# Patient Record
Sex: Male | Born: 1950
Health system: Southern US, Community
[De-identification: ages and names within clinical notes are randomized; demographics above are authoritative.]

## PROBLEM LIST (undated history)

## (undated) DIAGNOSIS — H9190 Unspecified hearing loss, unspecified ear: Secondary | ICD-10-CM

## (undated) DIAGNOSIS — I8390 Asymptomatic varicose veins of unspecified lower extremity: Secondary | ICD-10-CM

## (undated) DIAGNOSIS — Z9289 Personal history of other medical treatment: Secondary | ICD-10-CM

## (undated) DIAGNOSIS — I10 Essential (primary) hypertension: Secondary | ICD-10-CM

## (undated) DIAGNOSIS — M25519 Pain in unspecified shoulder: Secondary | ICD-10-CM

## (undated) DIAGNOSIS — J189 Pneumonia, unspecified organism: Secondary | ICD-10-CM

## (undated) DIAGNOSIS — E785 Hyperlipidemia, unspecified: Secondary | ICD-10-CM

## (undated) DIAGNOSIS — D179 Benign lipomatous neoplasm, unspecified: Secondary | ICD-10-CM

## (undated) DIAGNOSIS — G8929 Other chronic pain: Secondary | ICD-10-CM

## (undated) DIAGNOSIS — D333 Benign neoplasm of cranial nerves: Secondary | ICD-10-CM

## (undated) HISTORY — DX: Asymptomatic varicose veins of unspecified lower extremity: I83.90

## (undated) HISTORY — DX: Other chronic pain: G89.29

## (undated) HISTORY — DX: Benign lipomatous neoplasm, unspecified: D17.9

## (undated) HISTORY — DX: Pain in unspecified shoulder: M25.519

## (undated) HISTORY — PX: COLONOSCOPY: SHX174

## (undated) HISTORY — PX: WISDOM TOOTH EXTRACTION: SHX21

## (undated) HISTORY — PX: APPENDECTOMY: SHX54

## (undated) HISTORY — DX: Unspecified hearing loss, unspecified ear: H91.90

## (undated) HISTORY — DX: Personal history of other medical treatment: Z92.89

## (undated) HISTORY — DX: Essential (primary) hypertension: I10

---

## 1999-04-01 ENCOUNTER — Encounter: Payer: Self-pay | Admitting: Orthopedic Surgery

## 1999-04-01 ENCOUNTER — Ambulatory Visit (HOSPITAL_COMMUNITY): Admission: RE | Admit: 1999-04-01 | Discharge: 1999-04-01 | Payer: Self-pay | Admitting: Orthopedic Surgery

## 1999-08-17 ENCOUNTER — Emergency Department (HOSPITAL_COMMUNITY): Admission: EM | Admit: 1999-08-17 | Discharge: 1999-08-17 | Payer: Self-pay | Admitting: *Deleted

## 1999-10-21 ENCOUNTER — Emergency Department (HOSPITAL_COMMUNITY): Admission: EM | Admit: 1999-10-21 | Discharge: 1999-10-21 | Payer: Self-pay | Admitting: Emergency Medicine

## 2000-03-15 HISTORY — PX: LUMBAR DISC SURGERY: SHX700

## 2000-10-14 ENCOUNTER — Encounter: Admission: RE | Admit: 2000-10-14 | Discharge: 2000-10-14 | Payer: Self-pay | Admitting: Endocrinology

## 2000-10-14 ENCOUNTER — Encounter: Payer: Self-pay | Admitting: Endocrinology

## 2005-11-01 ENCOUNTER — Ambulatory Visit (HOSPITAL_COMMUNITY): Admission: RE | Admit: 2005-11-01 | Discharge: 2005-11-01 | Payer: Self-pay | Admitting: *Deleted

## 2010-07-31 ENCOUNTER — Inpatient Hospital Stay (INDEPENDENT_AMBULATORY_CARE_PROVIDER_SITE_OTHER)
Admission: RE | Admit: 2010-07-31 | Discharge: 2010-07-31 | Disposition: A | Discharge status: Home / Self Care | Payer: Self-pay | Source: Ambulatory Visit | Attending: Family Medicine | Admitting: Family Medicine

## 2010-07-31 DIAGNOSIS — B356 Tinea cruris: Secondary | ICD-10-CM

## 2010-07-31 DIAGNOSIS — M779 Enthesopathy, unspecified: Secondary | ICD-10-CM

## 2011-04-14 ENCOUNTER — Ambulatory Visit (INDEPENDENT_AMBULATORY_CARE_PROVIDER_SITE_OTHER): Payer: Self-pay | Admitting: Medical

## 2011-04-14 ENCOUNTER — Encounter: Payer: Self-pay | Admitting: Medical

## 2011-04-14 VITALS — BP 110/70 | HR 62 | Temp 98.2°F | Resp 16 | Ht 68.0 in | Wt 198.0 lb

## 2011-04-14 DIAGNOSIS — M79609 Pain in unspecified limb: Secondary | ICD-10-CM

## 2011-04-14 DIAGNOSIS — M79672 Pain in left foot: Secondary | ICD-10-CM | POA: Insufficient documentation

## 2011-04-14 MED ORDER — NAPROXEN SODIUM ER 750 MG PO TB24: 1.0000 | ORAL_TABLET | Freq: Every day | ORAL | Status: DC

## 2011-04-14 NOTE — Progress Notes (Signed)
Subjective:   HPI  Logan Phillips is a 61 y.o. male who presents as a new patient today.  Was seeing [facility] prior but it was too far to keep driving there.  He reports pain of left foot for 3-4 days.  Pain around the bottom of his feet, worse with putting pressure on this or walking.  Pain at night too.  He says it even hurts for sheet to touch the foot.  Denies injury or trauma.  He works in Environmental manager.   His friends who have gout thought this may be gout.  Friend gave him some Hydrocodone to use which helped.  Using Aleve OTC.  Of note, he says he was drinking 8-9 beers nightly for the past 8+months, but after his recent physical 12/12 with elevated liver tests, he cut back ETOH intake to 3-4 beers per day.  Denies prior similar symptoms.  No other aggravating or relieving factors.    No other c/o.  The following portions of the patient's history were reviewed and updated as appropriate: allergies, current medications, past family history, past medical history, past social history, past surgical history and problem list.  Past Medical History  Diagnosis Date  . Hypertension     No Known Allergies  No current outpatient prescriptions on file prior to visit.    Review of Systems Constitutional: denies fever, chills, sweats, unexpected weight change, fatigue Cardiology: denies chest pain, palpitations, edema Respiratory: denies cough, shortness of breath, wheezing Gastroenterology: denies abdominal pain, nausea, vomiting, diarrhea, constipation Musculoskeletal: denies myalgias, joint swelling, back pain Urology: denies dysuria, difficulty urinating, hematuria, urinary frequency, urgency Neurology: no headache, weakness, tingling, numbness      Objective:   Physical Exam  General appearance: alert, no distress, WD/WN Heart: RRR, normal S1, S2, no murmurs Lungs: CTA bilaterally, no wheezes, rhonchi, or rales MSK: left great toe MTP with generalized tenderness, tender  along 1st left metatarsal, but no obvious swelling, no erythema, no bony changes, otherwise foot nontender, rest of LE exam unremarkable Pulses: 2+ symmetric, upper and lower extremities, normal cap refill   Assessment and Plan :     Encounter Diagnosis  Name Primary?  Marland Kitchen Foot pain, left Yes   We discussed his symptoms and exam findings.  He may have gout or pseudogout, but exam not completely specific and he wasn't as tender as would be expected.   Other etiologies could include arthritis, plantar fascitis, etc.  We will treat empirically for gout currently.  Gave samples of Naprelan 875 mg daily for 3 days.  He will call back Friday if not improving.  Consider Indocin, prednisone or other NSAID at that time.  Advised he cut down even more on ETOH use, rest, can use ice if desired.  He signed records release so we can get records from prior [facility].  If he continues to get similar flare ups, we will consider additional workup for gout including xray.

## 2011-04-14 NOTE — Patient Instructions (Signed)
Gout Gout is an inflammatory condition (arthritis) caused by a buildup of uric acid crystals in the joints. Uric acid is a chemical that is normally present in the blood. Under some circumstances, uric acid can form into crystals in your joints. This causes joint redness, soreness, and swelling (inflammation). Repeat attacks are common. Over time, uric acid crystals can form into masses (tophi) near a joint, causing disfigurement. Gout is treatable and often preventable. CAUSES  The disease begins with elevated levels of uric acid in the blood. Uric acid is produced by your body when it breaks down a naturally found substance called purines. This also happens when you eat certain foods such as meats and fish. Causes of an elevated uric acid level include:  Being passed down from parent to child (heredity).   Diseases that cause increased uric acid production (obesity, psoriasis, some cancers).   Excessive alcohol use.   Diet, especially diets rich in meat and seafood.   Medicines, including certain cancer-fighting drugs (chemotherapy), diuretics, and aspirin.   Chronic kidney disease. The kidneys are no longer able to remove uric acid well.   Problems with metabolism.  Conditions strongly associated with gout include:  Obesity.   High blood pressure.   High cholesterol.   Diabetes.  Not everyone with elevated uric acid levels gets gout. It is not understood why some people get gout and others do not. Surgery, joint injury, and eating too much of certain foods are some of the factors that can lead to gout. SYMPTOMS   An attack of gout comes on quickly. It causes intense pain with redness, swelling, and warmth in a joint.   Fever can occur.   Often, only one joint is involved. Certain joints are more commonly involved:   Base of the big toe.   Knee.   Ankle.   Wrist.   Finger.  Without treatment, an attack usually goes away in a few days to weeks. Between attacks, you  usually will not have symptoms, which is different from many other forms of arthritis. DIAGNOSIS  Your caregiver will suspect gout based on your symptoms and exam. Removal of fluid from the joint (arthrocentesis) is done to check for uric acid crystals. Your caregiver will give you a medicine that numbs the area (local anesthetic) and use a needle to remove joint fluid for exam. Gout is confirmed when uric acid crystals are seen in joint fluid, using a special microscope. Sometimes, blood, urine, and X-ray tests are also used. TREATMENT  There are 2 phases to gout treatment: treating the sudden onset (acute) attack and preventing attacks (prophylaxis). Treatment of an Acute Attack  Medicines are used. These include anti-inflammatory medicines or steroid medicines.   An injection of steroid medicine into the affected joint is sometimes necessary.   The painful joint is rested. Movement can worsen the arthritis.   You may use warm or cold treatments on painful joints, depending which works best for you.   Discuss the use of coffee, vitamin C, or cherries with your caregiver. These may be helpful treatment options.  Treatment to Prevent Attacks After the acute attack subsides, your caregiver may advise prophylactic medicine. These medicines either help your kidneys eliminate uric acid from your body or decrease your uric acid production. You may need to stay on these medicines for a very long time. The early phase of treatment with prophylactic medicine can be associated with an increase in acute gout attacks. For this reason, during the first few months   of treatment, your caregiver may also advise you to take medicines usually used for acute gout treatment. Be sure you understand your caregiver's directions. You should also discuss dietary treatment with your caregiver. Certain foods such as meats and fish can increase uric acid levels. Other foods such as dairy can decrease levels. Your caregiver  can give you a list of foods to avoid. HOME CARE INSTRUCTIONS   Do not take aspirin to relieve pain. This raises uric acid levels.   Only take over-the-counter or prescription medicines for pain, discomfort, or fever as directed by your caregiver.   Rest the joint as much as possible. When in bed, keep sheets and blankets off painful areas.   Keep the affected joint raised (elevated).   Use crutches if the painful joint is in your leg.   Drink enough water and fluids to keep your urine clear or pale yellow. This helps your body get rid of uric acid. Do not drink alcoholic beverages. They slow the passage of uric acid.   Follow your caregiver's dietary instructions. Pay careful attention to the amount of protein you eat. Your daily diet should emphasize fruits, vegetables, whole grains, and fat-free or low-fat milk products.   Maintain a healthy body weight.  SEEK MEDICAL CARE IF:   You have an oral temperature above 102 F (38.9 C).   You develop diarrhea, vomiting, or any side effects from medicines.   You do not feel better in 24 hours, or you are getting worse.  SEEK IMMEDIATE MEDICAL CARE IF:   Your joint becomes suddenly more tender and you have:   Chills.   An oral temperature above 102 F (38.9 C), not controlled by medicine.  MAKE SURE YOU:   Understand these instructions.   Will watch your condition.   Will get help right away if you are not doing well or get worse.  Document Released: 02/27/2000 Document Revised: 11/11/2010 Document Reviewed: 06/09/2009 [employer] Patient Information 2012 [employer]. 

## 2011-04-27 ENCOUNTER — Telehealth: Payer: Self-pay | Admitting: Medical

## 2011-04-27 NOTE — Telephone Encounter (Signed)
Needs refill on toprol xl 50 mg and lisinopril 20 mg

## 2011-04-28 ENCOUNTER — Other Ambulatory Visit: Payer: Self-pay | Admitting: Medical

## 2011-04-28 MED ORDER — LISINOPRIL 20 MG PO TABS: 20.0000 mg | ORAL_TABLET | Freq: Every day | ORAL | Status: DC

## 2011-04-28 MED ORDER — METOPROLOL SUCCINATE ER 50 MG PO TB24: 50.0000 mg | ORAL_TABLET | Freq: Every day | ORAL | Status: DC

## 2011-04-28 NOTE — Telephone Encounter (Signed)
i sent toprol, but from his first visit, i don't see where he was on lisinopril.  Pls clarify?

## 2011-04-28 NOTE — Telephone Encounter (Signed)
PATIENT STATES THAT HE DOES TAKE LISNOPRIL 20 MG. HE HAS TAKEN IT FOR YEARS. I PUT HIS CHART IN YOUR OFFICE. CLS

## 2011-05-19 ENCOUNTER — Emergency Department (HOSPITAL_COMMUNITY)
Admission: EM | Admit: 2011-05-19 | Discharge: 2011-05-19 | Disposition: A | Discharge status: Home / Self Care | Payer: Self-pay | Attending: Emergency Medicine | Admitting: Emergency Medicine

## 2011-05-19 ENCOUNTER — Encounter (HOSPITAL_COMMUNITY): Payer: Self-pay | Admitting: Emergency Medicine

## 2011-05-19 ENCOUNTER — Emergency Department (HOSPITAL_COMMUNITY): Payer: Self-pay

## 2011-05-19 DIAGNOSIS — M79609 Pain in unspecified limb: Secondary | ICD-10-CM | POA: Insufficient documentation

## 2011-05-19 DIAGNOSIS — R209 Unspecified disturbances of skin sensation: Secondary | ICD-10-CM | POA: Insufficient documentation

## 2011-05-19 DIAGNOSIS — M542 Cervicalgia: Secondary | ICD-10-CM | POA: Insufficient documentation

## 2011-05-19 DIAGNOSIS — I10 Essential (primary) hypertension: Secondary | ICD-10-CM | POA: Insufficient documentation

## 2011-05-19 DIAGNOSIS — Z79899 Other long term (current) drug therapy: Secondary | ICD-10-CM | POA: Insufficient documentation

## 2011-05-19 DIAGNOSIS — IMO0002 Reserved for concepts with insufficient information to code with codable children: Secondary | ICD-10-CM | POA: Insufficient documentation

## 2011-05-19 MED ORDER — HYDROCODONE-ACETAMINOPHEN 5-500 MG PO TABS: 1.0000 | ORAL_TABLET | Freq: Four times a day (QID) | ORAL | Status: AC | PRN

## 2011-05-19 MED ORDER — IBUPROFEN 600 MG PO TABS: 600.0000 mg | ORAL_TABLET | Freq: Four times a day (QID) | ORAL | Status: DC | PRN

## 2011-05-19 MED ORDER — TRAMADOL HCL 50 MG PO TABS: 50.0000 mg | ORAL_TABLET | Freq: Four times a day (QID) | ORAL | Status: AC | PRN

## 2011-05-19 MED ORDER — PREDNISONE 20 MG PO TABS: ORAL_TABLET | ORAL | Status: AC

## 2011-05-19 NOTE — ED Notes (Signed)
Patient transported to MRI 

## 2011-05-19 NOTE — Discharge Instructions (Signed)
Avoid heavy lifting and/or other activities that exacerbate your symptoms. Take prednisone as prescribed.   You may take ultram as need for pain - no driving when taking.  Follow up with your doctor/specialist in coming week - discuss mri results with them (MRI results below). Also follow up with primary care doctor in 1 week for recheck of blood pressure as it is high today.   Return to ER if worse, arm numbness/weakness, intractable pain, other concern.       MR Cervical Spine Wo Contrast (Final result)   Result time:05/19/11 1003    Final result by Rad Results In Interface (05/19/11 10:03:37)    Narrative:   *RADIOLOGY REPORT*  Clinical Data: 61 year old male with neck pain, right arm numbness and tingling.  MRI CERVICAL SPINE WITHOUT CONTRAST  Technique: Multiplanar and multiecho pulse sequences of the cervical spine, to include the craniocervical junction and cervicothoracic junction, were obtained according to standard protocol without intravenous contrast.  Comparison: None.  Findings: Cervicomedullary junction is within normal limits. Straightening of cervical lordosis. No marrow edema or evidence of acute osseous abnormality. Visualized paraspinal soft tissues are within normal limits. Spinal cord signal is within normal limits at all visualized levels.  C2-C3: Negative.  C3-C4: Mild facet and uncovertebral hypertrophy. Minimal disc bulge. No spinal stenosis. Mild bilateral C4 foraminal stenosis.  C4-C5: Mild facet hypertrophy greater on the left. Mild circumferential disc osteophyte complex. No significant spinal stenosis. Moderate left and minimal to mild right C5 foraminal stenosis.  C5-C6: Right eccentric circumferential disc osteophyte complex. Mild facet and moderate to severe right uncovertebral hypertrophy. No significant spinal stenosis. Severe right and mild left C6 foraminal stenosis.  C6-C7: Right eccentric circumferential disc osteophyte  complex. Suggestion of superimposed more high signal disc material (series 5 image 17) extending toward the right C7 neural foramen. Facet and uncovertebral hypertrophy. Ligament flavum hypertrophy. Borderline spinal stenosis. Moderate left and severe right C7 foraminal stenosis.  C7-T1: Moderate facet hypertrophy. Negative disc. No significant stenosis.  T1-T2: Moderate facet hypertrophy. Mild bilateral T1 foraminal stenosis.  IMPRESSION: Cervical degenerative changes are largely age congruent. Chronic C5-C6 and C6-C7 disc osteophyte complex with only borderline spinal stenosis, but there is severe multifactorial right C6 and C7 foraminal stenosis. At C6-C7 there is suggestion of a more acute right foraminal disc protrusion (series 5 image 17).  Original Report Authenticated By: Harley Hallmark, M.D.        Degenerative Disc Disease Degenerative disc disease is a condition caused by the changes that occur in the cushions of the backbone (spinal discs) as you grow older. Spinal discs are soft and compressible discs located between the bones of the spine (vertebrae). They act like shock absorbers. Degenerative disc disease can affect the wholespine. However, the neck and lower back are most commonly affected. Many changes can occur in the spinal discs with aging, such as:  The spinal discs may dry and shrink.   Small tears may occur in the tough, outer covering of the disc (annulus).   The disc space may become smaller due to loss of water.   Abnormal growths in the bone (spurs) may occur. This can put pressure on the nerve roots exiting the spinal canal, causing pain.   The spinal canal may become narrowed.  CAUSES  Degenerative disc disease is a condition caused by the changes that occur in the spinal discs with aging. The exact cause is not known, but there is a genetic basis for many patients. Degenerative changes can  occur due to loss of fluid in the disc. This makes the  disc thinner and reduces the space between the backbones. Small cracks can develop in the outer layer of the disc. This can lead to the breakdown of the disc. You are more likely to get degenerative disc disease if you are overweight. Smoking cigarettes and doing heavy work such as weightlifting can also increase your risk of this condition. Degenerative changes can start after a sudden injury. Growth of bone spurs can compress the nerve roots and cause pain.  SYMPTOMS  The symptoms vary from person to person. Some people may have no pain, while others have severe pain. The pain may be so severe that it can limit your activities. The location of the pain depends on the part of your backbone that is affected. You will have neck or arm pain if a disc in the neck area is affected. You will have pain in your back, buttocks, or legs if a disc in the lower back is affected. The pain becomes worse while bending, reaching up, or with twisting movements. The pain may start gradually and then get worse as time passes. It may also start after a major or minor injury. You may feel numbness or tingling in the arms or legs.  DIAGNOSIS  Your caregiver will ask you about your symptoms and about activities or habits that may cause the pain. He or she may also ask about any injuries, diseases, ortreatments you have had earlier. Your caregiver will examine you to check for the range of movement that is possible in the affected area, to check for strength in your extremities, and to check for sensation in the areas of the arms and legs supplied by different nerve roots. An X-ray of the spine may be taken. Your caregiver may suggest other imaging tests, such as a computerized magnetic scan (MRI), if needed.  TREATMENT  Treatment includes rest, modifying your activities, and applying ice and heat. Your caregiver may prescribe medicines to reduce your pain and may ask you to do some exercises to strengthen your back. In some cases,  you may need surgery. You and your caregiver will decide on the treatment that is best for you. HOME CARE INSTRUCTIONS   Follow proper lifting and walking techniques as advised by your caregiver.   Maintain good posture.   Exercise regularly as advised.   Perform relaxation exercises.   Change your sitting, standing, and sleeping habits as advised. Change positions frequently.   Lose weight as advised.   Stop smoking if you smoke.   Wear supportive footwear.  SEEK MEDICAL CARE IF:  The pain does not go away within 1 to 4 weeks. SEEK IMMEDIATE MEDICAL CARE IF:   The pain is severe.   You notice weakness in your arms, hands, or legs.   You begin to lose control of your bladder or bowel.  MAKE SURE YOU:   Understand these instructions.   Will watch your condition.   Will get help right away if you are not doing well or get worse.  Document Released: 12/27/2006 Document Revised: 02/18/2011 Document Reviewed: 12/27/2006 [facility] Patient Information 2012 [place], [place].

## 2011-05-19 NOTE — ED Provider Notes (Addendum)
History     CSN: [id number]  Arrival date & time 05/19/11  0830   First MD Initiated Contact with Patient 05/19/11 [phone]      Chief Complaint  Patient presents with  . Arm Pain    (Consider location/radiation/quality/duration/timing/severity/associated sxs/prior treatment) Patient is a 61 y.o. male presenting with arm pain. The history is provided by the patient.  Arm Pain Pertinent negatives include no chest pain and no shortness of breath.  pt c/o pain right trapezius area, radiating to right shoulder and arm in course of past several months. Says had previously. Was told ddd, nerve impingement. Has been to [facility], and was told he needed an MRI. Pt denies any acute or abrupt worsening of pain today or in past few days. No injury. Occasionally tingling sensation to arm, none currently. No loss of sensation or weakness. States excessive use of arm/shoulder makes pain worse, including bowling. States in past w physical therapy, injections symptoms resolved, but now more persistent. No fever/chills.   Past Medical History  Diagnosis Date  . Hypertension     Past Surgical History  Procedure Date  . Appendectomy   . Wisdom tooth extraction   . Colonoscopy     age 35, repeat 10 years    No family history on file.  History  Substance Use Topics  . Smoking status: Never Smoker   . Smokeless tobacco: Not on file  . Alcohol Use: 12.6 oz/week    21 Cans of beer per week      Review of Systems  Constitutional: Negative for fever.  Respiratory: Negative for shortness of breath.   Cardiovascular: Negative for chest pain.  Musculoskeletal: Negative for joint swelling.  Skin: Negative for rash.  Neurological: Negative for weakness and numbness.    Allergies  Review of patient's allergies indicates no known allergies.  Home Medications   Current Outpatient Rx  Name Route Sig Dispense Refill  . LISINOPRIL 20 MG PO TABS Oral Take 1 tablet (20 mg total) by mouth daily.  30 tablet 2  . METOPROLOL SUCCINATE ER 50 MG PO TB24 Oral Take 1 tablet (50 mg total) by mouth daily. Take with or immediately following a meal. 30 tablet 2  . NAPROXEN SODIUM ER 750 MG PO TB24 Oral Take 1 tablet (750 mg total) by mouth daily. 3 each 0    BP 166/95  Pulse 72  Temp(Src) 97.9 F (36.6 C) (Oral)  Resp 18  Ht 5\' 7"  (1.702 m)  Wt 190 lb (86.183 kg)  BMI 29.76 kg/m2  SpO2 100%  Physical Exam  Nursing note and vitals reviewed. Constitutional: He is oriented to person, place, and time. He appears well-developed and well-nourished. No distress.  HENT:  Head: Atraumatic.  Eyes: Pupils are equal, round, and reactive to light.  Neck: Normal range of motion. Neck supple. No tracheal deviation present.  Cardiovascular: Normal rate.   Pulmonary/Chest: Effort normal. No accessory muscle usage. No respiratory distress.  Musculoskeletal: Normal range of motion. He exhibits no edema and no tenderness.       Full rom at right shoulder and elbow without pain. No swelling. Radial pulse 2+. C/t spine non tender, aligned, no step off.   Neurological: He is alert and oriented to person, place, and time.       Motor intact RUE.   Skin: Skin is warm and dry.  Psychiatric: He has a normal mood and affect.    ED Course  Procedures (including critical care time)   No  results found for this or any previous visit. Mr Cervical Spine Wo Contrast  05/19/2011  *RADIOLOGY REPORT*  Clinical Data: 61 year old male with neck pain, right arm numbness and tingling.  MRI CERVICAL SPINE WITHOUT CONTRAST  Technique:  Multiplanar and multiecho pulse sequences of the cervical spine, to include the craniocervical junction and cervicothoracic junction, were obtained according to standard protocol without intravenous contrast.  Comparison: None.  Findings: Cervicomedullary junction is within normal limits. Straightening of cervical lordosis. No marrow edema or evidence of acute osseous abnormality.  Visualized  paraspinal soft tissues are within normal limits.  Spinal cord signal is within normal limits at all visualized levels.  C2-C3:  Negative.  C3-C4:  Mild facet and uncovertebral hypertrophy.  Minimal disc bulge.  No spinal stenosis.  Mild bilateral C4 foraminal stenosis.  C4-C5:  Mild facet hypertrophy greater on the left.  Mild circumferential disc osteophyte complex.  No significant spinal stenosis.  Moderate left and minimal to mild right C5 foraminal stenosis.  C5-C6:  Right eccentric circumferential disc osteophyte complex. Mild facet and moderate to severe right uncovertebral hypertrophy. No significant spinal stenosis.  Severe right and mild left C6 foraminal stenosis.  C6-C7:  Right eccentric circumferential disc osteophyte complex. Suggestion of superimposed more high signal disc material (series 5 image 17) extending toward the right C7 neural foramen.  Facet and uncovertebral hypertrophy.  Ligament flavum hypertrophy. Borderline spinal stenosis.  Moderate left and severe right C7 foraminal stenosis.  C7-T1:  Moderate facet hypertrophy.  Negative disc.  No significant stenosis.  T1-T2:  Moderate facet hypertrophy.  Mild bilateral T1 foraminal stenosis.  IMPRESSION: Cervical degenerative changes are largely age congruent.  Chronic C5-C6 and C6-C7 disc osteophyte complex with only borderline spinal stenosis, but there is severe multifactorial right C6 and C7 foraminal stenosis. At C6-C7 there is suggestion of a more acute right foraminal disc protrusion (series 5 image 17).  Original Report Authenticated By: Harley Hallmark, M.D.     MDM  Pt requests mri.    Discussed tx options w pt, incl pred/steroid taper, discussed mri results.  Pt now states does want to try prednisone, denies prior allergy to same. Will give pred taper and vicodin.  Will given pain rx and discussed need to follow up with his specialist re mri results.      Suzi Roots, MD 05/19/11 1008  Suzi Roots, MD 05/19/11  1019

## 2011-05-19 NOTE — ED Notes (Signed)
Pt returned from MRI °

## 2011-05-19 NOTE — ED Notes (Addendum)
Pt states he has a pinched nerve and is having pain in his right hand.  Pt states he has been seen by his PCP for same and was told he needed an MRI and epidural. Pt states he can't afford to have them through is PCP so he came here to have them done.

## 2012-03-17 ENCOUNTER — Other Ambulatory Visit: Payer: Self-pay | Admitting: Medical

## 2012-03-17 MED ORDER — METOPROLOL TARTRATE 50 MG PO TABS: 50.0000 mg | ORAL_TABLET | Freq: Once | ORAL | Status: DC

## 2012-03-17 MED ORDER — LISINOPRIL 20 MG PO TABS: 20.0000 mg | ORAL_TABLET | Freq: Every day | ORAL | Status: DC

## 2012-03-17 NOTE — Telephone Encounter (Signed)
I sent the patients medication refills to the pharmacy. CLS

## 2012-03-20 ENCOUNTER — Telehealth: Payer: Self-pay | Admitting: Internal Medicine

## 2012-03-20 MED ORDER — METOPROLOL TARTRATE 50 MG PO TABS: 50.0000 mg | ORAL_TABLET | Freq: Once | ORAL | Status: DC

## 2012-03-20 NOTE — Telephone Encounter (Signed)
Pt called and [facility] did not get metoprolol 50mg  so i resent [facility] in to [facility] [address] while pt was on the phone.

## 2012-03-21 ENCOUNTER — Telehealth: Payer: Self-pay | Admitting: Internal Medicine

## 2012-03-21 ENCOUNTER — Telehealth: Payer: Self-pay | Admitting: Medical

## 2012-03-21 MED ORDER — LISINOPRIL 20 MG PO TABS: 20.0000 mg | ORAL_TABLET | Freq: Every day | ORAL | Status: DC

## 2012-03-21 NOTE — Telephone Encounter (Signed)
Patient called re metoprolol, stating that [facility] [address] got Rx but no directions. I called [facility] [phone] to give them directions but pharmacist needs to verify of metoprolol XL or plain. It is her understanding that patient wants to switch to plain due to cost but it is not clear in chart to me, so can someone please call [facility] [address] and clarify this and note in chart. Patient states he has had to make several trips to pharmacy already

## 2012-03-21 NOTE — Telephone Encounter (Signed)
Patient is aware and he has an appointment scheduled for 04/10/12. CLS

## 2012-03-21 NOTE — Telephone Encounter (Signed)
Refill request for lisinopril 20mg  sent to [facility] [address]

## 2012-03-21 NOTE — Telephone Encounter (Signed)
Prior notes in chart suggest the metoprolol XL once daily, so you can call out 30 days but that is all.  Looking back, we haven't seen him in a year, and it wasn't for BP.   He needs an OV.  i'm not sure why the lisinopril was sent out without an OV reminder.

## 2012-03-23 ENCOUNTER — Encounter: Payer: Self-pay | Admitting: Internal Medicine

## 2012-04-10 ENCOUNTER — Encounter: Payer: Self-pay | Admitting: Medical

## 2012-04-11 ENCOUNTER — Encounter: Payer: Self-pay | Admitting: Medical

## 2012-04-11 ENCOUNTER — Ambulatory Visit (INDEPENDENT_AMBULATORY_CARE_PROVIDER_SITE_OTHER): Payer: Self-pay | Admitting: Medical

## 2012-04-11 VITALS — BP 118/82 | HR 68 | Temp 98.3°F | Resp 16 | Wt 172.0 lb

## 2012-04-11 DIAGNOSIS — Z125 Encounter for screening for malignant neoplasm of prostate: Secondary | ICD-10-CM

## 2012-04-11 DIAGNOSIS — I10 Essential (primary) hypertension: Secondary | ICD-10-CM

## 2012-04-11 DIAGNOSIS — H9319 Tinnitus, unspecified ear: Secondary | ICD-10-CM

## 2012-04-11 DIAGNOSIS — H9313 Tinnitus, bilateral: Secondary | ICD-10-CM

## 2012-04-11 DIAGNOSIS — F1010 Alcohol abuse, uncomplicated: Secondary | ICD-10-CM

## 2012-04-11 DIAGNOSIS — H9190 Unspecified hearing loss, unspecified ear: Secondary | ICD-10-CM

## 2012-04-11 LAB — CBC WITH DIFFERENTIAL/PLATELET
Basophils Relative: 0 % (ref 0–1)
Eosinophils Relative: 5 % (ref 0–5)
HCT: 46.5 % (ref 39.0–52.0)
Hemoglobin: 16.5 g/dL (ref 13.0–17.0)
MCH: 32.5 pg (ref 26.0–34.0)
MCHC: 35.5 g/dL (ref 30.0–36.0)
MCV: 91.5 fL (ref 78.0–100.0)
Monocytes Absolute: 0.7 10*3/uL (ref 0.1–1.0)
Monocytes Relative: 8 % (ref 3–12)
Neutro Abs: 5.8 10*3/uL (ref 1.7–7.7)
RDW: 13.5 % (ref 11.5–15.5)

## 2012-04-11 MED ORDER — HYDROCHLOROTHIAZIDE 25 MG PO TABS: 25.0000 mg | ORAL_TABLET | Freq: Every day | ORAL | Status: DC

## 2012-04-11 MED ORDER — LISINOPRIL 20 MG PO TABS: 20.0000 mg | ORAL_TABLET | Freq: Every day | ORAL | Status: DC

## 2012-04-11 NOTE — Patient Instructions (Signed)
Continue Lisinopril 20 mg daily.   Cut your Metoprolol 50mg  in half, and just take 1/2 tablet daily or 25mg  daily  I am going to have you start a fluid pill, Hydrochlorothiazide 25mg  daily for both inner ear issues and blood pressure.   Lets recheck in 1 months to see if this is helping the ear issues as well as recheck on the blood pressure.   We are checking labs today.  Consider significantly cutting back on your alcohol consumption.

## 2012-04-11 NOTE — Progress Notes (Signed)
Subjective: Logan Phillips is a 62yo white male with a history of hypertension here for med check.  He is compliant with medication.  He does not check his BPs.  He is a nonsmoker, no hx/o high cholesterol.  He exercises dairy, works in Therapist, music care.  Diet is healthy.   Eats chicken, protein shakes, vegetables, avoids fast foods, takes multivitamin.   He reports few months of intermittent ear pressure, R>L, but bilat.  Cleans ears daily with qtips, but no liquids placed in ear.  He also notes some runny nose.  Denies fever, chills, changes in vision.  He does report ringing in ears "forever".  Hearing is intermittently muffled.  Denies no cough, no sinus pressure, no sore throat, no post nasal drip.  Doesn't feel sick.  No dizziness.  Denies numbness, tingling.  He denies hx/o recurrent ear or sinus infection.   He has issues with sleep.  Gets to sleep ok, but wakes up every 3-4 hours.  Has had this pattern for a few years.  Has to urinate 1-2 x nightly.    Past Medical History  Diagnosis Date  . Hypertension   . DTD (diastrophic dysplasia)   . Varicose vein    Review of Systems as in subjective  Objective: Filed Vitals:   04/11/12 0933  BP: 118/82  Pulse: 68  Temp: 98.3 F (36.8 C)  Resp: 16    General appearance: alert, no distress, WD/WN, lean white male HEENT: normocephalic, sclerae anicteric, TMs pearly, nares patent, no discharge or erythema, pharynx normal Oral cavity: MMM, no lesions Neck: supple, no lymphadenopathy, no thyromegaly, no masses Heart: RRR, normal S1, S2, no murmurs Lungs: CTA bilaterally, no wheezes, rhonchi, or rales Abdomen: +bs, soft, non tender, non distended, no masses, no hepatomegaly, no splenomegaly Pulses: 2+ symmetric, upper and lower extremities, normal cap refill Dre: anus normal, prostate WNL, no nodules, occult negative stool  Assessment: Encounter Diagnoses  Name Primary?  . Essential hypertension, benign Yes  . Tinnitus of both ears   .  Disturbed hearing   . Excessive drinking alcohol   . Screening PSA (prostate specific antigen)     Plan: HTN - c/t same medication.  Labs today, fasting.  Will cut down on his Lopressor temporarily, c/t Lisinopril, add HCTZ for both HTN and possible meniere.  F/u 13mo.  Tinnitus, disturbed hearing - possible meniere.  Begin HCTZ as trial.  He had recent hearing exam with work, has +hearing loss, also works with loud equipment, advised hearing protection.   excessive ETOH intake.  Discussed his alcohol use, health risks, advised he cut down on his alcohol intake.  Consider AA.  PSA - screening.  Follow-up pending labs

## 2012-04-12 LAB — LIPID PANEL
Cholesterol: 186 mg/dL (ref 0–200)
LDL Cholesterol: 100 mg/dL — ABNORMAL HIGH (ref 0–99)
VLDL: 20 mg/dL (ref 0–40)

## 2012-04-12 LAB — COMPREHENSIVE METABOLIC PANEL
Albumin: 5.1 g/dL (ref 3.5–5.2)
BUN: 20 mg/dL (ref 6–23)
CO2: 31 mEq/L (ref 19–32)
Glucose, Bld: 109 mg/dL — ABNORMAL HIGH (ref 70–99)
Sodium: 136 mEq/L (ref 135–145)
Total Bilirubin: 1.3 mg/dL — ABNORMAL HIGH (ref 0.3–1.2)
Total Protein: 7.6 g/dL (ref 6.0–8.3)

## 2012-04-12 LAB — FREE THYROXINE INDEX: Free Thyroxine Index: 2.1 (ref 1.0–3.9)

## 2012-04-12 LAB — TSH: TSH: 0.691 u[IU]/mL (ref 0.350–4.500)

## 2012-04-12 LAB — PSA: PSA: 1.91 ng/mL (ref <=4.00)

## 2012-04-12 LAB — T3 UPTAKE: T3 Uptake: 28.9 % (ref 22.5–37.0)

## 2012-10-30 ENCOUNTER — Other Ambulatory Visit: Payer: Self-pay | Admitting: Medical

## 2012-11-30 ENCOUNTER — Telehealth: Payer: Self-pay | Admitting: Medical

## 2012-11-30 ENCOUNTER — Other Ambulatory Visit: Payer: Self-pay | Admitting: Medical

## 2012-11-30 MED ORDER — METOPROLOL TARTRATE 50 MG PO TABS: ORAL_TABLET | ORAL | Status: DC

## 2012-11-30 NOTE — Telephone Encounter (Signed)
Refill request for lopressor 50mg  #30 to [facility] on [address]

## 2012-11-30 NOTE — Telephone Encounter (Signed)
SENT MED IN 

## 2013-01-02 ENCOUNTER — Telehealth: Payer: Self-pay | Admitting: Internal Medicine

## 2013-01-02 ENCOUNTER — Other Ambulatory Visit: Payer: Self-pay | Admitting: Family Medicine

## 2013-01-02 ENCOUNTER — Other Ambulatory Visit: Payer: Self-pay | Admitting: Medical

## 2013-01-02 MED ORDER — LISINOPRIL 20 MG PO TABS: 20.0000 mg | ORAL_TABLET | Freq: Every day | ORAL | Status: DC

## 2013-01-02 NOTE — Telephone Encounter (Signed)
Refill request for lisinopril 20mg  to [facility]

## 2013-01-02 NOTE — Telephone Encounter (Signed)
Rx refill was sent to his pharmacy. CLS 

## 2013-02-04 ENCOUNTER — Other Ambulatory Visit: Payer: Self-pay | Admitting: Medical

## 2013-03-21 ENCOUNTER — Encounter: Payer: Self-pay | Admitting: Medical

## 2013-03-26 ENCOUNTER — Ambulatory Visit (INDEPENDENT_AMBULATORY_CARE_PROVIDER_SITE_OTHER): Payer: Self-pay | Admitting: Family Medicine

## 2013-03-26 ENCOUNTER — Encounter: Payer: Self-pay | Admitting: Family Medicine

## 2013-03-26 VITALS — BP 116/74 | HR 68 | Temp 98.1°F | Ht 68.0 in | Wt 174.0 lb

## 2013-03-26 DIAGNOSIS — J069 Acute upper respiratory infection, unspecified: Secondary | ICD-10-CM

## 2013-03-26 NOTE — Patient Instructions (Signed)
Drink plenty of fluids. Rest. If your cough should return, use Mucinex DM or Robitussin. Use decongestants cautiously, monitoring blood pressure (only if needed for sinus pain). Sinuses rinses can also help with sinus pain. Nasal saline will help keep the nose moist and prevent bleeds.  Call later this week (or early next week) if recurrent fevers, discolored mucus, or worsening symptoms for antibiotics to be called to your pharmacy.   Return if you develop shortness of breath, chest pain, or other new symptoms or other concerns.

## 2013-03-26 NOTE — Progress Notes (Signed)
Chief Complaint  Patient presents with  . Cough    since this past Thursday, two other roomates had same cold. Has had chills, fever and body aches.  States he does not have insurance, and really doesn't think he has the flu so I have not tested him as of yet.    4 days ago he started with head congestion, cough, sneezing, runny nose.  Ribs and back hurt from coughing so much.  Head was so congested it hurt to open his eyes.  He started feeling better 2-3 days later (coughing and head congestion had improved).  Yesterday he started with fever to 100.0 and sore throat. Last night he had some sweats.  His sore throat is very mild today, and no fever yet today.  He is feeling very fatigued, no energy to go to the gym like he normally does.  Cough is much better.  He used Nyquil at night to sleep, and a decongestant during the day, with some improvement.  He hasn't needed any OTC medications in the last couple of days.  He had some aspirin earlier today.  Temp was 98.7 before taking aspirin today.  Today nasal mucus is clear.  Roommates have been sick--one with similar symptoms (although no fever), and the other with vomiting, and other friends with similar illness.  Past Medical History  Diagnosis Date  . Hypertension   . DTD (diastrophic dysplasia)   . Varicose vein    Past Surgical History  Procedure Laterality Date  . Appendectomy    . Wisdom tooth extraction    . Colonoscopy      age 77, repeat 31 years   History   Social History  . Marital Status: Divorced    Spouse Name: N/A    Number of Children: N/A  . Years of Education: N/A   Occupational History  . Not on file.   Social History Main Topics  . Smoking status: Never Smoker   . Smokeless tobacco: Never Used  . Alcohol Use: 12.6 oz/week    21 Cans of beer per week     Comment: none since 03/15/13  . Drug Use: No  . Sexual Activity: Not on file   Other Topics Concern  . Not on file   Social History Narrative   Works  in [employer]   Current outpatient prescriptions:fish oil-omega-3 fatty acids 1000 MG capsule, Take 1 g by mouth daily., Disp: , Rfl: ;  hydrochlorothiazide (HYDRODIURIL) 25 MG tablet, Take 1 tablet (25 mg total) by mouth daily., Disp: 30 tablet, Rfl: 5;  lisinopril (PRINIVIL,ZESTRIL) 20 MG tablet, Take 1 tablet (20 mg total) by mouth daily., Disp: 30 tablet, Rfl: 5;  metoprolol (LOPRESSOR) 50 MG tablet, TAKE ONE TABLET BY MOUTH ONCE DAILY, Disp: 30 tablet, Rfl: 4 Multiple Vitamin (MULITIVITAMIN WITH MINERALS) TABS, Take 1 tablet by mouth daily., Disp: , Rfl:   No Known Allergies  ROS: see HPI.  Denies dizziness, chest pain, palpitations, nausea, vomiting, diarrhea, bleeding, bruising, skin rash, or other concerns.  No shortness of breath.  PHYSICAL EXAM: BP 116/74  Pulse 68  Temp(Src) 98.1 F (36.7 C) (Oral)  Ht 5\' 8"  (1.727 m)  Wt 174 lb (78.926 kg)  BMI 26.46 kg/m2 Well appearing male in no distress.  No coughing, sniffling or sneezing HEENT:  PERRL, EOMI, conjunctiva clear.  TM's and EAC's normal.  Nasal mucosa mildly edematous, recent bleeding on right. No purulence.  Sinuses nontender.  OP clear, mild erythema posteriorly Neck: no lymphadenopathy or  mass Heart: regular rate and rhythm without murmur Lungs: clear bilaterally with good air movement Skin: no rashes  ASSESSMENT/PLAN:  Acute upper respiratory infections of unspecified site  URI--supportive measures. Call later this week (or early next week) if recurrent fevers, discolored mucus, worsening symptoms for antibiotics.   Return if shortness of breath, new symptoms or other concerns.

## 2013-03-30 ENCOUNTER — Telehealth: Payer: Self-pay | Admitting: Internal Medicine

## 2013-03-30 MED ORDER — AMOXICILLIN 875 MG PO TABS: 875.0000 mg | ORAL_TABLET | Freq: Two times a day (BID) | ORAL | Status: DC

## 2013-03-30 MED ORDER — AZITHROMYCIN 500 MG PO TABS: 500.0000 mg | ORAL_TABLET | Freq: Every day | ORAL | Status: DC

## 2013-03-30 NOTE — Telephone Encounter (Signed)
Pt called stating that he is still not feeling well. Still has a sore throat, coughing and not a lot of energy. Please call in something to [facility] [address]

## 2013-03-30 NOTE — Telephone Encounter (Signed)
Let him know that I called an antibiotic in 

## 2013-03-30 NOTE — Telephone Encounter (Signed)
done

## 2013-03-30 NOTE — Telephone Encounter (Signed)
LMTCB

## 2013-03-30 NOTE — Telephone Encounter (Signed)
Azithromycin called in for continued difficulty with symptoms.

## 2013-03-30 NOTE — Addendum Note (Signed)
Addended by: Denita Lung on: 03/30/2013 04:39 PM   Modules accepted: Orders

## 2013-04-23 ENCOUNTER — Encounter: Payer: Self-pay | Admitting: Medical

## 2013-06-11 ENCOUNTER — Encounter: Payer: Self-pay | Admitting: Medical

## 2013-07-03 ENCOUNTER — Telehealth: Payer: Self-pay | Admitting: Medical

## 2013-07-03 ENCOUNTER — Ambulatory Visit (INDEPENDENT_AMBULATORY_CARE_PROVIDER_SITE_OTHER): Payer: BC Managed Care – PPO | Admitting: Medical

## 2013-07-03 ENCOUNTER — Encounter: Payer: Self-pay | Admitting: Medical

## 2013-07-03 VITALS — BP 130/90 | HR 58 | Temp 97.9°F | Resp 14 | Ht 67.0 in | Wt 181.0 lb

## 2013-07-03 DIAGNOSIS — R9431 Abnormal electrocardiogram [ECG] [EKG]: Secondary | ICD-10-CM

## 2013-07-03 DIAGNOSIS — I10 Essential (primary) hypertension: Secondary | ICD-10-CM

## 2013-07-03 DIAGNOSIS — I8390 Asymptomatic varicose veins of unspecified lower extremity: Secondary | ICD-10-CM

## 2013-07-03 DIAGNOSIS — K029 Dental caries, unspecified: Secondary | ICD-10-CM

## 2013-07-03 DIAGNOSIS — Z0000 Encounter for general adult medical examination without abnormal findings: Secondary | ICD-10-CM

## 2013-07-03 DIAGNOSIS — M25569 Pain in unspecified knee: Secondary | ICD-10-CM

## 2013-07-03 DIAGNOSIS — R7301 Impaired fasting glucose: Secondary | ICD-10-CM

## 2013-07-03 DIAGNOSIS — F1010 Alcohol abuse, uncomplicated: Secondary | ICD-10-CM

## 2013-07-03 DIAGNOSIS — M25561 Pain in right knee: Secondary | ICD-10-CM

## 2013-07-03 DIAGNOSIS — L989 Disorder of the skin and subcutaneous tissue, unspecified: Secondary | ICD-10-CM

## 2013-07-03 LAB — COMPREHENSIVE METABOLIC PANEL
ALT: 28 U/L (ref 0–53)
AST: 30 U/L (ref 0–37)
Albumin: 4.6 g/dL (ref 3.5–5.2)
Alkaline Phosphatase: 57 U/L (ref 39–117)
BILIRUBIN TOTAL: 0.9 mg/dL (ref 0.2–1.2)
BUN: 16 mg/dL (ref 6–23)
CALCIUM: 9.6 mg/dL (ref 8.4–10.5)
CHLORIDE: 102 meq/L (ref 96–112)
CO2: 28 meq/L (ref 19–32)
CREATININE: 0.98 mg/dL (ref 0.50–1.35)
GLUCOSE: 98 mg/dL (ref 70–99)
Potassium: 4.5 mEq/L (ref 3.5–5.3)
Sodium: 139 mEq/L (ref 135–145)
Total Protein: 7.2 g/dL (ref 6.0–8.3)

## 2013-07-03 LAB — POCT URINALYSIS DIPSTICK
Bilirubin, UA: NEGATIVE
Glucose, UA: NEGATIVE
KETONES UA: NEGATIVE
LEUKOCYTES UA: NEGATIVE
Nitrite, UA: NEGATIVE
PH UA: 5
PROTEIN UA: NEGATIVE
RBC UA: NEGATIVE
Urobilinogen, UA: NEGATIVE

## 2013-07-03 LAB — CBC
HEMATOCRIT: 43.2 % (ref 39.0–52.0)
Hemoglobin: 15.4 g/dL (ref 13.0–17.0)
MCH: 31.6 pg (ref 26.0–34.0)
MCHC: 35.6 g/dL (ref 30.0–36.0)
MCV: 88.5 fL (ref 78.0–100.0)
Platelets: 157 10*3/uL (ref 150–400)
RBC: 4.88 MIL/uL (ref 4.22–5.81)
RDW: 14.2 % (ref 11.5–15.5)
WBC: 5.9 10*3/uL (ref 4.0–10.5)

## 2013-07-03 MED ORDER — TRAMADOL HCL 50 MG PO TABS: 50.0000 mg | ORAL_TABLET | Freq: Three times a day (TID) | ORAL | Status: DC | PRN

## 2013-07-03 MED ORDER — LISINOPRIL 20 MG PO TABS: 20.0000 mg | ORAL_TABLET | Freq: Every day | ORAL | Status: DC

## 2013-07-03 MED ORDER — METOPROLOL SUCCINATE ER 50 MG PO TB24: 50.0000 mg | ORAL_TABLET | Freq: Every day | ORAL | Status: DC

## 2013-07-03 NOTE — Telephone Encounter (Signed)
I did receive his prior cardiac evaluation.  He had a catheterization in 1995, and although the coronary arteries were clear he did have an enlarged heart.  Since the most recent echocardiogram I have on file was over 15 years ago, I do recommend an updated echocardiogram given his EKG findings and age.  If agreeable please set up echo

## 2013-07-03 NOTE — Patient Instructions (Addendum)
  Thank you for giving me the opportunity to serve you today.    Your diagnosis today includes: Encounter Diagnoses  Name Primary?  . Routine general medical examination at a health care facility Yes  . Essential hypertension, benign   . Excessive drinking alcohol   . Dental caries   . Impaired fasting blood sugar   . Varicose vein of leg   . Skin lesion of back      Specific recommendations today include:  I recommend you see an eye doctor yearly for screening for glaucoma and general eye care  I recommend you see dentist soon. You do have some areas of decay.  Plan to see dermatology for skin lesion of back  Plan to see the vein clinic for left leg varicose pains  We will call with lab results  Continue your current blood pressure medications, Metoprolol 50 mg once daily, lisinopril 20 once daily  I recommend you take a baby aspirin 81 mg daily at bedtime for heart health  I recommend you drink no more than 2 alcohol drinks daily, and don't drink every day.  I recommend the shingles vaccine.  Check your insurance coverage for this.  You'll be due for repeat colonoscopy next year, 2016  Use sun block daily  Return pending labs.

## 2013-07-03 NOTE — Progress Notes (Signed)
Subjective:   HPI  Logan Phillips is a 63 y.o. male who presents for a complete physical.   Preventative care: Last ophthalmology visit:N/A Last dental visit:N/A Last colonoscopy:2006 Last prostate exam:?  Last EKG:YES Last labs:2014  Prior vaccinations: TD or Tdap:PATIENT STATES THAT IT IS UP TO DATE. Influenza:N/A Pneumococcal:N/A Shingles/Zostavax:N/A Other:  N/A Health care power of attorney:N/A Living will:N/A  Concerns: Knees give him problems every now and then.   Is sore in the mornings when he gets out of the bed, takes and hour to get going.   Uses hydrocodone periodically that he gets from friends.  If his knee gets worse he plans to go see his friend Dr. Percell Miller  Varicose veins - wants to have these fixed now that he has insurance.   HTN - compliant with metoprolol 50 mg once daily and Lisinopril 20mg  daily.   Not taking HCTZ.  Doesn't add salt to food. Exercising.   Eats healthy, but drinks "excessive alcohol."  Reviewed their medical, surgical, family, social, medication, and allergy history and updated chart as appropriate.  Past Medical History  Diagnosis Date  . Hypertension   . Varicose vein     Past Surgical History  Procedure Laterality Date  . Appendectomy    . Wisdom tooth extraction    . Colonoscopy      age 8, repeat 10 years  . Lumbar disc surgery  2002    Dr. Rita Ohara    History   Social History  . Marital Status: Divorced    Spouse Name: N/A    Number of Children: N/A  . Years of Education: N/A   Occupational History  . Not on file.   Social History Main Topics  . Smoking status: Never Smoker   . Smokeless tobacco: Never Used  . Alcohol Use: 30.0 oz/week    50 Cans of beer per week  . Drug Use: No  . Sexual Activity: Not on file   Other Topics Concern  . Not on file   Social History Narrative   Works in [employer], walking, golf, [employer], single, 3 children, 3 girls    Family History  Problem Relation Age of Onset   . Hypertension Mother   . Dementia Father   . Parkinsonism Father   . Diabetes Father     early stages  . Cancer Maternal Grandmother     breast  . Heart disease Neg Hx   . Stroke Neg Hx     Current outpatient prescriptions:fish oil-omega-3 fatty acids 1000 MG capsule, Take 1 g by mouth daily., Disp: , Rfl: ;  lisinopril (PRINIVIL,ZESTRIL) 20 MG tablet, Take 1 tablet (20 mg total) by mouth daily., Disp: 30 tablet, Rfl: 5;  metoprolol (LOPRESSOR) 50 MG tablet, TAKE ONE TABLET BY MOUTH ONCE DAILY, Disp: 30 tablet, Rfl: 4;  Multiple Vitamin (MULITIVITAMIN WITH MINERALS) TABS, Take 1 tablet by mouth daily., Disp: , Rfl:  hydrochlorothiazide (HYDRODIURIL) 25 MG tablet, Take 1 tablet (25 mg total) by mouth daily., Disp: 30 tablet, Rfl: 5  No Known Allergies     Review of Systems Constitutional: -fever, -chills, -sweats, -unexpected weight change, -decreased appetite, -fatigue Allergy: -sneezing, -itching, -congestion Dermatology: -changing moles, --rash, -lumps ENT: -runny nose, -ear pain, -sore throat, -hoarseness, -sinus pain, -teeth pain, - ringing in ears, -hearing loss, -nosebleeds Cardiology: -chest pain, -palpitations, -swelling, -difficulty breathing when lying flat, -waking up short of breath Respiratory: -cough, -shortness of breath, -difficulty breathing with exercise or exertion, -wheezing, -coughing up blood Gastroenterology: -  abdominal pain, -nausea, -vomiting, -diarrhea, -constipation, -blood in stool, -changes in bowel movement, -difficulty swallowing or eating Hematology: -bleeding, -bruising  Musculoskeletal: -joint aches, -muscle aches, -joint swelling, -back pain, -neck pain, -cramping, -changes in gait Ophthalmology: denies vision changes, eye redness, itching, discharge Urology: -burning with urination, -difficulty urinating, -blood in urine, -urinary frequency, -urgency, -incontinence Neurology: -headache, -weakness, -tingling, -numbness, -memory loss, -falls,  -dizziness Psychology: -depressed mood, -agitation, -sleep problems     Objective:   Physical Exam  BP 130/90  Pulse 58  Temp(Src) 97.9 F (36.6 C) (Oral)  Resp 14  Ht 5\' 7"  (1.702 m)  Wt 181 lb (82.101 kg)  BMI 28.34 kg/m2  General appearance: alert, no distress, WD/WN, white male Skin: Scattered macules of torso, right upper back with 3 distinct round and slightly raised lesions, somewhat stuck on appearance, right back lesion with irregular brown border HEENT: normocephalic, conjunctiva/corneas normal, sclerae anicteric, PERRLA, EOMi, nares patent, no discharge or erythema, pharynx normal Oral cavity: MMM, tongue normal, teeth - bilateral upper molars with obvious decay, moderate plaque in general Neck: supple, no lymphadenopathy, no thyromegaly, no masses, normal ROM, no bruits Chest: non tender, normal shape and expansion Heart: RRR, normal S1, S2, no murmurs Lungs: CTA bilaterally, no wheezes, rhonchi, or rales Abdomen: +bs, soft, right lower quadrant surgical scar, non tender, non distended, no masses, no hepatomegaly, no splenomegaly, no bruits Back: non tender, normal ROM, no scoliosis Musculoskeletal: upper extremities non tender, no obvious deformity, normal ROM throughout, lower extremities non tender, no obvious deformity, normal ROM throughout Extremities: Left leg with severe large tortuous varicosities, otherwise no edema, no cyanosis, no clubbing Pulses: 2+ symmetric, upper and lower extremities, normal cap refill Neurological: alert, oriented x 3, CN2-12 intact, strength normal upper extremities and lower extremities, sensation normal throughout, DTRs 2+ throughout, no cerebellar signs, gait normal Psychiatric: normal affect, behavior normal, pleasant  GU: normal male external genitalia, circumcised, nontender, no masses, no hernia, no lymphadenopathy Rectal: Anus normal tone, prostate within normal limits, occult negative stool   Adult ECG Report  Indication:  HTN  Rate: 52 bpm  Rhythm: sinus bradycardia  QRS Axis: 51 degrees  PR Interval: 136 ms  QRS Duration: 34ms  QTc: 465ms  Conduction Disturbances: marked T wave depressions lateral precordial leads, ST abnormality   Other Abnormalities: none  Patient's cardiac risk factors are: advanced age (older than 33 for men, 34 for women), hypertension and male gender.  EKG comparison: none  Narrative Interpretation: abnormal EKG, marked T wave inversions, sinus bradycardia      Assessment and Plan :    Encounter Diagnoses  Name Primary?  . Routine general medical examination at a health care facility Yes  . Essential hypertension, benign   . Excessive drinking alcohol   . Dental caries   . Impaired fasting blood sugar   . Varicose vein of leg   . Skin lesion of back   . Nonspecific abnormal electrocardiogram (ECG) (EKG)   . Knee pain, right       Physical exam - discussed healthy lifestyle, diet, exercise, preventative care, vaccinations, and addressed their concerns.  Advised he cut down significantly on alcohol use. His knee was nontender today, but where he describes the pain is the medial collateral ligament.  With as he not use other folks hydrocodone, then use Ultram as needed, over-the-counter analgesics as needed.  If pain worsens may need recheck or go back to Dr. Percell Miller  His EKG today was abnormal, but he reports prior abnormal  EKG, stress test and catheterization possibly 10-15 years ago given abnormal EKG at that time although he was asymptomatic. He says that everything checked out fine.  We will try and obtain a copy of the prior evaluation. He is currently physically active and asymptomatic.  Specific recommendations today include:  I recommend you see an eye doctor yearly for screening for glaucoma and general eye care  I recommend you see dentist soon. You do have some areas of decay.  Plan to see dermatology for skin lesion of back  Plan to see the vein clinic for  left leg varicose pains  We will call with lab results  Continue your current blood pressure medications, Metoprolol 50 mg once daily, lisinopril 20 once daily  I recommend you take a baby aspirin 81 mg daily at bedtime for heart health  I recommend you get the shingles vaccine, check your insurance coverage for co-pay amount.  We can do the vaccine here  You'll be due for repeat colonoscopy next year, 2016  Use sun block daily  Follow-up pending labs.

## 2013-07-03 NOTE — Telephone Encounter (Signed)
If he wants, refer to dermatology and vein specialist

## 2013-07-04 ENCOUNTER — Encounter: Payer: Self-pay | Admitting: Medical

## 2013-07-04 LAB — HEMOGLOBIN A1C
Hgb A1c MFr Bld: 4.7 % (ref <5.7)
MEAN PLASMA GLUCOSE: 88 mg/dL (ref <117)

## 2013-07-04 NOTE — Telephone Encounter (Signed)
Patient states that he will gt back to [place] on the Echocardiogram. CLS

## 2013-07-04 NOTE — Telephone Encounter (Signed)
LMOM TO CB. CLS 

## 2013-07-04 NOTE — Telephone Encounter (Signed)
Who do you recommend for a Vein Specialists?  [facility]

## 2013-07-04 NOTE — Telephone Encounter (Signed)
[facility] clinic in [place]

## 2013-07-04 NOTE — Telephone Encounter (Signed)
I left a message on his voicemail about the vein clinic. CLS

## 2013-07-15 ENCOUNTER — Other Ambulatory Visit: Payer: Self-pay | Admitting: Medical

## 2013-07-23 ENCOUNTER — Encounter: Payer: Self-pay | Admitting: Family Medicine

## 2013-07-23 ENCOUNTER — Ambulatory Visit (INDEPENDENT_AMBULATORY_CARE_PROVIDER_SITE_OTHER): Payer: BC Managed Care – PPO | Admitting: Family Medicine

## 2013-07-23 VITALS — BP 142/90 | HR 80 | Wt 183.0 lb

## 2013-07-23 DIAGNOSIS — M79609 Pain in unspecified limb: Secondary | ICD-10-CM

## 2013-07-23 DIAGNOSIS — M79672 Pain in left foot: Secondary | ICD-10-CM

## 2013-07-23 DIAGNOSIS — I8390 Asymptomatic varicose veins of unspecified lower extremity: Secondary | ICD-10-CM

## 2013-07-23 DIAGNOSIS — M25561 Pain in right knee: Secondary | ICD-10-CM

## 2013-07-23 DIAGNOSIS — M25569 Pain in unspecified knee: Secondary | ICD-10-CM

## 2013-07-23 NOTE — Progress Notes (Signed)
   Subjective:    Patient ID: [name], male    DOB: [birth year], 63 y.o.   MRN: [id number]  HPI Last Wednesday he woke up with left foot pain. He worked a whole day not have much trouble however the next morning he woke up with pain and swelling and was worse. He did use Aleve 2 pills 3 times a day with minimal relief. Today he feels medial right knee pain. He has a history of difficulty with this in the past. No popping, locking, grinding or effusion. He also has a very large bare custody on his left leg he states it is not giving him any difficulty.  Review of Systems     Objective:   Physical Exam Right knee exam shows no effusion. Slight discomfort just proximal to the medial joint line. McMurray's testing causes slight discomfort. Negative anterior drawer. Left foot exam shows no swelling, redness or tenderness. Joints showed no effusion and move normally.       Assessment & Plan:  Left foot pain  Right knee pain  Varicose vein of leg  recommend supportive care for the foot and knee pain with the use of Aleve 2 tablets 2 or 3 times per day. Did mention that he can't put a vein clinic if he would like to potentially get rid of the varicose vein.

## 2013-07-23 NOTE — Patient Instructions (Signed)
Stick with 2 Aleve 3 times a day for the knee and foot pain

## 2013-09-05 ENCOUNTER — Ambulatory Visit (INDEPENDENT_AMBULATORY_CARE_PROVIDER_SITE_OTHER): Payer: BC Managed Care – PPO | Admitting: Medical

## 2013-09-05 ENCOUNTER — Other Ambulatory Visit: Payer: Self-pay | Admitting: Family Medicine

## 2013-09-05 ENCOUNTER — Encounter: Payer: Self-pay | Admitting: Medical

## 2013-09-05 ENCOUNTER — Telehealth: Payer: Self-pay | Admitting: Medical

## 2013-09-05 VITALS — BP 130/80 | HR 62 | Temp 97.6°F | Resp 14 | Wt 185.0 lb

## 2013-09-05 DIAGNOSIS — I10 Essential (primary) hypertension: Secondary | ICD-10-CM

## 2013-09-05 DIAGNOSIS — I8390 Asymptomatic varicose veins of unspecified lower extremity: Secondary | ICD-10-CM

## 2013-09-05 DIAGNOSIS — G4700 Insomnia, unspecified: Secondary | ICD-10-CM

## 2013-09-05 DIAGNOSIS — R9431 Abnormal electrocardiogram [ECG] [EKG]: Secondary | ICD-10-CM

## 2013-09-05 MED ORDER — METOPROLOL SUCCINATE ER 50 MG PO TB24: 50.0000 mg | ORAL_TABLET | Freq: Every day | ORAL | Status: DC

## 2013-09-05 NOTE — Progress Notes (Signed)
    Subjective: Patient is a 63 y.o. male who is presenting for 3 week history of some intermittent cramping in his left leg as well as concerns that he has varicose veins. Patient reports having worsening swelling of what appears to be his veins in his lower left leg for more than 10 years. He has also had intermittent cramping, worse at night and occasionally during the day, ankle swelling. He recently saw a surgeon for consult and had an ultrasound done on his left leg which showed venous insufficiency.  Surgeon at vein clinic fitted him for compression hose which he is wearing.  Doing thigh high.  The surgeon advised the patient to see his PCP for medical management. Patient has never smoked, drinks a 24 pack of beer throughout the week. 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 Denies cardiac conditions but has hypertension, admits compliance with his medications. Denies history of dvt. Denies family history of varicose veins.   He wants to change back to ER Metoprolol, felt like this worked better.     Wants Ambien to help to sleep.  Has trouble awakening after 3-4 hours of being sleep.  The patient denies any other questions or concerns.  ROS as in subjective.   Objective:  BP 130/80  Pulse 62  Temp(Src) 97.6 F (36.4 C) (Oral)  Resp 14  Wt 185 lb (83.915 kg)  General appearance: alert, no distress, WD/WN Oral cavity: MMM, no lesions Neck: supple, no lymphadenopathy, no thyromegaly, no masses Heart: RRR, normal S1, S2, no murmurs Lungs: CTA bilaterally, no wheezes, rhonchi, or rales Abdomen: +bs, soft, non tender, non distended, no masses, no hepatomegaly, no splenomegaly Pulses: 2+ symmetric, upper and lower extremities, normal cap refill Ext: significant LE varicosities,much worse L>R, nontender legs and calve, normal ROM   Assessment:  Encounter Diagnoses  Name Primary?  . Essential hypertension, benign Yes  .  Varicose veins   . Insomnia   . Nonspecific abnormal electrocardiogram (ECG) (EKG)      Plan: HTN - c/t Lisinopril 20mg  daily, change to Toprol XL 50mg  daily  Varicose veins - c/t compression hose fitted by vein clinic, aspirin 81mg  QHS, can use tonic water or OTC Mg/K supplement daily for cramps, f/u with vein clinic as planned in September for vein surgery  Insomnia - discussed sleep hygiene, cut back on alcohol use.  Advised against Ambien.   EKG abnormal - reiterated the recommendation for repeat Echo since last echo >10 years ago, and he has been a somewhat heavy alcohol user and has HTN on medication.  Patient was seen in conjunction with PA student Jaynee Eagles, and I have also evaluated and examined patient, agree with student's notes, student supervised by me.

## 2013-09-05 NOTE — Telephone Encounter (Signed)
pls set up 2D echo for HTN, hx/o abnormal EKG.  Verify his copay for him please.  Can set this up for 70mo out.  No urgency.

## 2013-09-05 NOTE — Telephone Encounter (Signed)
I place the orders in [facility] will call the patient with his appointment. CLS

## 2013-09-10 ENCOUNTER — Telehealth (HOSPITAL_COMMUNITY): Payer: Self-pay | Admitting: *Deleted

## 2013-09-17 ENCOUNTER — Telehealth (HOSPITAL_COMMUNITY): Payer: Self-pay | Admitting: *Deleted

## 2013-09-24 ENCOUNTER — Telehealth (HOSPITAL_COMMUNITY): Payer: Self-pay | Admitting: *Deleted

## 2013-10-01 ENCOUNTER — Encounter: Payer: Self-pay | Admitting: Medical

## 2014-07-08 ENCOUNTER — Telehealth: Payer: Self-pay | Admitting: Medical

## 2014-07-08 ENCOUNTER — Ambulatory Visit (INDEPENDENT_AMBULATORY_CARE_PROVIDER_SITE_OTHER): Payer: 59 | Admitting: Medical

## 2014-07-08 ENCOUNTER — Encounter: Payer: Self-pay | Admitting: Medical

## 2014-07-08 VITALS — BP 138/98 | HR 60 | Temp 98.6°F | Resp 15 | Ht 67.0 in | Wt 182.0 lb

## 2014-07-08 DIAGNOSIS — Z7189 Other specified counseling: Secondary | ICD-10-CM

## 2014-07-08 DIAGNOSIS — I10 Essential (primary) hypertension: Secondary | ICD-10-CM | POA: Diagnosis not present

## 2014-07-08 DIAGNOSIS — F1010 Alcohol abuse, uncomplicated: Secondary | ICD-10-CM

## 2014-07-08 DIAGNOSIS — L989 Disorder of the skin and subcutaneous tissue, unspecified: Secondary | ICD-10-CM | POA: Diagnosis not present

## 2014-07-08 DIAGNOSIS — I8392 Asymptomatic varicose veins of left lower extremity: Secondary | ICD-10-CM | POA: Diagnosis not present

## 2014-07-08 DIAGNOSIS — Z0000 Encounter for general adult medical examination without abnormal findings: Secondary | ICD-10-CM

## 2014-07-08 DIAGNOSIS — Z23 Encounter for immunization: Secondary | ICD-10-CM

## 2014-07-08 DIAGNOSIS — R9431 Abnormal electrocardiogram [ECG] [EKG]: Secondary | ICD-10-CM | POA: Diagnosis not present

## 2014-07-08 DIAGNOSIS — Z1211 Encounter for screening for malignant neoplasm of colon: Secondary | ICD-10-CM

## 2014-07-08 DIAGNOSIS — Z7185 Encounter for immunization safety counseling: Secondary | ICD-10-CM

## 2014-07-08 LAB — BASIC METABOLIC PANEL
BUN: 15 mg/dL (ref 6–23)
CO2: 27 meq/L (ref 19–32)
Calcium: 9.6 mg/dL (ref 8.4–10.5)
Chloride: 101 mEq/L (ref 96–112)
Creat: 0.97 mg/dL (ref 0.50–1.35)
GLUCOSE: 94 mg/dL (ref 70–99)
Potassium: 4.5 mEq/L (ref 3.5–5.3)
Sodium: 138 mEq/L (ref 135–145)

## 2014-07-08 LAB — LIPID PANEL
Cholesterol: 178 mg/dL (ref 0–200)
HDL: 59 mg/dL (ref >=40)
LDL Cholesterol: 93 mg/dL (ref 0–99)
Total CHOL/HDL Ratio: 3 Ratio
Triglycerides: 130 mg/dL (ref <150)
VLDL: 26 mg/dL (ref 0–40)

## 2014-07-08 LAB — CBC
HCT: 43.7 % (ref 39.0–52.0)
HEMOGLOBIN: 15.2 g/dL (ref 13.0–17.0)
MCH: 31.5 pg (ref 26.0–34.0)
MCHC: 34.8 g/dL (ref 30.0–36.0)
MCV: 90.5 fL (ref 78.0–100.0)
MPV: 10.4 fL (ref 8.6–12.4)
PLATELETS: 177 10*3/uL (ref 150–400)
RBC: 4.83 MIL/uL (ref 4.22–5.81)
RDW: 13.4 % (ref 11.5–15.5)
WBC: 6.9 10*3/uL (ref 4.0–10.5)

## 2014-07-08 LAB — HEPATIC FUNCTION PANEL
ALBUMIN: 4.7 g/dL (ref 3.5–5.2)
ALT: 30 U/L (ref 0–53)
AST: 29 U/L (ref 0–37)
Alkaline Phosphatase: 53 U/L (ref 39–117)
BILIRUBIN INDIRECT: 0.6 mg/dL (ref 0.2–1.2)
Bilirubin, Direct: 0.2 mg/dL (ref 0.0–0.3)
Total Bilirubin: 0.8 mg/dL (ref 0.2–1.2)
Total Protein: 7.5 g/dL (ref 6.0–8.3)

## 2014-07-08 MED ORDER — LISINOPRIL 20 MG PO TABS: 20.0000 mg | ORAL_TABLET | Freq: Every day | ORAL | Status: DC

## 2014-07-08 MED ORDER — METOPROLOL SUCCINATE ER 100 MG PO TB24: 100.0000 mg | ORAL_TABLET | Freq: Every day | ORAL | Status: DC

## 2014-07-08 NOTE — Progress Notes (Signed)
Subjective:   HPI  Logan Phillips is a 64 y.o. male who presents for a complete physical.   Preventative care: Last ophthalmology visit:n/a Last dental visit: n/a Last colonoscopy:not yet Last prostate exam:  Last EKG:12/10/2008 Last labs:06/2013  Prior vaccinations: TD or Tdap:  Not up to date Influenza:declined Pneumococcal:n/a Shingles/Zostavax:n/a   Concerns: HTN - not checking BPs, compliant with medication  Exercises 48miles daily at least walking, owns landscaping business  Has some skin lesions he wants referral for  Still drinking 30+ beers weekly plus liquor shots  No significant other at this time.  Declines STD screen.   Saw vein clinic last year but declined procedures since he was busy.  Reviewed their medical, surgical, family, social, medication, and allergy history and updated chart as appropriate.    Review of Systems Constitutional: -fever, -chills, -sweats, -unexpected weight change, -decreased appetite, -fatigue Allergy: -sneezing, -itching, -congestion Dermatology: -changing moles, --rash, -lumps ENT: -runny nose, -ear pain, -sore throat, -hoarseness, -sinus pain, -teeth pain, +ringing in ears, -hearing loss, -nosebleeds Cardiology: -chest pain, -palpitations, -swelling, -difficulty breathing when lying flat, -waking up short of breath Respiratory: -cough, -shortness of breath, -difficulty breathing with exercise or exertion, -wheezing, -coughing up blood Gastroenterology: -abdominal pain, -nausea, -vomiting, -diarrhea, -constipation, -blood in stool, -changes in bowel movement, -difficulty swallowing or eating Hematology: -bleeding, -bruising  Musculoskeletal: -joint aches, -muscle aches, -joint swelling, -back pain, +neck pain, +cramping, -changes in gait Ophthalmology: denies vision changes, eye redness, itching, discharge Urology: -burning with urination, -difficulty urinating, -blood in urine, -urinary frequency, -urgency,  -incontinence Neurology: -headache, -weakness, +tingling, -numbness, -memory loss, -falls, -dizziness Psychology: -depressed mood, -agitation, -sleep problems     Objective:   Physical Exam  BP 138/98 mmHg  Pulse 60  Temp(Src) 98.6 F (37 C) (Oral)  Resp 15  Ht 5\' 7"  (1.702 m)  Wt 182 lb (82.555 kg)  BMI 28.50 kg/m2  General appearance: alert, no distress, WD/WN, white male Skin: Scattered macules of torso, right upper back with 3 distinct round and slightly raised lesions, somewhat stuck on appearance, right back lesion with irregular brown border HEENT: normocephalic, conjunctiva/corneas normal, sclerae anicteric, PERRLA, EOMi, nares patent, no discharge or erythema, pharynx normal Oral cavity: MMM, tongue normal, teeth - bilateral upper molars with obvious decay, moderate plaque in general Neck: supple, no lymphadenopathy, no thyromegaly, no masses, normal ROM, no bruits Chest: non tender, normal shape and expansion Heart: RRR, normal S1, S2, no murmurs Lungs: CTA bilaterally, no wheezes, rhonchi, or rales Abdomen: +bs, soft, right lower quadrant surgical scar, non tender, non distended, no masses, no hepatomegaly, no splenomegaly, no bruits Back: non tender, normal ROM, no scoliosis Musculoskeletal: upper extremities non tender, no obvious deformity, normal ROM throughout, lower extremities non tender, no obvious deformity, normal ROM throughout Extremities: Left leg with severe large tortuous varicosities, otherwise no edema, no cyanosis, no clubbing Pulses: 2+ symmetric, upper and lower extremities, normal cap refill Neurological: alert, oriented x 3, CN2-12 intact, strength normal upper extremities and lower extremities, sensation normal throughout, DTRs 2+ throughout, no cerebellar signs, gait normal Psychiatric: normal affect, behavior normal, pleasant  GU: normal male external genitalia, circumcised, nontender, no masses, no hernia, no lymphadenopathy Rectal: Anus normal  tone, prostate within normal limits, occult negative stool  Assessment and Plan :      Encounter Diagnoses  Name Primary?  . Encounter for health maintenance examination in adult Yes  . Need for Tdap vaccination   . Essential hypertension   . Abnormal EKG   .  Vaccine counseling   . Varicose veins of left lower extremity   . Excessive drinking alcohol   . Special screening for malignant neoplasms, colon   . Changing skin lesion    Physical exam - discussed healthy lifestyle, diet, exercise, preventative care, vaccinations, and addressed their concerns.   See your eye doctor yearly for routine vision care. See your dentist yearly for routine dental care including hygiene visits twice yearly.  Vaccines: Advised yearly flu shot Counseled on the Tdap (tetanus, diptheria, and acellular pertussis) vaccine.  Vaccine information sheet given. Tdap vaccine given after consent obtained. Advised shingles vaccine.  He will check insurance coverage.  Other issues -   Referral to dermatology for skin surveillance  Advised cardiology referral.  He declines and will call back about this.  Again reiterated his excessive alcohol use, abnormal EKG, and recommendations of updated cardiac evaluation  Varicose veins - he had consult and plans to revisit procedure for leg leg in winter 2016  He is due back for colonoscopy .   He will call back about referral for this  Strongly advised cutting signifnciatly back on alcohol cessation   Hypertension - increase to Toprol XL 100mg  daily, c/t Lisinopril 20mg  daily  Advised he get his hearing aids ASAP  Follow-up pending labs

## 2014-07-08 NOTE — Patient Instructions (Addendum)
Thank you for giving me the opportunity to serve you today.    Your diagnosis today includes: Encounter Diagnoses  Name Primary?  . Encounter for health maintenance examination in adult Yes  . Need for Tdap vaccination   . Essential hypertension   . Abnormal EKG   . Vaccine counseling   . Varicose veins of left lower extremity   . Excessive drinking alcohol   . Special screening for malignant neoplasms, colon   . Changing skin lesion      Specific recommendations today include: See your eye doctor yearly for routine vision care. See your dentist yearly for routine dental care including hygiene visits twice yearly. You are due for repeat colonoscopy this year.  Please let me know when ready for referral We will refer to dermatology Let me know ASAP about setting up either cardiology consult or echocardiogram Plan to see the vein specialist in the winter when your work load is lighter Check insurance coverage for the shingles vaccine. We updated your tetanus, diptheria and pertussis vaccine today.  This is good for 10 years Get a yearly flu vaccine.   Return pending labs, referrals.   I have included other useful information below for your review.   How Much is Too Much Alcohol? Drinking too much alcohol can cause injury, accidents, and health problems. These types of problems can include:   Car crashes.  Falls.  Family fighting (domestic violence).  Drowning.  Fights.  Injuries.  Burns.  Damage to certain organs.  Having a baby with birth defects. ONE DRINK CAN BE TOO MUCH WHEN YOU ARE:  Working.  Pregnant or breastfeeding.  Taking medicines. Ask your doctor.  Driving or planning to drive. WHAT IS A STANDARD DRINK?   1 regular beer (12 ounces or 360 milliliters).  1 glass of wine (5 ounces or 150 milliliters).  1 shot of liquor (1.5 ounces or 45 milliliters). BLOOD ALCOHOL LEVELS   .00 A person is sober.  Marland Kitchen03 A person has no trouble keeping  balance, talking, or seeing right, but a "buzz" may be felt.  Marland Kitchen05 A person feels "buzzed" and relaxed.  Marland Kitchen08 or .10  A person is drunk. He or she has trouble talking, seeing right, and keeping his or her balance.  .15 A person loses body control and may pass out (blackout).  .20 A person has trouble walking (staggering) and throws up (vomits).  .30 A person will pass out (unconscious).  .40+ A person will be in a coma. Death is possible. If you or someone you know has a drinking problem, get help from a doctor.  Document Released: 12/26/2008 Document Revised: 05/24/2011 Document Reviewed: 12/26/2008 [facility] Patient Information 2015 [place], [place]. This information is not intended to replace advice given to you by your health care provider. Make sure you discuss any questions you have with your health care provider.   Alcohol Use Disorder Alcohol use disorder is a mental disorder. It is not a one-time incident of heavy drinking. Alcohol use disorder is the excessive and uncontrollable use of alcohol over time that leads to problems with functioning in one or more areas of daily living. People with this disorder risk harming themselves and others when they drink to excess. Alcohol use disorder also can cause other mental disorders, such as mood and anxiety disorders, and serious physical problems. People with alcohol use disorder often misuse other drugs.  Alcohol use disorder is common and widespread. Some people with this disorder drink alcohol to cope  with or escape from negative life events. Others drink to relieve chronic pain or symptoms of mental illness. People with a family history of alcohol use disorder are at higher risk of losing control and using alcohol to excess.  SYMPTOMS  Signs and symptoms of alcohol use disorder may include the following:   Consumption ofalcohol inlarger amounts or over a longer period of time than intended.  Multiple unsuccessful attempts to cutdown  or control alcohol use.   A great deal of time spent obtaining alcohol, using alcohol, or recovering from the effects of alcohol (hangover).  A strong desire or urge to use alcohol (cravings).   Continued use of alcohol despite problems at work, school, or home because of alcohol use.   Continued use of alcohol despite problems in relationships because of alcohol use.  Continued use of alcohol in situations when it is physically hazardous, such as driving a car.  Continued use of alcohol despite awareness of a physical or psychological problem that is likely related to alcohol use. Physical problems related to alcohol use can involve the brain, heart, liver, stomach, and intestines. Psychological problems related to alcohol use include intoxication, depression, anxiety, psychosis, delirium, and dementia.   The need for increased amounts of alcohol to achieve the same desired effect, or a decreased effect from the consumption of the same amount of alcohol (tolerance).  Withdrawal symptoms upon reducing or stopping alcohol use, or alcohol use to reduce or avoid withdrawal symptoms. Withdrawal symptoms include:  Racing heart.  Hand tremor.  Difficulty sleeping.  Nausea.  Vomiting.  Hallucinations.  Restlessness.  Seizures. DIAGNOSIS Alcohol use disorder is diagnosed through an assessment by your health care provider. Your health care provider may start by asking three or four questions to screen for excessive or problematic alcohol use. To confirm a diagnosis of alcohol use disorder, at least two symptoms must be present within a 6-month period. The severity of alcohol use disorder depends on the number of symptoms:  Mild--two or three.  Moderate--four or five.  Severe--six or more. Your health care provider may perform a physical exam or use results from lab tests to see if you have physical problems resulting from alcohol use. Your health care provider may refer you to a  mental health professional for evaluation. TREATMENT  Some people with alcohol use disorder are able to reduce their alcohol use to low-risk levels. Some people with alcohol use disorder need to quit drinking alcohol. When necessary, mental health professionals with specialized training in substance use treatment can help. Your health care provider can help you decide how severe your alcohol use disorder is and what type of treatment you need. The following forms of treatment are available:   Detoxification. Detoxification involves the use of prescription medicines to prevent alcohol withdrawal symptoms in the first week after quitting. This is important for people with a history of symptoms of withdrawal and for heavy drinkers who are likely to have withdrawal symptoms. Alcohol withdrawal can be dangerous and, in severe cases, cause death. Detoxification is usually provided in a hospital or in-patient substance use treatment facility.  Counseling or talk therapy. Talk therapy is provided by substance use treatment counselors. It addresses the reasons people use alcohol and ways to keep them from drinking again. The goals of talk therapy are to help people with alcohol use disorder find healthy activities and ways to cope with life stress, to identify and avoid triggers for alcohol use, and to handle cravings, which can cause  relapse.  Medicines.Different medicines can help treat alcohol use disorder through the following actions:  Decrease alcohol cravings.  Decrease the positive reward response felt from alcohol use.  Produce an uncomfortable physical reaction when alcohol is used (aversion therapy).  Support groups. Support groups are run by people who have quit drinking. They provide emotional support, advice, and guidance. These forms of treatment are often combined. Some people with alcohol use disorder benefit from intensive combination treatment provided by specialized substance use  treatment centers. Both inpatient and outpatient treatment programs are available. Document Released: 04/08/2004 Document Revised: 07/16/2013 Document Reviewed: 06/08/2012 [facility] Patient Information 2015 [place], [place]. This information is not intended to replace advice given to you by your health care provider. Make sure you discuss any questions you have with your health care provider.

## 2014-07-08 NOTE — Telephone Encounter (Signed)
Refer to Dr. Allyson Sabal for skin concerns, surveillance  Refer to Dr. Wynonia Lawman for baseline cardiac update.   He had stress test years ago, but has abnormal EKG.

## 2014-07-09 LAB — PSA: PSA: 1.66 ng/mL (ref <=4.00)

## 2014-07-10 NOTE — Telephone Encounter (Signed)
Logan Phillips, if you have time can you please do these 2 referrals.

## 2014-07-11 NOTE — Telephone Encounter (Signed)
Referrals made to Dr Lupton(Jan) 07/31/14 @ 9:50 be there 9:40, Dr Wynonia Lawman 07/23/14 @3 :00 bring meds. LM on VM for patient to call back to confirm.

## 2014-07-12 NOTE — Telephone Encounter (Signed)
Patient is aware of his appointments and all the appointment details

## 2014-07-23 ENCOUNTER — Encounter: Payer: Self-pay | Admitting: Cardiology

## 2014-07-23 DIAGNOSIS — I8392 Asymptomatic varicose veins of left lower extremity: Secondary | ICD-10-CM | POA: Insufficient documentation

## 2014-07-23 DIAGNOSIS — R9431 Abnormal electrocardiogram [ECG] [EKG]: Secondary | ICD-10-CM | POA: Insufficient documentation

## 2014-07-23 NOTE — Progress Notes (Signed)
Patient ID: [name], male   DOB: 10/09/1950, 64 y.o.   MRN: [id number]  Logan Phillips, Logan Phillips    Date of visit:  07/23/2014 DOB:  18-Aug-1950    Age:  64 yrs. Medical record number:  [id number]     Account number:  [id number] Primary Care Provider: Robyne Phillips ____________________________ CURRENT DIAGNOSES  1. Essential hypertension  2. Chronic Venous Insufficiency  3. Abnormal electrocardiogram [ECG]  4. Overweight ____________________________ ALLERGIES  No Known Allergies ____________________________ MEDICATIONS  1. lisinopril 20 mg tablet, 1 p.o. daily  2. metoprolol succinate ER 100 mg tablet,extended release 24 hr, 1 p.o. daily  3. creatinine (bulk) 100 % powder, Take as directed  4. Fish Oil 500 mg capsule, 1 p.o. daily  5. Chewable Multi Vitamin tablet  6. aspirin 81 mg tablet,delayed release, 1 p.o. daily  7. Glucosamine 500 mg tablet, 1 p.o. daily ____________________________ CHIEF COMPLAINTS  Followup of Abnormal electrocardiogram [ECG] ____________________________ HISTORY OF PRESENT ILLNESS This 64 year old male is seen at the request of Logan Phillips for evaluation of an abnormal EKG. The patient runs a lawn care business and currently at lift weights in the morning and exercises regularly without any cardiac symptoms. He then does a lawn care business and is quite physically active. He was seen recently for examination was noted to have too much alcohol use review knowledge is and also has a significantly abnormal baseline EKG with inferolateral T-wave inversions. As stated above he is asymptomatic and has no chest pain suggestive of angina. He denies PND, orthopnea or edema. He does have a prior history of hypertension and in 1995 was evaluated for a similar appearing EKG that is present in the apical medical record. At that time he eventually had a cardiac catheterization that showed normal coronary arteries. He had stress testing as well as echocardiogram at that  time. ____________________________ PAST HISTORY  Past Medical Illnesses:  hypertension, varicose veins, lumbar disc disease;  Cardiovascular Illnesses:  no previous history of cardiac disease;  Surgical Procedures:  appendectomy, lumbar laminectomy;  NYHA Classification:  I;  Cardiology Procedures-Invasive:  cardiac cath (left) 1995;  Cardiology Procedures-Noninvasive:  echocardiogram 2001;  Cardiac Cath Results:  normal coronary arteries;  LVEF not documented,   ____________________________ CARDIO-PULMONARY TEST DATES EKG Date:  07/23/2014;   Cardiac Cath Date:  08/05/1993;   ____________________________ FAMILY HISTORY Logan Phillips -- Logan Phillips dead, Dementia/Alzheimers, Parkinsonism Logan Phillips -- Logan Phillips alive and well Logan Phillips -- Logan Phillips alive and well ____________________________ SOCIAL HISTORY Alcohol Use:  significant beer consumption weekly and 6 pack a night and tequila shot;  Diet:  regular diet;  Lifestyle:  divorced and 3 daughters;  Exercise:  exercises daily;  Occupation:  Manufacturing systems engineer;  Residence:  lives with roommate;   ____________________________ REVIEW OF SYSTEMS General:  denies recent weight change, fatique or change in exercise tolerance.  Integumentary:no rashes or new skin lesions. Eyes: denies diplopia, history of glaucoma or visual problems. Ears, Nose, Throat, Mouth:  denies any hearing loss, epistaxis, hoarseness or difficulty speaking. Respiratory: denies dyspnea, cough, wheezing or hemoptysis. Cardiovascular:  please review HPI Abdominal: denies dyspepsia, GI bleeding, constipation, or diarrhea Genitourinary-Male: nocturia  Musculoskeletal:  generalized arthritis Neurological:  denies headaches, stroke, or TIA Psychiatric:  denies depression or anxiety Hematological/Immunologic:  denies any food allergies, bleeding disorders. ____________________________ PHYSICAL EXAMINATION VITAL SIGNS  Blood Pressure:  102/68 Sitting, Right arm, regular cuff  , 110/68 Standing, Right arm and regular  cuff   Pulse:  84/min. Weight:  181.00 lbs. Height:  67"BMI: 28  Constitutional:  pleasant white male in no acute distress Skin:  warm and dry to touch, no apparent skin lesions, or masses noted. Head:  normocephalic, normal hair pattern, no masses or tenderness Eyes:  EOMS Intact, PERRLA, C and S clear, Funduscopic exam not done. ENT:  ears, nose and throat reveal no gross abnormalities.  Dentition good. Neck:  supple, without massess. No JVD, thyromegaly or carotid bruits. Carotid upstroke normal. Chest:  normal symmetry, clear to auscultation. Cardiac:  regular rhythm, normal S1 and S2, No S3 or S4, no murmurs, gallops or rubs detected. Abdomen:  abdomen soft,non-tender, no masses, no hepatospenomegaly, or aneurysm noted Peripheral Pulses:  the femoral,dorsalis pedis, and posterior tibial pulses are full and equal bilaterally with no bruits auscultated. Extremities & Back:  no deformities, clubbing, cyanosis, erythema or edema observed. Normal muscle strength and tone. Marked venous varicosities involving the left leg. Neurological:  no gross motor or sensory deficits noted, affect appropriate, oriented x3. ____________________________ MOST RECENT LIPID PANEL 07/08/14  CHOL TOTL 178 mg/dl, LDL 93 NM, HDL 59 mg/dl and TRIGLYCER 130 mg/dl ____________________________ IMPRESSIONS/PLAN  1. Abnormal EKG going back over 20 years it may represent some form of hypertrophy or a normal variant 2. Hypertension currently controlled 3. Excess alcohol abuse 4. Venous varicose disease  Recommendations:  He is physically active and has no cardiovascular symptoms. EKG changes are similar to those of 20 years ago. I would recommend he have an echocardiogram to evaluate wall thickness and look for hypertrophy. I don't think he needs stress testing at the present time.  EKG shows marked inferolateral and anterolateral T-wave abnormalities and voltage for LVH. ____________________________ TODAYS  ORDERS  1. 2D, color flow, doppler: First Available  2. 12 Lead EKG: Today                       ____________________________ Cardiology Physician:  Logan Hough MD [facility]

## 2014-10-18 ENCOUNTER — Other Ambulatory Visit: Payer: Self-pay | Admitting: Medical

## 2014-10-21 ENCOUNTER — Telehealth: Payer: Self-pay | Admitting: Medical

## 2014-10-21 ENCOUNTER — Other Ambulatory Visit: Payer: Self-pay | Admitting: Medical

## 2014-10-21 MED ORDER — METOPROLOL TARTRATE 100 MG PO TABS: 100.0000 mg | ORAL_TABLET | Freq: Two times a day (BID) | ORAL | Status: DC

## 2014-10-21 NOTE — Telephone Encounter (Signed)
Pt advised regular release metoprolol sent

## 2014-10-21 NOTE — Telephone Encounter (Signed)
Rcvd note from pharmacy stating that pt has been on Metoprolol ER 100mg  before and pt says he no longer has insurance & would like to get Metoprolol ER since it is cheaper

## 2014-11-27 ENCOUNTER — Ambulatory Visit (INDEPENDENT_AMBULATORY_CARE_PROVIDER_SITE_OTHER): Payer: 59 | Admitting: Family Medicine

## 2014-11-27 ENCOUNTER — Encounter: Payer: Self-pay | Admitting: Family Medicine

## 2014-11-27 VITALS — BP 142/98 | HR 60 | Wt 184.2 lb

## 2014-11-27 DIAGNOSIS — M25532 Pain in left wrist: Secondary | ICD-10-CM

## 2014-11-27 LAB — CBC WITH DIFFERENTIAL/PLATELET
BASOS ABS: 0 10*3/uL (ref 0.0–0.1)
Basophils Relative: 0 % (ref 0–1)
Eosinophils Absolute: 0.2 10*3/uL (ref 0.0–0.7)
Eosinophils Relative: 3 % (ref 0–5)
HCT: 42.5 % (ref 39.0–52.0)
Hemoglobin: 15.3 g/dL (ref 13.0–17.0)
LYMPHS PCT: 18 % (ref 12–46)
Lymphs Abs: 1.4 10*3/uL (ref 0.7–4.0)
MCH: 33.3 pg (ref 26.0–34.0)
MCHC: 36 g/dL (ref 30.0–36.0)
MCV: 92.4 fL (ref 78.0–100.0)
MPV: 10.5 fL (ref 8.6–12.4)
Monocytes Absolute: 0.6 10*3/uL (ref 0.1–1.0)
Monocytes Relative: 7 % (ref 3–12)
NEUTROS ABS: 5.8 10*3/uL (ref 1.7–7.7)
Neutrophils Relative %: 72 % (ref 43–77)
Platelets: 164 10*3/uL (ref 150–400)
RBC: 4.6 MIL/uL (ref 4.22–5.81)
RDW: 13.7 % (ref 11.5–15.5)
WBC: 8 10*3/uL (ref 4.0–10.5)

## 2014-11-27 LAB — URIC ACID: URIC ACID, SERUM: 7.3 mg/dL (ref 4.0–7.8)

## 2014-11-27 MED ORDER — HYDROCODONE-ACETAMINOPHEN 10-325 MG PO TABS: 1.0000 | ORAL_TABLET | Freq: Three times a day (TID) | ORAL | Status: DC | PRN

## 2014-11-27 NOTE — Progress Notes (Signed)
   Subjective:    Patient ID: Logan Phillips, male    DOB: [birth year], 64 y.o.   MRN: [id number]  HPI He is here for a 3 day history of a red, hot, swollen wrist. Denies injury. Reports playing golf and doing his usual exercise and weight lifting the day prior to his wrist becoming inflamed. He reports now he is unable to perform his usual activities and cannot grip things due to pain and swelling. Denies pain in any other joint. Denies history of gout. He has tried 2 Aleve twice daily and ice with some relief. Denies fever, chills, nausea, vomiting, diarrhea, or recent illness.  His blood pressure is elevated today and he states it is because he is in great deal of pain. He states he is self-pay and would like to keep the visit to a minimum if possible. He is requesting stronger pain medication.   Review of Systems Pertinent positives and negatives in the history of present illness.    Objective:   Physical Exam  Constitutional: He is oriented to person, place, and time. He appears well-developed and well-nourished. No distress.  Eyes: Conjunctivae and EOM are normal. Right eye exhibits no discharge. Left eye exhibits no discharge.  Neck: Normal range of motion.  Musculoskeletal:       Left wrist: He exhibits decreased range of motion and swelling. He exhibits no tenderness.       Arms: Neurological: He is alert and oriented to person, place, and time.  Skin: Skin is warm and dry. No rash noted.          Assessment & Plan:  Wrist pain, acute, left - Plan: CBC with Differential, Sedimentation rate, Uric acid   Discussed that this could certainly be gout or an infected joint. Labs will help [place] decide this. Recommended an x-ray of his wrist today however he is self-pay and refused this. Recommend he keep taking anti-inflammatory and icing for pain control. Also prescribed stronger pain medication and will further treat based on lab results.

## 2014-11-28 ENCOUNTER — Telehealth: Payer: Self-pay | Admitting: Medical

## 2014-11-28 ENCOUNTER — Other Ambulatory Visit: Payer: Self-pay | Admitting: Medical

## 2014-11-28 LAB — SEDIMENTATION RATE: Sed Rate: 4 mm/hr (ref 0–20)

## 2014-11-28 MED ORDER — METHYLPREDNISOLONE 4 MG PO TABS: ORAL_TABLET | ORAL | Status: DC

## 2014-11-28 NOTE — Telephone Encounter (Signed)
Please call and advise that his symptoms likely represent gout flare.   STOP aleve, c/t the pain pill Vickie wrote, and have him begin Medrol steroid pack .  Have him recheck with ME in 7-10 days.

## 2014-11-28 NOTE — Telephone Encounter (Signed)
Patient notified. He will call back in 7-10 days or sooner if he doesn't improve.

## 2014-12-02 ENCOUNTER — Telehealth: Payer: Self-pay | Admitting: Medical

## 2014-12-02 ENCOUNTER — Other Ambulatory Visit: Payer: Self-pay | Admitting: Medical

## 2014-12-02 ENCOUNTER — Ambulatory Visit: Payer: Self-pay | Admitting: Medical

## 2014-12-02 MED ORDER — NAPROXEN 375 MG PO TABS: 375.0000 mg | ORAL_TABLET | Freq: Two times a day (BID) | ORAL | Status: DC

## 2014-12-02 MED ORDER — COLCHICINE 0.6 MG PO TABS: 0.6000 mg | ORAL_TABLET | Freq: Two times a day (BID) | ORAL | Status: DC

## 2014-12-02 NOTE — Telephone Encounter (Signed)
Pt called back and cancelled his appt for this afternoon. He states that he doesn't have gas and all the gas stations near him are out. He is requesting again that something be called in for him. Please call pt.

## 2014-12-02 NOTE — Telephone Encounter (Signed)
2 medications sent, Colchicine and Naprosyn, make him a f/u appt

## 2014-12-02 NOTE — Telephone Encounter (Signed)
Get him in for recheck, appt, possible labs to check uric acid, re-examine.

## 2014-12-02 NOTE — Telephone Encounter (Signed)
Pt is coming in this afternoon

## 2014-12-02 NOTE — Telephone Encounter (Signed)
Pt called and wanted to see if he could get something else for his wrist, said the swelling has went down but had not helped the gout,still very painful. pt uses [facility] on [address]. Pt number is [phone]

## 2014-12-03 ENCOUNTER — Ambulatory Visit (INDEPENDENT_AMBULATORY_CARE_PROVIDER_SITE_OTHER): Payer: Self-pay | Admitting: Medical

## 2014-12-03 ENCOUNTER — Encounter: Payer: Self-pay | Admitting: Medical

## 2014-12-03 VITALS — BP 130/82 | HR 64 | Temp 98.0°F | Resp 18 | Ht 68.0 in | Wt 180.0 lb

## 2014-12-03 DIAGNOSIS — M25432 Effusion, left wrist: Secondary | ICD-10-CM

## 2014-12-03 DIAGNOSIS — M2550 Pain in unspecified joint: Secondary | ICD-10-CM

## 2014-12-03 DIAGNOSIS — M25532 Pain in left wrist: Secondary | ICD-10-CM

## 2014-12-03 MED ORDER — HYDROCODONE-ACETAMINOPHEN 10-325 MG PO TABS: 1.0000 | ORAL_TABLET | Freq: Three times a day (TID) | ORAL | Status: DC | PRN

## 2014-12-03 NOTE — Progress Notes (Signed)
   Subjective:    Patient ID: Logan Phillips, male    DOB: May 12, 1950, 64 y.o.   MRN: [id number]  Here for recheck on left wrist pain and swelling.  Was seen here 11/27/14 for same by [facility] NP here.   Given hydrocodone that day, called back a few days latera at which time I called out Medrol Dosepak.  Overall improving but still has pain and decreased ROM, worse pain at night trying to sleep.   swelling and redness has resolved.    He originally came in for 3 day hx/o red, hot, swollen wrist. Denies injury.  He had been playing golf and doing his usual work and exercise leading up to this, but recalls today that he had a big plate of shrimp the day of the flare up.Denies fever, chills, nausea, vomiting, diarrhea, or recent illness. No prior similar.  No other aggravating or relieving factors. No other complaint.  Review of Systems Pertinent positives and negatives in the history of present illness.    Objective:   Physical Exam  Constitutional: He appears well-developed and well-nourished. No distress.  Musculoskeletal:       Left wrist: He exhibits decreased range of motion and pain with ROM.  Mild tenderness over wrist in general.  otherwise rest of bilat UE exam unremarkable      Arms: Neurological: He is alert and oriented to person, place, and time.   Arms normal strength and sensation Pulses normal, cap refill normal No ext edema Skin: Skin is warm and dry. No rash noted.         Assessment & Plan:  Left wrist pain  Arthralgia  Wrist swelling, left   Discussed that this could certainly be gout, but can't rule out OA or tenosynovitis.    Reviewed the labds from last visit.    Refilled short term Hydrocodone.   Begin naprosyn and colchicine that was called out yesterday although he apparently didn't get a call back form [place].   Advised night time carpal tunnel splint, relative rest, can use ice.   Discussed difference between OA, gout, tenosynovitis and diet recommendations in the  event of gout.   If not much improved in 1 week, then call back.

## 2014-12-03 NOTE — Telephone Encounter (Signed)
Pt states Colchicine was $200 & he can't afford.  The pharmacy recommended Indocin 50mg  which is much cheaper, can you switch pt to that?  To [employer]

## 2014-12-03 NOTE — Telephone Encounter (Signed)
I had sent Naprosyn already.  Was naprosyn ok price?  (instead of Indocin?).     We can hold off on Colchicine at this time given the cost

## 2014-12-04 ENCOUNTER — Ambulatory Visit: Payer: Self-pay | Admitting: Medical

## 2014-12-05 MED ORDER — INDOMETHACIN 50 MG PO CAPS: ORAL_CAPSULE | ORAL | Status: DC

## 2014-12-05 NOTE — Telephone Encounter (Signed)
In that case, I'll send Indocin, but stop Naprosyn.

## 2014-12-05 NOTE — Telephone Encounter (Signed)
Pt informed & I called in Indocin and cancelled Naprosyn & Colchicine per Audelia Acton

## 2014-12-05 NOTE — Telephone Encounter (Signed)
Pt called and was upset that he had not heard anything about if Audelia Acton would call in Indocin. Advised pt that [facility] sent in Naprosyn on 9/19 instead and wanted to know if this med was ok price wise. Pt said he was advise of Naprosyn being ready for pick up several days ago so he has that med and it is not working. He has used that med in the past as well. Pt would still like to try Indocin 50mg 

## 2014-12-09 ENCOUNTER — Telehealth: Payer: Self-pay | Admitting: Medical

## 2014-12-09 DIAGNOSIS — M25532 Pain in left wrist: Secondary | ICD-10-CM

## 2014-12-09 NOTE — Telephone Encounter (Signed)
Pt states Indocin didn't help, it made him sick so he stopped taking it,  Using splint and it's not really helping much either.  Wants to know if you would call in an antibiotic in case there is infection in it?  He doesn't know what else to do.  The steroid dose pack did help with the swelling but not the pain. What do you recommend?  Please call

## 2014-12-09 NOTE — Telephone Encounter (Signed)
Pt informed and verbalized understanding

## 2014-12-09 NOTE — Telephone Encounter (Signed)
Tell him that I put an order in for an x-ray. We will call him with the results and our recommendations at that point

## 2014-12-09 NOTE — Telephone Encounter (Signed)
LEFT MESSAGE FOR PT TO CALL BACK.

## 2015-08-22 ENCOUNTER — Other Ambulatory Visit: Payer: Self-pay | Admitting: Medical

## 2015-09-09 ENCOUNTER — Ambulatory Visit (INDEPENDENT_AMBULATORY_CARE_PROVIDER_SITE_OTHER): Payer: Self-pay | Admitting: Medical

## 2015-09-09 ENCOUNTER — Encounter: Payer: Self-pay | Admitting: Medical

## 2015-09-09 VITALS — BP 130/90 | HR 71 | Wt 180.0 lb

## 2015-09-09 DIAGNOSIS — S6992XA Unspecified injury of left wrist, hand and finger(s), initial encounter: Secondary | ICD-10-CM

## 2015-09-09 DIAGNOSIS — M25432 Effusion, left wrist: Secondary | ICD-10-CM

## 2015-09-09 DIAGNOSIS — M25532 Pain in left wrist: Secondary | ICD-10-CM

## 2015-09-09 MED ORDER — INDOMETHACIN 50 MG PO CAPS: ORAL_CAPSULE | ORAL | Status: DC

## 2015-09-09 MED ORDER — HYDROCODONE-ACETAMINOPHEN 10-325 MG PO TABS: 1.0000 | ORAL_TABLET | Freq: Three times a day (TID) | ORAL | Status: DC | PRN

## 2015-09-09 NOTE — Progress Notes (Signed)
Subjective: Chief Complaint  Patient presents with  . lt wrist pain    happened last summer. happened playing golf, then he fell and caught himself with his left hand   Here for left wrist pain.  Date of injury 09/06/15.  Was playing golf, fell in a hole with foot, but went out with the left hand to catch himself.  Didn't have immediate pain, but over the next few days, pain has gradually worsened in left wrist.  When he fell thinks his fingers bent back on the left side.   Not sure if the fall injured the hand or from hitting a iron club to hard.  He is right handed.  He notes injuring himself similarly last year in the summer.  He notes some swelling.   Denies numbness or tingling.  Can't move fingers, but pain is mostly in the left wrist.  No other aggravating or relieving factors. No other complaint.  Past Medical History  Diagnosis Date  . Hypertension   . Varicose vein   . Hearing loss     pending hearing aids 06/2014   Past Surgical History  Procedure Laterality Date  . Appendectomy    . Wisdom tooth extraction    . Colonoscopy      age 7, repeat 10 years  . Lumbar disc surgery  2002    Dr. Rita Ohara   ROS as in subjective   Objective: BP 130/90 mmHg  Pulse 71  Wt 180 lb (81.647 kg)  Gen: wd, wn No obvious erythema or bruising Tender throughout left wrist and forearm, very limited left wrist motion, fingers decreased motion due to pain Hand and elbow and fingers nontender to palpation Pulses and cap refill normal decreased strength of left fingers and wrist due to pain, sensation of fingers and hands normal    Assessment Encounter Diagnoses  Name Primary?  . Left wrist injury, initial encounter Yes  . Left wrist pain   . Wrist swelling, left      Plan: Recommended xray.   He declines for now.   Advised rest, avoid re injury, ice 20 min TID, c/t the reinforced wrist splint he is using, begin medications below.  If no improvement within 3-5 days, call so we can  send for xray.  If it happens to just be sprain, then once pain and swelling improved over the next week, then he can gradually work on stretching and stretching as discussed.   advised 1wk f/u or sooner.

## 2015-10-16 ENCOUNTER — Other Ambulatory Visit: Payer: Self-pay | Admitting: Medical

## 2015-10-21 ENCOUNTER — Emergency Department (HOSPITAL_COMMUNITY): Payer: Self-pay

## 2015-10-21 ENCOUNTER — Encounter (HOSPITAL_COMMUNITY): Payer: Self-pay | Admitting: Emergency Medicine

## 2015-10-21 ENCOUNTER — Emergency Department (HOSPITAL_COMMUNITY)
Admission: EM | Admit: 2015-10-21 | Discharge: 2015-10-21 | Disposition: A | Discharge status: Home / Self Care | Payer: Self-pay | Attending: Emergency Medicine | Admitting: Emergency Medicine

## 2015-10-21 DIAGNOSIS — S63502D Unspecified sprain of left wrist, subsequent encounter: Secondary | ICD-10-CM | POA: Insufficient documentation

## 2015-10-21 DIAGNOSIS — W1830XD Fall on same level, unspecified, subsequent encounter: Secondary | ICD-10-CM | POA: Insufficient documentation

## 2015-10-21 DIAGNOSIS — Z79899 Other long term (current) drug therapy: Secondary | ICD-10-CM | POA: Insufficient documentation

## 2015-10-21 DIAGNOSIS — I10 Essential (primary) hypertension: Secondary | ICD-10-CM | POA: Insufficient documentation

## 2015-10-21 MED ORDER — CYCLOBENZAPRINE HCL 10 MG PO TABS: 10.0000 mg | ORAL_TABLET | Freq: Two times a day (BID) | ORAL | 0 refills | Status: DC | PRN

## 2015-10-21 MED ORDER — IBUPROFEN 800 MG PO TABS: 800.0000 mg | ORAL_TABLET | Freq: Three times a day (TID) | ORAL | 0 refills | Status: DC | PRN

## 2015-10-21 NOTE — ED Provider Notes (Signed)
[facility] DEPT Provider Note   CSN: [id number] Arrival date & time: 10/21/15  [id number]   First MD Initiated Contact with Patient 10/21/15 1033       By signing my name below, I, Sonum Patel, attest that this documentation has been prepared under the direction and in the presence of Domenic Moras, PA-C. Electronically Signed: Sonum Patel, Education administrator. 10/21/15. 11:13 AM.  History   Chief Complaint Chief Complaint  Patient presents with  . Wrist Pain    The history is provided by the patient. No language interpreter was used.     HPI Comments: Logan Phillips is a 65 y.o. male who presents to the Emergency Department complaining of left wrist and forearm pain that worsened 2 days ago. Patient states he injured the affected area 10 weeks ago by falling onto an outstretched left wrist. He was prescribed anti-inflammatories and a wrist brace when initially evaluated for this; states this was improving his symptoms until 2 days ago. He denies left elbow or shoulder pain, numbness, weakness.   Past Medical History:  Diagnosis Date  . Hearing loss    pending hearing aids 06/2014  . Hypertension   . Varicose vein     Patient Active Problem List   Diagnosis Date Noted  . Abnormal EKG 07/23/2014  . Varicose veins of left lower extremity   . Essential hypertension 07/08/2014  . Foot pain, left 04/14/2011    Past Surgical History:  Procedure Laterality Date  . APPENDECTOMY    . COLONOSCOPY     age 37, repeat 10 years  . LUMBAR DISC SURGERY  2002   Dr. Rita Ohara  . WISDOM TOOTH EXTRACTION         Home Medications    Prior to Admission medications   Medication Sig Start Date End Date Taking? Authorizing Provider  fish oil-omega-3 fatty acids 1000 MG capsule Take 1 g by mouth daily.    Historical Provider, MD  HYDROcodone-acetaminophen (NORCO) 10-325 MG tablet Take 1 tablet by mouth every 8 (eight) hours as needed. 09/09/15   Camelia Eng Tysinger, PA-C  indomethacin (INDOCIN) 50 MG capsule  1 Q6H PRN 09/09/15   Camelia Eng Tysinger, PA-C  lisinopril (PRINIVIL,ZESTRIL) 20 MG tablet TAKE ONE TABLET BY MOUTH ONCE DAILY 10/16/15   Camelia Eng Tysinger, PA-C  metoprolol (LOPRESSOR) 100 MG tablet TAKE ONE TABLET BY MOUTH TWICE DAILY 10/16/15   Camelia Eng Tysinger, PA-C  Multiple Vitamin (MULITIVITAMIN WITH MINERALS) TABS Take 1 tablet by mouth daily.    Historical Provider, MD    Family History Family History  Problem Relation Age of Onset  . Hypertension Mother   . Dementia Father   . Parkinsonism Father   . Diabetes Father     early stages  . Cancer Maternal Grandmother     breast  . Heart disease Neg Hx   . Stroke Neg Hx     Social History Social History  Substance Use Topics  . Smoking status: Never Smoker  . Smokeless tobacco: Never Used  . Alcohol use 19.2 oz/week    30 Cans of beer, 2 Shots of liquor per week     Allergies   Review of patient's allergies indicates no known allergies.   Review of Systems Review of Systems  Musculoskeletal: Positive for arthralgias and myalgias.  Neurological: Negative for weakness and numbness.     Physical Exam Updated Vital Signs BP (!) 165/112 (BP Location: Right Arm)   Pulse 65   Temp 98.2 F (36.8 C) (  Oral)   Resp 20   Ht 5\' 7"  (1.702 m)   Wt 180 lb (81.6 kg)   SpO2 95%   BMI 28.19 kg/m   Physical Exam  Constitutional: He is oriented to person, place, and time. He appears well-developed and well-nourished.  HENT:  Head: Normocephalic and atraumatic.  Cardiovascular: Normal rate.   Pulmonary/Chest: Effort normal.  Musculoskeletal: He exhibits tenderness. He exhibits no deformity.  Left wrist pain with wrist extension and flexion with mild dorsal edema. No erythema or red streaking. Fingers with full ROM but pain with finger flexion. Radial pulses 2+. Brisk cap refill. Normal skin tone to palmar aspect.   Neurological: He is alert and oriented to person, place, and time.  Skin: Skin is warm and dry. No erythema.    Psychiatric: He has a normal mood and affect.  Nursing note and vitals reviewed.    ED Treatments / Results  DIAGNOSTIC STUDIES: Oxygen Saturation is 95% on RA, adequate by my interpretation.    COORDINATION OF CARE: 11:20 AM Advised to continue with anti-inflammatories and brace. Advised to follow up with hand specialists and return precautions given. Discussed treatment plan with pt at bedside and pt agreed to plan.    Labs (all labs ordered are listed, but only abnormal results are displayed) Labs Reviewed - No data to display  EKG  EKG Interpretation None       Radiology Dg Wrist Complete Left  Result Date: 10/21/2015 CLINICAL DATA:  Pain for 10 weeks.  Sprained wrist. EXAM: LEFT WRIST - COMPLETE 3+ VIEW COMPARISON:  None. FINDINGS: No acute fracture or malalignment. Mild spurring seen at the radiocarpal joint and lunate, without joint narrowing. No erosive changes or acute soft tissue finding. IMPRESSION: 1. No acute finding. 2. Mild spurring at the wrist without joint narrowing. Electronically Signed   By: Monte Fantasia M.D.   On: 10/21/2015 11:05    Procedures Procedures (including critical care time)  Medications Ordered in ED Medications - No data to display   Initial Impression / Assessment and Plan / ED Course  I have reviewed the triage vital signs and the nursing notes.  Pertinent labs & imaging results that were available during my care of the patient were reviewed by me and considered in my medical decision making (see chart for details).  Clinical Course    BP (!) 165/112 (BP Location: Right Arm)   Pulse 65   Temp 98.2 F (36.8 C) (Oral)   Resp 20   Ht 5\' 7"  (1.702 m)   Wt 81.6 kg   SpO2 95%   BMI 28.19 kg/m    Final Clinical Impressions(s) / ED Diagnoses   Final diagnoses:  Left wrist sprain, subsequent encounter    New Prescriptions New Prescriptions   CYCLOBENZAPRINE (FLEXERIL) 10 MG TABLET    Take 1 tablet (10 mg total) by mouth  2 (two) times daily as needed for muscle spasms.   IBUPROFEN (ADVIL,MOTRIN) 800 MG TABLET    Take 1 tablet (800 mg total) by mouth every 8 (eight) hours as needed for moderate pain.    I personally performed the services described in this documentation, which was scribed in my presence. The recorded information has been reviewed and is accurate.   11:24 AM Recurrent L wrist (non dominant hand) pain after injury 10 weeks ago.  Pain with flexion/extension.  Suspect sprain vs tenosynovitis.  Doubt septic joint, doubt gout, and doubt infection.  Xray negative.  No evidence to suggest scaphoid fx  or AVN.  Hand specialist referral given as needed.  RICE therapy discussed.  Wrist brace encourage.  Return precaution given.  Pt is NVI.     Domenic Moras, PA-C 10/21/15 [place], MD 10/21/15 [phone]

## 2015-10-21 NOTE — ED Notes (Signed)
Patient transported to X-ray 

## 2016-01-01 ENCOUNTER — Other Ambulatory Visit: Payer: Self-pay | Admitting: Medical

## 2016-01-05 ENCOUNTER — Telehealth: Payer: Self-pay

## 2016-01-05 ENCOUNTER — Other Ambulatory Visit: Payer: Self-pay | Admitting: Medical

## 2016-01-05 MED ORDER — LISINOPRIL 20 MG PO TABS: 20.0000 mg | ORAL_TABLET | Freq: Every day | ORAL | 0 refills | Status: DC

## 2016-01-05 MED ORDER — METOPROLOL TARTRATE 100 MG PO TABS: 100.0000 mg | ORAL_TABLET | Freq: Two times a day (BID) | ORAL | 0 refills | Status: DC

## 2016-01-05 NOTE — Telephone Encounter (Signed)
LM for pt to CB to schedule appt-  rcvd request for Lisinopril and metoprolol- last rx 10/2015 for 30 days with note that he needs appt. Rx request REFUSED, until we speak with pt and schedule f/u. Victorino December

## 2016-01-05 NOTE — Telephone Encounter (Signed)
rx for 30 days of lisinopril and lopressor sent to pharmacy. Pt scheduled appt for Tuesday for f/u. Logan Phillips

## 2016-01-13 ENCOUNTER — Ambulatory Visit: Payer: Self-pay | Admitting: Medical

## 2016-01-19 ENCOUNTER — Ambulatory Visit (INDEPENDENT_AMBULATORY_CARE_PROVIDER_SITE_OTHER): Payer: Self-pay | Admitting: Medical

## 2016-01-19 ENCOUNTER — Encounter: Payer: Self-pay | Admitting: Medical

## 2016-01-19 VITALS — BP 142/88 | HR 78 | Wt 179.8 lb

## 2016-01-19 DIAGNOSIS — I8392 Asymptomatic varicose veins of left lower extremity: Secondary | ICD-10-CM

## 2016-01-19 DIAGNOSIS — I10 Essential (primary) hypertension: Secondary | ICD-10-CM

## 2016-01-19 MED ORDER — LISINOPRIL 20 MG PO TABS: 20.0000 mg | ORAL_TABLET | Freq: Every day | ORAL | 1 refills | Status: DC

## 2016-01-19 MED ORDER — METOPROLOL TARTRATE 100 MG PO TABS: 100.0000 mg | ORAL_TABLET | Freq: Two times a day (BID) | ORAL | 1 refills | Status: DC

## 2016-01-19 NOTE — Progress Notes (Signed)
Subjective: Chief Complaint  Patient presents with  . follow up  b/p    follow up b/p no concern   Here for f/u on hypertension.  For whatever reason he has only been using Metoprolol 100mg , 1/2 tablet BID instead of 1 tablet BID.   He decided to do this on his own, but cant' give good reason why.  He was on Toprol xl 100mg  prior but had to change to due no insurance this past year.   He is compliant with lisinopril 20mg  daily.   He denies chest pain, edema, SOB.  He will go on medicare in 05/2016 so wants to hold off on other medical eval such as colonoscopy til then.   Past Medical History:  Diagnosis Date  . Hearing loss    pending hearing aids 06/2014  . Hypertension   . Varicose vein    Current Outpatient Prescriptions on File Prior to Visit  Medication Sig Dispense Refill  . fish oil-omega-3 fatty acids 1000 MG capsule Take 1 g by mouth daily.    Marland Kitchen ibuprofen (ADVIL,MOTRIN) 800 MG tablet Take 1 tablet (800 mg total) by mouth every 8 (eight) hours as needed for moderate pain. 30 tablet 0  . lisinopril (PRINIVIL,ZESTRIL) 20 MG tablet Take 1 tablet (20 mg total) by mouth daily. 30 tablet 0  . metoprolol (LOPRESSOR) 100 MG tablet Take 1 tablet (100 mg total) by mouth 2 (two) times daily. 60 tablet 0  . Multiple Vitamin (MULITIVITAMIN WITH MINERALS) TABS Take 1 tablet by mouth daily.     No current facility-administered medications on file prior to visit.    ROS as in subjective     Objective: BP (!) 142/88   Pulse 78   Wt 179 lb 12.8 oz (81.6 kg)   SpO2 98%   BMI 28.16 kg/m   BP Readings from Last 3 Encounters:  01/19/16 (!) 142/88  10/21/15 (!) 165/112  09/09/15 130/90   Wt Readings from Last 3 Encounters:  01/19/16 179 lb 12.8 oz (81.6 kg)  10/21/15 180 lb (81.6 kg)  09/09/15 180 lb (81.6 kg)   General appearance: alert, no distress, WD/WN,  Neck: supple, no lymphadenopathy, no thyromegaly, no masses, no bruits Heart: RRR, normal S1, S2, no murmurs Lungs: CTA  bilaterally, no wheezes, rhonchi, or rales Abdomen: +bs, soft, non tender, non distended, no masses, no hepatomegaly, no splenomegaly Ext: no edema but left lower leg with significant tortuous varicosities Pulses: 2+ symmetric, upper and lower extremities, normal cap refill    Assessment: Encounter Diagnoses  Name Primary?  . Essential hypertension Yes  . Varicose veins of left lower extremity     Plan: Change back to 1 tablet Metoprolol 100mg  BID, c/t lisinopril 20mg  daily Plan echo once insurance kicks in next year Labs upon next visit spring 2018 He is a nonsmoker, but counseled on heavy alcohol use C/t healthy diet and exercise otherwise F/u spring 2018

## 2016-01-19 NOTE — Patient Instructions (Signed)
Change the Metoprolol to 100mg  twice daily instead of 1/2 twice daily continue Lisinopril 20mg  daily in the morning   In anticipation of going on medicare, talk to the insurance salesperson about your benefits. You should be entitle to a Welcome to Medicare preventative visit the first year You should be able to do routine labs such as CMET, CBC, Lipid, prostate, thyroid You will be due for cancer screening, echocardiogram, and other evaluation including varicose vein evaluation

## 2016-03-15 DIAGNOSIS — G8929 Other chronic pain: Secondary | ICD-10-CM

## 2016-03-15 HISTORY — DX: Other chronic pain: G89.29

## 2016-08-26 ENCOUNTER — Ambulatory Visit: Payer: Medicare HMO | Admitting: Medical

## 2016-08-26 ENCOUNTER — Telehealth: Payer: Self-pay

## 2016-08-27 NOTE — Telephone Encounter (Signed)
Pt came in for an acute  office visit  And while  gett

## 2016-08-31 ENCOUNTER — Ambulatory Visit: Payer: Medicare HMO | Admitting: Medical

## 2016-09-09 ENCOUNTER — Ambulatory Visit: Payer: Medicare HMO | Admitting: Medical

## 2016-09-12 ENCOUNTER — Other Ambulatory Visit: Payer: Self-pay | Admitting: Medical

## 2016-09-13 ENCOUNTER — Other Ambulatory Visit: Payer: Self-pay | Admitting: Medical

## 2016-10-04 ENCOUNTER — Ambulatory Visit (INDEPENDENT_AMBULATORY_CARE_PROVIDER_SITE_OTHER): Payer: Medicare HMO | Admitting: Medical

## 2016-10-04 ENCOUNTER — Encounter: Payer: Self-pay | Admitting: Medical

## 2016-10-04 VITALS — BP 124/84 | HR 67 | Ht 67.0 in | Wt 178.4 lb

## 2016-10-04 DIAGNOSIS — Z13220 Encounter for screening for lipoid disorders: Secondary | ICD-10-CM | POA: Diagnosis not present

## 2016-10-04 DIAGNOSIS — R9431 Abnormal electrocardiogram [ECG] [EKG]: Secondary | ICD-10-CM | POA: Diagnosis not present

## 2016-10-04 DIAGNOSIS — Z0000 Encounter for general adult medical examination without abnormal findings: Secondary | ICD-10-CM | POA: Diagnosis not present

## 2016-10-04 DIAGNOSIS — Z23 Encounter for immunization: Secondary | ICD-10-CM | POA: Diagnosis not present

## 2016-10-04 DIAGNOSIS — Z125 Encounter for screening for malignant neoplasm of prostate: Secondary | ICD-10-CM

## 2016-10-04 DIAGNOSIS — Z136 Encounter for screening for cardiovascular disorders: Secondary | ICD-10-CM

## 2016-10-04 DIAGNOSIS — Z7185 Encounter for immunization safety counseling: Secondary | ICD-10-CM

## 2016-10-04 DIAGNOSIS — Z7189 Other specified counseling: Secondary | ICD-10-CM

## 2016-10-04 DIAGNOSIS — I10 Essential (primary) hypertension: Secondary | ICD-10-CM

## 2016-10-04 DIAGNOSIS — D171 Benign lipomatous neoplasm of skin and subcutaneous tissue of trunk: Secondary | ICD-10-CM

## 2016-10-04 DIAGNOSIS — H9193 Unspecified hearing loss, bilateral: Secondary | ICD-10-CM

## 2016-10-04 DIAGNOSIS — Z131 Encounter for screening for diabetes mellitus: Secondary | ICD-10-CM

## 2016-10-04 DIAGNOSIS — I8392 Asymptomatic varicose veins of left lower extremity: Secondary | ICD-10-CM

## 2016-10-04 DIAGNOSIS — Z1211 Encounter for screening for malignant neoplasm of colon: Secondary | ICD-10-CM | POA: Diagnosis not present

## 2016-10-04 LAB — COMPREHENSIVE METABOLIC PANEL
ALBUMIN: 4.2 g/dL (ref 3.6–5.1)
ALT: 20 U/L (ref 9–46)
AST: 20 U/L (ref 10–35)
Alkaline Phosphatase: 56 U/L (ref 40–115)
BILIRUBIN TOTAL: 1 mg/dL (ref 0.2–1.2)
BUN: 19 mg/dL (ref 7–25)
CHLORIDE: 103 mmol/L (ref 98–110)
CO2: 22 mmol/L (ref 20–31)
CREATININE: 1.09 mg/dL (ref 0.70–1.25)
Calcium: 9.2 mg/dL (ref 8.6–10.3)
GLUCOSE: 106 mg/dL — AB (ref 65–99)
Potassium: 4.7 mmol/L (ref 3.5–5.3)
SODIUM: 137 mmol/L (ref 135–146)
Total Protein: 7 g/dL (ref 6.1–8.1)

## 2016-10-04 LAB — CBC
HEMATOCRIT: 44.5 % (ref 38.5–50.0)
Hemoglobin: 15.4 g/dL (ref 13.2–17.1)
MCH: 32.4 pg (ref 27.0–33.0)
MCHC: 34.6 g/dL (ref 32.0–36.0)
MCV: 93.7 fL (ref 80.0–100.0)
MPV: 10.4 fL (ref 7.5–12.5)
PLATELETS: 186 10*3/uL (ref 140–400)
RBC: 4.75 MIL/uL (ref 4.20–5.80)
RDW: 13.1 % (ref 11.0–15.0)
WBC: 5.1 10*3/uL (ref 4.0–10.5)

## 2016-10-04 LAB — LIPID PANEL
Cholesterol: 172 mg/dL (ref <200)
HDL: 61 mg/dL (ref >40)
LDL CALC: 86 mg/dL (ref <100)
Total CHOL/HDL Ratio: 2.8 Ratio (ref <5.0)
Triglycerides: 123 mg/dL (ref <150)
VLDL: 25 mg/dL (ref <30)

## 2016-10-04 NOTE — Addendum Note (Signed)
Addended by: Tyrone Apple on: 10/04/2016 10:01 AM   Modules accepted: Orders

## 2016-10-04 NOTE — Patient Instructions (Signed)
MEDICARE PREVENTATIVE SERVICES AND PERSONALIZED PLAN for Logan Phillips October 04, 2016  Thank you for trusting [place] with your health care.   Here is a summary and plan from your visit.  Your list of diagnoses today/Problem List: Encounter Diagnoses  Name Primary?  . Initial Medicare annual wellness visit Yes  . Essential hypertension   . Varicose veins of left lower extremity   . Vaccine counseling   . Screen for colon cancer   . Screening for prostate cancer   . Screening for lipid disorders   . Screening for diabetes mellitus   . Screening for heart disease   . Abnormal EKG   . Need for pneumococcal vaccination   . Lipoma of torso   . Hearing difficulty of both ears     Call plastic surgery office to see if they do liposuction for lipomas.  If not, let me know and we will refer you to a general surgeon  Cancer Screening Colorectal cancer screening: we are referring you back for updated colonoscopy  Prostate cancer screening: we will check PSA lab today   Osteoporosis Screening Bone Density Screening for bone health:  Not indicated   Cardiovascular Screening Blood pressure: BP Readings from Last 3 Encounters:  10/04/16 124/84  01/19/16 (!) 142/88  10/21/15 (!) 165/112       We will refer you for updated ultrasound/echocardiogram of your heart   Abdominal Aortic Aneurysm Screen You do not qualify   Diabetes Screening Lab Results  Component Value Date   HGBA1C 4.7 07/03/2013     Weight Screen/BMI Screen  Wt Readings from Last 3 Encounters:  10/04/16 178 lb 6.4 oz (80.9 kg)  01/19/16 179 lb 12.8 oz (81.6 kg)  10/21/15 180 lb (81.6 kg)    Your height to weight ratio is Body mass index is 27.94 kg/m.  Reference Range:  Underweight: BMI less than 18.5.  Normal weight: BMI between 18.5 and 24.9.  Overweight: BMI between 25 and 29.9.  Obese: BMI of 30 and above.   Your listed Vaccines/Immunizations: Immunization History  Administered Date(s)  Administered  . Tdap 07/08/2014    I recommend the following vaccines: I recommend you have a shingles vaccine to help prevent shingles or herpes zoster outbreak.   Please call your insurer to inquire about coverage for the Shingrix vaccine given in 2 doses.   Some insurers cover this vaccine after age 72, some cover this after age 66.  If your insurer covers this, then call to schedule appointment to have this vaccine here.  We updated Prevnar 13 pneumonia vaccine today.   You will be due for Pneumococcal 23 vaccine next year  Get a yearly flu shot in the fall    [place]:  Supplements:  . Take a daily baby Aspirin 81mg  at bedtime for heart health unless you have a history of gastrointestinal bleed, allergy to aspirin, or are already taking higher dose Aspirin or other antiplatelet or blood thinner medication.   . Consume 1200 mg of Calcium daily through dietary calcium or supplement if you are male age 64 or older, or men 73 and older.   Men aged 55-70 should consume 1000 mg of Calcium daily. . Take 600 IU of Vitamin D daily.  Take 800 IU of Calcium daily if you are older than age 51.  . Take a general multivitamin daily.   Healthy diet: Eat a variety of foods, including fruits, vegetables, vegetable protein such as beans, lentils, tofu, and  grains, such as rice.  Limit meat or animal protein, but if you eat meat, choose leans cuts such as chicken, fish, or [place].  Drink plenty of water daily.  Decrease saturated fat in the diet, avoid lots of red meat, processed foods, sweets, fast foods, and fried foods.  Limit salt and caffeine intake.  Exercise: Aerobic exercise helps maintain good heart health. Weight bearing exercise helps keep bones and muscles working strong.  We recommend at least 30-40 minutes of exercise most days of the week.   Fall prevention: Falls are the leading cause of injuries, accidents, and accidental deaths in people over the age  of 7. Falling is a real threat to your ability to live on your own.  Causes include poor eyesight or poor hearing, illness, poor lighting, throw rugs, clutter in your home, and medication side effects causing dizziness or balance problems.  Such medications can include medications for depression, sleep problems, high blood pressure, diabetes, and heart conditions.   PREVENTION  Be sure your home is as safe as possible. Here are some tips:  Wear shoes with non-skid soles (not house slippers).   Be sure your home and outside area are well lit.   Use night lights throughout your house, including hallways and stairways.   Remove clutter and clean up spills on floors and walkways.   Remove throw rugs or fasten them to the floor with carpet tape. Tack down carpet edges.   Do not place electrical cords across pathways.   Install grab bars in your bathtub, shower, and toilet area. Towel bars should not be used as a grab bar.   Install handrails on both sides of stairways.   Do not climb on stools or stepladders. Get someone else to help with jobs that require climbing.   Do not wax your floors at all, or use a non-skid wax.   Repair uneven or unsafe sidewalks, walkways or stairs.   Keep frequently used items within reach.   Be aware of pets so you do not trip.  Get regular check-ups from your doctor, and take good care of yourself:  Have your eyes checked every year for vision changes, cataracts, glaucoma, and other eye problems. Wear eyeglasses as directed.   Have your hearing checked every 2 years, or anytime you or others think that you cannot hear well. Use hearing aids as directed.   See your caregiver if you have foot pain or corns. Sore feet can contribute to falls.   Let your caregiver know if a medicine is making you feel dizzy or making you lose your balance.   Use a cane, walker, or wheelchair as directed. Use walker or wheelchair brakes when getting in and out.   When  you get up from bed, sit on the side of the bed for 1 to 2 minutes before you stand up. This will give your blood pressure time to adjust, and you will feel less dizzy.   If you need to go to the bathroom often, consider using a bedside commode.  Disease prevention:  If you smoke or chew tobacco, find out from your caregiver how to quit. It can literally save your life, no matter how long you have been a tobacco user. If you do not use tobacco, never begin. Medicare does cover some smoking cessation counseling.  Maintain a healthy diet and normal weight. Increased weight leads to problems with blood pressure and diabetes. We check your height, weight, and BMI as part of your  yearly visit.  The Body Mass Index or BMI is a way of measuring how much of your body is fat. Having a BMI above 27 increases the risk of heart disease, diabetes, hypertension, stroke and other problems related to obesity. Your caregiver can help determine your BMI and based on it develop an exercise and dietary program to help you achieve or maintain this important measurement at a healthful level.  High blood pressure causes heart and blood vessel problems.  Persistent high blood pressure should be treated with medicine if weight loss and exercise do not work.  We check your blood pressure as part of your yearly visit.  Avoid drinking alcohol in excess (more than two drinks per day).  Avoid use of street drugs. Do not share needles with anyone. Ask for professional help if you need assistance or instructions on stopping the use of alcohol, cigarettes, and/or drugs.  Brush your teeth twice a day with fluoride toothpaste, and floss once a day. Good oral hygiene prevents tooth decay and gum disease. The problems can be painful, unattractive, and can cause other health problems. Visit your dentist for a routine oral and dental checkup and preventive care every 6-12 months.   See your eye doctor yearly for routine screening for  things like glaucoma.  Look at your skin regularly.  Use a mirror to look at your back. Notify your caregivers of changes in moles, especially if there are changes in shapes, colors, a size larger than a pencil eraser, an irregular border, or development of new moles.  Safety:  Use seatbelts 100% of the time, whether driving or as a passenger.  Use safety devices such as hearing protection if you work in environments with loud noise or significant background noise.  Use safety glasses when doing any work that could send debris in to the eyes.  Use a helmet if you ride a bike or motorcycle.  Use appropriate safety gear for contact sports.  Talk to your caregiver about gun safety.  Use sunscreen with a SPF (or skin protection factor) of 15 or greater.  Lighter skinned people are at a greater risk of skin cancer. Don't forget to also wear sunglasses in order to protect your eyes from too much damaging sunlight. Damaging sunlight can accelerate cataract formation.   If you have multiple sexual partners, or if you are not in a monogamous relationship, practice safe sex. Use condoms. Condoms are used to help reduce the spread of sexually transmitted infections (or STIs).  Consider an HIV test if you have never been tested.  Consider routine screening for STIs if you have multiple sexual partners.   Keep carbon monoxide and smoke detectors in your home functioning at all times. Change the batteries every 6 months or use a model that plugs into the wall or is hard wired in.   END OF LIFE PLANNING/ADVANCED DIRECTIVES Advance health-care planning is deciding the kind of care you want at the end of life. While alert competent adults are able to exercise their rights to make health care and financial decisions, problems arise when an individual becomes unconscious, incapacitated, or otherwise unable to communicate or make such decisions. Advance health care directives are the legal documents in which you give  written instructions about your choices limited, aggressive or palliative care if, in the future, you cannot speak for yourself.  Advanced directives include the following: [place] allows you to appoint someone to act as your health care agent  to make health care decisions for you should it be determined by your health care provider that you are no longer able to make these decisions for yourself.  A Living Will is a legal document in which you can declare that under certain conditions you desire your life not be prolonged by extraordinary or artificial means during your last illness or when you are near death. We can provide you with sample advanced directives, you can get an attorney to prepare these for you, or you can visit [place] Secretary of State's website for additional information and resources at [email or web address]  Further, I recommend you have an attorney prepare a Will and Durable Power of Attorney if you haven't done so already.  Please get [place] a copy of your health care Advanced Directives.   PREVENTATIV E CARE RECOMMENDATIONS:  Vaccinations: We recommend the following vaccinations as part of your preventative care:  Pneumococcal vaccine is recommended to protect against certain types of pneumonia.  This is normally recommended for adults age 73 or older once, or up to every 5 years for those at high risk.  The vaccine is also recommended for adults younger than 65 years old with certain underlying conditions that make them high risk for pneumonia.  Influenza vaccine is recommended to protect against seasonal influenza or "the flu." Influenza is a serious disease that can lead to hospitalization and sometimes even death. Traditional flu vaccines (called trivalent vaccines) are made to protect against three flu viruses; an influenza A (H1N1) virus, an influenza A (H3N2) virus, and an influenza B virus. In addition, there are flu vaccines made to  protect against four flu viruses (called "quadrivalent" vaccines). These vaccines protect against the same viruses as the trivalent vaccine and an additional B virus.  We recommend the high dose influenza vaccine to those 65 years and older.  Hepatitis B vaccine to protect against a form of infection of the liver by a virus acquired from blood or body fluids, particularly for high risk groups.  Td or Tdap vaccine to protect against Tetanus, diphtheria and pertussis which can be very serious.  These diseases are caused by bacteria.  Diphtheria and pertussis are spread from person to person through coughing or sneezing.  Tetanus enters the body through cuts, scratches, or wounds.  Tetanus (Lockjaw) causes painful muscle tightening and stiffness, usually all over the body.  Diphtheria can cause a thick coating to form in the back of the throat.  It can lead to breathing problems, paralysis, heart failure, and death.  Pertussis (Whooping Cough) causes severe coughing spells, which can cause difficulty breathing, vomiting and disturbed sleep.  Td or Tdap is usually given every 10 years.  Shingles vaccine to protect against Varicella Zoster if you are older than age 44, or younger than 65 years old with certain underlying illness.    Cancer Screening: Most routine colon cancer screening begins at the age of 22.  Subsequent colonoscopies are performed either every 5-10 years for normal screening, or every 2-5 years for higher risks patients, up until age 24 years of age. Annual screening is done with easy to use take-home tests to check for hidden blood in the stool called hemoccult tests.  Sigmoidoscopy or colonoscopy can detect the earliest forms of colon cancer and is life saving. These tests use a small camera at the end of a tube to directly examine the colon.   Osteoporosis Screening: Screening for osteoporosis usually begins at age 49 for women, and can be done  as frequent as every 2 years.  However,  women or men with higher risk of osteoporosis may be screened earlier than age 76.  Osteoporosis or low bone mass is diminished bone strength from alterations in bone architecture leading to bone fragility and increased fracture risk.     Cardiovascular Screening: Fat and cholesterol leaves deposits in your arteries that can block them. This causes heart disease and vessel disease elsewhere in your body.  If your cholesterol is found to be high, or if you have heart disease or certain other medical conditions, then you may need to have your cholesterol monitored frequently and be treated with medication. Cardiovascular screening in the form of lab tests for cholesterol, HDL and triglycerides can be done every 5 years.  A screening electrocardiogram can be done as part of the Welcome to Medicare physical.  Diabetes Screening: Diabetes screening can be done at least every 3 years for those with risk factors,  or every 6-77months for prediabetic patients.  Screening includes fasting blood sugar test or glucose tolerance test.  Risk factors include hypertension, dyslipidemia, obesity, previously abnormal glucose tests, family history of diabetes, age 73 years or older, and history of gestations diabetes.   AAA (abdominal aortic aneurysm) Screening: Medicare allows for a one time ultrasound to screen for abdominal aortic aneurysm if done as a referral as part of the Welcome to Medicare exam.  Men eligible for this screening include those men between age 8-66 years of age who have smoked at least 100 cigarettes in his lifetime and/or has a family history of AAA.  HIV Screening:  Medicare allows for yearly screening for patients at high risk for contracting HIV disease.

## 2016-10-04 NOTE — Addendum Note (Signed)
Addended by: Carlena Hurl on: 10/04/2016 10:16 AM   Modules accepted: Level of Service

## 2016-10-04 NOTE — Progress Notes (Signed)
Subjective:    Logan Phillips is a 66 y.o. male who presents for Preventative Services visit and chronic medical problems/med check visit.    Primary Care Provider Tysinger, Camelia Eng, PA-C here for primary care  Current Health Care Team:  No other doctors  Medical Services you may have received from other than [facility] providers in the past year (date may be approximate) none  Exercise Current exercise habits: very active, runs lawn business   Nutrition/Diet Current diet: in general, a "healthy" diet    Depression Screen Depression screen [facility] 2/9 10/04/2016  Decreased Interest 0  PHQ - 2 Score 0    Activities of Daily Living Screen/Functional Status Survey Is the patient deaf or have difficulty hearing?: Yes Does the patient have difficulty seeing, even when wearing glasses/contacts?: No Does the patient have difficulty concentrating, remembering, or making decisions?: No Does the patient have difficulty walking or climbing stairs?: No Does the patient have difficulty dressing or bathing?: No Does the patient have difficulty doing errands alone such as visiting a doctor's office or shopping?: No  [place]  10/04/2016  Falls in the past year? No    Gait Assessment: Normal gait observed yes  Advanced directives Does patient have a [place]? No Does patient have a Living Will? No  Past Medical History:  Diagnosis Date  . Hearing loss    pending hearing aids 06/2014  . Hypertension   . Varicose vein     Past Surgical History:  Procedure Laterality Date  . APPENDECTOMY    . COLONOSCOPY     age 14, repeat 11 years  . LUMBAR DISC SURGERY  2002   Dr. Rita Ohara  . WISDOM TOOTH EXTRACTION      Social History   Social History  . Marital status: Divorced    Spouse name: N/A  . Number of children: N/A  . Years of education: N/A   Occupational History  . Not on file.   Social History Main Topics  . Smoking status: Never  Smoker  . Smokeless tobacco: Never Used  . Alcohol use 19.2 oz/week    30 Cans of beer, 2 Shots of liquor per week  . Drug use: No  . Sexual activity: Not on file   Other Topics Concern  . Not on file   Social History Narrative   Works in [employer], walking at least 4 miles daily, golf, [employer], single, 3 children, 3 girls.  Divorced.  As of 7/ 2018    Family History  Problem Relation Age of Onset  . Hypertension Mother   . Dementia Father   . Parkinsonism Father   . Diabetes Father        early stages  . Cancer Maternal Grandmother        breast  . Heart disease Neg Hx   . Stroke Neg Hx      Current Outpatient Prescriptions:  .  fish oil-omega-3 fatty acids 1000 MG capsule, Take 1 g by mouth daily., Disp: , Rfl:  .  ibuprofen (ADVIL,MOTRIN) 800 MG tablet, Take 1 tablet (800 mg total) by mouth every 8 (eight) hours as needed for moderate pain., Disp: 30 tablet, Rfl: 0 .  lisinopril (PRINIVIL,ZESTRIL) 20 MG tablet, TAKE ONE TABLET BY MOUTH DAILY, Disp: 90 tablet, Rfl: 1 .  metoprolol tartrate (LOPRESSOR) 100 MG tablet, TAKE ONE TABLET BY MOUTH TWICE DAILY, Disp: 180 tablet, Rfl: 1 .  Multiple Vitamin (MULITIVITAMIN WITH MINERALS) TABS,  Take 1 tablet by mouth daily., Disp: , Rfl:   No Known Allergies  History reviewed: allergies, current medications, past family history, past medical history, past social history, past surgical history and problem list  Chronic issues discussed: HTN  - compliant with medication.  Saw Dr. Wynonia Lawman 2016, but didn't go back for echo.  Wants surgery consult for lipomas that bother him. Has 2 on back, one large.   Acute issues discussed: none  Objective:     Biometrics BP 124/84   Pulse 67   Ht 5\' 7"  (1.702 m)   Wt 178 lb 6.4 oz (80.9 kg)   SpO2 98%   BMI 27.94 kg/m   Cognitive Testing  Alert? Yes  Normal Appearance?Yes  Oriented to person? Yes  Place? Yes   Time? Yes  Recall of three objects?  Yes  Can perform simple  calculations? Yes  Displays appropriate judgment?Yes  Can read the correct time from a watch face?Yes  General appearance: alert, no distress, WD/WN, white male  Nutritional Status: Inadequate calore intake? no Loss of muscle mass? no Loss of fat beneath skin? no Localized or general edema? no Diminished functional status? no  Other pertinent exam: HEENT: normocephalic, sclerae anicteric, TMs pearly, nares patent, no discharge or erythema, pharynx normal Skin: left upper back over scapula region with large fatty lump suggestive of lipoma, and left lower back with small 3cm broad fatty lump also suggestive of lipoma, few scattered macules, 3 independent 5-32mm diameter stuck on appearing raised brownish lesions of mid upper back suggestive of seborrheic keratoses, thickened toenails Oral cavity: MMM, no lesions, several teeth missing Neck: supple, no lymphadenopathy, no thyromegaly, no masses, no bruits Heart: RRR, normal S1, S2, no murmurs Lungs: CTA bilaterally, no wheezes, rhonchi, or rales Abdomen: +bs, soft, non tender, non distended, no masses, no hepatomegaly, no splenomegaly Musculoskeletal: nontender, no swelling, no obvious deformity Extremities: no edema, no cyanosis, no clubbing Pulses: 2+ symmetric, upper and lower extremities, normal cap refill Neurological: alert, oriented x 3, CN2-12 intact, strength normal upper extremities and lower extremities, sensation normal throughout, DTRs 2+ throughout, no cerebellar signs, gait normal Psychiatric: normal affect, behavior normal, pleasant  GU: normal male external genitalia, circumcised, nontender, no masses, no hernia, no lymphadenopathy Rectal: anus normal tone, prostate mildly enlarged    Assessment:   Encounter Diagnoses  Name Primary?  . Initial Medicare annual wellness visit Yes  . Essential hypertension   . Varicose veins of left lower extremity   . Vaccine counseling   . Screen for colon cancer   . Screening for  prostate cancer   . Screening for lipid disorders   . Screening for diabetes mellitus   . Screening for heart disease   . Abnormal EKG   . Need for pneumococcal vaccination   . Lipoma of torso   . Hearing difficulty of both ears      Plan:   A preventative services visit was completed today.  During the course of the visit today, we discussed and counseled about appropriate screening and preventive services.  A health risk assessment was established today that included a review of current medications, allergies, social history, family history, medical and preventative health history, biometrics, and preventative screenings to identify potential safety concerns or impairments.  A personalized plan was printed today for your records and use.   Personalized health advice and education was given today to reduce health risks and promote self management and wellness.  Information regarding end of life planning was  discussed today.  Conditions/risks identified: History of alcohol abuse - counseled  Hearing loss - advised audiology consult  Chronic problems discussed today: HTN - c/t same medications  Abnormal EKG - refer for echocardiogram  Varicose veins - consider compression hose, c/t routine exercise  Lipoma - he will check with plastic surgery about liposuction, but will likely call back for general surgery consult referral  Hearing difficulty - advised audiology consult.  He will let me know   Acute problems discussed today: None  Recommendations:  I recommend a yearly ophthalmology/optometry visit for glaucoma screening and eye checkup  I recommended a yearly dental visit for hygiene and checkup  Advanced directives - discussed nature and purpose of Advanced Directives, encouraged them to complete them if they have not done so and/or encouraged them to get [place] a copy if they have done this already.  Referrals today: GI for colonoscopy Echocardiogram Pending call back  from patient, possible general surgery referral  Immunizations: I recommended a yearly influenza vaccine, typically in September when the vaccine is usually available Counseled on shingrix   Is the Td/Tdap vaccine up to date: yes. Counseled on the pneumococcal vaccine.  Vaccine information sheet given.  Pneumococcal vaccine Prevnar 13 given after consent obtained.   Medicare Attestation A preventative services visit was completed today.  During the course of the visit the patient was educated and counseled about appropriate screening and preventive services.  A health risk assessment was established with the patient that included a review of current medications, allergies, social history, family history, medical and preventative health history, biometrics, and preventative screenings to identify potential safety concerns or impairments.  A personalized plan was printed today for the patient's records and use.   Personalized health advice and education was given today to reduce health risks and promote self management and wellness.  Information regarding end of life planning was discussed today.  Crisoforo Oxford, PA-C   10/04/2016

## 2016-10-05 ENCOUNTER — Telehealth: Payer: Self-pay

## 2016-10-05 LAB — HEMOGLOBIN A1C
HEMOGLOBIN A1C: 4.6 % (ref <5.7)
Mean Plasma Glucose: 85 mg/dL

## 2016-10-05 LAB — PSA: PSA: 1 ng/mL (ref <=4.0)

## 2016-10-05 NOTE — Telephone Encounter (Signed)
Called and spoke with [employer] no Josem Kaufmann is required  Per steve m.  For his echo, pt has been set up for this Friday 10/09/26 @ 1pm pt is aware of appt.

## 2016-10-08 ENCOUNTER — Telehealth: Payer: Self-pay | Admitting: Medical

## 2016-10-08 ENCOUNTER — Ambulatory Visit (HOSPITAL_COMMUNITY)
Admission: RE | Admit: 2016-10-08 | Discharge: 2016-10-08 | Disposition: A | Discharge status: Home / Self Care | Payer: Medicare HMO | Source: Ambulatory Visit | Attending: Medical | Admitting: Medical

## 2016-10-08 DIAGNOSIS — Z9289 Personal history of other medical treatment: Secondary | ICD-10-CM

## 2016-10-08 DIAGNOSIS — I10 Essential (primary) hypertension: Secondary | ICD-10-CM | POA: Diagnosis not present

## 2016-10-08 DIAGNOSIS — R9431 Abnormal electrocardiogram [ECG] [EKG]: Secondary | ICD-10-CM | POA: Diagnosis not present

## 2016-10-08 HISTORY — DX: Personal history of other medical treatment: Z92.89

## 2016-10-08 NOTE — Progress Notes (Signed)
  Echocardiogram 2D Echocardiogram has been performed.  Othmar Ringer 10/08/2016, 1:45 PM

## 2016-10-08 NOTE — Telephone Encounter (Signed)
Pt was given the results of echo that heart looks good per Wells Guiles

## 2016-11-04 ENCOUNTER — Encounter: Payer: Self-pay | Admitting: Internal Medicine

## 2017-01-04 ENCOUNTER — Encounter: Payer: Medicare HMO | Admitting: Internal Medicine

## 2017-03-09 DIAGNOSIS — M75101 Unspecified rotator cuff tear or rupture of right shoulder, not specified as traumatic: Secondary | ICD-10-CM | POA: Diagnosis not present

## 2017-03-13 ENCOUNTER — Other Ambulatory Visit: Payer: Self-pay | Admitting: Medical

## 2017-03-14 NOTE — Telephone Encounter (Signed)
Called and l/m for pt call us set up an appt for refills on meds

## 2017-03-16 ENCOUNTER — Telehealth: Payer: Self-pay | Admitting: Medical

## 2017-03-16 ENCOUNTER — Other Ambulatory Visit: Payer: Self-pay | Admitting: Family Medicine

## 2017-03-16 MED ORDER — METOPROLOL TARTRATE 100 MG PO TABS: 100.0000 mg | ORAL_TABLET | Freq: Two times a day (BID) | ORAL | 2 refills | Status: DC

## 2017-03-16 NOTE — Telephone Encounter (Signed)
Pt called and made an appt needs refills on lopressor. He is completely out.

## 2017-03-18 ENCOUNTER — Telehealth: Payer: Self-pay | Admitting: Medical

## 2017-03-18 ENCOUNTER — Ambulatory Visit (INDEPENDENT_AMBULATORY_CARE_PROVIDER_SITE_OTHER): Payer: Medicare HMO | Admitting: Medical

## 2017-03-18 ENCOUNTER — Encounter: Payer: Self-pay | Admitting: Medical

## 2017-03-18 VITALS — BP 118/82 | HR 62 | Wt 179.0 lb

## 2017-03-18 DIAGNOSIS — I10 Essential (primary) hypertension: Secondary | ICD-10-CM

## 2017-03-18 DIAGNOSIS — Z7185 Encounter for immunization safety counseling: Secondary | ICD-10-CM

## 2017-03-18 DIAGNOSIS — Z1211 Encounter for screening for malignant neoplasm of colon: Secondary | ICD-10-CM

## 2017-03-18 DIAGNOSIS — Z7189 Other specified counseling: Secondary | ICD-10-CM

## 2017-03-18 DIAGNOSIS — M25511 Pain in right shoulder: Secondary | ICD-10-CM | POA: Diagnosis not present

## 2017-03-18 DIAGNOSIS — G8929 Other chronic pain: Secondary | ICD-10-CM | POA: Diagnosis not present

## 2017-03-18 MED ORDER — LISINOPRIL 20 MG PO TABS: 20.0000 mg | ORAL_TABLET | Freq: Every day | ORAL | 2 refills | Status: DC

## 2017-03-18 MED ORDER — DICLOFENAC SODIUM 1 % TD GEL: 4.0000 g | Freq: Four times a day (QID) | TRANSDERMAL | 1 refills | Status: DC

## 2017-03-18 MED ORDER — HYDROCODONE-ACETAMINOPHEN 10-325 MG PO TABS: 1.0000 | ORAL_TABLET | Freq: Three times a day (TID) | ORAL | 0 refills | Status: DC | PRN

## 2017-03-18 NOTE — Telephone Encounter (Signed)
Logan Phillips Refer again for colonoscopy.  He was suppose to do this last year but due to scheduling conflict, it didn't happen  We had discussed started Shingrix today but after talking about his shoulder, we accidentally looked over this.  He can return at his convenience to do the 2 shot Shingrix vaccine, each shot 2 mo apart.

## 2017-03-18 NOTE — Progress Notes (Signed)
Subjective: Chief Complaint  Patient presents with  . med check    med check    Here for med check and right shoulder pain.   Hypertension-compliant with medication.  He denies chest pain, shortness of breath, edema. given his last visit concerns including abnormal EKG he went for echocardiogram of the heart which was normal  He declines a flu shot today  He wants to discuss the shingles vaccine we did discuss last visit  He was supposed to have colonoscopy last year but due to scheduling conflicts it never happened.  He is ready to do this  He reports chronic right shoulder pain, occasionally has flareups depending on the type of work he is doing.  He saw orthopedist in the past about this, was going to pursue MRI but held off at the time.  Lately been having more pain than usual particular with rotation or overhead motion.  He is right-handed.  No numbness, tingling, weakness.  He does go to the gym regularly does weights, does a lot of work with physical labor on the job.  No other aggravating or relieving factors. No other complaint.  Past Medical History:  Diagnosis Date  . Hearing loss    pending hearing aids 06/2014  . Hypertension   . Varicose vein    Current Outpatient Medications on File Prior to Visit  Medication Sig Dispense Refill  . fish oil-omega-3 fatty acids 1000 MG capsule Take 1 g by mouth daily.    Marland Kitchen ibuprofen (ADVIL,MOTRIN) 800 MG tablet Take 1 tablet (800 mg total) by mouth every 8 (eight) hours as needed for moderate pain. 30 tablet 0  . metoprolol tartrate (LOPRESSOR) 100 MG tablet Take 1 tablet (100 mg total) by mouth 2 (two) times daily. 180 tablet 2  . Multiple Vitamin (MULITIVITAMIN WITH MINERALS) TABS Take 1 tablet by mouth daily.     No current facility-administered medications on file prior to visit.    ROS as in subjective   Objective: BP 118/82   Pulse 62   Wt 179 lb (81.2 kg)   SpO2 98%   BMI 28.04 kg/m   General appearance: alert, no  distress, WD/WN,  Neck: supple, no lymphadenopathy, no thyromegaly, no masses, normal range of motion, nontender Heart: RRR, normal S1, S2, no murmurs Lungs: CTA bilaterally, no wheezes, rhonchi, or rales Abdomen: +bs, soft, non tender, non distended, no masses, no hepatomegaly, no splenomegaly Pulses: 2+ symmetric, upper and lower extremities, normal cap refill Ext: no edema MSK: Tender over right anterior shoulder, pain with range of motion in general, mild pain with empty can test and resisted shoulder flexion, mildly reduced internal and external range of motion, no laxity or other obvious abnormality rest of arms unremarkable Arms neurovascularly intact    Assessment: Encounter Diagnoses  Name Primary?  . Essential hypertension Yes  . Chronic right shoulder pain   . Screen for colon cancer   . Vaccine counseling     Plan: Hypertension-at goal, continue current medication, reviewed echocardiogram from July 2018  Chronic right shoulder pain- likely some tendinitis versus inflammation.  Short-term medication given , advised he follow-up with his orthopedist.  Refer again for updated colonoscopy as he missed this this past year due to scheduling conflict  Advised shingles vaccine and flu shot.  He declines flu shot today will check insurance coverage on shingles vaccine.  Logan Phillips was seen today for med check.  Diagnoses and all orders for this visit:  Essential hypertension  Chronic right shoulder  pain  Screen for colon cancer  Vaccine counseling  Other orders -     lisinopril (PRINIVIL,ZESTRIL) 20 MG tablet; Take 1 tablet (20 mg total) by mouth daily. -     diclofenac sodium (VOLTAREN) 1 % GEL; Apply 4 g topically 4 (four) times daily. -     HYDROcodone-acetaminophen (NORCO) 10-325 MG tablet; Take 1 tablet by mouth every 8 (eight) hours as needed.

## 2017-03-21 ENCOUNTER — Encounter: Payer: Self-pay | Admitting: Gastroenterology

## 2017-03-21 ENCOUNTER — Other Ambulatory Visit (INDEPENDENT_AMBULATORY_CARE_PROVIDER_SITE_OTHER): Payer: Medicare HMO

## 2017-03-21 DIAGNOSIS — Z23 Encounter for immunization: Secondary | ICD-10-CM | POA: Diagnosis not present

## 2017-03-21 NOTE — Addendum Note (Signed)
Addended by: Tyrone Apple on: 03/21/2017 11:56 AM   Modules accepted: Orders

## 2017-03-21 NOTE — Telephone Encounter (Signed)
Sent referral to gi for colonoscopy, and called pt made an appt for shingrix

## 2017-03-27 ENCOUNTER — Telehealth: Payer: Self-pay | Admitting: Medical

## 2017-03-27 NOTE — Telephone Encounter (Signed)
P.A. DICLOFENAC GEL  °

## 2017-04-03 NOTE — Telephone Encounter (Signed)
P.A. Approved til 03/14/18, left message for pt

## 2017-05-02 ENCOUNTER — Telehealth: Payer: Self-pay

## 2017-05-02 NOTE — Telephone Encounter (Signed)
Patient No Showed for Pre-Visit.  A message was left on his voice mail to call and reschedule PV before 5:00 Pm today. If patient does not call before 5:00 Pm his colonoscopy that is scheduled for 05/16/17 will be cancelled per [place] guidelines. A no show letter will be mailed if patient does not reschedule.   Riki Sheer, LPN  ( PV )

## 2017-05-03 ENCOUNTER — Encounter: Payer: Self-pay | Admitting: Medical

## 2017-05-16 ENCOUNTER — Encounter: Payer: Medicare HMO | Admitting: Gastroenterology

## 2017-06-09 ENCOUNTER — Other Ambulatory Visit: Payer: Medicare HMO

## 2017-06-14 ENCOUNTER — Other Ambulatory Visit: Payer: Medicare HMO

## 2017-06-14 ENCOUNTER — Other Ambulatory Visit: Payer: Self-pay

## 2017-06-14 DIAGNOSIS — Z23 Encounter for immunization: Secondary | ICD-10-CM

## 2017-08-03 ENCOUNTER — Ambulatory Visit (INDEPENDENT_AMBULATORY_CARE_PROVIDER_SITE_OTHER): Payer: Medicare HMO | Admitting: Family Medicine

## 2017-08-03 ENCOUNTER — Encounter: Payer: Self-pay | Admitting: Family Medicine

## 2017-08-03 VITALS — BP 124/80 | HR 74 | Temp 98.3°F | Wt 179.4 lb

## 2017-08-03 DIAGNOSIS — G8929 Other chronic pain: Secondary | ICD-10-CM | POA: Diagnosis not present

## 2017-08-03 DIAGNOSIS — M25511 Pain in right shoulder: Secondary | ICD-10-CM

## 2017-08-03 MED ORDER — NAPROXEN 500 MG PO TABS: 500.0000 mg | ORAL_TABLET | Freq: Two times a day (BID) | ORAL | 0 refills | Status: DC

## 2017-08-03 NOTE — Progress Notes (Signed)
   Subjective:    Patient ID: Logan Phillips, male    DOB: February 21, 1951, 67 y.o.   MRN: [id number]  HPI Chief Complaint  Patient presents with  . shoulder pain    shoulder pain, 4-5 months not getting better    He is here with complaints of chronic right shoulder pain that he has seen Audelia Acton for in January and Dr. Noemi Chapel. States he is supposed to schedule an MRI of his right shoulder in a couple of weeks. Requests pain medication for this. States he did not call his orthopedist to request pain medication.   Denies any new symptoms. Pain is mainly anterior and with certain movements such as abduction and adduction. 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   Denies fever, chills, numbness, tingling or weakness.   Reviewed allergies, medications, past medical, surgical, family, and social history.   Review of Systems     Objective:   Physical Exam BP 124/80   Pulse 74   Temp 98.3 F (36.8 C) (Oral)   Wt 179 lb 6.4 oz (81.4 kg)   SpO2 99%   BMI 28.10 kg/m   Alert and oriented in no acute distress.  No shoulder asymmetry or weakness.  No sulcus or laxity.  Negative drop arm test.  Negative Neer's and Hawkins.  Right upper extremity is neurovascularly intact.      Assessment & Plan:  Chronic right shoulder pain - Plan: naproxen (NAPROSYN) 500 MG tablet  Denies any new symptoms.  Reviewed Dorothea Ogle, his PCP notes and he was advised to follow-up with Ortho for pain and treatment. He reports being under the care of Dr. Noemi Chapel although he did not contact him today to request pain medication.  I will refill his naproxen and have him contact Dr. Para March for the hydrocodone which he is requesting.  I did check the narcotic drug database and there are no aberrancies.

## 2017-08-04 ENCOUNTER — Ambulatory Visit: Payer: Self-pay | Admitting: Family Medicine

## 2017-08-15 DIAGNOSIS — M25511 Pain in right shoulder: Secondary | ICD-10-CM | POA: Diagnosis not present

## 2017-08-16 DIAGNOSIS — M25511 Pain in right shoulder: Secondary | ICD-10-CM | POA: Diagnosis not present

## 2017-10-05 ENCOUNTER — Ambulatory Visit (INDEPENDENT_AMBULATORY_CARE_PROVIDER_SITE_OTHER): Payer: Medicare HMO | Admitting: Medical

## 2017-10-05 ENCOUNTER — Encounter: Payer: Self-pay | Admitting: Medical

## 2017-10-05 VITALS — BP 130/80 | HR 58 | Temp 98.2°F | Resp 16 | Ht 68.0 in | Wt 182.0 lb

## 2017-10-05 DIAGNOSIS — I8392 Asymptomatic varicose veins of left lower extremity: Secondary | ICD-10-CM

## 2017-10-05 DIAGNOSIS — Z7185 Encounter for immunization safety counseling: Secondary | ICD-10-CM

## 2017-10-05 DIAGNOSIS — Z131 Encounter for screening for diabetes mellitus: Secondary | ICD-10-CM

## 2017-10-05 DIAGNOSIS — I10 Essential (primary) hypertension: Secondary | ICD-10-CM

## 2017-10-05 DIAGNOSIS — M25511 Pain in right shoulder: Secondary | ICD-10-CM

## 2017-10-05 DIAGNOSIS — Z0000 Encounter for general adult medical examination without abnormal findings: Secondary | ICD-10-CM | POA: Diagnosis not present

## 2017-10-05 DIAGNOSIS — R270 Ataxia, unspecified: Secondary | ICD-10-CM | POA: Diagnosis not present

## 2017-10-05 DIAGNOSIS — R42 Dizziness and giddiness: Secondary | ICD-10-CM

## 2017-10-05 DIAGNOSIS — G8929 Other chronic pain: Secondary | ICD-10-CM

## 2017-10-05 DIAGNOSIS — H9193 Unspecified hearing loss, bilateral: Secondary | ICD-10-CM

## 2017-10-05 DIAGNOSIS — R413 Other amnesia: Secondary | ICD-10-CM

## 2017-10-05 DIAGNOSIS — Z23 Encounter for immunization: Secondary | ICD-10-CM

## 2017-10-05 DIAGNOSIS — D171 Benign lipomatous neoplasm of skin and subcutaneous tissue of trunk: Secondary | ICD-10-CM

## 2017-10-05 DIAGNOSIS — L989 Disorder of the skin and subcutaneous tissue, unspecified: Secondary | ICD-10-CM

## 2017-10-05 DIAGNOSIS — Z7189 Other specified counseling: Secondary | ICD-10-CM

## 2017-10-05 DIAGNOSIS — Z1211 Encounter for screening for malignant neoplasm of colon: Secondary | ICD-10-CM | POA: Diagnosis not present

## 2017-10-05 DIAGNOSIS — Z1159 Encounter for screening for other viral diseases: Secondary | ICD-10-CM | POA: Diagnosis not present

## 2017-10-05 LAB — POCT URINALYSIS DIP (PROADVANTAGE DEVICE)
BILIRUBIN UA: NEGATIVE
Glucose, UA: NEGATIVE mg/dL
Ketones, POC UA: NEGATIVE mg/dL
NITRITE UA: NEGATIVE
PH UA: 6 (ref 5.0–8.0)
PROTEIN UA: NEGATIVE mg/dL
RBC UA: NEGATIVE
Specific Gravity, Urine: 1.02
UUROB: 3.5

## 2017-10-05 MED ORDER — MELOXICAM 15 MG PO TABS: 15.0000 mg | ORAL_TABLET | Freq: Every day | ORAL | 2 refills | Status: DC

## 2017-10-05 NOTE — Patient Instructions (Addendum)
Recommendations:  I recommend you go ahead and call the gastroenterologist to schedule your endoscopy and colonoscopy as you are due  I would like to refer you to dermatology for skin surveillance or if you want to call and make the appointment at [facility] dermatology you can   please establish with an eye doctor for routine eye care  We will call you with lab results  I prescribed meloxicam to use once daily for arthritis pain and shoulder pain  We updated your pneumococcal 23 vaccine today  We recommend a yearly flu shot in the fall  If your labs are normal we may want to consider a head scan given your recent symptoms including blurred vision, dizziness, disequilibrium, and not feeling well  I recommend you drink no more than 1 alcoholic beverage on a given day    Thanks for trusting [place] with your health care and for coming in for a physical today.   Yearly screenings See your eye doctor yearly for routine vision care. See your dentist yearly for routine dental care including hygiene visits twice yearly. See me here yearly for a routine physical and preventative care visit   Please follow up yearly for a physical.   Preventative Care for Adults - Male      [place]:  A routine yearly physical is a good way to check in with your primary care provider about your health and preventive screening. It is also an opportunity to share updates about your health and any concerns you have, and receive a thorough all-over exam.   Most health insurance companies pay for at least some preventative services.  Check with your health plan for specific coverages.  WHAT PREVENTATIVE SERVICES DO MEN NEED?  Adult men should have their weight and blood pressure checked regularly.   Men age 66 and older should have their cholesterol levels checked regularly.  Beginning at age 6 and continuing to age 65, men should be screened for colorectal cancer.  Certain people  may need continued testing until age 26.  Updating vaccinations is part of preventative care.  Vaccinations help protect against diseases such as the flu.  Osteoporosis is a disease in which the bones lose minerals and strength as we age. Men ages 46 and over should discuss this with their caregivers  Lab tests are generally done as part of preventative care to screen for anemia and blood disorders, to screen for problems with the kidneys and liver, to screen for bladder problems, to check blood sugar, and to check your cholesterol level.  Preventative services generally include counseling about diet, exercise, avoiding tobacco, drugs, excessive alcohol consumption, and sexually transmitted infections.    GENERAL RECOMMENDATIONS FOR GOOD HEALTH:  Healthy diet:  Eat a variety of foods, including fruit, vegetables, animal or vegetable protein, such as meat, fish, chicken, and eggs, or beans, lentils, tofu, and grains, such as rice.  Drink plenty of water daily.  Decrease saturated fat in the diet, avoid lots of red meat, processed foods, sweets, fast foods, and fried foods.  Exercise:  Aerobic exercise helps maintain good heart health. At least 30-40 minutes of moderate-intensity exercise is recommended. For example, a brisk walk that increases your heart rate and breathing. This should be done on most days of the week.   Find a type of exercise or a variety of exercises that you enjoy so that it becomes a part of your daily life.  Examples are running, walking, swimming, water aerobics, and  biking.  For motivation and support, explore group exercise such as aerobic class, spin class, Zumba, Yoga,or  martial arts, etc.    Set exercise goals for yourself, such as a certain weight goal, walk or run in a race such as a 5k walk/run.  Speak to your primary care provider about exercise goals.  Disease prevention:  If you smoke or chew tobacco, find out from your caregiver how to quit. It can  literally save your life, no matter how long you have been a tobacco user. If you do not use tobacco, never begin.   Maintain a healthy diet and normal weight. Increased weight leads to problems with blood pressure and diabetes.   The Body Mass Index or BMI is a way of measuring how much of your body is fat. Having a BMI above 27 increases the risk of heart disease, diabetes, hypertension, stroke and other problems related to obesity. Your caregiver can help determine your BMI and based on it develop an exercise and dietary program to help you achieve or maintain this important measurement at a healthful level.  High blood pressure causes heart and blood vessel problems.  Persistent high blood pressure should be treated with medicine if weight loss and exercise do not work.   Fat and cholesterol leaves deposits in your arteries that can block them. This causes heart disease and vessel disease elsewhere in your body.  If your cholesterol is found to be high, or if you have heart disease or certain other medical conditions, then you may need to have your cholesterol monitored frequently and be treated with medication.   Ask if you should have a cardiac stress test if your history suggests this. A stress test is a test done on a treadmill that looks for heart disease. This test can find disease prior to there being a problem.  Osteoporosis is a disease in which the bones lose minerals and strength as we age. This can result in serious bone fractures. Risk of osteoporosis can be identified using a bone density scan. Men ages 8 and over should discuss this with their caregivers. Ask your caregiver whether you should be taking a calcium supplement and Vitamin D, to reduce the rate of osteoporosis.   Avoid drinking alcohol in excess (more than two drinks per day).  Avoid use of street drugs. Do not share needles with anyone. Ask for professional help if you need assistance or instructions on stopping the use  of alcohol, cigarettes, and/or drugs.  Brush your teeth twice a day with fluoride toothpaste, and floss once a day. Good oral hygiene prevents tooth decay and gum disease. The problems can be painful, unattractive, and can cause other health problems. Visit your dentist for a routine oral and dental check up and preventive care every 6-12 months.   Look at your skin regularly.  Use a mirror to look at your back. Notify your caregivers of changes in moles, especially if there are changes in shapes, colors, a size larger than a pencil eraser, an irregular border, or development of new moles.  Safety:  Use seatbelts 100% of the time, whether driving or as a passenger.  Use safety devices such as hearing protection if you work in environments with loud noise or significant background noise.  Use safety glasses when doing any work that could send debris in to the eyes.  Use a helmet if you ride a bike or motorcycle.  Use appropriate safety gear for contact sports.  Talk to  your caregiver about gun safety.  Use sunscreen with a SPF (or skin protection factor) of 15 or greater.  Lighter skinned people are at a greater risk of skin cancer. Don't forget to also wear sunglasses in order to protect your eyes from too much damaging sunlight. Damaging sunlight can accelerate cataract formation.   Practice safe sex. Use condoms. Condoms are used for birth control and to help reduce the spread of sexually transmitted infections (or STIs).  Some of the STIs are gonorrhea (the clap), chlamydia, syphilis, trichomonas, herpes, HPV (human papilloma virus) and HIV (human immunodeficiency virus) which causes AIDS. The herpes, HIV and HPV are viral illnesses that have no cure. These can result in disability, cancer and death.   Keep carbon monoxide and smoke detectors in your home functioning at all times. Change the batteries every 6 months or use a model that plugs into the wall.   Vaccinations:  Stay up to date with  your tetanus shots and other required immunizations. You should have a booster for tetanus every 10 years. Be sure to get your flu shot every year, since 5%-20% of the [place] population comes down with the flu. The flu vaccine changes each year, so being vaccinated once is not enough. Get your shot in the fall, before the flu season peaks.   Other vaccines to consider:  Human Papilloma Virus or HPV causes cancer of the cervix, and other infections that can be transmitted from person to person. There is a vaccine for HPV, and males should get immunized between the ages of 21 and 86. It requires a series of 3 shots.   Pneumococcal vaccine to protect against certain types of pneumonia.  This is normally recommended for adults age 12 or older.  However, adults younger than 67 years old with certain underlying conditions such as diabetes, heart or lung disease should also receive the vaccine.  Shingles vaccine to protect against Varicella Zoster if you are older than age 79, or younger than 66 years old with certain underlying illness.  If you have not had the Shingrix vaccine, please call your insurer to inquire about coverage for the Shingrix vaccine given in 2 doses.   Some insurers cover this vaccine after age 65, some cover this after age 72.  If your insurer covers this, then call to schedule appointment to have this vaccine here  Hepatitis A vaccine to protect against a form of infection of the liver by a virus acquired from food.  Hepatitis B vaccine to protect against a form of infection of the liver by a virus acquired from blood or body fluids, particularly if you work in health care.  If you plan to travel internationally, check with your local health department for specific vaccination recommendations.   What should I know about cancer screening? Many types of cancers can be detected early and may often be prevented. Lung Cancer  You should be screened every year for lung cancer if: ? You  are a current smoker who has smoked for at least 30 years. ? You are a former smoker who has quit within the past 15 years.  Talk to your health care provider about your screening options, when you should start screening, and how often you should be screened.  Colorectal Cancer  Routine colorectal cancer screening usually begins at 66 years of age and should be repeated every 5-10 years until you are 66 years old. You may need to be screened more often if early forms of  precancerous polyps or small growths are found. Your health care provider may recommend screening at an earlier age if you have risk factors for colon cancer.  Your health care provider may recommend using home test kits to check for hidden blood in the stool.  A small camera at the end of a tube can be used to examine your colon (sigmoidoscopy or colonoscopy). This checks for the earliest forms of colorectal cancer.  Prostate and Testicular Cancer  Depending on your age and overall health, your health care provider may do certain tests to screen for prostate and testicular cancer.  Talk to your health care provider about any symptoms or concerns you have about testicular or prostate cancer.  Skin Cancer  Check your skin from head to toe regularly.  Tell your health care provider about any new moles or changes in moles, especially if: ? There is a change in a mole's size, shape, or color. ? You have a mole that is larger than a pencil eraser.  Always use sunscreen. Apply sunscreen liberally and repeat throughout the day.  Protect yourself by wearing long sleeves, pants, a wide-brimmed hat, and sunglasses when outside.

## 2017-10-05 NOTE — Progress Notes (Signed)
Subjective:    Logan Phillips is a 67 y.o. male who presents for Preventative Services visit and chronic medical problems/med check visit.    Primary Care Provider Council Munguia, Camelia Eng, PA-C here for primary care  Current Health Care Team:  Dentist  Dr. 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   Nutrition/Diet Current diet: in general, a "healthy" diet    Depression Screen Depression screen [facility] 2/9 10/05/2017  Decreased Interest 0  Down, Depressed, Hopeless 0  PHQ - 2 Score 0    Activities of Daily Living Screen/Functional Status Survey Is the patient deaf or have difficulty hearing?: No Does the patient have difficulty seeing, even when wearing glasses/contacts?: Yes Does the patient have difficulty concentrating, remembering, or making decisions?: No Does the patient have difficulty walking or climbing stairs?: No Does the patient have difficulty dressing or bathing?: No Does the patient have difficulty doing errands alone such as visiting a doctor's office or shopping?: No  Can patient draw a clock face showing 3:15 oclock, yes  Fall Risk Screen Fall Risk  10/05/2017 10/04/2016  Falls in the past year? No No    Gait Assessment: Normal gait observed yes  Advanced directives Does patient have a [place]? No Does patient have a Living Will? No  But has the paperwork at home, discussing with daughter  Past Medical History:  Diagnosis Date  . Chronic pain in shoulder 2018   [place]  . H/O echocardiogram 10/08/2016   EF 65-70%, normal wall motion, systolic function vigorous  . Hearing loss    pending hearing aids 06/2014  . Hypertension   . Lipoma   . Varicose vein     Past Surgical History:  Procedure  Laterality Date  . APPENDECTOMY    . COLONOSCOPY     age 66, he did not f/u with referral 2018  . LUMBAR DISC SURGERY  2002   Dr. Rita Ohara  . WISDOM TOOTH EXTRACTION      Social History   Socioeconomic History  . Marital status: Divorced    Spouse name: Not on file  . Number of children: Not on file  . Years of education: Not on file  . Highest education level: Not on file  Occupational History  . Not on file  Social Needs  . Financial resource strain: Not on file  . Food insecurity:    Worry: Not on file    Inability: Not on file  . Transportation needs:    Medical: Not on file    Non-medical: Not on file  Tobacco Use  . Smoking status: Never Smoker  . Smokeless tobacco: Never Used  Substance and Sexual Activity  . Alcohol use: Yes    Alcohol/week: 19.2 oz    Types: 30 Cans of beer, 2 Shots of liquor per week  . Drug use: No  . Sexual activity: Not on file  Lifestyle  . Physical activity:    Days per week: Not on file    Minutes per session: Not on file  . Stress: Not on file  Relationships  . Social connections:    Talks on phone: Not on file    Gets together: Not on file    Attends religious service: Not on file  Active member of club or organization: Not on file    Attends meetings of clubs or organizations: Not on file    Relationship status: Not on file  . Intimate partner violence:    Fear of current or ex partner: Not on file    Emotionally abused: Not on file    Physically abused: Not on file    Forced sexual activity: Not on file  Other Topics Concern  . Not on file  Social History Narrative   Works in [employer], walking at least 4 miles daily, golf, [employer], single, 3 children, 3 girls.  Divorced.  As of 7/ 2019    Family History  Problem Relation Age of Onset  . Hypertension Mother   . Dementia Father   . Parkinsonism Father   . Diabetes Father        early stages  . Cancer Maternal Grandmother        breast  . Heart disease Neg Hx    . Stroke Neg Hx      Current Outpatient Medications:  .  Calcium Carbonate-Vitamin D (CALCIUM 500 + D) 500-125 MG-UNIT TABS, Take by mouth., Disp: , Rfl:  .  diclofenac sodium (VOLTAREN) 1 % GEL, Apply 4 g topically 4 (four) times daily., Disp: 100 g, Rfl: 1 .  fish oil-omega-3 fatty acids 1000 MG capsule, Take 1 g by mouth daily., Disp: , Rfl:  .  HYDROcodone-acetaminophen (NORCO) 10-325 MG tablet, Take 1 tablet by mouth every 8 (eight) hours as needed., Disp: 20 tablet, Rfl: 0 .  ibuprofen (ADVIL,MOTRIN) 800 MG tablet, Take 1 tablet (800 mg total) by mouth every 8 (eight) hours as needed for moderate pain., Disp: 30 tablet, Rfl: 0 .  lisinopril (PRINIVIL,ZESTRIL) 20 MG tablet, Take 1 tablet (20 mg total) by mouth daily., Disp: 90 tablet, Rfl: 2 .  magnesium oxide (MAG-OX) 400 MG tablet, Take 400 mg by mouth 2 (two) times daily., Disp: , Rfl:  .  metoprolol tartrate (LOPRESSOR) 100 MG tablet, Take 1 tablet (100 mg total) by mouth 2 (two) times daily., Disp: 180 tablet, Rfl: 2 .  milk thistle 175 MG tablet, Take 175 mg by mouth daily., Disp: , Rfl:  .  Multiple Vitamin (MULITIVITAMIN WITH MINERALS) TABS, Take 1 tablet by mouth daily., Disp: , Rfl:  .  naproxen (NAPROSYN) 500 MG tablet, Take 1 tablet (500 mg total) by mouth 2 (two) times daily with a meal., Disp: 30 tablet, Rfl: 0 .  Potassium 75 MG TABS, Take by mouth., Disp: , Rfl:  .  SUPER B COMPLEX/C PO, Take by mouth., Disp: , Rfl:  .  zinc gluconate 50 MG tablet, Take 50 mg by mouth daily., Disp: , Rfl:   No Known Allergies  History reviewed: allergies, current medications, past family history, past medical history, past social history, past surgical history and problem list  Chronic issues discussed: Hypertension-compliant with medication  Chronic shoulder pain- needs to have surgery on his right shoulder, but cannot due to work requirements right now.  He is considering doing this in the winter.  He requests something to help  with pain at this time.  Sees Weston Anna for this in general  Varicose veins-active with exercise  Acute issues discussed: Lately been having some ocean sounds in ear, equilibrium seems off, hasn't felt good lately.  Wonders if related to hot temperatures of late.  No numbness, no tingling, no confusion.   No slurred speech.    Been feeling this way  a few weeks.   No falls.  No CP, no SOB, no DOE.   He does note increased water intake.  In general does drink 3 or 4 alcoholic beverages daily ongoing.  This is not new.  Denies drug use.    No concern for memory, but hasn't ever been able to remember much even from childhood days.   Objective:      Biometrics BP 130/80   Pulse (!) 58   Temp 98.2 F (36.8 C) (Oral)   Resp 16   Ht 5\' 8"  (1.727 m)   Wt 182 lb (82.6 kg)   SpO2 98%   BMI 27.67 kg/m    Cognitive Testing  Alert? Yes  Normal Appearance?Yes  Oriented to person? Yes  Place? Yes   Time? Yes  Recall of three objects?  Yes  Can perform simple calculations? Yes  Displays appropriate judgment?Yes  Can read the correct time from a watch face?Yes  General appearance: alert, no distress, WD/WN, white male   Nutritional Status: Inadequate calore intake? no Loss of muscle mass? no Loss of fat beneath skin? no Localized or general edema? no Diminished functional status? no  Other pertinent exam: HEENT: normocephalic, sclerae anicteric, TMs pearly, nares patent, no discharge or erythema, pharynx normal Skin: left upper back over scapula region with large fatty lump suggestive of lipoma, and left lower back with small 3cm broad fatty lump also suggestive of lipoma, few scattered macules, 3 independent 5-53mm diameter stuck on appearing raised brownish lesions of mid upper back suggestive of seborrheic keratoses, bilat dorsal hands with few scattered crusted 2-45mm diameter lesions, thickened toenails Oral cavity: MMM, no lesions, several teeth missing Neck: supple, no  lymphadenopathy, no thyromegaly, no masses, no bruits Heart: RRR, normal S1, S2, no murmurs Lungs: CTA bilaterally, no wheezes, rhonchi, or rales Abdomen: +bs, soft, non tender, non distended, no masses, no hepatomegaly, no splenomegaly Musculoskeletal: right shoulder nontneder, pain with ROM, nontender, no swelling, no obvious deformity Extremities: moderate/severe varicosities , much worse left lower leg, otherwise no edema, no cyanosis, no clubbing Pulses: 2+ symmetric, upper and lower extremities, normal cap refill Neurological: alert, oriented x 3, CN2-12 intact, strength normal upper extremities and lower extremities, sensation normal throughout, DTRs 2+ throughout, no cerebellar signs, gait normal Psychiatric: normal affect, behavior normal, pleasant  GU: normal male external genitalia, circumcised, nontender, no masses, no hernia, no lymphadenopathy Rectal: deferred    Assessment:   Encounter Diagnoses  Name Primary?  . Encounter for health maintenance examination in adult Yes  . Essential hypertension   . Varicose veins of left lower extremity, unspecified whether complicated   . Hearing difficulty of both ears   . Vaccine counseling   . Screen for colon cancer   . Lipoma of torso   . Chronic right shoulder pain   . Screening for diabetes mellitus   . Need for pneumococcal vaccination   . Memory change   . Dizziness   . Ataxia   . Skin lesion   . Medicare annual wellness visit, subsequent   . Need for hepatitis C screening test      Plan:   A preventative services visit was completed today.  During the course of the visit today, we discussed and counseled about appropriate screening and preventive services.  A health risk assessment was established today that included a review of current medications, allergies, social history, family history, medical and preventative health history, biometrics, and preventative screenings to identify potential safety concerns or  impairments.  A personalized plan was printed today for your records and use.   Personalized health advice and education was given today to reduce health risks and promote self management and wellness.  Information regarding end of life planning was discussed today.  Recommendations:  I recommend you go ahead and call the gastroenterologist to schedule your endoscopy and colonoscopy as you are due  I would like to refer you to dermatology for skin surveillance or if you want to call and make the appointment at [facility] dermatology you can   please establish with an eye doctor for routine eye care  We will call you with lab results  I prescribed meloxicam to use once daily for arthritis pain and shoulder pain  We updated your pneumococcal 23 vaccine today  We recommend a yearly flu shot in the fall  If your labs are normal we may want to consider a head scan given your recent symptoms including blurred vision, dizziness, disequilibrium, and not feeling well  I recommend you drink no more than 1 alcoholic beverage on a given day   Conditions/risks identified: Recent dizziness and blurred vision - pending labs, consider head imaging   Referrals today: Advised colonoscopy and dermatology consults He has the contact info and wants to call and schedule.  He canceled his last colonoscopy consult appt within past year   Immunizations: I recommended a yearly influenza vaccine, typically in September when the vaccine is usually available  Counseled on the pneumococcal vaccine.  Vaccine information sheet given.  Pneumococcal vaccine PPSV23 given after consent obtained.   Medicare Attestation A preventative services visit was completed today.  During the course of the visit the patient was educated and counseled about appropriate screening and preventive services.  A health risk assessment was established with the patient that included a review of current medications, allergies, social  history, family history, medical and preventative health history, biometrics, and preventative screenings to identify potential safety concerns or impairments.  A personalized plan was printed today for the patient's records and use.   Personalized health advice and education was given today to reduce health risks and promote self management and wellness.  Information regarding end of life planning was discussed today.  Dorothea Ogle, PA-C   10/05/2017

## 2017-10-06 ENCOUNTER — Other Ambulatory Visit: Payer: Self-pay | Admitting: Medical

## 2017-10-06 DIAGNOSIS — H814 Vertigo of central origin: Secondary | ICD-10-CM

## 2017-10-06 DIAGNOSIS — G3281 Cerebellar ataxia in diseases classified elsewhere: Secondary | ICD-10-CM

## 2017-10-06 LAB — COMPREHENSIVE METABOLIC PANEL
A/G RATIO: 1.7 (ref 1.2–2.2)
ALK PHOS: 60 IU/L (ref 39–117)
ALT: 39 IU/L (ref 0–44)
AST: 28 IU/L (ref 0–40)
Albumin: 4.7 g/dL (ref 3.6–4.8)
BUN / CREAT RATIO: 17 (ref 10–24)
BUN: 21 mg/dL (ref 8–27)
Bilirubin Total: 0.8 mg/dL (ref 0.0–1.2)
CO2: 23 mmol/L (ref 20–29)
Calcium: 9.5 mg/dL (ref 8.6–10.2)
Chloride: 97 mmol/L (ref 96–106)
Creatinine, Ser: 1.25 mg/dL (ref 0.76–1.27)
GFR calc Af Amer: 69 mL/min/{1.73_m2} (ref >59)
GFR calc non Af Amer: 60 mL/min/{1.73_m2} (ref >59)
GLOBULIN, TOTAL: 2.8 g/dL (ref 1.5–4.5)
Glucose: 98 mg/dL (ref 65–99)
POTASSIUM: 4.7 mmol/L (ref 3.5–5.2)
Sodium: 138 mmol/L (ref 134–144)
Total Protein: 7.5 g/dL (ref 6.0–8.5)

## 2017-10-06 LAB — CBC
Hematocrit: 45.3 % (ref 37.5–51.0)
Hemoglobin: 15.6 g/dL (ref 13.0–17.7)
MCH: 32.6 pg (ref 26.6–33.0)
MCHC: 34.4 g/dL (ref 31.5–35.7)
MCV: 95 fL (ref 79–97)
Platelets: 176 10*3/uL (ref 150–450)
RBC: 4.78 x10E6/uL (ref 4.14–5.80)
RDW: 14.1 % (ref 12.3–15.4)
WBC: 7.2 10*3/uL (ref 3.4–10.8)

## 2017-10-06 LAB — HEPATITIS C ANTIBODY: Hep C Virus Ab: <0.1 s/co ratio (ref 0.0–0.9)

## 2017-10-06 LAB — RPR: RPR Ser Ql: NONREACTIVE

## 2017-10-06 LAB — TSH: TSH: 1.44 u[IU]/mL (ref 0.450–4.500)

## 2017-10-06 LAB — HIV ANTIBODY (ROUTINE TESTING W REFLEX): HIV Screen 4th Generation wRfx: NONREACTIVE

## 2017-10-06 LAB — VITAMIN D 25 HYDROXY (VIT D DEFICIENCY, FRACTURES): Vit D, 25-Hydroxy: 37 ng/mL (ref 30.0–100.0)

## 2017-10-06 LAB — HEMOGLOBIN A1C
Est. average glucose Bld gHb Est-mCnc: 91 mg/dL
HEMOGLOBIN A1C: 4.8 % (ref 4.8–5.6)

## 2017-10-06 LAB — VITAMIN B12: Vitamin B-12: 412 pg/mL (ref 232–1245)

## 2017-10-06 MED ORDER — METOPROLOL TARTRATE 100 MG PO TABS: 100.0000 mg | ORAL_TABLET | Freq: Two times a day (BID) | ORAL | 3 refills | Status: DC

## 2017-10-06 MED ORDER — LISINOPRIL 20 MG PO TABS: 20.0000 mg | ORAL_TABLET | Freq: Every day | ORAL | 3 refills | Status: DC

## 2017-10-06 MED ORDER — PRAVASTATIN SODIUM 20 MG PO TABS: 20.0000 mg | ORAL_TABLET | Freq: Every evening | ORAL | 0 refills | Status: DC

## 2017-10-11 DIAGNOSIS — H25043 Posterior subcapsular polar age-related cataract, bilateral: Secondary | ICD-10-CM | POA: Diagnosis not present

## 2017-11-16 ENCOUNTER — Ambulatory Visit (INDEPENDENT_AMBULATORY_CARE_PROVIDER_SITE_OTHER): Payer: Medicare HMO | Admitting: Medical

## 2017-11-16 VITALS — BP 130/80 | HR 54 | Temp 98.4°F | Resp 16 | Ht 68.0 in | Wt 182.0 lb

## 2017-11-16 DIAGNOSIS — G8929 Other chronic pain: Secondary | ICD-10-CM | POA: Diagnosis not present

## 2017-11-16 DIAGNOSIS — M7052 Other bursitis of knee, left knee: Secondary | ICD-10-CM | POA: Diagnosis not present

## 2017-11-16 DIAGNOSIS — M25511 Pain in right shoulder: Secondary | ICD-10-CM

## 2017-11-16 DIAGNOSIS — H269 Unspecified cataract: Secondary | ICD-10-CM | POA: Diagnosis not present

## 2017-11-16 DIAGNOSIS — M25562 Pain in left knee: Secondary | ICD-10-CM

## 2017-11-16 MED ORDER — HYDROCODONE-ACETAMINOPHEN 5-325 MG PO TABS: 1.0000 | ORAL_TABLET | Freq: Four times a day (QID) | ORAL | 0 refills | Status: DC | PRN

## 2017-11-16 MED ORDER — LIDOCAINE HCL (PF) 1 % IJ SOLN
1.0000 mg | Freq: Once | INTRAMUSCULAR | Status: AC
  Administered 2017-11-16: 1 mg via INTRADERMAL

## 2017-11-16 MED ORDER — TRIAMCINOLONE ACETONIDE 40 MG/ML IJ SUSP
40.0000 mg | Freq: Once | INTRAMUSCULAR | Status: AC
  Administered 2017-11-16: 10 mg via INTRAMUSCULAR

## 2017-11-16 MED ORDER — MELOXICAM 15 MG PO TABS: 15.0000 mg | ORAL_TABLET | Freq: Every day | ORAL | 2 refills | Status: DC

## 2017-11-16 NOTE — Addendum Note (Signed)
Addended by: Gwinda Maine on: 11/16/2017 10:00 AM   Modules accepted: Orders

## 2017-11-16 NOTE — Progress Notes (Signed)
Subjective: Chief Complaint  Patient presents with  . knee pain    left knee painful X 1 week    Here for c/o left knee pain.  Denies specific injury or trauma, but has been bothering him a week, can't weight bear much on left.   Feels worse in knee cap.   Usually pushing spreader or riding tractor.   Works in Teacher, adult education care, so active.   Feels swollen at times.   No paresthesias.   Was hot few days ago.   Using some ice.  No prior knee surgery.   Played hockey for 35 years.   No other aggravating or relieving factors. No other complaint.  Past Medical History:  Diagnosis Date  . Chronic pain in shoulder 2018   [place]  . H/O echocardiogram 10/08/2016   EF 65-70%, normal wall motion, systolic function vigorous  . Hearing loss    pending hearing aids 06/2014  . Hypertension   . Lipoma   . Varicose vein    Current Outpatient Medications on File Prior to Visit  Medication Sig Dispense Refill  . Calcium Carbonate-Vitamin D (CALCIUM 500 + D) 500-125 MG-UNIT TABS Take by mouth.    . fish oil-omega-3 fatty acids 1000 MG capsule Take 1 g by mouth daily.    Marland Kitchen lisinopril (PRINIVIL,ZESTRIL) 20 MG tablet Take 1 tablet (20 mg total) by mouth daily. 90 tablet 3  . magnesium oxide (MAG-OX) 400 MG tablet Take 400 mg by mouth 2 (two) times daily.    . metoprolol tartrate (LOPRESSOR) 100 MG tablet Take 1 tablet (100 mg total) by mouth 2 (two) times daily. 180 tablet 3  . milk thistle 175 MG tablet Take 175 mg by mouth daily.    . Multiple Vitamin (MULITIVITAMIN WITH MINERALS) TABS Take 1 tablet by mouth daily.    . Potassium 75 MG TABS Take by mouth.    . pravastatin (PRAVACHOL) 20 MG tablet Take 1 tablet (20 mg total) by mouth every evening. 90 tablet 0  . SUPER B COMPLEX/C PO Take by mouth.    . zinc gluconate 50 MG tablet Take 50 mg by mouth daily.     No current facility-administered medications on file prior to visit.    Past Surgical History:  Procedure Laterality Date  .  APPENDECTOMY    . COLONOSCOPY     age 37, he did not f/u with referral 2018  . LUMBAR DISC SURGERY  2002   Dr. Rita Ohara  . WISDOM TOOTH EXTRACTION      ROS as in subjective   Objective: BP 130/80   Pulse (!) 54   Temp 98.4 F (36.9 C) (Oral)   Resp 16   Ht 5\' 8"  (1.727 m)   Wt 182 lb (82.6 kg)   SpO2 98%   BMI 27.67 kg/m   Gen: wd, wn, nad Skin: unremarkable, no obvious warmth or erythema Quite tender over inferior portion of left patella/superior infrapatellar tendon area, focal in this area, but nontneder otherwise, no obvious swelling, on other deformity, no joint laxity, special tests normal.  He does have pain with flexion and extension which is about 90% of normal severe varicose veins left leg, otherwise pulses 2+, sensation and strength intact Rest of leg exam unremarkable, nontender and normal ROM    Assessment: Encounter Diagnoses  Name Primary?  . Infrapatellar bursitis of left knee Yes  . Patellar pain, left   . Cataract of both eyes, unspecified cataract type   . Chronic  right shoulder pain      Plan: Discussed symptoms, exam findings.   Procedure-steroid injection for left infrapatellar bursitis: Discussed symptoms, concerns, treatment options, risks and benefits of steroid injection.   He gives consent.  Cleaned and prepped the infrapatellar area in normal sterile fashion, used ethyl acetate spray for local anesthesia, injected 0.75 mL of 1% lidocaine without epi + 10mg  (0.18mL) of Kenalog 40mg /mL using a 21 gauge needle, 61mL total solution.   With knee supported and extended, used left hand to web of cephalix index and thumb on superior pole of patella tilt inferior pole upward, inserted needled at 45 degree angle into distal end of patella/origin of tendon area, peppering the area in 2 rows.   Covered area with sterile bandage, and patient tolerated procedure well.     After care - discussed rest x 7-10 days, ice with bag of frozen peas for cold  therapy, leg elevation on pillow.  Can use Meloxicam for this and his shoulder issues chronic, and short term prescription for Norco given for break through pain.  After a week, start using some ROM and stretching as discussed.  Recheck in with call back in 4-5 days.  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   Trason was seen today for knee pain.  Diagnoses and all orders for this visit:  Infrapatellar bursitis of left knee  Patellar pain, left  Cataract of both eyes, unspecified cataract type  Chronic right shoulder pain  Other orders -     HYDROcodone-acetaminophen (NORCO) 5-325 MG tablet; Take 1 tablet by mouth every 6 (six) hours as needed for moderate pain. -     meloxicam (MOBIC) 15 MG tablet; Take 1 tablet (15 mg total) by mouth daily.

## 2017-11-23 ENCOUNTER — Telehealth: Payer: Self-pay | Admitting: Medical

## 2017-11-23 NOTE — Telephone Encounter (Signed)
Patient states that knee is 80% better.  Swelling is gone and he wanted to see if he can get a refill on hydrocodone?

## 2017-11-23 NOTE — Telephone Encounter (Signed)
Call and see how his knee is doing since last visit, injection?

## 2017-11-23 NOTE — Telephone Encounter (Signed)
Left message on voicemail for patient to call back. 

## 2017-11-24 ENCOUNTER — Other Ambulatory Visit: Payer: Self-pay | Admitting: Medical

## 2017-11-24 MED ORDER — HYDROCODONE-ACETAMINOPHEN 5-325 MG PO TABS: 1.0000 | ORAL_TABLET | Freq: Four times a day (QID) | ORAL | 0 refills | Status: DC | PRN

## 2017-11-28 DIAGNOSIS — R69 Illness, unspecified: Secondary | ICD-10-CM | POA: Diagnosis not present

## 2018-02-21 ENCOUNTER — Ambulatory Visit: Payer: Medicare HMO | Admitting: Medical

## 2018-02-22 ENCOUNTER — Encounter: Payer: Self-pay | Admitting: Medical

## 2018-02-22 ENCOUNTER — Telehealth: Payer: Self-pay | Admitting: Medical

## 2018-02-22 ENCOUNTER — Ambulatory Visit (INDEPENDENT_AMBULATORY_CARE_PROVIDER_SITE_OTHER): Payer: Medicare HMO | Admitting: Medical

## 2018-02-22 VITALS — BP 120/78 | HR 57 | Temp 98.6°F | Wt 175.2 lb

## 2018-02-22 DIAGNOSIS — M1990 Unspecified osteoarthritis, unspecified site: Secondary | ICD-10-CM | POA: Diagnosis not present

## 2018-02-22 DIAGNOSIS — K219 Gastro-esophageal reflux disease without esophagitis: Secondary | ICD-10-CM | POA: Insufficient documentation

## 2018-02-22 DIAGNOSIS — Z789 Other specified health status: Secondary | ICD-10-CM | POA: Diagnosis not present

## 2018-02-22 DIAGNOSIS — G8929 Other chronic pain: Secondary | ICD-10-CM

## 2018-02-22 DIAGNOSIS — M25511 Pain in right shoulder: Secondary | ICD-10-CM

## 2018-02-22 MED ORDER — HYDROCODONE-ACETAMINOPHEN 10-325 MG PO TABS: 1.0000 | ORAL_TABLET | Freq: Four times a day (QID) | ORAL | 0 refills | Status: DC | PRN

## 2018-02-22 MED ORDER — OMEPRAZOLE 40 MG PO CPDR: 40.0000 mg | DELAYED_RELEASE_CAPSULE | Freq: Every day | ORAL | 2 refills | Status: DC

## 2018-02-22 NOTE — Patient Instructions (Signed)
Gastroesophageal Reflux Disease, Adult   Gastroesophageal reflux disease (GERD) happens when acid from your stomach flows up into the esophagus. When acid comes in contact with the esophagus, the acid causes soreness (inflammation) in the esophagus. Over time, GERD may create small holes (ulcers) in the lining of the esophagus.  CAUSES   Increased body weight. This puts pressure on the stomach, making acid rise from the stomach into the esophagus.   Smoking. This increases acid production in the stomach.   Drinking alcohol. This causes decreased pressure in the lower esophageal sphincter (valve or ring of muscle between the esophagus and stomach), allowing acid from the stomach into the esophagus.   Late evening meals and a full stomach. This increases pressure and acid production in the stomach.   A malformed lower esophageal sphincter.  Sometimes, no cause is found.  SYMPTOMS   Burning pain in the lower part of the mid-chest behind the breastbone and in the mid-stomach area. This may occur twice a week or more often.   Trouble swallowing.   Sore throat.   Dry cough.   Asthma-like symptoms including chest tightness, shortness of breath, or wheezing.   DIAGNOSIS  Your caregiver may be able to diagnose GERD based on your symptoms. In some cases, X-rays and other tests may be done to check for complications or to check the condition of your stomach and esophagus.  TREATMENT  You may use Omeprazole daily for the next month   [place] the factors that you can control. Ask your caregiver for guidance concerning weight loss, quitting smoking, and alcohol consumption.   Avoid foods and drinks that make your symptoms worse, such as:   Caffeine or alcoholic drinks.   Chocolate.   Peppermint or mint flavorings.   Garlic and onions.   Spicy foods.   Citrus fruits, such as oranges, lemons, or limes.   Tomato-based foods such as sauce, chili, salsa, and  pizza.   Fried and fatty foods.   Avoid lying down for the 3 hours prior to your bedtime or prior to taking a nap.   Eat small, frequent meals instead of large meals.   Wear loose-fitting clothing. Do not wear anything tight around your waist that causes pressure on your stomach.   Raise the head of your bed 6 to 8 inches with wood blocks to help you sleep. Extra pillows will not help.   Only take over-the-counter or prescription medicines for pain, discomfort, or fever as directed by your caregiver.   Do not take aspirin, ibuprofen, or other nonsteroidal anti-inflammatory drugs (NSAIDs).   SEEK IMMEDIATE MEDICAL CARE IF:   You have pain in your arms, neck, jaw, teeth, or back.   Your pain increases or changes in intensity or duration.   You develop nausea, vomiting, or sweating (diaphoresis).   You develop shortness of breath, or you faint.   Your vomit is green, yellow, black, or looks like coffee grounds or blood.   Your stool is red, bloody, or black.  These symptoms could be signs of other problems, such as heart disease, gastric bleeding, or esophageal bleeding. MAKE SURE YOU:   Understand these instructions.   Will watch your condition.   Will get help right away if you are not doing well or get worse.  Document Released: 12/09/2004 Document Revised: 11/11/2010 Document Reviewed: 09/18/2010 [facility] Patient Information 2012 [place].

## 2018-02-22 NOTE — Telephone Encounter (Signed)
Please pull an [place] controlled substance report on him

## 2018-02-22 NOTE — Progress Notes (Signed)
Subjective: Chief Complaint  Patient presents with  . other    Acid reflux past four or five days, tums not  working   Here for worse acid reflux of late.   After eating getting recurring discomfort in throat he attributes to reflux.   Takes Tums and this helps.   No excessive burping.  sometimes gets acidic taste in mouth.   Has some heartburn .  Denies a specific chest pain or SOB.    Sometimes eats within an hour of bedtime.  If he eats peppers it really sets him off.  He uses meloxicam every now and then, not daily.  He has been known to drink heavy amount of alcohol and drinks regularly.  He did some shots with the guys recently.  His daughters have complained to him that he drinks and ask like a 67 year old.  He currently is using Tums but no other treatment.  He is scheduled to have his colonoscopy soon.  He is requesting to do an upper endoscopy as well.  He would like a refill on his pain medication he uses periodically.  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  Past Medical History:  Diagnosis Date  . Chronic pain in shoulder 2018   [place]  . H/O echocardiogram 10/08/2016   EF 65-70%, normal wall motion, systolic function vigorous  . Hearing loss    pending hearing aids 06/2014  . Hypertension   . Lipoma   . Varicose vein    Current Outpatient Medications on File Prior to Visit  Medication Sig Dispense Refill  . lisinopril (PRINIVIL,ZESTRIL) 20 MG tablet Take 1 tablet (20 mg total) by mouth daily. 90 tablet 3  . magnesium oxide (MAG-OX) 400 MG tablet Take 400 mg by mouth 2 (two) times daily.    . meloxicam (MOBIC) 15 MG tablet Take 1 tablet (15 mg total) by mouth daily. 30 tablet 2  . metoprolol tartrate (LOPRESSOR) 100 MG tablet Take 1 tablet (100 mg total) by mouth 2 (two) times daily. 180 tablet 3  . Multiple Vitamin (MULITIVITAMIN WITH MINERALS) TABS Take 1 tablet by mouth daily.    .  Potassium 75 MG TABS Take by mouth.    . pravastatin (PRAVACHOL) 20 MG tablet Take 1 tablet (20 mg total) by mouth every evening. 90 tablet 0  . SUPER B COMPLEX/C PO Take by mouth.    . zinc gluconate 50 MG tablet Take 50 mg by mouth daily.    . Calcium Carbonate-Vitamin D (CALCIUM 500 + D) 500-125 MG-UNIT TABS Take by mouth.    . fish oil-omega-3 fatty acids 1000 MG capsule Take 1 g by mouth daily.    . milk thistle 175 MG tablet Take 175 mg by mouth daily.     No current facility-administered medications on file prior to visit.    ROS as in subjective   Objective: BP 120/78 (BP Location: Left Arm, Patient Position: Sitting)   Pulse (!) 57   Temp 98.6 F (37 C)   Wt 175 lb 3.2 oz (79.5 kg)   SpO2 99%   BMI 26.64 kg/m   Wt Readings from Last 3 Encounters:  02/22/18 175 lb 3.2 oz (79.5 kg)  11/16/17 182 lb (82.6 kg)  10/05/17 182 lb (82.6 kg)   General appearance: alert, no distress, WD/WN,  Oral cavity: MMM, no lesions Neck: supple, no lymphadenopathy, no thyromegaly, no masses Heart: RRR, normal S1, S2, no  murmurs Lungs: CTA bilaterally, no wheezes, rhonchi, or rales Abdomen: +bs, soft, non tender, non distended, no masses, no hepatomegaly, no splenomegaly Pulses: 2+ symmetric, upper and lower extremities, normal cap refill     Assessment: Encounter Diagnoses  Name Primary?  . Gastroesophageal reflux disease, esophagitis presence not specified Yes  . Alcohol consumption heavy   . Chronic right shoulder pain   . Arthritis      Plan: Begin Omeprazole, 1 tablet 30-45 minutes before breakfast every morning for acid reflux.   Avoid acid reflux triggers such as acidic foods, citrus, tomatoes-based foods, caffeine, alcohol, big portions, fried foods, greasy foods, and avoid eating or drinking within 2 hours of bedtime.    F/u with GI for endoscopy and colonoscopy soon as planned  Refill medication below for pain short term.   Also limit NSAID given GERD  Kasen was  seen today for other.  Diagnoses and all orders for this visit:  Gastroesophageal reflux disease, esophagitis presence not specified  Alcohol consumption heavy  Chronic right shoulder pain  Arthritis  Other orders -     omeprazole (PRILOSEC) 40 MG capsule; Take 1 capsule (40 mg total) by mouth daily. -     HYDROcodone-acetaminophen (NORCO) 10-325 MG tablet; Take 1 tablet by mouth every 6 (six) hours as needed.   F/u 3-4 weeks

## 2018-03-13 ENCOUNTER — Other Ambulatory Visit: Payer: Self-pay | Admitting: Medical

## 2018-03-13 ENCOUNTER — Telehealth: Payer: Self-pay | Admitting: Medical

## 2018-03-13 MED ORDER — HYDROCODONE-ACETAMINOPHEN 10-325 MG PO TABS: 1.0000 | ORAL_TABLET | Freq: Four times a day (QID) | ORAL | 0 refills | Status: DC | PRN

## 2018-03-13 NOTE — Telephone Encounter (Signed)
Pt called for refills of norco. This is for his shoulder. Please send to [address]. Pt can be reached at [phone].

## 2018-03-28 ENCOUNTER — Telehealth: Payer: Self-pay | Admitting: Medical

## 2018-03-28 NOTE — Telephone Encounter (Signed)
When is his ortho follow, as I was understanding he was seeing ortho soon in winter downtime.

## 2018-03-28 NOTE — Telephone Encounter (Signed)
Pt called and needs  A refill on his Norco pt would like it sent to the [facility], [place]

## 2018-03-29 ENCOUNTER — Other Ambulatory Visit: Payer: Self-pay | Admitting: Medical

## 2018-03-29 MED ORDER — HYDROCODONE-ACETAMINOPHEN 10-325 MG PO TABS: 1.0000 | ORAL_TABLET | Freq: Four times a day (QID) | ORAL | 0 refills | Status: DC | PRN

## 2018-03-29 NOTE — Telephone Encounter (Signed)
Pt states he is having Surgery Jan the 27th

## 2018-03-29 NOTE — Telephone Encounter (Signed)
Med sent.

## 2018-03-29 NOTE — Telephone Encounter (Signed)
Patient states that he is waiting on Sherry at Ortho to call him back to schedule his surgery.   He states that he only needs 1 more refill and then he will have them take over.

## 2018-04-09 ENCOUNTER — Encounter (HOSPITAL_COMMUNITY): Payer: Self-pay

## 2018-04-09 ENCOUNTER — Other Ambulatory Visit: Payer: Self-pay

## 2018-04-09 ENCOUNTER — Inpatient Hospital Stay (HOSPITAL_COMMUNITY)
Admission: EM | Admit: 2018-04-09 | Discharge: 2018-04-14 | DRG: 394 | Disposition: A | Discharge status: Home / Self Care | Payer: Medicare HMO | Attending: Internal Medicine | Admitting: Internal Medicine

## 2018-04-09 ENCOUNTER — Emergency Department (HOSPITAL_COMMUNITY): Payer: Medicare HMO

## 2018-04-09 DIAGNOSIS — R58 Hemorrhage, not elsewhere classified: Secondary | ICD-10-CM | POA: Diagnosis not present

## 2018-04-09 DIAGNOSIS — Z789 Other specified health status: Secondary | ICD-10-CM | POA: Diagnosis not present

## 2018-04-09 DIAGNOSIS — K55039 Acute (reversible) ischemia of large intestine, extent unspecified: Secondary | ICD-10-CM | POA: Diagnosis present

## 2018-04-09 DIAGNOSIS — K633 Ulcer of intestine: Secondary | ICD-10-CM | POA: Diagnosis present

## 2018-04-09 DIAGNOSIS — K921 Melena: Secondary | ICD-10-CM | POA: Diagnosis not present

## 2018-04-09 DIAGNOSIS — Z79899 Other long term (current) drug therapy: Secondary | ICD-10-CM | POA: Diagnosis not present

## 2018-04-09 DIAGNOSIS — K219 Gastro-esophageal reflux disease without esophagitis: Secondary | ICD-10-CM | POA: Diagnosis present

## 2018-04-09 DIAGNOSIS — K5730 Diverticulosis of large intestine without perforation or abscess without bleeding: Secondary | ICD-10-CM | POA: Diagnosis not present

## 2018-04-09 DIAGNOSIS — I10 Essential (primary) hypertension: Secondary | ICD-10-CM | POA: Diagnosis present

## 2018-04-09 DIAGNOSIS — Z8249 Family history of ischemic heart disease and other diseases of the circulatory system: Secondary | ICD-10-CM

## 2018-04-09 DIAGNOSIS — R1084 Generalized abdominal pain: Secondary | ICD-10-CM | POA: Diagnosis not present

## 2018-04-09 DIAGNOSIS — R109 Unspecified abdominal pain: Secondary | ICD-10-CM | POA: Diagnosis not present

## 2018-04-09 DIAGNOSIS — K529 Noninfective gastroenteritis and colitis, unspecified: Secondary | ICD-10-CM

## 2018-04-09 DIAGNOSIS — D339 Benign neoplasm of central nervous system, unspecified: Secondary | ICD-10-CM | POA: Diagnosis not present

## 2018-04-09 DIAGNOSIS — D333 Benign neoplasm of cranial nerves: Secondary | ICD-10-CM | POA: Diagnosis present

## 2018-04-09 DIAGNOSIS — G939 Disorder of brain, unspecified: Secondary | ICD-10-CM | POA: Diagnosis not present

## 2018-04-09 DIAGNOSIS — Z1211 Encounter for screening for malignant neoplasm of colon: Secondary | ICD-10-CM | POA: Diagnosis not present

## 2018-04-09 DIAGNOSIS — K559 Vascular disorder of intestine, unspecified: Secondary | ICD-10-CM | POA: Diagnosis not present

## 2018-04-09 DIAGNOSIS — K5731 Diverticulosis of large intestine without perforation or abscess with bleeding: Secondary | ICD-10-CM | POA: Diagnosis not present

## 2018-04-09 DIAGNOSIS — R195 Other fecal abnormalities: Secondary | ICD-10-CM | POA: Diagnosis not present

## 2018-04-09 DIAGNOSIS — K625 Hemorrhage of anus and rectum: Secondary | ICD-10-CM | POA: Diagnosis not present

## 2018-04-09 DIAGNOSIS — H9191 Unspecified hearing loss, right ear: Secondary | ICD-10-CM | POA: Diagnosis present

## 2018-04-09 DIAGNOSIS — R69 Illness, unspecified: Secondary | ICD-10-CM | POA: Diagnosis not present

## 2018-04-09 DIAGNOSIS — Z833 Family history of diabetes mellitus: Secondary | ICD-10-CM | POA: Diagnosis not present

## 2018-04-09 DIAGNOSIS — R933 Abnormal findings on diagnostic imaging of other parts of digestive tract: Secondary | ICD-10-CM | POA: Diagnosis not present

## 2018-04-09 DIAGNOSIS — F1010 Alcohol abuse, uncomplicated: Secondary | ICD-10-CM | POA: Diagnosis present

## 2018-04-09 DIAGNOSIS — R42 Dizziness and giddiness: Secondary | ICD-10-CM | POA: Diagnosis not present

## 2018-04-09 DIAGNOSIS — G9389 Other specified disorders of brain: Secondary | ICD-10-CM | POA: Diagnosis not present

## 2018-04-09 DIAGNOSIS — R52 Pain, unspecified: Secondary | ICD-10-CM | POA: Diagnosis not present

## 2018-04-09 LAB — COMPREHENSIVE METABOLIC PANEL
ALT: 30 U/L (ref 0–44)
AST: 36 U/L (ref 15–41)
Albumin: 4.2 g/dL (ref 3.5–5.0)
Alkaline Phosphatase: 44 U/L (ref 38–126)
Anion gap: 15 (ref 5–15)
BUN: 19 mg/dL (ref 8–23)
CO2: 22 mmol/L (ref 22–32)
Calcium: 9.2 mg/dL (ref 8.9–10.3)
Chloride: 98 mmol/L (ref 98–111)
Creatinine, Ser: 1.2 mg/dL (ref 0.61–1.24)
GFR calc Af Amer: >60 mL/min (ref >60)
GFR calc non Af Amer: >60 mL/min (ref >60)
Glucose, Bld: 133 mg/dL — ABNORMAL HIGH (ref 70–99)
Potassium: 4.5 mmol/L (ref 3.5–5.1)
Sodium: 135 mmol/L (ref 135–145)
Total Bilirubin: 1.3 mg/dL — ABNORMAL HIGH (ref 0.3–1.2)
Total Protein: 7.4 g/dL (ref 6.5–8.1)

## 2018-04-09 LAB — CBC
HEMATOCRIT: 44.7 % (ref 39.0–52.0)
Hemoglobin: 15.7 g/dL (ref 13.0–17.0)
MCH: 31.4 pg (ref 26.0–34.0)
MCHC: 35.1 g/dL (ref 30.0–36.0)
MCV: 89.4 fL (ref 80.0–100.0)
Platelets: 153 10*3/uL (ref 150–400)
RBC: 5 MIL/uL (ref 4.22–5.81)
RDW: 13.3 % (ref 11.5–15.5)
WBC: 12.1 10*3/uL — ABNORMAL HIGH (ref 4.0–10.5)
nRBC: 0 % (ref 0.0–0.2)

## 2018-04-09 LAB — ABO/RH: ABO/RH(D): O POS

## 2018-04-09 LAB — TYPE AND SCREEN
ABO/RH(D): O POS
Antibody Screen: NEGATIVE

## 2018-04-09 MED ORDER — SODIUM CHLORIDE 0.9% FLUSH: 3.0000 mL | INTRAVENOUS | Status: DC | PRN

## 2018-04-09 MED ORDER — METOPROLOL TARTRATE 100 MG PO TABS
100.0000 mg | ORAL_TABLET | Freq: Two times a day (BID) | ORAL | Status: DC
  Administered 2018-04-09 – 2018-04-14 (×9): 100 mg via ORAL
  Filled 2018-04-09 (×2): qty 4
  Filled 2018-04-09 (×4): qty 1
  Filled 2018-04-09 (×3): qty 4
  Filled 2018-04-09: qty 1

## 2018-04-09 MED ORDER — ADULT MULTIVITAMIN W/MINERALS CH
1.0000 | ORAL_TABLET | Freq: Every day | ORAL | Status: DC
  Administered 2018-04-09 – 2018-04-14 (×6): 1 via ORAL
  Filled 2018-04-09 (×6): qty 1

## 2018-04-09 MED ORDER — SODIUM CHLORIDE 0.9% FLUSH
3.0000 mL | Freq: Two times a day (BID) | INTRAVENOUS | Status: DC
  Administered 2018-04-12: 3 mL via INTRAVENOUS
  Administered 2018-04-13: 10 mL via INTRAVENOUS

## 2018-04-09 MED ORDER — METRONIDAZOLE IN NACL 5-0.79 MG/ML-% IV SOLN
500.0000 mg | Freq: Three times a day (TID) | INTRAVENOUS | Status: DC
  Administered 2018-04-09 – 2018-04-13 (×12): 500 mg via INTRAVENOUS
  Filled 2018-04-09 (×12): qty 100

## 2018-04-09 MED ORDER — SODIUM CHLORIDE 0.9% FLUSH
3.0000 mL | Freq: Two times a day (BID) | INTRAVENOUS | Status: DC
  Administered 2018-04-12: 3 mL via INTRAVENOUS

## 2018-04-09 MED ORDER — ACETAMINOPHEN 325 MG PO TABS
650.0000 mg | ORAL_TABLET | Freq: Four times a day (QID) | ORAL | Status: DC | PRN
  Administered 2018-04-09: 650 mg via ORAL
  Filled 2018-04-09: qty 2

## 2018-04-09 MED ORDER — IOHEXOL 300 MG/ML  SOLN
100.0000 mL | Freq: Once | INTRAMUSCULAR | Status: AC | PRN
  Administered 2018-04-09: 100 mL via INTRAVENOUS

## 2018-04-09 MED ORDER — MORPHINE SULFATE (PF) 4 MG/ML IV SOLN
4.0000 mg | Freq: Once | INTRAVENOUS | Status: AC
  Administered 2018-04-09: 4 mg via INTRAVENOUS
  Filled 2018-04-09: qty 1

## 2018-04-09 MED ORDER — LORAZEPAM 2 MG/ML IJ SOLN: 1.0000 mg | Freq: Four times a day (QID) | INTRAMUSCULAR | Status: DC | PRN

## 2018-04-09 MED ORDER — OXYCODONE HCL 5 MG PO TABS: 10.0000 mg | ORAL_TABLET | ORAL | Status: DC | PRN

## 2018-04-09 MED ORDER — PRAVASTATIN SODIUM 10 MG PO TABS
20.0000 mg | ORAL_TABLET | Freq: Every evening | ORAL | Status: DC
  Administered 2018-04-09 – 2018-04-13 (×5): 20 mg via ORAL
  Filled 2018-04-09 (×5): qty 2

## 2018-04-09 MED ORDER — LISINOPRIL 20 MG PO TABS
20.0000 mg | ORAL_TABLET | Freq: Every day | ORAL | Status: DC
  Administered 2018-04-09 – 2018-04-14 (×6): 20 mg via ORAL
  Filled 2018-04-09 (×6): qty 1

## 2018-04-09 MED ORDER — PANTOPRAZOLE SODIUM 40 MG PO TBEC
40.0000 mg | DELAYED_RELEASE_TABLET | Freq: Every day | ORAL | Status: DC
  Administered 2018-04-09 – 2018-04-14 (×6): 40 mg via ORAL
  Filled 2018-04-09 (×6): qty 1

## 2018-04-09 MED ORDER — VITAMIN B-1 100 MG PO TABS
100.0000 mg | ORAL_TABLET | Freq: Every day | ORAL | Status: DC
  Administered 2018-04-09 – 2018-04-14 (×6): 100 mg via ORAL
  Filled 2018-04-09 (×6): qty 1

## 2018-04-09 MED ORDER — SODIUM CHLORIDE 0.9 % IV SOLN: 250.0000 mL | INTRAVENOUS | Status: DC | PRN

## 2018-04-09 MED ORDER — MAGNESIUM OXIDE 400 (241.3 MG) MG PO TABS
400.0000 mg | ORAL_TABLET | Freq: Two times a day (BID) | ORAL | Status: DC
  Administered 2018-04-09 – 2018-04-14 (×9): 400 mg via ORAL
  Filled 2018-04-09 (×11): qty 1

## 2018-04-09 MED ORDER — POLYETHYLENE GLYCOL 3350 17 G PO PACK: 17.0000 g | PACK | Freq: Every day | ORAL | Status: DC | PRN

## 2018-04-09 MED ORDER — FOLIC ACID 1 MG PO TABS
1.0000 mg | ORAL_TABLET | Freq: Every day | ORAL | Status: DC
  Administered 2018-04-09 – 2018-04-14 (×6): 1 mg via ORAL
  Filled 2018-04-09 (×6): qty 1

## 2018-04-09 MED ORDER — OXYCODONE HCL 5 MG PO TABS
5.0000 mg | ORAL_TABLET | ORAL | Status: DC | PRN
  Administered 2018-04-10 – 2018-04-13 (×6): 5 mg via ORAL
  Filled 2018-04-09 (×6): qty 1

## 2018-04-09 MED ORDER — ONDANSETRON HCL 4 MG PO TABS: 4.0000 mg | ORAL_TABLET | Freq: Four times a day (QID) | ORAL | Status: DC | PRN

## 2018-04-09 MED ORDER — POTASSIUM CHLORIDE IN NACL 20-0.9 MEQ/L-% IV SOLN
INTRAVENOUS | Status: AC
  Administered 2018-04-09 – 2018-04-12 (×9): via INTRAVENOUS
  Filled 2018-04-09 (×11): qty 1000

## 2018-04-09 MED ORDER — ACETAMINOPHEN 650 MG RE SUPP: 650.0000 mg | Freq: Four times a day (QID) | RECTAL | Status: DC | PRN

## 2018-04-09 MED ORDER — ONDANSETRON HCL 4 MG/2ML IJ SOLN
4.0000 mg | Freq: Four times a day (QID) | INTRAMUSCULAR | Status: DC | PRN
  Administered 2018-04-12: 4 mg via INTRAVENOUS

## 2018-04-09 MED ORDER — LORAZEPAM 1 MG PO TABS: 1.0000 mg | ORAL_TABLET | Freq: Four times a day (QID) | ORAL | Status: DC | PRN

## 2018-04-09 MED ORDER — TRAZODONE HCL 50 MG PO TABS
25.0000 mg | ORAL_TABLET | Freq: Every evening | ORAL | Status: DC | PRN
  Administered 2018-04-13: 50 mg via ORAL
  Filled 2018-04-09: qty 1

## 2018-04-09 MED ORDER — LORAZEPAM 1 MG PO TABS: 0.0000 mg | ORAL_TABLET | Freq: Four times a day (QID) | ORAL | Status: DC

## 2018-04-09 MED ORDER — SODIUM CHLORIDE 0.9 % IV SOLN
INTRAVENOUS | Status: AC
  Administered 2018-04-09: 19:00:00 via INTRAVENOUS

## 2018-04-09 MED ORDER — SODIUM CHLORIDE 0.9 % IV SOLN
1.0000 g | Freq: Three times a day (TID) | INTRAVENOUS | Status: DC
  Administered 2018-04-09 – 2018-04-13 (×11): 1 g via INTRAVENOUS
  Filled 2018-04-09 (×12): qty 1

## 2018-04-09 MED ORDER — LORAZEPAM 1 MG PO TABS: 0.0000 mg | ORAL_TABLET | Freq: Two times a day (BID) | ORAL | Status: DC

## 2018-04-09 MED ORDER — THIAMINE HCL 100 MG/ML IJ SOLN
100.0000 mg | Freq: Every day | INTRAMUSCULAR | Status: DC
  Filled 2018-04-09: qty 2

## 2018-04-09 NOTE — ED Provider Notes (Signed)
[facility] EMERGENCY DEPARTMENT Provider Note   CSN: [id number] Arrival date & time: 04/09/18  1055     History   Chief Complaint Chief Complaint  Patient presents with  . Rectal Bleeding    HPI Logan Phillips is a 68 y.o. male.  HPI 68 year old male on an intermittent daily aspirin presents the emergency department with bright red blood from his rectum that started last night.  He reports he is had 4-5 episodes of bright red blood with clots.  No use of other anticoagulants.  He reports some mild worsening abdominal pain in his lower abdomen over the past 24 hours.  No fevers or chills.  No history of diverticulitis.  Patient is never had rectal bleeding like this before.  No chest pain or shortness of breath.  Symptoms are moderate in severity.   Past Medical History:  Diagnosis Date  . Chronic pain in shoulder 2018   [place]  . H/O echocardiogram 10/08/2016   EF 65-70%, normal wall motion, systolic function vigorous  . Hearing loss    pending hearing aids 06/2014  . Hypertension   . Lipoma   . Varicose vein     Patient Active Problem List   Diagnosis Date Noted  . Arthritis 02/22/2018  . Alcohol consumption heavy 02/22/2018  . Gastroesophageal reflux disease 02/22/2018  . Skin lesion 10/05/2017  . Ataxia 10/05/2017  . Dizziness 10/05/2017  . Memory change 10/05/2017  . Chronic right shoulder pain 03/18/2017  . Screening for diabetes mellitus 10/04/2016  . Vaccine counseling 10/04/2016  . Encounter for health maintenance examination in adult 10/04/2016  . Need for pneumococcal vaccination 10/04/2016  . Lipoma of torso 10/04/2016  . Hearing difficulty of both ears 10/04/2016  . Varicose veins of left lower extremity   . Essential hypertension 07/08/2014  . Foot pain, left 04/14/2011    Past Surgical History:  Procedure Laterality Date  . APPENDECTOMY    . COLONOSCOPY     age 12, he did not f/u with referral 2018  .  LUMBAR DISC SURGERY  2002   Dr. Rita Ohara  . WISDOM TOOTH EXTRACTION          Home Medications    Prior to Admission medications   Medication Sig Start Date End Date Taking? Authorizing Provider  Calcium Carbonate-Vitamin D (CALCIUM 500 + D) 500-125 MG-UNIT TABS Take by mouth.    [provider]  fish oil-omega-3 fatty acids 1000 MG capsule Take 1 g by mouth daily.    [provider]  HYDROcodone-acetaminophen (NORCO) 10-325 MG tablet Take 1 tablet by mouth every 6 (six) hours as needed. 03/29/18   Tysinger, Camelia Eng, PA-C  lisinopril (PRINIVIL,ZESTRIL) 20 MG tablet Take 1 tablet (20 mg total) by mouth daily. 10/06/17   Tysinger, Camelia Eng, PA-C  magnesium oxide (MAG-OX) 400 MG tablet Take 400 mg by mouth 2 (two) times daily.    [provider]  meloxicam (MOBIC) 15 MG tablet Take 1 tablet (15 mg total) by mouth daily. 11/16/17   Tysinger, Camelia Eng, PA-C  metoprolol tartrate (LOPRESSOR) 100 MG tablet Take 1 tablet (100 mg total) by mouth 2 (two) times daily. 10/06/17   Tysinger, Camelia Eng, PA-C  milk thistle 175 MG tablet Take 175 mg by mouth daily.    [provider]  Multiple Vitamin (MULITIVITAMIN WITH MINERALS) TABS Take 1 tablet by mouth daily.    [provider]  omeprazole (PRILOSEC) 40 MG capsule Take 1 capsule (40  mg total) by mouth daily. 02/22/18   Tysinger, Camelia Eng, PA-C  Potassium 75 MG TABS Take by mouth.    [provider]  pravastatin (PRAVACHOL) 20 MG tablet Take 1 tablet (20 mg total) by mouth every evening. 10/06/17 10/06/18  Tysinger, Camelia Eng, PA-C  SUPER B COMPLEX/C PO Take by mouth.    [provider]  zinc gluconate 50 MG tablet Take 50 mg by mouth daily.    [provider]    Family History Family History  Problem Relation Age of Onset  . Hypertension Mother   . Dementia Father   . Parkinsonism Father   . Diabetes Father        early stages  . Cancer Maternal Grandmother        breast  . Heart  disease Neg Hx   . Stroke Neg Hx     Social History Social History   Tobacco Use  . Smoking status: Never Smoker  . Smokeless tobacco: Never Used  Substance Use Topics  . Alcohol use: Yes    Alcohol/week: 32.0 standard drinks    Types: 30 Cans of beer, 2 Shots of liquor per week  . Drug use: No     Allergies   Patient has no known allergies.   Review of Systems Review of Systems  All other systems reviewed and are negative.    Physical Exam Updated Vital Signs BP (!) 151/89   Pulse (!) 59   Temp 98.3 F (36.8 C) (Oral)   Resp (!) 21   SpO2 98%   Physical Exam Vitals signs and nursing note reviewed.  Constitutional:      Appearance: He is well-developed.  HENT:     Head: Normocephalic and atraumatic.  Neck:     Musculoskeletal: Normal range of motion.  Cardiovascular:     Rate and Rhythm: Normal rate and regular rhythm.     Heart sounds: Normal heart sounds.  Pulmonary:     Effort: Pulmonary effort is normal. No respiratory distress.     Breath sounds: Normal breath sounds.  Abdominal:     General: There is no distension.     Palpations: Abdomen is soft.     Comments: Mild bilateral lower quadrant abdominal tenderness right greater than left  Musculoskeletal: Normal range of motion.  Skin:    General: Skin is warm and dry.  Neurological:     Mental Status: He is alert and oriented to person, place, and time.  Psychiatric:        Judgment: Judgment normal.      ED Treatments / Results  Labs (all labs ordered are listed, but only abnormal results are displayed) Labs Reviewed  CBC - Abnormal; Notable for the following components:      Result Value   WBC 12.1 (*)    All other components within normal limits  COMPREHENSIVE METABOLIC PANEL - Abnormal; Notable for the following components:   Glucose, Bld 133 (*)    Total Bilirubin 1.3 (*)    All other components within normal limits  TYPE AND SCREEN  ABO/RH    EKG None  Radiology Ct  Abdomen Pelvis W Contrast  Result Date: 04/09/2018 CLINICAL DATA:  Rectal bleeding beginning last night. Lower abdominal pain. EXAM: CT ABDOMEN AND PELVIS WITH CONTRAST TECHNIQUE: Multidetector CT imaging of the abdomen and pelvis was performed using the standard protocol following bolus administration of intravenous contrast. CONTRAST:  137mL OMNIPAQUE IOHEXOL 300 MG/ML  SOLN COMPARISON:  None. FINDINGS: Lower  chest: Normal Hepatobiliary: Mild fatty change of the liver. Two subcentimeter cysts. Gallbladder appears normal by CT. Pancreas: Normal Spleen: Normal Adrenals/Urinary Tract: Adrenal glands are normal. Kidneys are normal except for an insignificant 1 cm cyst on the right. No mass, stone or hydronephrosis. Bladder is normal. Stomach/Bowel: Segmental colitis beginning at the splenic flexure and extending to the distal descending colon. This distribution is concerning for ischemic insult, but inflammatory bowel disease and infectious colitis are not excluded. No sign of perforation or abscess. Small bowel appears normal. Remainder of the colon is normal. Vascular/Lymphatic: Atherosclerosis of the aorta but without aneurysm or dissection. IVC is normal. No retroperitoneal adenopathy. Reproductive: Normal Other: No free fluid or air. Musculoskeletal: Ordinary lower lumbar degenerative changes. IMPRESSION: Segmental colitis beginning at the splenic flexure and extending to the distal descending colon. This distribution is concerning for ischemic insult, but inflammatory bowel disease and infectious colitis are not excluded. No perforation or abscess. Electronically Signed   By: Nelson Chimes M.D.   On: 04/09/2018 14:12    Procedures Procedures (including critical care time)  Medications Ordered in ED Medications  morphine 4 MG/ML injection 4 mg (4 mg Intravenous Given 04/09/18 1235)  iohexol (OMNIPAQUE) 300 MG/ML solution 100 mL (100 mLs Intravenous Contrast Given 04/09/18 1335)     Initial Impression  / Assessment and Plan / ED Course  I have reviewed the triage vital signs and the nursing notes.  Pertinent labs & imaging results that were available during my care of the patient were reviewed by me and considered in my medical decision making (see chart for details).    Acute hemorrhagic colitis.  Vital signs are stable.  Hemoglobin is 15.  Continues to have ongoing discomfort and pain at this time.  [place] for symptom control.  Case discussed with Dr. Carol Ada, gastroenterology who will see in consultation tomorrow.  Patient be admitted to the hospitalist service.   Final Clinical Impressions(s) / ED Diagnoses   Final diagnoses:  Acute hemorrhagic colitis    ED Discharge Orders    None       Jola Schmidt, MD 04/09/18 [phone]

## 2018-04-09 NOTE — Consult Note (Signed)
Reason for Consult: Ischemic colitis Referring Physician: [facility]  [name] HPI: This is a 67 year old male with a PMH of HTN admitted for lower abdominal pain and hematochezia.  Last evening he woke up with the sense of bowel urgency.  When he had the bowel movement, which was associated with pain, he passed blood.  Throughout the early morning hours into the daytime hours he continued to have abdominal pain and hematochezia, which precipitated his presentation to the ER.  A CTscan was performed and it was positive for an ischemic colitis, however, other inflammatory etiologies were in the differential.  His WBC was mildly elevated at 12.1 and his HGB was stable tat 15 g/dL, which is his baseline.  In the past he reports having a colonoscopy locally, but he does not remember who performed the procedure.  It was a normal screening colonoscopy.  Currently he is feeling better, but he still has lower abdominal pain.  Past Medical History:  Diagnosis Date  . Chronic pain in shoulder 2018   [place]  . H/O echocardiogram 10/08/2016   EF 65-70%, normal wall motion, systolic function vigorous  . Hearing loss    pending hearing aids 06/2014  . Hypertension   . Lipoma   . Varicose vein     Past Surgical History:  Procedure Laterality Date  . APPENDECTOMY    . COLONOSCOPY     age 13, he did not f/u with referral 2018  . LUMBAR DISC SURGERY  2002   Dr. Rita Ohara  . WISDOM TOOTH EXTRACTION      Family History  Problem Relation Age of Onset  . Hypertension Mother   . Dementia Father   . Parkinsonism Father   . Diabetes Father        early stages  . Cancer Maternal Grandmother        breast  . Heart disease Neg Hx   . Stroke Neg Hx     Social History:  reports that he has never smoked. He has never used smokeless tobacco. He reports current alcohol use of about 32.0 standard drinks of alcohol per week. He reports that he does not use drugs.  Allergies: No  Known Allergies  Medications:  Scheduled: . folic acid  1 mg Oral Daily  . lisinopril  20 mg Oral Daily  . LORazepam  0-4 mg Oral Q6H   Followed by  . [START ON 04/11/2018] LORazepam  0-4 mg Oral Q12H  . magnesium oxide  400 mg Oral BID  . metoprolol tartrate  100 mg Oral BID  . multivitamin with minerals  1 tablet Oral Daily  . pantoprazole  40 mg Oral Daily  . pravastatin  20 mg Oral QPM  . sodium chloride flush  3 mL Intravenous Q12H  . sodium chloride flush  3 mL Intravenous Q12H  . thiamine  100 mg Oral Daily   Or  . thiamine  100 mg Intravenous Daily   Continuous: . sodium chloride    . sodium chloride    . 0.9 % NaCl with KCl 20 mEq / L    . ceFEPime (MAXIPIME) IV    . metronidazole      Results for orders placed or performed during the hospital encounter of 04/09/18 (from the past 24 hour(s))  CBC     Status: Abnormal   Collection Time: 04/09/18 12:21 PM  Result Value Ref Range   WBC 12.1 (H) 4.0 - 10.5 K/uL   RBC  5.00 4.22 - 5.81 MIL/uL   Hemoglobin 15.7 13.0 - 17.0 g/dL   HCT 44.7 39.0 - 52.0 %   MCV 89.4 80.0 - 100.0 fL   MCH 31.4 26.0 - 34.0 pg   MCHC 35.1 30.0 - 36.0 g/dL   RDW 13.3 11.5 - 15.5 %   Platelets 153 150 - 400 K/uL   nRBC 0.0 0.0 - 0.2 %  Comprehensive metabolic panel     Status: Abnormal   Collection Time: 04/09/18 12:21 PM  Result Value Ref Range   Sodium 135 135 - 145 mmol/L   Potassium 4.5 3.5 - 5.1 mmol/L   Chloride 98 98 - 111 mmol/L   CO2 22 22 - 32 mmol/L   Glucose, Bld 133 (H) 70 - 99 mg/dL   BUN 19 8 - 23 mg/dL   Creatinine, Ser 1.20 0.61 - 1.24 mg/dL   Calcium 9.2 8.9 - 10.3 mg/dL   Total Protein 7.4 6.5 - 8.1 g/dL   Albumin 4.2 3.5 - 5.0 g/dL   AST 36 15 - 41 U/L   ALT 30 0 - 44 U/L   Alkaline Phosphatase 44 38 - 126 U/L   Total Bilirubin 1.3 (H) 0.3 - 1.2 mg/dL   GFR calc non Af Amer >60 >60 mL/min   GFR calc Af Amer >60 >60 mL/min   Anion gap 15 5 - 15  ABO/Rh     Status: None   Collection Time: 04/09/18 12:22 PM   Result Value Ref Range   ABO/RH(D)      O POS Performed at [facility] [address], [place], [place] [address]   Type and screen [place]     Status: None   Collection Time: 04/09/18 12:27 PM  Result Value Ref Range   ABO/RH(D) O POS    Antibody Screen NEG    Sample Expiration      04/12/2018 Performed at [facility], [place] [address] [place], [place] [address]      Ct Abdomen Pelvis W Contrast  Result Date: 04/09/2018 CLINICAL DATA:  Rectal bleeding beginning last night. Lower abdominal pain. EXAM: CT ABDOMEN AND PELVIS WITH CONTRAST TECHNIQUE: Multidetector CT imaging of the abdomen and pelvis was performed using the standard protocol following bolus administration of intravenous contrast. CONTRAST:  159mL OMNIPAQUE IOHEXOL 300 MG/ML  SOLN COMPARISON:  None. FINDINGS: Lower chest: Normal Hepatobiliary: Mild fatty change of the liver. Two subcentimeter cysts. Gallbladder appears normal by CT. Pancreas: Normal Spleen: Normal Adrenals/Urinary Tract: Adrenal glands are normal. Kidneys are normal except for an insignificant 1 cm cyst on the right. No mass, stone or hydronephrosis. Bladder is normal. Stomach/Bowel: Segmental colitis beginning at the splenic flexure and extending to the distal descending colon. This distribution is concerning for ischemic insult, but inflammatory bowel disease and infectious colitis are not excluded. No sign of perforation or abscess. Small bowel appears normal. Remainder of the colon is normal. Vascular/Lymphatic: Atherosclerosis of the aorta but without aneurysm or dissection. IVC is normal. No retroperitoneal adenopathy. Reproductive: Normal Other: No free fluid or air. Musculoskeletal: Ordinary lower lumbar degenerative changes. IMPRESSION: Segmental colitis beginning at the splenic flexure and extending to the distal descending colon. This distribution is concerning for ischemic insult, but inflammatory bowel disease and  infectious colitis are not excluded. No perforation or abscess. Electronically Signed   By: Nelson Chimes M.D.   On: 04/09/2018 14:12    ROS:  As stated above in the HPI otherwise negative.  Blood pressure (!) 149/95, pulse 60, temperature 98.3 F (36.8 C), temperature source Oral, resp. rate 16, SpO2 99 %.    PE: Gen: NAD, Alert and Oriented HEENT:  [place]/AT, EOMI Neck: Supple, no LAD Lungs: CTA Bilaterally CV: RRR without M/G/R ABM: Soft, tender in the left side and lower abdomen, +BS Ext: No C/C/E  Assessment/Plan: 1) Ischemic colitis. 2) Lower abdominal pain. 3) Hematochezia.   The patient's clinical presentation is consistent with an ischemic colitis.  He is stable and the elevated WBC is expected.  This issue should pass soon.  He would benefit with an inpatient colonoscopy, but he is in discomfort and he does not feel up to moving around for a prep.  It is reasonable to perform the colonoscopy as an outpatient if his symptoms quickly improve/resolve.  Plan: 1)  Supportive care. 2) Follow HGB and transfuse if necessary.  Davidlee Jeanbaptiste D 04/09/2018, 5:55 PM

## 2018-04-09 NOTE — H&P (Signed)
History and Physical    Logan Phillips VOH:[id number] DOB: April 22, 1950 DOA: 04/09/2018  PCP: Carlena Hurl, PA-C  Patient coming from: Home  I have personally briefly reviewed patient's old medical records in [place]  Chief Complaint: Blood per rectum  HPI: Logan Phillips is a 68 y.o. male with medical history significant of heavy alcohol use, chronic shoulder pain scheduled for surgery on Monday, hearing loss, hypertension and gastroesophageal reflux disease who presented to the emergency department today with complaints of blood per rectum.  Patient states bleeding began last night and he has had 4-5 episodes of bright red blood with clots.  He has no use of anticoagulants.  Had abdominal pain which has been worsening in the lower abdomen over the past 24 hours.  He reports no fevers or chills.  No prior history of diverticulitis.  He has never had rectal bleeding like this before.  He does not have chest pain or shortness of breath and his symptoms are moderate in severity.  With regards to his alcohol use the patient states that he drinks 5 to 612 ounce beers a day with a couple of shots of whiskey associated with it.  He last drank yesterday.  He has never had more than 2448 hrs. without drinking.  He does not know if he goes into DTs.  ED Course: CT scan shows ischemic colitis, hemoglobin is 15 and stable, pressure 170/96, pulse 59, white blood cell count 12.  Review of Systems: As per HPI otherwise all other systems reviewed and  negative.    Past Medical History:  Diagnosis Date  . Chronic pain in shoulder 2018   [place]  . H/O echocardiogram 10/08/2016   EF 65-70%, normal wall motion, systolic function vigorous  . Hearing loss    pending hearing aids 06/2014  . Hypertension   . Lipoma   . Varicose vein     Past Surgical History:  Procedure Laterality Date  . APPENDECTOMY    . COLONOSCOPY     age 46, he did not f/u with referral 2018  . LUMBAR  DISC SURGERY  2002   Dr. Rita Ohara  . WISDOM TOOTH EXTRACTION      Social History   Social History Narrative   Works in Manufacturing systems engineer, walking at least 4 miles daily, golf, [employer], single, 3 children, 3 girls.  Divorced.  As of 7/ 2019     reports that he has never smoked. He has never used smokeless tobacco. He reports current alcohol use of about 32.0 standard drinks of alcohol per week. He reports that he does not use drugs.  No Known Allergies  Family History  Problem Relation Age of Onset  . Hypertension Mother   . Dementia Father   . Parkinsonism Father   . Diabetes Father        early stages  . Cancer Maternal Grandmother        breast  . Heart disease Neg Hx   . Stroke Neg Hx      Prior to Admission medications   Medication Sig Start Date End Date Taking? Authorizing Provider  Calcium Carbonate-Vitamin D (CALCIUM 500 + D) 500-125 MG-UNIT TABS Take 1 tablet by mouth daily.    Yes [provider]  fish oil-omega-3 fatty acids 1000 MG capsule Take 1 g by mouth daily.   Yes [provider]  lisinopril (PRINIVIL,ZESTRIL) 20 MG tablet Take 1 tablet (20 mg total) by mouth daily. 10/06/17  Yes  Tysinger, Camelia Eng, PA-C  magnesium oxide (MAG-OX) 400 MG tablet Take 400 mg by mouth 2 (two) times daily.   Yes [provider]  meloxicam (MOBIC) 15 MG tablet Take 1 tablet (15 mg total) by mouth daily. 11/16/17  Yes Tysinger, Camelia Eng, PA-C  metoprolol tartrate (LOPRESSOR) 100 MG tablet Take 1 tablet (100 mg total) by mouth 2 (two) times daily. 10/06/17  Yes Tysinger, Camelia Eng, PA-C  milk thistle 175 MG tablet Take 175 mg by mouth daily.   Yes [provider]  Multiple Vitamin (MULITIVITAMIN WITH MINERALS) TABS Take 1 tablet by mouth daily.   Yes [provider]  omeprazole (PRILOSEC) 40 MG capsule Take 1 capsule (40 mg total) by mouth daily. 02/22/18  Yes Tysinger, Camelia Eng, PA-C  Potassium 75 MG TABS Take 1 tablet by mouth daily.    Yes [provider]  pravastatin (PRAVACHOL) 20 MG tablet Take 1 tablet (20 mg total) by mouth every evening. 10/06/17 10/06/18 Yes Tysinger, Camelia Eng, PA-C  SUPER B COMPLEX/C PO Take 1 tablet by mouth daily.    Yes [provider]  zinc gluconate 50 MG tablet Take 50 mg by mouth daily.   Yes [provider]    Physical Exam:  Constitutional: NAD, calm, comfortable Vitals:   04/09/18 1115 04/09/18 1145 04/09/18 1718 04/09/18 1721  BP: (!) 151/89 (!) 170/96 (!) 149/95 (!) 149/95  Pulse: (!) 59 (!) 57 60 60  Resp: (!) 21 20  16   Temp:      TempSrc:      SpO2: 98% 99%  99%   Eyes: PERRL, lids and conjunctivae normal ENMT: Mucous membranes are moist. Posterior pharynx clear of any exudate or lesions.Normal dentition.  Neck: normal, supple, no masses, no thyromegaly Respiratory: clear to auscultation bilaterally, no wheezing, no crackles. Normal respiratory effort. No accessory muscle use.  Cardiovascular: Regular rate and rhythm, no murmurs / rubs / gallops. No extremity edema. 2+ pedal pulses. No carotid bruits.  Abdomen: Lower abdominal tenderness, no masses palpated. No hepatosplenomegaly. Bowel sounds positive.  Musculoskeletal: no clubbing / cyanosis. No joint deformity upper and lower extremities. Good ROM, no contractures. Normal muscle tone.  Skin: no rashes, lesions, ulcers. No induration, no spider angiomata Neurologic: CN 2-12 grossly intact. Sensation intact, DTR normal. Strength 5/5 in all 4.  Psychiatric: Normal judgment and insight. Alert and oriented x 3. Normal mood.    Labs on Admission: I have personally reviewed following labs and imaging studies  CBC: Recent Labs  Lab 04/09/18 1221  WBC 12.1*  HGB 15.7  HCT 44.7  MCV 89.4  PLT 371   Basic Metabolic Panel: Recent Labs  Lab 04/09/18 1221  NA 135  K 4.5  CL 98  CO2 22  GLUCOSE 133*  BUN 19  CREATININE 1.20  CALCIUM 9.2   GFR: CrCl cannot be calculated (Unknown ideal weight.). Liver  Function Tests: Recent Labs  Lab 04/09/18 1221  AST 36  ALT 30  ALKPHOS 44  BILITOT 1.3*  PROT 7.4  ALBUMIN 4.2   No results for input(s): LIPASE, AMYLASE in the last 168 hours. No results for input(s): AMMONIA in the last 168 hours. Coagulation Profile: No results for input(s): INR, PROTIME in the last 168 hours. Cardiac Enzymes: No results for input(s): CKTOTAL, CKMB, CKMBINDEX, TROPONINI in the last 168 hours. BNP (last 3 results) No results for input(s): PROBNP in the last 8760 hours. HbA1C: No results for input(s): HGBA1C in the last 72 hours.  CBG: No results for input(s): GLUCAP in the last 168 hours. Lipid Profile: No results for input(s): CHOL, HDL, LDLCALC, TRIG, CHOLHDL, LDLDIRECT in the last 72 hours. Thyroid Function Tests: No results for input(s): TSH, T4TOTAL, FREET4, T3FREE, THYROIDAB in the last 72 hours. Anemia Panel: No results for input(s): VITAMINB12, FOLATE, FERRITIN, TIBC, IRON, RETICCTPCT in the last 72 hours. Urine analysis:    Component Value Date/Time   LABSPEC 1.020 10/05/2017 0956   BILIRUBINUR negative 10/05/2017 0956   BILIRUBINUR NEG 07/03/2013 1015   KETONESUR negative 10/05/2017 0956   PROTEINUR negative 10/05/2017 0956   PROTEINUR NEG 07/03/2013 1015   UROBILINOGEN negative 07/03/2013 1015   NITRITE Negative 10/05/2017 0956   NITRITE NEG 07/03/2013 1015   LEUKOCYTESUR Trace (A) 10/05/2017 0956    Radiological Exams on Admission: Ct Abdomen Pelvis W Contrast  Result Date: 04/09/2018 CLINICAL DATA:  Rectal bleeding beginning last night. Lower abdominal pain. EXAM: CT ABDOMEN AND PELVIS WITH CONTRAST TECHNIQUE: Multidetector CT imaging of the abdomen and pelvis was performed using the standard protocol following bolus administration of intravenous contrast. CONTRAST:  128mL OMNIPAQUE IOHEXOL 300 MG/ML  SOLN COMPARISON:  None. FINDINGS: Lower chest: Normal Hepatobiliary: Mild fatty change of the liver. Two subcentimeter cysts. Gallbladder  appears normal by CT. Pancreas: Normal Spleen: Normal Adrenals/Urinary Tract: Adrenal glands are normal. Kidneys are normal except for an insignificant 1 cm cyst on the right. No mass, stone or hydronephrosis. Bladder is normal. Stomach/Bowel: Segmental colitis beginning at the splenic flexure and extending to the distal descending colon. This distribution is concerning for ischemic insult, but inflammatory bowel disease and infectious colitis are not excluded. No sign of perforation or abscess. Small bowel appears normal. Remainder of the colon is normal. Vascular/Lymphatic: Atherosclerosis of the aorta but without aneurysm or dissection. IVC is normal. No retroperitoneal adenopathy. Reproductive: Normal Other: No free fluid or air. Musculoskeletal: Ordinary lower lumbar degenerative changes. IMPRESSION: Segmental colitis beginning at the splenic flexure and extending to the distal descending colon. This distribution is concerning for ischemic insult, but inflammatory bowel disease and infectious colitis are not excluded. No perforation or abscess. Electronically Signed   By: Nelson Chimes M.D.   On: 04/09/2018 14:12      Assessment/Plan Principal Problem:   Acute ischemic colitis ([place]) Active Problems:   Alcohol consumption heavy   Essential hypertension   Gastroesophageal reflux disease    1.  Acute ischemic colitis: History and physical examination consistent with this given elevated white blood cell count, and bleeding this is consistent with a moderate to severe episode of ischemic colitis.  We will start the patient on prophylactic antibiotics for 24 hours and monitor response.  Will consult GI for further evaluation and management.  2.  Heavy alcohol consumption: We will start the patient on alcohol prophylaxis protocol with CIWA score.  We will give him oral Ativan but if he decompensates will require IV.  3.  Essential hypertension: Continue home medications which are lisinopril and  metoprolol  4.  Gastroesophageal reflux disease: Continue pump inhibitor  DVT prophylaxis: SCDs Code Status: Full code Family Communication: No family present at the time of admission.  Patient retains capacity. Disposition Plan: Likely home in 2 to 3 days Consults called: GI: Dr. Carol Ada Admission status: Inpatient   Lady Deutscher MD [facility] Pager [phone]  If 7PM-7AM, please contact night-coverage [email or web address] Password [facility]  04/09/2018, 5:39 PM

## 2018-04-09 NOTE — ED Notes (Signed)
ED TO INPATIENT HANDOFF REPORT  Name/Age/Gender Logan Phillips 68 y.o. male  Code Status   Home/SNF/Other Home  Chief Complaint rectal bleeding   Level of Care/Admitting Diagnosis ED Disposition    ED Disposition Condition [facility] Area: [place] [[id number]]  Level of Care: Med-Surg [16]  Diagnosis: Acute ischemic colitis [facility]) [182993]  Admitting Physician: Lady Deutscher [[id number]]  Attending Physician: Lady Deutscher [[id number]]  Estimated length of stay: past midnight tomorrow  Certification:: I certify this patient will need inpatient services for at least 2 midnights  PT Class (Do Not Modify): Inpatient [101]  PT Acc Code (Do Not Modify): Private [1]       Medical History Past Medical History:  Diagnosis Date  . Chronic pain in shoulder 2018   [place]  . H/O echocardiogram 10/08/2016   EF 65-70%, normal wall motion, systolic function vigorous  . Hearing loss    pending hearing aids 06/2014  . Hypertension   . Lipoma   . Varicose vein     Allergies No Known Allergies  IV Location/Drains/Wounds Patient Lines/Drains/Airways Status   Active Line/Drains/Airways    Name:   Placement date:   Placement time:   Site:   Days:   Peripheral IV 04/09/18 Right Antecubital   04/09/18    -    Antecubital   less than 1          Labs/Imaging Results for orders placed or performed during the hospital encounter of 04/09/18 (from the past 48 hour(s))  CBC     Status: Abnormal   Collection Time: 04/09/18 12:21 PM  Result Value Ref Range   WBC 12.1 (H) 4.0 - 10.5 K/uL   RBC 5.00 4.22 - 5.81 MIL/uL   Hemoglobin 15.7 13.0 - 17.0 g/dL   HCT 44.7 39.0 - 52.0 %   MCV 89.4 80.0 - 100.0 fL   MCH 31.4 26.0 - 34.0 pg   MCHC 35.1 30.0 - 36.0 g/dL   RDW 13.3 11.5 - 15.5 %   Platelets 153 150 - 400 K/uL   nRBC 0.0 0.0 - 0.2 %    Comment: Performed at [facility], [place] [address], [place], [place] [address]   Comprehensive metabolic panel     Status: Abnormal   Collection Time: 04/09/18 12:21 PM  Result Value Ref Range   Sodium 135 135 - 145 mmol/L   Potassium 4.5 3.5 - 5.1 mmol/L   Chloride 98 98 - 111 mmol/L   CO2 22 22 - 32 mmol/L   Glucose, Bld 133 (H) 70 - 99 mg/dL   BUN 19 8 - 23 mg/dL   Creatinine, Ser 1.20 0.61 - 1.24 mg/dL   Calcium 9.2 8.9 - 10.3 mg/dL   Total Protein 7.4 6.5 - 8.1 g/dL   Albumin 4.2 3.5 - 5.0 g/dL   AST 36 15 - 41 U/L   ALT 30 0 - 44 U/L   Alkaline Phosphatase 44 38 - 126 U/L   Total Bilirubin 1.3 (H) 0.3 - 1.2 mg/dL   GFR calc non Af Amer >60 >60 mL/min   GFR calc Af Amer >60 >60 mL/min   Anion gap 15 5 - 15    Comment: Performed at [facility] [address], [place], [place] [address]  ABO/Rh     Status: None   Collection Time: 04/09/18 12:22 PM  Result Value Ref Range   ABO/RH(D)  O POS Performed at [facility], [place] [address] [place], [place] [address]   Type and screen [place]     Status: None   Collection Time: 04/09/18 12:27 PM  Result Value Ref Range   ABO/RH(D) O POS    Antibody Screen NEG    Sample Expiration      04/12/2018 Performed at [facility], [place] [address], [place], [place] [address]    Ct Abdomen Pelvis W Contrast  Result Date: 04/09/2018 CLINICAL DATA:  Rectal bleeding beginning last night. Lower abdominal pain. EXAM: CT ABDOMEN AND PELVIS WITH CONTRAST TECHNIQUE: Multidetector CT imaging of the abdomen and pelvis was performed using the standard protocol following bolus administration of intravenous contrast. CONTRAST:  130mL OMNIPAQUE IOHEXOL 300 MG/ML  SOLN COMPARISON:  None. FINDINGS: Lower chest: Normal Hepatobiliary: Mild fatty change of the liver. Two subcentimeter cysts. Gallbladder appears normal by CT. Pancreas: Normal Spleen: Normal Adrenals/Urinary Tract: Adrenal glands are normal. Kidneys are normal except for an insignificant 1 cm cyst on the right. No mass, stone or  hydronephrosis. Bladder is normal. Stomach/Bowel: Segmental colitis beginning at the splenic flexure and extending to the distal descending colon. This distribution is concerning for ischemic insult, but inflammatory bowel disease and infectious colitis are not excluded. No sign of perforation or abscess. Small bowel appears normal. Remainder of the colon is normal. Vascular/Lymphatic: Atherosclerosis of the aorta but without aneurysm or dissection. IVC is normal. No retroperitoneal adenopathy. Reproductive: Normal Other: No free fluid or air. Musculoskeletal: Ordinary lower lumbar degenerative changes. IMPRESSION: Segmental colitis beginning at the splenic flexure and extending to the distal descending colon. This distribution is concerning for ischemic insult, but inflammatory bowel disease and infectious colitis are not excluded. No perforation or abscess. Electronically Signed   By: Nelson Chimes M.D.   On: 04/09/2018 14:12    Pending Labs Unresulted Labs (From admission, onward)   None      Vitals/Pain Today's Vitals   04/09/18 1107 04/09/18 1107 04/09/18 1115 04/09/18 1145  BP:  (!) 125/104 (!) 151/89 (!) 170/96  Pulse:  (!) 57 (!) 59 (!) 57  Resp:  18 (!) 21 20  Temp:  98.3 F (36.8 C)    TempSrc:  Oral    SpO2:  99% 98% 99%  PainSc: 0-No pain       Isolation Precautions No active isolations  Medications Medications  0.9 %  sodium chloride infusion (has no administration in time range)  metroNIDAZOLE (FLAGYL) IVPB 500 mg (has no administration in time range)  morphine 4 MG/ML injection 4 mg (4 mg Intravenous Given 04/09/18 1235)  iohexol (OMNIPAQUE) 300 MG/ML solution 100 mL (100 mLs Intravenous Contrast Given 04/09/18 1335)    Mobility walks

## 2018-04-09 NOTE — ED Triage Notes (Signed)
Pt brought in by [facility] from home for rectal bleeding that started last night, pt states he has had 4-5 episodes. Pt endorses lower abdominal pain and generalized weakness. Pt is A+Ox4, skin warm and dry. Pt states he is passing large clots.

## 2018-04-10 ENCOUNTER — Encounter (HOSPITAL_COMMUNITY): Payer: Self-pay | Admitting: General Practice

## 2018-04-10 DIAGNOSIS — Z789 Other specified health status: Secondary | ICD-10-CM

## 2018-04-10 DIAGNOSIS — I10 Essential (primary) hypertension: Secondary | ICD-10-CM

## 2018-04-10 LAB — BASIC METABOLIC PANEL
Anion gap: 10 (ref 5–15)
BUN: 14 mg/dL (ref 8–23)
CO2: 23 mmol/L (ref 22–32)
Calcium: 8.4 mg/dL — ABNORMAL LOW (ref 8.9–10.3)
Chloride: 102 mmol/L (ref 98–111)
Creatinine, Ser: 1.1 mg/dL (ref 0.61–1.24)
GFR calc Af Amer: >60 mL/min (ref >60)
GFR calc non Af Amer: >60 mL/min (ref >60)
GLUCOSE: 110 mg/dL — AB (ref 70–99)
Potassium: 4.1 mmol/L (ref 3.5–5.1)
Sodium: 135 mmol/L (ref 135–145)

## 2018-04-10 LAB — CBC
HCT: 40.1 % (ref 39.0–52.0)
Hemoglobin: 14.2 g/dL (ref 13.0–17.0)
MCH: 31.7 pg (ref 26.0–34.0)
MCHC: 35.4 g/dL (ref 30.0–36.0)
MCV: 89.5 fL (ref 80.0–100.0)
Platelets: 134 10*3/uL — ABNORMAL LOW (ref 150–400)
RBC: 4.48 MIL/uL (ref 4.22–5.81)
RDW: 13.4 % (ref 11.5–15.5)
WBC: 10.7 10*3/uL — ABNORMAL HIGH (ref 4.0–10.5)
nRBC: 0 % (ref 0.0–0.2)

## 2018-04-10 MED ORDER — HYDROMORPHONE HCL 1 MG/ML IJ SOLN
1.0000 mg | Freq: Once | INTRAMUSCULAR | Status: AC
  Administered 2018-04-10: 1 mg via INTRAVENOUS
  Filled 2018-04-10: qty 1

## 2018-04-10 MED ORDER — OXYCODONE-ACETAMINOPHEN 5-325 MG PO TABS
1.0000 | ORAL_TABLET | Freq: Four times a day (QID) | ORAL | Status: DC | PRN
  Administered 2018-04-10 – 2018-04-13 (×6): 1 via ORAL
  Filled 2018-04-10 (×6): qty 1

## 2018-04-10 NOTE — Progress Notes (Signed)
[name] PATIENT Subjective: Patient admitted with rectal bleeding and abdominal pain; CT scan revealed ischemic colitis. Patient claims he had a lot of abdominal pain about an hour after having broth and jello. He required some narcotics for that. He has been feeling much better ever since. He had 4-5 small volume bloody BM's today.   Objective: Vital signs in last 24 hours: Temp:  [98.8 F (37.1 C)-99.7 F (37.6 C)] 99.1 F (37.3 C) (01/27 0429) Pulse Rate:  [60-64] 60 (01/27 0851) Resp:  [16-20] 19 (01/27 0851) BP: (149-177)/(82-103) 158/103 (01/27 0851) SpO2:  [96 %-100 %] 96 % (01/27 0851) Weight:  [77.3 kg] 77.3 kg (01/26 1802) Last BM Date: 04/09/18  Intake/Output from previous day: 01/26 0701 - 01/27 0700 In: 1678.6 [P.O.:30; I.V.:1248.6; IV Piggyback:400] Out: 0  Intake/Output this shift: No intake/output data recorded.  General appearance: alert, cooperative, appears stated age, no distress and pale Resp: clear to auscultation bilaterally Cardio: regular rate and rhythm, S1, S2 normal, no murmur, click, rub or gallop GI: soft, non-tender; bowel sounds normal; no masses,  no organomegaly Extremities: extremities normal, atraumatic, no cyanosis or edema  Lab Results: Recent Labs    04/09/18 1221 04/10/18 0446  WBC 12.1* 10.7*  HGB 15.7 14.2  HCT 44.7 40.1  PLT 153 134*   BMET Recent Labs    04/09/18 1221 04/10/18 0446  NA 135 135  K 4.5 4.1  CL 98 102  CO2 22 23  GLUCOSE 133* 110*  BUN 19 14  CREATININE 1.20 1.10  CALCIUM 9.2 8.4*   LFT Recent Labs    04/09/18 1221  PROT 7.4  ALBUMIN 4.2  AST 36  ALT 30  ALKPHOS 44  BILITOT 1.3*   Studies/Results: Ct Abdomen Pelvis W Contrast  Result Date: 04/09/2018 CLINICAL DATA:  Rectal bleeding beginning last night. Lower abdominal pain. EXAM: CT ABDOMEN AND PELVIS WITH CONTRAST TECHNIQUE: Multidetector CT imaging of the abdomen and pelvis was performed using the standard protocol following bolus  administration of intravenous contrast. CONTRAST:  119mL OMNIPAQUE IOHEXOL 300 MG/ML  SOLN COMPARISON:  None. FINDINGS: Lower chest: Normal Hepatobiliary: Mild fatty change of the liver. Two subcentimeter cysts. Gallbladder appears normal by CT. Pancreas: Normal Spleen: Normal Adrenals/Urinary Tract: Adrenal glands are normal. Kidneys are normal except for an insignificant 1 cm cyst on the right. No mass, stone or hydronephrosis. Bladder is normal. Stomach/Bowel: Segmental colitis beginning at the splenic flexure and extending to the distal descending colon. This distribution is concerning for ischemic insult, but inflammatory bowel disease and infectious colitis are not excluded. No sign of perforation or abscess. Small bowel appears normal. Remainder of the colon is normal. Vascular/Lymphatic: Atherosclerosis of the aorta but without aneurysm or dissection. IVC is normal. No retroperitoneal adenopathy. Reproductive: Normal Other: No free fluid or air. Musculoskeletal: Ordinary lower lumbar degenerative changes. IMPRESSION: Segmental colitis beginning at the splenic flexure and extending to the distal descending colon. This distribution is concerning for ischemic insult, but inflammatory bowel disease and infectious colitis are not excluded. No perforation or abscess. Electronically Signed   By: Nelson Chimes M.D.   On: 04/09/2018 14:12   Medications: I have reviewed the patient's current medications.  Assessment/Plan: 1) Ischermic colitis-continue to monitor. Minimize pains medication use as much as possible.  2) GERD-on PPI's; stop Meloxicam. 3) Alcohol abuse.   LOS: 1 day   Logan Phillips 04/10/2018, 3:20 PM

## 2018-04-10 NOTE — Progress Notes (Signed)
[facility]  PROGRESS NOTE  Logan Phillips ZDG:[id number] DOB: [birth year] DOA: 04/09/2018 PCP: Carlena Hurl, PA-C   Brief HPI:   68 year old male with a history of heavy alcohol use, chronic shoulder pain, hypertension, GERD came to ED with complaints of blood per rectum.  Patient had bleeding last night and had 4-5 episodes of bright red blood with clots.  In the ED CT scan showed ischemic colitis.  Hemoglobin was 15.    Subjective   Patient complains of intermittent abdominal pain.  Still having bloody bowel movements.  Hemoglobin is stable.   Assessment/Plan:     1. Ischemic colitis- GI has seen and deemed that patient has ischemic colitis.  Will discontinue antibiotics at this time.  Start clear liquid diet.  No colonoscopy in the hospital as patient does not want to take prep at this time.  Colonoscopy as outpatient.  2. Alcohol abuse-continue CIWA protocol, no signs or symptoms of alcohol withdrawal.  3. Hypertension-continue home medications, lisinopril, metoprolol.  4. GERD-continue PPI    CBG: No results for input(s): GLUCAP in the last 168 hours.  CBC: Recent Labs  Lab 04/09/18 1221 04/10/18 0446  WBC 12.1* 10.7*  HGB 15.7 14.2  HCT 44.7 40.1  MCV 89.4 89.5  PLT 153 134*    Basic Metabolic Panel: Recent Labs  Lab 04/09/18 1221 04/10/18 0446  NA 135 135  K 4.5 4.1  CL 98 102  CO2 22 23  GLUCOSE 133* 110*  BUN 19 14  CREATININE 1.20 1.10  CALCIUM 9.2 8.4*     DVT prophylaxis: SCDs  Code Status: Full code  Family Communication: No family at bedside  Disposition Plan: likely home when medically ready for discharge     Consultants:  Gastroenterology  Procedures:     Antibiotics:   Anti-infectives (From admission, onward)   Start     Dose/Rate Route Frequency Ordered Stop   04/09/18 1800  ceFEPIme (MAXIPIME) 1 g in sodium chloride 0.9 % 100 mL IVPB     1 g 200 mL/hr over 30 Minutes Intravenous Every 8 hours 04/09/18  1741     04/09/18 1715  metroNIDAZOLE (FLAGYL) IVPB 500 mg     500 mg 100 mL/hr over 60 Minutes Intravenous Every 8 hours 04/09/18 1703         Objective   Vitals:   04/09/18 1802 04/09/18 2108 04/10/18 0429 04/10/18 0851  BP: (!) 177/98 (!) 156/82 (!) 150/88 (!) 158/103  Pulse: 61 64 60 60  Resp: 20 18 18 19   Temp: 98.8 F (37.1 C) 99.7 F (37.6 C) 99.1 F (37.3 C)   TempSrc: Oral Oral Oral   SpO2: 100% 99% 97% 96%  Weight: 77.3 kg     Height: 5\' 7"  (1.702 m)       Intake/Output Summary (Last 24 hours) at 04/10/2018 1440 Last data filed at 04/10/2018 0600 Gross per 24 hour  Intake 1678.55 ml  Output 0 ml  Net 1678.55 ml   Filed Weights   04/09/18 1802  Weight: 77.3 kg     Physical Examination:    General: Appears in no acute distress  Cardiovascular: S1-S2, regular, no murmur auscultated  Respiratory: Clear to auscultation bilaterally  Abdomen: Abdomen is soft, mild generalized tenderness palpation  Extremities: No edema of the lower extremities  Neurologic: Alert, oriented x3, no focal deficit noted.     Data Reviewed: I have personally reviewed following labs and imaging studies   No results found for this or  any previous visit (from the past 240 hour(s)).   Liver Function Tests: Recent Labs  Lab 04/09/18 1221  AST 36  ALT 30  ALKPHOS 44  BILITOT 1.3*  PROT 7.4  ALBUMIN 4.2      Studies: Ct Abdomen Pelvis W Contrast  Result Date: 04/09/2018 CLINICAL DATA:  Rectal bleeding beginning last night. Lower abdominal pain. EXAM: CT ABDOMEN AND PELVIS WITH CONTRAST TECHNIQUE: Multidetector CT imaging of the abdomen and pelvis was performed using the standard protocol following bolus administration of intravenous contrast. CONTRAST:  162mL OMNIPAQUE IOHEXOL 300 MG/ML  SOLN COMPARISON:  None. FINDINGS: Lower chest: Normal Hepatobiliary: Mild fatty change of the liver. Two subcentimeter cysts. Gallbladder appears normal by CT. Pancreas: Normal  Spleen: Normal Adrenals/Urinary Tract: Adrenal glands are normal. Kidneys are normal except for an insignificant 1 cm cyst on the right. No mass, stone or hydronephrosis. Bladder is normal. Stomach/Bowel: Segmental colitis beginning at the splenic flexure and extending to the distal descending colon. This distribution is concerning for ischemic insult, but inflammatory bowel disease and infectious colitis are not excluded. No sign of perforation or abscess. Small bowel appears normal. Remainder of the colon is normal. Vascular/Lymphatic: Atherosclerosis of the aorta but without aneurysm or dissection. IVC is normal. No retroperitoneal adenopathy. Reproductive: Normal Other: No free fluid or air. Musculoskeletal: Ordinary lower lumbar degenerative changes. IMPRESSION: Segmental colitis beginning at the splenic flexure and extending to the distal descending colon. This distribution is concerning for ischemic insult, but inflammatory bowel disease and infectious colitis are not excluded. No perforation or abscess. Electronically Signed   By: Nelson Chimes M.D.   On: 04/09/2018 14:12    Scheduled Meds: . folic acid  1 mg Oral Daily  . lisinopril  20 mg Oral Daily  . LORazepam  0-4 mg Oral Q6H   Followed by  . [START ON 04/11/2018] LORazepam  0-4 mg Oral Q12H  . magnesium oxide  400 mg Oral BID  . metoprolol tartrate  100 mg Oral BID  . multivitamin with minerals  1 tablet Oral Daily  . pantoprazole  40 mg Oral Daily  . pravastatin  20 mg Oral QPM  . sodium chloride flush  3 mL Intravenous Q12H  . sodium chloride flush  3 mL Intravenous Q12H  . thiamine  100 mg Oral Daily   Or  . thiamine  100 mg Intravenous Daily    Admission status: Inpatient: Based on patients clinical presentation and evaluation of above clinical data, I have made determination that patient meets Inpatient criteria at this time.  Time spent: 20 min  [facility] Pager [phone]. If 7PM-7AM, please  contact night-coverage at [email or web address], Office  [phone]  password [email or web address]  04/10/2018, 2:40 PM  LOS: 1 day

## 2018-04-11 ENCOUNTER — Inpatient Hospital Stay (HOSPITAL_COMMUNITY): Payer: Medicare HMO

## 2018-04-11 LAB — CBC
HCT: 37.8 % — ABNORMAL LOW (ref 39.0–52.0)
Hemoglobin: 13.6 g/dL (ref 13.0–17.0)
MCH: 32.1 pg (ref 26.0–34.0)
MCHC: 36 g/dL (ref 30.0–36.0)
MCV: 89.2 fL (ref 80.0–100.0)
Platelets: 122 10*3/uL — ABNORMAL LOW (ref 150–400)
RBC: 4.24 MIL/uL (ref 4.22–5.81)
RDW: 13.3 % (ref 11.5–15.5)
WBC: 9.9 10*3/uL (ref 4.0–10.5)
nRBC: 0 % (ref 0.0–0.2)

## 2018-04-11 LAB — BASIC METABOLIC PANEL
ANION GAP: 10 (ref 5–15)
BUN: 11 mg/dL (ref 8–23)
CALCIUM: 8.4 mg/dL — AB (ref 8.9–10.3)
CO2: 23 mmol/L (ref 22–32)
Chloride: 102 mmol/L (ref 98–111)
Creatinine, Ser: 0.96 mg/dL (ref 0.61–1.24)
GFR calc Af Amer: >60 mL/min (ref >60)
GFR calc non Af Amer: >60 mL/min (ref >60)
Glucose, Bld: 93 mg/dL (ref 70–99)
Potassium: 4 mmol/L (ref 3.5–5.1)
Sodium: 135 mmol/L (ref 135–145)

## 2018-04-11 MED ORDER — SODIUM CHLORIDE 0.9 % IV SOLN: INTRAVENOUS | Status: DC

## 2018-04-11 MED ORDER — PEG 3350-KCL-NA BICARB-NACL 420 G PO SOLR
4000.0000 mL | Freq: Once | ORAL | Status: AC
  Administered 2018-04-12: 4000 mL via ORAL
  Filled 2018-04-11: qty 4000

## 2018-04-11 NOTE — Progress Notes (Signed)
[facility]  PROGRESS NOTE  Logan Phillips RJJ:[id number] DOB: Jul 25, 1950 DOA: 04/09/2018 PCP: Carlena Hurl, PA-C   Brief HPI:   68 year old male with a history of heavy alcohol use, chronic shoulder pain, hypertension, GERD came to ED with complaints of blood per rectum.  Patient had bleeding last night and had 4-5 episodes of bright red blood with clots.  In the ED CT scan showed ischemic colitis.  Hemoglobin was 15.   Subjective   Patient seen and examined, feels better this morning. No abdominal pain.    Assessment/Plan:    1. Ischemic colitis- GI has seen and deemed that patient has ischemic colitis.  Will discontinue antibiotics at this time.  Start clear liquid diet.  No colonoscopy in the hospital as patient does not want to take prep at this time. Colonoscopy tomorrow per GI  2. Alcohol abuse-continue CIWA protocol, no signs or symptoms of alcohol withdrawal.  3. Hypertension-continue home medications, lisinopril, metoprolol.  4. GERD-continue PPI   CBC: Recent Labs  Lab 04/09/18 1221 04/10/18 0446 04/11/18 0602  WBC 12.1* 10.7* 9.9  HGB 15.7 14.2 13.6  HCT 44.7 40.1 37.8*  MCV 89.4 89.5 89.2  PLT 153 134* 122*    Basic Metabolic Panel: Recent Labs  Lab 04/09/18 1221 04/10/18 0446 04/11/18 0602  NA 135 135 135  K 4.5 4.1 4.0  CL 98 102 102  CO2 22 23 23   GLUCOSE 133* 110* 93  BUN 19 14 11   CREATININE 1.20 1.10 0.96  CALCIUM 9.2 8.4* 8.4*     DVT prophylaxis: SCDs  Code Status: Full code  Family Communication: No family at bedside  Disposition Plan: likely home when medically ready for discharge     Consultants:  Gastroenterology  Procedures:     Antibiotics:   Anti-infectives (From admission, onward)   Start     Dose/Rate Route Frequency Ordered Stop   04/09/18 1800  ceFEPIme (MAXIPIME) 1 g in sodium chloride 0.9 % 100 mL IVPB     1 g 200 mL/hr over 30 Minutes Intravenous Every 8 hours 04/09/18 1741     04/09/18  1715  metroNIDAZOLE (FLAGYL) IVPB 500 mg     500 mg 100 mL/hr over 60 Minutes Intravenous Every 8 hours 04/09/18 1703         Objective   Vitals:   04/10/18 1742 04/10/18 2018 04/11/18 0504 04/11/18 0924  BP: (!) 140/93 (!) 145/79 (!) 153/91 (!) 141/92  Pulse: (!) 57 (!) 59 (!) 58 (!) 59  Resp: 18 (!) 21 20 18   Temp:  98.5 F (36.9 C) 98.5 F (36.9 C) 98.9 F (37.2 C)  TempSrc:  Oral Oral Oral  SpO2: 98% 99% 98% 98%  Weight:  77.4 kg    Height:        Intake/Output Summary (Last 24 hours) at 04/11/2018 1507 Last data filed at 04/10/2018 2201 Gross per 24 hour  Intake 1464.28 ml  Output -  Net 1464.28 ml   Filed Weights   04/09/18 1802 04/10/18 2018  Weight: 77.3 kg 77.4 kg     Physical Examination:   General:  Appears in no acute distress  Cardiovascular: S1S2 RRR  Respiratory: Clear bilaterally, no wheezing   Abdomen: Soft, nontender, no organomegaly  Extremities: No edema of lower extremities  Neurologic:  AO x 3, no focal defecit   Data Reviewed: I have personally reviewed following labs and imaging studies   No results found for this or any previous visit (from the  past 240 hour(s)).   Liver Function Tests: Recent Labs  Lab 04/09/18 1221  AST 36  ALT 30  ALKPHOS 44  BILITOT 1.3*  PROT 7.4  ALBUMIN 4.2      Studies: No results found.  Scheduled Meds: . folic acid  1 mg Oral Daily  . lisinopril  20 mg Oral Daily  . magnesium oxide  400 mg Oral BID  . metoprolol tartrate  100 mg Oral BID  . multivitamin with minerals  1 tablet Oral Daily  . pantoprazole  40 mg Oral Daily  . pravastatin  20 mg Oral QPM  . sodium chloride flush  3 mL Intravenous Q12H  . sodium chloride flush  3 mL Intravenous Q12H  . thiamine  100 mg Oral Daily   Or  . thiamine  100 mg Intravenous Daily    Admission status: Inpatient: Based on patients clinical presentation and evaluation of above clinical data, I have made determination that patient meets  Inpatient criteria at this time.  Time spent: 20 min  [facility] Pager [phone]. If 7PM-7AM, please contact night-coverage at [email or web address], Office  [phone]  password [email or web address]  04/11/2018, 3:07 PM  LOS: 2 days

## 2018-04-11 NOTE — Progress Notes (Signed)
Subjective: Feeling better.  No further bleeding and pain is improving.  Objective: Vital signs in last 24 hours: Temp:  [98.5 F (36.9 C)] 98.5 F (36.9 C) (01/28 0504) Pulse Rate:  [57-60] 58 (01/28 0504) Resp:  [18-21] 20 (01/28 0504) BP: (140-158)/(79-103) 153/91 (01/28 0504) SpO2:  [96 %-99 %] 98 % (01/28 0504) Weight:  [77.4 kg] 77.4 kg (01/27 2018) Last BM Date: 04/10/18  Intake/Output from previous day: 01/27 0701 - 01/28 0700 In: 2732.1 [P.O.:300; I.V.:2032.1; IV Piggyback:400.1] Out: -  Intake/Output this shift: No intake/output data recorded.  General appearance: alert and no distress GI: soft, non-tender; bowel sounds normal; no masses,  no organomegaly  Lab Results: Recent Labs    04/09/18 1221 04/10/18 0446 04/11/18 0602  WBC 12.1* 10.7* 9.9  HGB 15.7 14.2 13.6  HCT 44.7 40.1 37.8*  PLT 153 134* 122*   BMET Recent Labs    04/09/18 1221 04/10/18 0446 04/11/18 0602  NA 135 135 135  K 4.5 4.1 4.0  CL 98 102 102  CO2 22 23 23   GLUCOSE 133* 110* 93  BUN 19 14 11   CREATININE 1.20 1.10 0.96  CALCIUM 9.2 8.4* 8.4*   LFT Recent Labs    04/09/18 1221  PROT 7.4  ALBUMIN 4.2  AST 36  ALT 30  ALKPHOS 44  BILITOT 1.3*   PT/INR No results for input(s): LABPROT, INR in the last 72 hours. Hepatitis Panel No results for input(s): HEPBSAG, HCVAB, HEPAIGM, HEPBIGM in the last 72 hours. C-Diff No results for input(s): CDIFFTOX in the last 72 hours. Fecal Lactopherrin No results for input(s): FECLLACTOFRN in the last 72 hours.  Studies/Results: Ct Abdomen Pelvis W Contrast  Result Date: 04/09/2018 CLINICAL DATA:  Rectal bleeding beginning last night. Lower abdominal pain. EXAM: CT ABDOMEN AND PELVIS WITH CONTRAST TECHNIQUE: Multidetector CT imaging of the abdomen and pelvis was performed using the standard protocol following bolus administration of intravenous contrast. CONTRAST:  127mL OMNIPAQUE IOHEXOL 300 MG/ML  SOLN COMPARISON:  None. FINDINGS:  Lower chest: Normal Hepatobiliary: Mild fatty change of the liver. Two subcentimeter cysts. Gallbladder appears normal by CT. Pancreas: Normal Spleen: Normal Adrenals/Urinary Tract: Adrenal glands are normal. Kidneys are normal except for an insignificant 1 cm cyst on the right. No mass, stone or hydronephrosis. Bladder is normal. Stomach/Bowel: Segmental colitis beginning at the splenic flexure and extending to the distal descending colon. This distribution is concerning for ischemic insult, but inflammatory bowel disease and infectious colitis are not excluded. No sign of perforation or abscess. Small bowel appears normal. Remainder of the colon is normal. Vascular/Lymphatic: Atherosclerosis of the aorta but without aneurysm or dissection. IVC is normal. No retroperitoneal adenopathy. Reproductive: Normal Other: No free fluid or air. Musculoskeletal: Ordinary lower lumbar degenerative changes. IMPRESSION: Segmental colitis beginning at the splenic flexure and extending to the distal descending colon. This distribution is concerning for ischemic insult, but inflammatory bowel disease and infectious colitis are not excluded. No perforation or abscess. Electronically Signed   By: Nelson Chimes M.D.   On: 04/09/2018 14:12    Medications:  Scheduled: . folic acid  1 mg Oral Daily  . lisinopril  20 mg Oral Daily  . LORazepam  0-4 mg Oral Q6H   Followed by  . LORazepam  0-4 mg Oral Q12H  . magnesium oxide  400 mg Oral BID  . metoprolol tartrate  100 mg Oral BID  . multivitamin with minerals  1 tablet Oral Daily  . pantoprazole  40 mg Oral  Daily  . pravastatin  20 mg Oral QPM  . sodium chloride flush  3 mL Intravenous Q12H  . sodium chloride flush  3 mL Intravenous Q12H  . thiamine  100 mg Oral Daily   Or  . thiamine  100 mg Intravenous Daily   Continuous: . sodium chloride    . 0.9 % NaCl with KCl 20 mEq / L 125 mL/hr at 04/11/18 0741  . ceFEPime (MAXIPIME) IV 1 g (04/11/18 0324)  . metronidazole  500 mg (04/11/18 0101)    Assessment/Plan: 1) Ischemic colitis. 2) Hematochezia.   Clinically he is much better.  There is much less tenderness in his abdomen.  He desires to pursue a colonoscopy as an inpatient.  Plan: 1) Colonoscopy tomorrow with Dr. Collene Mares. 2) Continue supportive care.  LOS: 2 days   Sammi Stolarz D 04/11/2018, 7:44 AM

## 2018-04-12 ENCOUNTER — Inpatient Hospital Stay (HOSPITAL_COMMUNITY): Payer: Medicare HMO

## 2018-04-12 ENCOUNTER — Encounter (HOSPITAL_COMMUNITY): Payer: Self-pay | Admitting: Certified Registered Nurse Anesthetist

## 2018-04-12 ENCOUNTER — Inpatient Hospital Stay (HOSPITAL_COMMUNITY): Payer: Medicare HMO | Admitting: Certified Registered Nurse Anesthetist

## 2018-04-12 ENCOUNTER — Encounter (HOSPITAL_COMMUNITY)
Admission: EM | Disposition: A | Discharge status: Home / Self Care | Payer: Self-pay | Source: Home / Self Care | Attending: Internal Medicine

## 2018-04-12 DIAGNOSIS — R42 Dizziness and giddiness: Secondary | ICD-10-CM

## 2018-04-12 HISTORY — PX: COLONOSCOPY WITH PROPOFOL: SHX5780

## 2018-04-12 HISTORY — PX: BIOPSY: SHX5522

## 2018-04-12 SURGERY — COLONOSCOPY WITH PROPOFOL
Anesthesia: Monitor Anesthesia Care

## 2018-04-12 MED ORDER — LACTATED RINGERS IV SOLN
INTRAVENOUS | Status: DC
  Administered 2018-04-12: 15:00:00 via INTRAVENOUS

## 2018-04-12 MED ORDER — PROPOFOL 10 MG/ML IV BOLUS
INTRAVENOUS | Status: DC | PRN
  Administered 2018-04-12 (×4): 20 mg via INTRAVENOUS

## 2018-04-12 MED ORDER — PROPOFOL 500 MG/50ML IV EMUL
INTRAVENOUS | Status: DC | PRN
  Administered 2018-04-12: 120 ug/kg/min via INTRAVENOUS

## 2018-04-12 MED ORDER — GADOBUTROL 1 MMOL/ML IV SOLN
7.5000 mL | Freq: Once | INTRAVENOUS | Status: AC | PRN
  Administered 2018-04-12: 7.5 mL via INTRAVENOUS

## 2018-04-12 SURGICAL SUPPLY — 22 items

## 2018-04-12 NOTE — Op Note (Signed)
[facility] Patient Name: Logan Phillips Procedure Date : 04/12/2018 MRN: [id number] Attending MD: Juanita Craver , MD Date of Birth: April 05, 1950 CSN: [id number] Age: 68 Admit Type: Inpatient Procedure:                Colonoscopy with cold biopsies. Indications:              Rectal bleeding; CRC screening for colorectal                            malignant neoplasm. Providers:                Juanita Craver, MD, Dorise Hiss, RN, Elna Breslow,                            RN, Tinnie Gens, Technician, Lavona Mound, CRNA Referring MD:             THP Medicines:                Monitored Anesthesia Care Complications:            No immediate complications. Estimated Blood Loss:     Estimated blood loss was minimal. Procedure:                Pre-Anesthesia Assessment: - Prior to the                            procedure, a history and physical was performed,                            and patient medications and allergies were                            reviewed. The patient's tolerance of previous                            anesthesia was also reviewed. The risks and                            benefits of the procedure and the sedation options                            and risks were discussed with the patient. All                            questions were answered, and informed consent was                            obtained. Prior Anticoagulants: The patient has                            taken no previous anticoagulant or antiplatelet                            agents. ASA Grade Assessment: III - A patient with  severe systemic disease. After reviewing the risks                            and benefits, the patient was deemed in                            satisfactory condition to undergo the procedure.                            After obtaining informed consent, the colonoscope                            was passed under direct vision. Throughout the                        procedure, the patient's blood pressure, pulse, and                            oxygen saturations were monitored continuously. The                            CF-HQ190L ([id number] Olympus colonoscope was                            introduced through the anus and advanced to the the                            cecum, identified by appendiceal orifice and                            ileocecal valve. The colonoscopy was performed with                            moderate difficulty due to inadequate bowel prep.                            Successful completion of the procedure was aided by                            lavage. The patient tolerated the procedure well.                            The quality of the bowel preparation was adequate.                            The ileocecal valve, the appendiceal orifice and                            the rectum were photographed. The bowel preparation                            used was GoLYTELY. Scope In: 4:12:41 PM Scope Out: 4:29:32 PM Scope Withdrawal Time: 0 hours 9 minutes 50 seconds  Total Procedure Duration: 0 hours 16 minutes  51 seconds  Findings:      An area of significantly erythematous, congested, ulcerated mucosa was       found in the sigmoid colon from 30-60 cm-biopsies done--?ischemic colits.      A few small-mouthed diverticula were found in the sigmoid colon.      The exam was otherwise without abnormality on direct and retroflexion       views. Impression:               - Congested, erythematous, ulcerated mucosa in the                            sigmoid colon from 30-60 cm-biopsies done-                            ?ischemic colitis.                           - Few scattered diverticula in the sigmoid colon.                           - The examination was otherwise normal on direct                            and retroflexion views. Recommendation:           - High fiber diet with liberal fluid intake.                            - Continue present medications; avoid all NSAIDS                            for now.                           - Await pathology results.                           - Repeat colonoscopy in 10 years for surveillance.                           - Return to GI office PRN.                           - If the patient has any abnormal GI symptoms in                            the interim, he has been advised to call the office                            ASAP for further recommendations. Procedure Code(s):        --- Professional ---                           [phone], Colonoscopy, flexible; with biopsy, single  or multiple Diagnosis Code(s):        --- Professional ---                           K62.5, Hemorrhage of anus and rectum                           K57.30, Diverticulosis of large intestine without                            perforation or abscess without bleeding                           Z12.11, Encounter for screening for malignant                            neoplasm of colon CPT copyright 2018 [employer]. All rights reserved. The codes documented in this report are preliminary and upon coder review may  be revised to meet current compliance requirements. Juanita Craver, MD Juanita Craver, MD 04/12/2018 4:49:44 PM This report has been signed electronically. Number of Addenda: 0

## 2018-04-12 NOTE — Care Management Important Message (Signed)
Important Message  Patient Details  Name: Logan Phillips MRN: [id number] Date of Birth: 11-Jun-1950   Medicare Important Message Given:  Yes    [name] 04/12/2018, 4:15 PM

## 2018-04-12 NOTE — Progress Notes (Signed)
Pt is in colonoscopy, and will try again at another time.     04/12/18 1400  PT Visit Information  Last PT Received On 04/12/18  Reason Eval/Treat Not Completed Patient at procedure or test/unavailable    Mee Hives, PT MS Acute Rehab Dept. Number: [place] [facility] [place]

## 2018-04-12 NOTE — Anesthesia Preprocedure Evaluation (Signed)
Anesthesia Evaluation    Reviewed: Allergy & Precautions, Patient's Chart, lab work & pertinent test results  History of Anesthesia Complications Negative for: history of anesthetic complications  Airway Mallampati: II  TM Distance: >3 FB Neck ROM: Full    Dental no notable dental hx.    Pulmonary neg pulmonary ROS,    Pulmonary exam normal        Cardiovascular hypertension, Normal cardiovascular exam     Neuro/Psych negative neurological ROS  negative psych ROS   GI/Hepatic GERD  ,(+)     substance abuse  alcohol use,   Endo/Other  negative endocrine ROS  Renal/GU negative Renal ROS  negative genitourinary   Musculoskeletal negative musculoskeletal ROS (+)   Abdominal   Peds  Hematology negative hematology ROS (+)   Anesthesia Other Findings 68 yo M with BRBPR for colonoscopy - PMH: alcohol abuse, HTN, GERD  Reproductive/Obstetrics                             Anesthesia Physical Anesthesia Plan  ASA: III  Anesthesia Plan: MAC   Post-op Pain Management:    Induction:   PONV Risk Score and Plan: 1 and Propofol infusion, TIVA and Treatment may vary due to age or medical condition  Airway Management Planned: Nasal Cannula, Simple Face Mask and Natural Airway  Additional Equipment: None  Intra-op Plan:   Post-operative Plan:   Informed Consent: I have reviewed the patients History and Physical, chart, labs and discussed the procedure including the risks, benefits and alternatives for the proposed anesthesia with the patient or authorized representative who has indicated his/her understanding and acceptance.       Plan Discussed with:   Anesthesia Plan Comments:         Anesthesia Quick Evaluation

## 2018-04-12 NOTE — Progress Notes (Signed)
[facility] PROGRESS NOTE    Progress Note  Logan Phillips  JJO:[id number] DOB: [birth year] DOA: 04/09/2018 PCP: Carlena Hurl, PA-C     Brief Narrative:   Logan Phillips is an 68 y.o. male with a history of heavy alcohol abuse, chronic shoulder pain, hypertension comes to the ED complaining of bright red blood per rectum  Assessment/Plan:   Acute ischemic colitis ([place]) Antibiotics discontinued on 04/11/2018 He was started on a clear liquid diet. GI was consulted recommended colonoscopy on 04/12/2018. Hemoglobin is stable.  Essential hypertension Continue current regimen no changes made blood pressure is Phillips controlled.  Alcohol consumption heavy Monitor with Ativan protocol no signs of withdrawal.  Dizziness: CT scan of the head on 04/11/2018 showed Right CP angle mass lesion measuring up to 2.4 cm. The lesion is probably a extra-axial schwannoma or meningioma, however, and intra-axial lesion is not excluded with mass-effect on the fourth ventricle and cerebellum will check an MRI of the brain.  DVT prophylaxis: heparin Family Communication:none Disposition Plan/Barrier to D/C: unable to determine Code Status:     Code Status Orders  (From admission, onward)         Start     Ordered   04/09/18 1712  Full code  Continuous     04/09/18 1714        Code Status History    This patient has a current code status but no historical code status.        IV Access:    Peripheral IV   Procedures and diagnostic studies:   Ct Head Wo Contrast  Result Date: 04/11/2018 CLINICAL DATA:  68 y/o  M; hypertension, ataxia, stroke suspected EXAM: CT HEAD WITHOUT CONTRAST TECHNIQUE: Contiguous axial images were obtained from the base of the skull through the vertex without intravenous contrast. COMPARISON:  None. FINDINGS: Brain: Right CP angle mass lesion measuring up to 2.4 cm. There is mass effect on the right cerebellum and fourth ventricle. Lateral and third  ventriculomegaly may reflect associate obstructive hydrocephalus. No additional findings for stroke, hemorrhage, mass effect, extra-axial collection, or herniation. Vascular: No hyperdense vessel or unexpected calcification. Skull: Normal. Negative for fracture or focal lesion. Sinuses/Orbits: Bilateral maxillary sinus opacification with high attenuation inspissated secretions. Mucosal thickening of the ethmoid air cells and the left sphenoid sinus. Normal aeration of mastoid air cells. Orbits are unremarkable. Other: 15 mm dermal cyst within the right parietal scalp. IMPRESSION: 1. Right CP angle mass lesion measuring up to 2.4 cm. The lesion is probably a extra-axial schwannoma or meningioma, however, and intra-axial lesion is not excluded. Further evaluation with MRI of the head with and without contrast is recommended. 2. Mass effect effect on the right cerebellum and fourth ventricle. Lateral and third ventriculomegaly may reflect associated obstructive hydrocephalus. 3. Bilateral maxillary sinus opacification. These results will be called to the ordering clinician or representative by the Radiologist Assistant, and communication documented in the PACS or zVision Dashboard. Electronically Signed   By: Kristine Garbe M.D.   On: 04/11/2018 22:52     Medical Consultants:    None.  Anti-Infectives:   None  Subjective:    Logan Phillips he relates dizziness all the time.  Objective:    Vitals:   04/11/18 1944 04/11/18 1946 04/12/18 0401 04/12/18 0922  BP:  (!) 161/99 (!) 163/81 (!) 164/93  Pulse:  (!) 52 (!) 57 (!) 54  Resp:  16 18 18   Temp:  98.9 F (37.2 C) 99 F (37.2  C) 97.9 F (36.6 C)  TempSrc:  Oral Oral Oral  SpO2:  99% 100% 99%  Weight: 79 kg     Height:        Intake/Output Summary (Last 24 hours) at 04/12/2018 1109 Last data filed at 04/12/2018 0900 Gross per 24 hour  Intake 4202.48 ml  Output 375 ml  Net 3827.48 ml   Filed Weights   04/09/18 1802  04/10/18 2018 04/11/18 1944  Weight: 77.3 kg 77.4 kg 79 kg    Exam: General exam: In no acute distress. Respiratory system: Good air movement and clear to auscultation. Cardiovascular system: S1 & S2 heard, RRR.  Gastrointestinal system: Abdomen is nondistended, soft and nontender.  Central nervous system: Alert and oriented.  Finger-to-nose is abnormal Extremities: No pedal edema. Skin: No rashes, lesions or ulcers Psychiatry: Judgement and insight appear normal. Mood & affect appropriate.    Data Reviewed:    Labs: Basic Metabolic Panel: Recent Labs  Lab 04/09/18 1221 04/10/18 0446 04/11/18 0602  NA 135 135 135  K 4.5 4.1 4.0  CL 98 102 102  CO2 22 23 23   GLUCOSE 133* 110* 93  BUN 19 14 11   CREATININE 1.20 1.10 0.96  CALCIUM 9.2 8.4* 8.4*   GFR Estimated Creatinine Clearance: 70.8 mL/min (by C-G formula based on SCr of 0.96 mg/dL). Liver Function Tests: Recent Labs  Lab 04/09/18 1221  AST 36  ALT 30  ALKPHOS 44  BILITOT 1.3*  PROT 7.4  ALBUMIN 4.2   No results for input(s): LIPASE, AMYLASE in the last 168 hours. No results for input(s): AMMONIA in the last 168 hours. Coagulation profile No results for input(s): INR, PROTIME in the last 168 hours.  CBC: Recent Labs  Lab 04/09/18 1221 04/10/18 0446 04/11/18 0602  WBC 12.1* 10.7* 9.9  HGB 15.7 14.2 13.6  HCT 44.7 40.1 37.8*  MCV 89.4 89.5 89.2  PLT 153 134* 122*   Cardiac Enzymes: No results for input(s): CKTOTAL, CKMB, CKMBINDEX, TROPONINI in the last 168 hours. BNP (last 3 results) No results for input(s): PROBNP in the last 8760 hours. CBG: No results for input(s): GLUCAP in the last 168 hours. D-Dimer: No results for input(s): DDIMER in the last 72 hours. Hgb A1c: No results for input(s): HGBA1C in the last 72 hours. Lipid Profile: No results for input(s): CHOL, HDL, LDLCALC, TRIG, CHOLHDL, LDLDIRECT in the last 72 hours. Thyroid function studies: No results for input(s): TSH, T4TOTAL,  T3FREE, THYROIDAB in the last 72 hours.  Invalid input(s): FREET3 Anemia work up: No results for input(s): VITAMINB12, FOLATE, FERRITIN, TIBC, IRON, RETICCTPCT in the last 72 hours. Sepsis Labs: Recent Labs  Lab 04/09/18 1221 04/10/18 0446 04/11/18 0602  WBC 12.1* 10.7* 9.9   Microbiology No results found for this or any previous visit (from the past 240 hour(s)).   Medications:   . folic acid  1 mg Oral Daily  . lisinopril  20 mg Oral Daily  . magnesium oxide  400 mg Oral BID  . metoprolol tartrate  100 mg Oral BID  . multivitamin with minerals  1 tablet Oral Daily  . pantoprazole  40 mg Oral Daily  . pravastatin  20 mg Oral QPM  . sodium chloride flush  3 mL Intravenous Q12H  . sodium chloride flush  3 mL Intravenous Q12H  . thiamine  100 mg Oral Daily   Or  . thiamine  100 mg Intravenous Daily   Continuous Infusions: . sodium chloride    . sodium  chloride    . 0.9 % NaCl with KCl 20 mEq / L 125 mL/hr at 04/12/18 0909  . ceFEPime (MAXIPIME) IV 1 g (04/12/18 0529)  . metronidazole 500 mg (04/12/18 0910)      LOS: 3 days   Charlynne Cousins  [facility]  04/12/2018, 11:09 AM

## 2018-04-12 NOTE — Transfer of Care (Signed)
Immediate Anesthesia Transfer of Care Note  Patient: Logan Phillips  Procedure(s) Performed: COLONOSCOPY WITH PROPOFOL (N/A ) BIOPSY  Patient Location: PACU  Anesthesia Type:MAC  Level of Consciousness: drowsy and responds to stimulation  Airway & Oxygen Therapy: Patient Spontanous Breathing  Post-op Assessment: Report given to RN and Post -op Vital signs reviewed and stable  Post vital signs: Reviewed and stable  Last Vitals:  Vitals Value Taken Time  BP 160/94 04/12/2018  4:33 PM  Temp    Pulse 73 04/12/2018  4:33 PM  Resp 25 04/12/2018  4:33 PM  SpO2 100 % 04/12/2018  4:33 PM    Last Pain:  Vitals:   04/12/18 1633  TempSrc:   PainSc: 0-No pain      Patients Stated Pain Goal: 0 (02/54/27 0623)  Complications: No apparent anesthesia complications

## 2018-04-13 NOTE — Progress Notes (Signed)
[facility] PROGRESS NOTE    Progress Note  Logan Phillips  QQI:[id number] DOB: [birth year] DOA: 04/09/2018 PCP: Carlena Hurl, PA-C     Brief Narrative:   Logan Phillips is an 68 y.o. male with a history of heavy alcohol abuse, chronic shoulder pain, hypertension comes to the ED complaining of bright red blood per rectum  Assessment/Plan:   Acute ischemic colitis ([place]) Antibiotics discontinued on 04/11/2018 Tolerating his clear liquid diet. GI was consulted colonoscopy in 04/12/2018 show ischemic colitis  Essential hypertension Continue current regimen no changes made blood pressure is Phillips controlled.  Alcohol consumption heavy Monitor with Ativan protocol no signs of withdrawal.  Dizziness: CT scan of the head on 04/11/2018 showed Right CP angle mass lesion measuring up to 2.4 cm. The lesion is probably a extra-axial schwannoma. MRI of the brain done on 04/12/2018 showed Primarily extra canalicular vestibular schwannoma the within the CP angle cistern region on the right measuring 2.6 x 2.8 x 2.6 cm, with mass-effect upon the pons and right middle cerebellar peduncle. We will consult neurosurgery for recommendations.  DVT prophylaxis: heparin Family Communication:none Disposition Plan/Barrier to D/C: unable to determine Code Status:     Code Status Orders  (From admission, onward)         Start     Ordered   04/09/18 1712  Full code  Continuous     04/09/18 1714        Code Status History    This patient has a current code status but no historical code status.        IV Access:    Peripheral IV   Procedures and diagnostic studies:   Ct Head Wo Contrast  Result Date: 04/11/2018 CLINICAL DATA:  68 y/o  M; hypertension, ataxia, stroke suspected EXAM: CT HEAD WITHOUT CONTRAST TECHNIQUE: Contiguous axial images were obtained from the base of the skull through the vertex without intravenous contrast. COMPARISON:  None. FINDINGS: Brain: Right CP  angle mass lesion measuring up to 2.4 cm. There is mass effect on the right cerebellum and fourth ventricle. Lateral and third ventriculomegaly may reflect associate obstructive hydrocephalus. No additional findings for stroke, hemorrhage, mass effect, extra-axial collection, or herniation. Vascular: No hyperdense vessel or unexpected calcification. Skull: Normal. Negative for fracture or focal lesion. Sinuses/Orbits: Bilateral maxillary sinus opacification with high attenuation inspissated secretions. Mucosal thickening of the ethmoid air cells and the left sphenoid sinus. Normal aeration of mastoid air cells. Orbits are unremarkable. Other: 15 mm dermal cyst within the right parietal scalp. IMPRESSION: 1. Right CP angle mass lesion measuring up to 2.4 cm. The lesion is probably a extra-axial schwannoma or meningioma, however, and intra-axial lesion is not excluded. Further evaluation with MRI of the head with and without contrast is recommended. 2. Mass effect effect on the right cerebellum and fourth ventricle. Lateral and third ventriculomegaly may reflect associated obstructive hydrocephalus. 3. Bilateral maxillary sinus opacification. These results will be called to the ordering clinician or representative by the Radiologist Assistant, and communication documented in the PACS or zVision Dashboard. Electronically Signed   By: Kristine Garbe M.D.   On: 04/11/2018 22:52   Mr Jeri Cos HE Contrast  Result Date: 04/12/2018 CLINICAL DATA:  Posterior fossa mass suggested by CT. EXAM: MRI HEAD WITHOUT AND WITH CONTRAST TECHNIQUE: Multiplanar, multiecho pulse sequences of the brain and surrounding structures were obtained without and with intravenous contrast. CONTRAST:  7.5 cc Gadavist COMPARISON:  Head CT same day.  Cervical MRI 05/19/2011. FINDINGS:  Brain: Diffusion imaging does not show any acute or subacute infarction. There is mild generalized atrophy with slight ventricular prominence. Ventricular  prominence is felt secondary to central atrophy, though early stage normal pressure hydrocephalus is not excluded. The patient has a large vestibular schwannoma with dominant extra canalicular component measuring 2.6 x 2.8 x 2.6 cm. Small intracanalicular component extends into the internal auditory canal. Mild internal cystic change. Mass-effect upon the brainstem and right cerebellar peduncle. The differential diagnosis does include meningioma but I think that is much less likely. No extra-axial fluid collection. Vascular: Major vessels at the base of the brain show flow. Skull and upper cervical spine: Negative Sinuses/Orbits: Bilateral maxillary sinusitis and fluid opacification. Mild inflammatory changes in the anterior ethmoids. Other sinuses are clear. Other: None IMPRESSION: Primarily extra canalicular vestibular schwannoma the within the CP angle cistern region on the right measuring 2.6 x 2.8 x 2.6 cm, with mass-effect upon the pons and right middle cerebellar peduncle. See above for full discussion. Ventricular prominence, somewhat out of proportion to the sulci. This may simply be secondary to central atrophy, but normal pressure hydrocephalus is not excluded by imaging. There is no visible cause of obstructive hydrocephalus. Fourth ventricle and cerebral aqueduct appear sufficiently patent. Electronically Signed   By: Nelson Chimes M.D.   On: 04/12/2018 13:38     Medical Consultants:    None.  Anti-Infectives:   None  Subjective:    Logan Phillips he relates dizziness all the time.  Objective:    Vitals:   04/12/18 1701 04/12/18 2126 04/13/18 0453 04/13/18 0911  BP: (!) 177/95 (!) 159/90 136/84 (!) 153/90  Pulse: (!) 55 (!) 57 (!) 51 (!) 55  Resp: 18 18 18 18   Temp: 98.2 F (36.8 C) 98 F (36.7 C) 98.4 F (36.9 C) 98.4 F (36.9 C)  TempSrc: Oral Oral  Oral  SpO2: 100% 99% 99% 100%  Weight:  79.4 kg    Height:        Intake/Output Summary (Last 24 hours) at 04/13/2018  1103 Last data filed at 04/13/2018 1000 Gross per 24 hour  Intake 1433 ml  Output 5 ml  Net 1428 ml   Filed Weights   04/11/18 1944 04/12/18 1427 04/12/18 2126  Weight: 79 kg 79.4 kg 79.4 kg    Exam: General exam: In no acute distress. Respiratory system: Good air movement and clear to auscultation. Cardiovascular system: S1 & S2 heard, RRR.  Gastrointestinal system: Abdomen is nondistended, soft and nontender.  Central nervous system: Alert and oriented.  Finger-to-nose is abnormal Extremities: No pedal edema. Skin: No rashes, lesions or ulcers Psychiatry: Judgement and insight appear normal. Mood & affect appropriate.    Data Reviewed:    Labs: Basic Metabolic Panel: Recent Labs  Lab 04/09/18 1221 04/10/18 0446 04/11/18 0602  NA 135 135 135  K 4.5 4.1 4.0  CL 98 102 102  CO2 22 23 23   GLUCOSE 133* 110* 93  BUN 19 14 11   CREATININE 1.20 1.10 0.96  CALCIUM 9.2 8.4* 8.4*   GFR Estimated Creatinine Clearance: 76.4 mL/min (by C-G formula based on SCr of 0.96 mg/dL). Liver Function Tests: Recent Labs  Lab 04/09/18 1221  AST 36  ALT 30  ALKPHOS 44  BILITOT 1.3*  PROT 7.4  ALBUMIN 4.2   No results for input(s): LIPASE, AMYLASE in the last 168 hours. No results for input(s): AMMONIA in the last 168 hours. Coagulation profile No results for input(s): INR, PROTIME in the last  168 hours.  CBC: Recent Labs  Lab 04/09/18 1221 04/10/18 0446 04/11/18 0602  WBC 12.1* 10.7* 9.9  HGB 15.7 14.2 13.6  HCT 44.7 40.1 37.8*  MCV 89.4 89.5 89.2  PLT 153 134* 122*   Cardiac Enzymes: No results for input(s): CKTOTAL, CKMB, CKMBINDEX, TROPONINI in the last 168 hours. BNP (last 3 results) No results for input(s): PROBNP in the last 8760 hours. CBG: No results for input(s): GLUCAP in the last 168 hours. D-Dimer: No results for input(s): DDIMER in the last 72 hours. Hgb A1c: No results for input(s): HGBA1C in the last 72 hours. Lipid Profile: No results for  input(s): CHOL, HDL, LDLCALC, TRIG, CHOLHDL, LDLDIRECT in the last 72 hours. Thyroid function studies: No results for input(s): TSH, T4TOTAL, T3FREE, THYROIDAB in the last 72 hours.  Invalid input(s): FREET3 Anemia work up: No results for input(s): VITAMINB12, FOLATE, FERRITIN, TIBC, IRON, RETICCTPCT in the last 72 hours. Sepsis Labs: Recent Labs  Lab 04/09/18 1221 04/10/18 0446 04/11/18 0602  WBC 12.1* 10.7* 9.9   Microbiology No results found for this or any previous visit (from the past 240 hour(s)).   Medications:   . folic acid  1 mg Oral Daily  . lisinopril  20 mg Oral Daily  . magnesium oxide  400 mg Oral BID  . metoprolol tartrate  100 mg Oral BID  . multivitamin with minerals  1 tablet Oral Daily  . pantoprazole  40 mg Oral Daily  . pravastatin  20 mg Oral QPM  . sodium chloride flush  3 mL Intravenous Q12H  . sodium chloride flush  3 mL Intravenous Q12H  . thiamine  100 mg Oral Daily   Or  . thiamine  100 mg Intravenous Daily   Continuous Infusions: . sodium chloride        LOS: 4 days   Charlynne Cousins  [facility]  04/13/2018, 11:03 AM

## 2018-04-13 NOTE — Evaluation (Signed)
Physical Therapy Evaluation Patient Details Name: Logan Phillips MRN: [id number] DOB: Nov 04, 1950 Today's Date: 04/13/2018   History of Present Illness  Logan Phillips is an 68 y.o. male with a history of heavy alcohol abuse, chronic shoulder pain, hypertension comes to the ED complaining of bright red blood per rectum.  pt found to have mass in brain - working it up.  Neuro ordered.  Clinical Impression  Pt with right hearing loss and history of 2-3 falls a week.  Pt did better with PT than I expected. He has equal strenght but poor habits with mobilty.  I will recommend Rw for now to help decrease his falls when no one with him.  I gave him some sit to stand exercises and he got better with repetition.  Pt would benefit from PT education on HEP and ways to safely work on his balance and improve his confidence. He seems motivated to not resume heavy drinking.  Daughter present for eval.    Follow Up Recommendations Outpatient PT;Supervision for mobility/OOB    Equipment Recommendations  Rolling walker with 5" wheels    Recommendations for Other Services       Precautions / Restrictions Precautions Precautions: Fall Precaution Comments: pt reports he has history of 2-3 falls a week. he is heavy drinker so put it off on this      Mobility  Bed Mobility Overal bed mobility: Independent                Transfers Overall transfer level: Needs assistance   Transfers: Sit to/from Stand Sit to Stand: Supervision         General transfer comment: pt stands and leans on the bed - he looks off balanced and reports he doesnt have good balance.  I had pt do 5 sit to stands - getting weight over feet quicker - he did much better and had much less fall risk with this technque. he demonstrated understanding  Ambulation/Gait Ambulation/Gait assistance: Min guard   Assistive device: None       General Gait Details: pt walks with short shuffling steps and forward flexed posture.  Pt  tends to keep weight on heels.  i had him march - he did very well.  pt walked with long strides, pt walked with increased heel strike. he was abel to do all the above  [employer] (Stroke Patients Only)       Balance                                 Standardized Balance Assessment Standardized Balance Assessment : Berg Balance Test Berg Balance Test Sit to Stand: Able to stand  independently using hands Standing Unsupported: Able to stand 2 minutes with supervision Sitting with Back Unsupported but Feet Supported on Floor or Stool: Able to sit safely and securely 2 minutes Stand to Sit: Controls descent by using hands Transfers: Able to transfer safely, definite need of hands Standing Unsupported with Eyes Closed: Able to stand 10 seconds safely Standing Ubsupported with Feet Together: Able to place feet together independently and stand for 1 minute with supervision From Standing, Reach Forward with Outstretched Arm: Can reach forward >12 cm safely (5") From Standing Position, Pick up Object from Floor: Able to pick up shoe, needs supervision From Standing Position, Turn to Look Behind  Over each Shoulder: Turn sideways only but maintains balance Turn 360 Degrees: Able to turn 360 degrees safely in 4 seconds or less Standing Unsupported, Alternately Place Feet on Step/Stool: Able to complete >2 steps/needs minimal assist Standing Unsupported, One Foot in Front: Able to take small step independently and hold 30 seconds Standing on One Leg: Able to lift leg independently and hold 5-10 seconds Total Score: 41         Pertinent Vitals/Pain Pain Assessment: 0-10 Pain Location: right shoulder - chronic pain Pain Intervention(s): Limited activity within patient's tolerance;Repositioned    Home Living Family/patient expects to be discharged to:: Private residence Living Arrangements: Non-relatives/Friends Available  Help at Discharge: Available PRN/intermittently             Additional Comments: pt plans to DC home with a friend (that he says is non drinker and good influence).  pt is not sure what the house looks like.  daughter is present and collaborates with the story    Prior Function           Comments: prior - pt lived in apartment and was heavy drinker. Pt plans to stop drinking     Hand Dominance        Extremity/Trunk Assessment        Lower Extremity Assessment Lower Extremity Assessment: Generalized weakness    Cervical / Trunk Assessment Cervical / Trunk Assessment: Normal(pt keeps shoulders shrugged and has forward flexion)  Communication   Communication: (pt is deaf in one ear - delayed in some response - maybe due to [name])  Cognition Arousal/Alertness: Awake/alert Behavior During Therapy: WFL for tasks assessed/performed Overall Cognitive Status: Within Functional Limits for tasks assessed                                 General Comments: Pt seems motivated to progress to healthier lifestyle      General Comments General comments (skin integrity, edema, etc.): Pt tested 41/56.  he was abel to show improvment quickly when educated on how to do a task. he has bad habits and poor confidence in his balance.      Exercises Other Exercises Other Exercises: pt did 5 sit to stands - from his bed.  first few he was leaning on bed and poor control. then cued to get weight over feet more quickly and much improved control with stand and with sitting.  Pt encouraged to do this for an exercise.   Assessment/Plan    PT Assessment Patient needs continued PT services  PT Problem List Decreased balance;Decreased activity tolerance;Decreased safety awareness;Decreased knowledge of use of DME       PT Treatment Interventions DME instruction;Functional mobility training;Balance training;Patient/family education;Gait training;Therapeutic activities;Therapeutic  exercise    PT Goals (Current goals can be found in the Care Plan section)  Acute Rehab PT Goals PT Goal Formulation: With patient/family Time For Goal Achievement: 04/21/18 Potential to Achieve Goals: Good    Frequency Min 3X/week   Barriers to discharge        Co-evaluation               AM-PAC PT "6 Clicks" Mobility  Outcome Measure Help needed turning from your back to your side while in a flat bed without using bedrails?: None Help needed moving from lying on your back to sitting on the side of a flat bed without using bedrails?: None Help needed moving to and from  a bed to a chair (including a wheelchair)?: A Little Help needed standing up from a chair using your arms (e.g., wheelchair or bedside chair)?: A Little Help needed to walk in hospital room?: A Little Help needed climbing 3-5 steps with a railing? : A Little 6 Click Score: 20    End of Session Equipment Utilized During Treatment: Gait belt Activity Tolerance: Patient tolerated treatment well;No increased pain Patient left: in bed;with family/visitor present;with call bell/phone within reach Nurse Communication: Mobility status PT Visit Diagnosis: Unsteadiness on feet (R26.81);Other abnormalities of gait and mobility (R26.89);Repeated falls (R29.6);Muscle weakness (generalized) (M62.81)    Time: 1335-1400 PT Time Calculation (min) (ACUTE ONLY): 25 min   Charges:   PT Evaluation $PT Eval Low Complexity: 1 Low PT Treatments $Gait Training: 23-37 mins        04/13/2018   Rande Lawman, PT   Loyal Buba 04/13/2018, 2:17 PM

## 2018-04-13 NOTE — Anesthesia Postprocedure Evaluation (Signed)
Anesthesia Post Note  Patient: Logan Phillips  Procedure(s) Performed: COLONOSCOPY WITH PROPOFOL (N/A ) BIOPSY     Patient location during evaluation: Endoscopy Anesthesia Type: MAC Level of consciousness: awake and alert Pain management: pain level controlled Vital Signs Assessment: post-procedure vital signs reviewed and stable Respiratory status: spontaneous breathing, nonlabored ventilation and respiratory function stable Cardiovascular status: blood pressure returned to baseline and stable Postop Assessment: no apparent nausea or vomiting Anesthetic complications: no    Last Vitals:  Vitals:   04/13/18 1405 04/13/18 1633  BP:  (!) 148/95  Pulse: 60 60  Resp:  18  Temp:  36.9 C  SpO2:  99%    Last Pain:  Vitals:   04/13/18 1633  TempSrc: Oral  PainSc:    Pain Goal: Patients Stated Pain Goal: 0 (04/10/18 0257)                 Lidia Collum

## 2018-04-13 NOTE — Progress Notes (Signed)
SPoke w patient. He declined OP PT and RW. Agreed to cane. It will be delivered to room prior to DC.

## 2018-04-13 NOTE — Consult Note (Signed)
Chief Complaint   Chief Complaint  Patient presents with  . Rectal Bleeding    HPI   Consult requested by: Dr Aileen Fass Reason for consult: brain mass  HPI: Logan Phillips is a 68 y.o. male who presented to the ER 04/09/2018 due to 1 day history of rectal bleeding. Recently underwent colonoscopy and diagnosed with ischemic colitis. Currently managed by TH. He was also complaining of gait instability so a CT head was ordered revealing mass ultimately leading to MRI confirming an extra canalicular mass within the CP cistern so NSY consultation requested.  Other than rectal bleeding, Logan Phillips has noticed difficulties with ambulation over the past 2 weeks. He describes walking straight and then gradually vearing towards the right or left. No weakness in legs/N/T. He also feels off balance when standing. He has fallen at least 4 times without significant injury due to this feeling. No issues with coordination or fine motor skulls. He has noted some mild HA as well for the past week, although not significant. Endorses blurry vision but has a history of cataracts. Has also noticed right hearing loss for the past several years (is supposed to have hearing aids?). Has a history of alcohol dependence - 5-6 12 ounce beers per day with several shots of hard liquor. No on blood thinning medications.  Patient Active Problem List   Diagnosis Date Noted  . Acute ischemic colitis ([place]) 04/09/2018  . Arthritis 02/22/2018  . Alcohol consumption heavy 02/22/2018  . Gastroesophageal reflux disease 02/22/2018  . Skin lesion 10/05/2017  . Ataxia 10/05/2017  . Dizziness 10/05/2017  . Memory change 10/05/2017  . Chronic right shoulder pain 03/18/2017  . Screening for diabetes mellitus 10/04/2016  . Vaccine counseling 10/04/2016  . Encounter for health maintenance examination in adult 10/04/2016  . Need for pneumococcal vaccination 10/04/2016  . Lipoma of torso 10/04/2016  . Hearing difficulty of both  ears 10/04/2016  . Varicose veins of left lower extremity   . Essential hypertension 07/08/2014  . Foot pain, left 04/14/2011    PMH: Past Medical History:  Diagnosis Date  . Chronic pain in shoulder 2018   [place]  . H/O echocardiogram 10/08/2016   EF 65-70%, normal wall motion, systolic function vigorous  . Hearing loss    pending hearing aids 06/2014  . Hypertension   . Lipoma   . Varicose vein     PSH: Past Surgical History:  Procedure Laterality Date  . APPENDECTOMY    . COLONOSCOPY     age 26, he did not f/u with referral 2018  . LUMBAR DISC SURGERY  2002   Dr. Rita Ohara  . WISDOM TOOTH EXTRACTION      Medications Prior to Admission  Medication Sig Dispense Refill Last Dose  . Calcium Carbonate-Vitamin D (CALCIUM 500 + D) 500-125 MG-UNIT TABS Take 1 tablet by mouth daily.    04/06/2018  . fish oil-omega-3 fatty acids 1000 MG capsule Take 1 g by mouth daily.   Past Month at Unknown time  . lisinopril (PRINIVIL,ZESTRIL) 20 MG tablet Take 1 tablet (20 mg total) by mouth daily. 90 tablet 3 04/09/2018 at Unknown time  . magnesium oxide (MAG-OX) 400 MG tablet Take 400 mg by mouth 2 (two) times daily.   04/08/2018 at Unknown time  . meloxicam (MOBIC) 15 MG tablet Take 1 tablet (15 mg total) by mouth daily. 30 tablet 2 Past Week at Unknown time  . metoprolol tartrate (LOPRESSOR) 100 MG tablet Take 1 tablet (  100 mg total) by mouth 2 (two) times daily. 180 tablet 3 04/09/2018 at 0800  . milk thistle 175 MG tablet Take 175 mg by mouth daily.   Past Month at Unknown time  . Multiple Vitamin (MULITIVITAMIN WITH MINERALS) TABS Take 1 tablet by mouth daily.   Past Month at Unknown time  . omeprazole (PRILOSEC) 40 MG capsule Take 1 capsule (40 mg total) by mouth daily. 30 capsule 2 Past Month at Unknown time  . Potassium 75 MG TABS Take 1 tablet by mouth daily.    Past Month at Unknown time  . pravastatin (PRAVACHOL) 20 MG tablet Take 1 tablet (20 mg total) by mouth every  evening. 90 tablet 0 Past Week at Unknown time  . SUPER B COMPLEX/C PO Take 1 tablet by mouth daily.    Past Month at Unknown time  . zinc gluconate 50 MG tablet Take 50 mg by mouth daily.   04/08/2018 at Unknown time    SH: Social History   Tobacco Use  . Smoking status: Never Smoker  . Smokeless tobacco: Never Used  Substance Use Topics  . Alcohol use: Yes    Alcohol/week: 32.0 standard drinks    Types: 30 Cans of beer, 2 Shots of liquor per week  . Drug use: No    MEDS: Prior to Admission medications   Medication Sig Start Date End Date Taking? Authorizing Provider  Calcium Carbonate-Vitamin D (CALCIUM 500 + D) 500-125 MG-UNIT TABS Take 1 tablet by mouth daily.    Yes [provider]  fish oil-omega-3 fatty acids 1000 MG capsule Take 1 g by mouth daily.   Yes [provider]  lisinopril (PRINIVIL,ZESTRIL) 20 MG tablet Take 1 tablet (20 mg total) by mouth daily. 10/06/17  Yes Tysinger, Camelia Eng, PA-C  magnesium oxide (MAG-OX) 400 MG tablet Take 400 mg by mouth 2 (two) times daily.   Yes [provider]  meloxicam (MOBIC) 15 MG tablet Take 1 tablet (15 mg total) by mouth daily. 11/16/17  Yes Tysinger, Camelia Eng, PA-C  metoprolol tartrate (LOPRESSOR) 100 MG tablet Take 1 tablet (100 mg total) by mouth 2 (two) times daily. 10/06/17  Yes Tysinger, Camelia Eng, PA-C  milk thistle 175 MG tablet Take 175 mg by mouth daily.   Yes [provider]  Multiple Vitamin (MULITIVITAMIN WITH MINERALS) TABS Take 1 tablet by mouth daily.   Yes [provider]  omeprazole (PRILOSEC) 40 MG capsule Take 1 capsule (40 mg total) by mouth daily. 02/22/18  Yes Tysinger, Camelia Eng, PA-C  Potassium 75 MG TABS Take 1 tablet by mouth daily.    Yes [provider]  pravastatin (PRAVACHOL) 20 MG tablet Take 1 tablet (20 mg total) by mouth every evening. 10/06/17 10/06/18 Yes Tysinger, Camelia Eng, PA-C  SUPER B COMPLEX/C PO Take 1 tablet by mouth daily.    Yes [provider]  zinc gluconate 50 MG tablet Take 50 mg by mouth daily.   Yes [provider]    ALLERGY: No Known Allergies  Social History   Tobacco Use  . Smoking status: Never Smoker  . Smokeless tobacco: Never Used  Substance Use Topics  . Alcohol use: Yes    Alcohol/week: 32.0 standard drinks    Types: 30 Cans of beer, 2 Shots of liquor per week     Family History  Problem Relation Age of Onset  . Hypertension Mother   . Dementia Father   . Parkinsonism Father   . Diabetes  Father        early stages  . Cancer Maternal Grandmother        breast  . Heart disease Neg Hx   . Stroke Neg Hx      ROS   Review of Systems  Constitutional: Negative for chills and fever.  HENT: Positive for hearing loss (right). Negative for ear discharge, ear pain and tinnitus.   Eyes: Positive for blurred vision. Negative for double vision, photophobia and pain.  Respiratory: Negative.   Genitourinary: Negative.   Musculoskeletal: Negative.   Neurological: Positive for dizziness (occasionally) and headaches. Negative for tingling, tremors, sensory change, speech change, focal weakness, seizures, loss of consciousness and weakness.    Exam   Vitals:   04/13/18 0911 04/13/18 1405  BP: (!) 153/90   Pulse: (!) 55 60  Resp: 18   Temp: 98.4 F (36.9 C)   SpO2: 100%    General appearance: WDWN, NAD, up ambulating in room with minimal difficulty Eyes: No scleral injection Cardiovascular: Regular rate and rhythm without murmurs, rubs, gallops. No edema or variciosities. Distal pulses normal. Pulmonary: Effort normal, non-labored breathing Musculoskeletal:     Muscle tone upper extremities: Normal    Muscle tone lower extremities: Normal    Motor exam: Upper Extremities Deltoid Bicep Tricep Grip  Right 5/5 5/5 5/5 5/5  Left 5/5 5/5 5/5 5/5   Lower Extremity IP Quad PF DF EHL  Right 5/5 5/5 5/5 5/5 5/5  Left 5/5 5/5 5/5 5/5 5/5   Neurological Mental Status:    -  Patient is awake, alert, oriented to person, place, month, year, and situation    - Patient is able to give a clear and coherent history.    - No signs of aphasia or neglect Cranial Nerves    - II: Visual Fields are full. PERRL    - III/IV/VI: EOMI without ptosis or diploplia.     - V: Facial sensation is grossly normal    - VII: Facial movement is symmetric.     - VIII: decreased hearing right ear, left grossly normal    - X: Uvula elevates symmetrically    - XI: Shoulder shrug is symmetric.    - XII: tongue is midline without atrophy or fasciculations.  Sensory: Sensation grossly intact to LT Plantars   - Toes are downgoing bilaterally.  Cerebellar    - FNF and HKS are intact bilaterally Mild left drift. + rhomberg, - asterixis    Results - Imaging/Labs   No results found for this or any previous visit (from the past 48 hour(s)).  Ct Head Wo Contrast  Result Date: 04/11/2018 CLINICAL DATA:  67 y/o  M; hypertension, ataxia, stroke suspected EXAM: CT HEAD WITHOUT CONTRAST TECHNIQUE: Contiguous axial images were obtained from the base of the skull through the vertex without intravenous contrast. COMPARISON:  None. FINDINGS: Brain: Right CP angle mass lesion measuring up to 2.4 cm. There is mass effect on the right cerebellum and fourth ventricle. Lateral and third ventriculomegaly may reflect associate obstructive hydrocephalus. No additional findings for stroke, hemorrhage, mass effect, extra-axial collection, or herniation. Vascular: No hyperdense vessel or unexpected calcification. Skull: Normal. Negative for fracture or focal lesion. Sinuses/Orbits: Bilateral maxillary sinus opacification with high attenuation inspissated secretions. Mucosal thickening of the ethmoid air cells and the left sphenoid sinus. Normal aeration of mastoid air cells. Orbits are unremarkable. Other: 15 mm dermal cyst within the right parietal scalp. IMPRESSION: 1. Right CP angle mass lesion measuring up to  2.4  cm. The lesion is probably a extra-axial schwannoma or meningioma, however, and intra-axial lesion is not excluded. Further evaluation with MRI of the head with and without contrast is recommended. 2. Mass effect effect on the right cerebellum and fourth ventricle. Lateral and third ventriculomegaly may reflect associated obstructive hydrocephalus. 3. Bilateral maxillary sinus opacification. These results will be called to the ordering clinician or representative by the Radiologist Assistant, and communication documented in the PACS or zVision Dashboard. Electronically Signed   By: Kristine Garbe M.D.   On: 04/11/2018 22:52   Logan Jeri Cos PV Contrast  Result Date: 04/12/2018 CLINICAL DATA:  Posterior fossa mass suggested by CT. EXAM: MRI HEAD WITHOUT AND WITH CONTRAST TECHNIQUE: Multiplanar, multiecho pulse sequences of the brain and surrounding structures were obtained without and with intravenous contrast. CONTRAST:  7.5 cc Gadavist COMPARISON:  Head CT same day.  Cervical MRI 05/19/2011. FINDINGS: Brain: Diffusion imaging does not show any acute or subacute infarction. There is mild generalized atrophy with slight ventricular prominence. Ventricular prominence is felt secondary to central atrophy, though early stage normal pressure hydrocephalus is not excluded. The patient has a large vestibular schwannoma with dominant extra canalicular component measuring 2.6 x 2.8 x 2.6 cm. Small intracanalicular component extends into the internal auditory canal. Mild internal cystic change. Mass-effect upon the brainstem and right cerebellar peduncle. The differential diagnosis does include meningioma but I think that is much less likely. No extra-axial fluid collection. Vascular: Major vessels at the base of the brain show flow. Skull and upper cervical spine: Negative Sinuses/Orbits: Bilateral maxillary sinusitis and fluid opacification. Mild inflammatory changes in the anterior ethmoids. Other sinuses are  clear. Other: None IMPRESSION: Primarily extra canalicular vestibular schwannoma the within the CP angle cistern region on the right measuring 2.6 x 2.8 x 2.6 cm, with mass-effect upon the pons and right middle cerebellar peduncle. See above for full discussion. Ventricular prominence, somewhat out of proportion to the sulci. This may simply be secondary to central atrophy, but normal pressure hydrocephalus is not excluded by imaging. There is no visible cause of obstructive hydrocephalus. Fourth ventricle and cerebral aqueduct appear sufficiently patent. Electronically Signed   By: Nelson Chimes M.D.   On: 04/12/2018 13:38    Impression/Plan   68 y.o. male with two week history of gait instability and chronic right hearing loss, who was found to have fairly large extra canalicular mass within the CP angle cistern on the right. Mass is likely a schwannoma vs meningioma. He is essentially at neurologic baseline. While this mass is large, there is no indication for emergent removal. Rec outpatient follow up so we can arrange for surgery when Logan Phillips is at his healthiest.  No need for steroids at this time.   The above was discussed with the patient and his two daughters. They know to call out office at discharge for f/u. Please call for any concerns.

## 2018-04-14 ENCOUNTER — Encounter (HOSPITAL_COMMUNITY): Payer: Self-pay | Admitting: Gastroenterology

## 2018-04-14 DIAGNOSIS — K529 Noninfective gastroenteritis and colitis, unspecified: Secondary | ICD-10-CM

## 2018-04-14 LAB — CORTISOL: CORTISOL PLASMA: 9.2 ug/dL

## 2018-04-14 LAB — CBC
HCT: 36.9 % — ABNORMAL LOW (ref 39.0–52.0)
Hemoglobin: 13.2 g/dL (ref 13.0–17.0)
MCH: 31.4 pg (ref 26.0–34.0)
MCHC: 35.8 g/dL (ref 30.0–36.0)
MCV: 87.6 fL (ref 80.0–100.0)
Platelets: 164 10*3/uL (ref 150–400)
RBC: 4.21 MIL/uL — ABNORMAL LOW (ref 4.22–5.81)
RDW: 13.2 % (ref 11.5–15.5)
WBC: 6.9 10*3/uL (ref 4.0–10.5)
nRBC: 0 % (ref 0.0–0.2)

## 2018-04-14 NOTE — Discharge Summary (Signed)
Physician Discharge Summary  Logan Phillips ZOX:[id number] DOB: 11-Feb-1951 DOA: 04/09/2018  PCP: Carlena Hurl, PA-C  Admit date: 04/09/2018 Discharge date: 04/14/2018  Admitted From: home Disposition:  Home  Recommendations for Outpatient Follow-up:  1. Follow up with GIin 1-2 weeks 2. Please obtain BMP/CBC in one week 3. Follow-up with neurosurgery in a week for surgical intervention.  Home Health:No Equipment/Devices:None  Discharge Condition:stable CODE STATUS:full Diet recommendation: Heart Healthy   Brief/Interim Summary: 68 y.o. male with a history of heavy alcohol abuse, chronic shoulder pain, hypertension comes to the ED complaining of bright red blood per rectum  Discharge Diagnoses:  Principal Problem:   Acute ischemic colitis [facility]) Active Problems:   Essential hypertension   Alcohol consumption heavy   Gastroesophageal reflux disease Acute ischemic colitis: On admission he was started on IV empiric antibiotics GI was consulted who recommended a colonoscopy performed on 04/12/2018 that showed possible ischemic colitis biopsy taken. His antibiotics were DC'd he remained afebrile with no leukocytosis he was able to tolerate his diet.  Essential hypertension: No changes were made.  Alcohol consumption heavy: Monitor on Ativan protocol no signs of withdrawal.  Dizziness due to schwannoma: The patient was complaining of dizziness, CT scan of the head was done on 04/11/2018 that showed right ankle mass lesion measuring up to 2.4 cm. An MRI was done that showed a possible vestibular schwannoma with compression of the pons and right cerebellar peduncle. Neurosurgery was consulted who recommended surgery, they will follow him as an outpatient to schedule surgical procedure.   Discharge Instructions  Discharge Instructions    Diet - low sodium heart healthy   Complete by:  As directed    Increase activity slowly   Complete by:  As directed      Allergies as of  04/14/2018   No Known Allergies     Medication List    TAKE these medications   CALCIUM 500 + D 500-125 MG-UNIT Tabs Generic drug:  Calcium Carbonate-Vitamin D Take 1 tablet by mouth daily.   fish oil-omega-3 fatty acids 1000 MG capsule Take 1 g by mouth daily.   lisinopril 20 MG tablet Commonly known as:  PRINIVIL,ZESTRIL Take 1 tablet (20 mg total) by mouth daily.   magnesium oxide 400 MG tablet Commonly known as:  MAG-OX Take 400 mg by mouth 2 (two) times daily.   meloxicam 15 MG tablet Commonly known as:  MOBIC Take 1 tablet (15 mg total) by mouth daily.   metoprolol tartrate 100 MG tablet Commonly known as:  LOPRESSOR Take 1 tablet (100 mg total) by mouth 2 (two) times daily.   milk thistle 175 MG tablet Take 175 mg by mouth daily.   multivitamin with minerals Tabs tablet Take 1 tablet by mouth daily.   omeprazole 40 MG capsule Commonly known as:  PRILOSEC Take 1 capsule (40 mg total) by mouth daily.   Potassium 75 MG Tabs Take 1 tablet by mouth daily.   pravastatin 20 MG tablet Commonly known as:  PRAVACHOL Take 1 tablet (20 mg total) by mouth every evening.   SUPER B COMPLEX/C PO Take 1 tablet by mouth daily.   zinc gluconate 50 MG tablet Take 50 mg by mouth daily.            Durable Medical Equipment  (From admission, onward)         Start     Ordered   04/13/18 1502  For home use only DME Cane  Once  04/13/18 1502         Follow-up Information    Tysinger, Camelia Eng, PA-C.   Specialty:  Family Medicine Contact information: [place] [address] [place] Follow up.   Why:  cane to be delivered to room prior to DC Contact information: [address] [address] [phone]          No Known Allergies  Consultations:  Gastroenterology  NeuroSurgery   Procedures/Studies: Ct Head Wo Contrast  Result Date: 04/11/2018 CLINICAL DATA:  68  y/o  M; hypertension, ataxia, stroke suspected EXAM: CT HEAD WITHOUT CONTRAST TECHNIQUE: Contiguous axial images were obtained from the base of the skull through the vertex without intravenous contrast. COMPARISON:  None. FINDINGS: Brain: Right CP angle mass lesion measuring up to 2.4 cm. There is mass effect on the right cerebellum and fourth ventricle. Lateral and third ventriculomegaly may reflect associate obstructive hydrocephalus. No additional findings for stroke, hemorrhage, mass effect, extra-axial collection, or herniation. Vascular: No hyperdense vessel or unexpected calcification. Skull: Normal. Negative for fracture or focal lesion. Sinuses/Orbits: Bilateral maxillary sinus opacification with high attenuation inspissated secretions. Mucosal thickening of the ethmoid air cells and the left sphenoid sinus. Normal aeration of mastoid air cells. Orbits are unremarkable. Other: 15 mm dermal cyst within the right parietal scalp. IMPRESSION: 1. Right CP angle mass lesion measuring up to 2.4 cm. The lesion is probably a extra-axial schwannoma or meningioma, however, and intra-axial lesion is not excluded. Further evaluation with MRI of the head with and without contrast is recommended. 2. Mass effect effect on the right cerebellum and fourth ventricle. Lateral and third ventriculomegaly may reflect associated obstructive hydrocephalus. 3. Bilateral maxillary sinus opacification. These results will be called to the ordering clinician or representative by the Radiologist Assistant, and communication documented in the PACS or zVision Dashboard. Electronically Signed   By: Kristine Garbe M.D.   On: 04/11/2018 22:52   Mr Jeri Cos DX Contrast  Result Date: 04/12/2018 CLINICAL DATA:  Posterior fossa mass suggested by CT. EXAM: MRI HEAD WITHOUT AND WITH CONTRAST TECHNIQUE: Multiplanar, multiecho pulse sequences of the brain and surrounding structures were obtained without and with intravenous contrast.  CONTRAST:  7.5 cc Gadavist COMPARISON:  Head CT same day.  Cervical MRI 05/19/2011. FINDINGS: Brain: Diffusion imaging does not show any acute or subacute infarction. There is mild generalized atrophy with slight ventricular prominence. Ventricular prominence is felt secondary to central atrophy, though early stage normal pressure hydrocephalus is not excluded. The patient has a large vestibular schwannoma with dominant extra canalicular component measuring 2.6 x 2.8 x 2.6 cm. Small intracanalicular component extends into the internal auditory canal. Mild internal cystic change. Mass-effect upon the brainstem and right cerebellar peduncle. The differential diagnosis does include meningioma but I think that is much less likely. No extra-axial fluid collection. Vascular: Major vessels at the base of the brain show flow. Skull and upper cervical spine: Negative Sinuses/Orbits: Bilateral maxillary sinusitis and fluid opacification. Mild inflammatory changes in the anterior ethmoids. Other sinuses are clear. Other: None IMPRESSION: Primarily extra canalicular vestibular schwannoma the within the CP angle cistern region on the right measuring 2.6 x 2.8 x 2.6 cm, with mass-effect upon the pons and right middle cerebellar peduncle. See above for full discussion. Ventricular prominence, somewhat out of proportion to the sulci. This may simply be secondary to central atrophy, but normal pressure hydrocephalus is not excluded by  imaging. There is no visible cause of obstructive hydrocephalus. Fourth ventricle and cerebral aqueduct appear sufficiently patent. Electronically Signed   By: Nelson Chimes M.D.   On: 04/12/2018 13:38   Ct Abdomen Pelvis W Contrast  Result Date: 04/09/2018 CLINICAL DATA:  Rectal bleeding beginning last night. Lower abdominal pain. EXAM: CT ABDOMEN AND PELVIS WITH CONTRAST TECHNIQUE: Multidetector CT imaging of the abdomen and pelvis was performed using the standard protocol following bolus  administration of intravenous contrast. CONTRAST:  118mL OMNIPAQUE IOHEXOL 300 MG/ML  SOLN COMPARISON:  None. FINDINGS: Lower chest: Normal Hepatobiliary: Mild fatty change of the liver. Two subcentimeter cysts. Gallbladder appears normal by CT. Pancreas: Normal Spleen: Normal Adrenals/Urinary Tract: Adrenal glands are normal. Kidneys are normal except for an insignificant 1 cm cyst on the right. No mass, stone or hydronephrosis. Bladder is normal. Stomach/Bowel: Segmental colitis beginning at the splenic flexure and extending to the distal descending colon. This distribution is concerning for ischemic insult, but inflammatory bowel disease and infectious colitis are not excluded. No sign of perforation or abscess. Small bowel appears normal. Remainder of the colon is normal. Vascular/Lymphatic: Atherosclerosis of the aorta but without aneurysm or dissection. IVC is normal. No retroperitoneal adenopathy. Reproductive: Normal Other: No free fluid or air. Musculoskeletal: Ordinary lower lumbar degenerative changes. IMPRESSION: Segmental colitis beginning at the splenic flexure and extending to the distal descending colon. This distribution is concerning for ischemic insult, but inflammatory bowel disease and infectious colitis are not excluded. No perforation or abscess. Electronically Signed   By: Nelson Chimes M.D.   On: 04/09/2018 14:12      Subjective:  No complains feels great. Discharge Exam: Vitals:   04/13/18 2117 04/14/18 0437  BP: (!) 148/101 (!) 150/80  Pulse: 70 (!) 52  Resp: 20 20  Temp: 98.7 F (37.1 C) 98.5 F (36.9 C)  SpO2: 99% 99%     General: Pt is alert, awake, not in acute distress Cardiovascular: RRR, S1/S2 +, no rubs, no gallops Respiratory: CTA bilaterally, no wheezing, no rhonchi Abdominal: Soft, NT, ND, bowel sounds + Extremities: no edema, no cyanosis    The results of significant diagnostics from this hospitalization (including imaging, microbiology, ancillary  and laboratory) are listed below for reference.     Microbiology: No results found for this or any previous visit (from the past 240 hour(s)).   Labs: BNP (last 3 results) No results for input(s): BNP in the last 8760 hours. Basic Metabolic Panel: Recent Labs  Lab 04/09/18 1221 04/10/18 0446 04/11/18 0602  NA 135 135 135  K 4.5 4.1 4.0  CL 98 102 102  CO2 22 23 23   GLUCOSE 133* 110* 93  BUN 19 14 11   CREATININE 1.20 1.10 0.96  CALCIUM 9.2 8.4* 8.4*   Liver Function Tests: Recent Labs  Lab 04/09/18 1221  AST 36  ALT 30  ALKPHOS 44  BILITOT 1.3*  PROT 7.4  ALBUMIN 4.2   No results for input(s): LIPASE, AMYLASE in the last 168 hours. No results for input(s): AMMONIA in the last 168 hours. CBC: Recent Labs  Lab 04/09/18 1221 04/10/18 0446 04/11/18 0602 04/14/18 0505  WBC 12.1* 10.7* 9.9 6.9  HGB 15.7 14.2 13.6 13.2  HCT 44.7 40.1 37.8* 36.9*  MCV 89.4 89.5 89.2 87.6  PLT 153 134* 122* 164   Cardiac Enzymes: No results for input(s): CKTOTAL, CKMB, CKMBINDEX, TROPONINI in the last 168 hours. BNP: Invalid input(s): POCBNP CBG: No results for input(s): GLUCAP in the last 168 hours.  D-Dimer No results for input(s): DDIMER in the last 72 hours. Hgb A1c No results for input(s): HGBA1C in the last 72 hours. Lipid Profile No results for input(s): CHOL, HDL, LDLCALC, TRIG, CHOLHDL, LDLDIRECT in the last 72 hours. Thyroid function studies No results for input(s): TSH, T4TOTAL, T3FREE, THYROIDAB in the last 72 hours.  Invalid input(s): FREET3 Anemia work up No results for input(s): VITAMINB12, FOLATE, FERRITIN, TIBC, IRON, RETICCTPCT in the last 72 hours. Urinalysis    Component Value Date/Time   LABSPEC 1.020 10/05/2017 0956   BILIRUBINUR negative 10/05/2017 0956   BILIRUBINUR NEG 07/03/2013 1015   KETONESUR negative 10/05/2017 0956   PROTEINUR negative 10/05/2017 0956   PROTEINUR NEG 07/03/2013 1015   UROBILINOGEN negative 07/03/2013 1015   NITRITE  Negative 10/05/2017 0956   NITRITE NEG 07/03/2013 1015   LEUKOCYTESUR Trace (A) 10/05/2017 0956   Sepsis Labs Invalid input(s): PROCALCITONIN,  WBC,  LACTICIDVEN Microbiology No results found for this or any previous visit (from the past 240 hour(s)).   Time coordinating discharge: 40 minutes  SIGNED:   Charlynne Cousins, MD  [facility]

## 2018-04-14 NOTE — Progress Notes (Signed)
Logan Phillips to be D/C'd Home per MD order.  Discussed prescriptions and follow up appointments with the patient. Prescriptions given to patient, medication list explained in detail. Pt verbalized understanding.  Allergies as of 04/14/2018   No Known Allergies     Medication List    TAKE these medications   CALCIUM 500 + D 500-125 MG-UNIT Tabs Generic drug:  Calcium Carbonate-Vitamin D Take 1 tablet by mouth daily.   fish oil-omega-3 fatty acids 1000 MG capsule Take 1 g by mouth daily.   lisinopril 20 MG tablet Commonly known as:  PRINIVIL,ZESTRIL Take 1 tablet (20 mg total) by mouth daily.   magnesium oxide 400 MG tablet Commonly known as:  MAG-OX Take 400 mg by mouth 2 (two) times daily.   meloxicam 15 MG tablet Commonly known as:  MOBIC Take 1 tablet (15 mg total) by mouth daily.   metoprolol tartrate 100 MG tablet Commonly known as:  LOPRESSOR Take 1 tablet (100 mg total) by mouth 2 (two) times daily.   milk thistle 175 MG tablet Take 175 mg by mouth daily.   multivitamin with minerals Tabs tablet Take 1 tablet by mouth daily.   omeprazole 40 MG capsule Commonly known as:  PRILOSEC Take 1 capsule (40 mg total) by mouth daily.   Potassium 75 MG Tabs Take 1 tablet by mouth daily.   pravastatin 20 MG tablet Commonly known as:  PRAVACHOL Take 1 tablet (20 mg total) by mouth every evening.   SUPER B COMPLEX/C PO Take 1 tablet by mouth daily.   zinc gluconate 50 MG tablet Take 50 mg by mouth daily.            Durable Medical Equipment  (From admission, onward)         Start     Ordered   04/13/18 1502  For home use only DME Cane  Once     04/13/18 1502          Vitals:   04/14/18 0437 04/14/18 1006  BP: (!) 150/80 (!) 123/96  Pulse: (!) 52 (!) 59  Resp: 20 18  Temp: 98.5 F (36.9 C) 98.7 F (37.1 C)  SpO2: 99% 99%    Skin clean, dry and intact without evidence of skin break down, no evidence of skin tears noted. IV catheter  discontinued intact. Site without signs and symptoms of complications. Dressing and pressure applied. Pt denies pain at this time. No complaints noted.  An After Visit Summary was printed and given to the patient. Patient escorted via [place], and D/C home via private auto.  Aneta Mins BSN, RN

## 2018-04-14 NOTE — Progress Notes (Signed)
Physical Therapy Treatment Patient Details Name: Logan Phillips MRN: [id number] DOB: 13-May-1950 Today's Date: 04/14/2018    History of Present Illness Logan Phillips is an 68 y.o. male with a history of heavy alcohol abuse, chronic shoulder pain, hypertension comes to the ED complaining of bright red blood per rectum.  pt found to have mass in brain - working it up.  Neuro ordered.    PT Comments    Pt received in bed with family in room; reports he is going to discharge soon; agreeable to therapy. He reports he has been walking around the hospital. Pt able to perform independent bed mobility, Mod(I) for sit to stand using cane. Pt able to ambulate approximately 400 ft in hallway using straight cane, supervision for safety and v/c for coordination of cane with opposite LE to maximize safety. Noted forward lean which decreased with correct use of cane. Educated pt on importance of using his cane for ambulation at home even when he doesn't think he needs it. Pt verbalized understanding. Pt left in chair with call bell in reach, RN in room.     Outpatient PT;Supervision for mobility/OOB     Equipment Recommendations  Rolling walker with 5" wheels    Recommendations for Other Services       Precautions / Restrictions Precautions Precautions: Fall Precaution Comments: Pt reports he feels he is going forward with momentum when he walks, history of 2-3 falls/week Restrictions Weight Bearing Restrictions: No    Mobility  Bed Mobility Overal bed mobility: Independent                Transfers Overall transfer level: Modified independent Equipment used: Straight cane Transfers: Sit to/from Stand Sit to Stand: Modified independent (Device/Increase time)         General transfer comment: Pt able to stand pushing off from bed with one hand, cane in other hand. No physical assist required.  Ambulation/Gait Ambulation/Gait assistance: Supervision Gait Distance (Feet): 400  Feet Assistive device: Straight cane Gait Pattern/deviations: Step-through pattern;Trunk flexed     General Gait Details: V/c for coordination of cane with opposite foot; noted forward momentum that decreased with correct use of cane.   Stairs             Wheelchair Mobility    Modified Rankin (Stroke Patients Only)       Balance Overall balance assessment: History of Falls;Needs assistance Sitting-balance support: No upper extremity supported;Feet supported Sitting balance-Leahy Scale: Normal     Standing balance support: Single extremity supported;During functional activity Standing balance-Leahy Scale: Good Standing balance comment: Pt able to maintain balance while ambulating with single UE support by straight cane                            Cognition Arousal/Alertness: Awake/alert Behavior During Therapy: WFL for tasks assessed/performed Overall Cognitive Status: Within Functional Limits for tasks assessed                                        Exercises      General Comments        Pertinent Vitals/Pain Pain Assessment: No/denies pain Pain Score: 0-No pain    Home Living                      Prior Function  PT Goals (current goals can now be found in the care plan section) Acute Rehab PT Goals PT Goal Formulation: With patient/family Time For Goal Achievement: 04/21/18 Potential to Achieve Goals: Good Progress towards PT goals: Progressing toward goals    Frequency    Min 3X/week      PT Plan Current plan remains appropriate    Co-evaluation              AM-PAC PT "6 Clicks" Mobility   Outcome Measure  Help needed turning from your back to your side while in a flat bed without using bedrails?: None Help needed moving from lying on your back to sitting on the side of a flat bed without using bedrails?: None Help needed moving to and from a bed to a chair (including a  wheelchair)?: None Help needed standing up from a chair using your arms (e.g., wheelchair or bedside chair)?: None Help needed to walk in hospital room?: A Little Help needed climbing 3-5 steps with a railing? : A Little 6 Click Score: 22    End of Session   Activity Tolerance: Patient tolerated treatment well;No increased pain Patient left: in chair;with call bell/phone within reach;with nursing/sitter in room;with family/visitor present Nurse Communication: Mobility status PT Visit Diagnosis: Unsteadiness on feet (R26.81);Other abnormalities of gait and mobility (R26.89);Repeated falls (R29.6);Muscle weakness (generalized) (M62.81)     Time:  -     Charges:                        Ronnell Guadalajara, SPT    Ronnell Guadalajara 04/14/2018, 11:33 AM

## 2018-04-14 NOTE — Care Management Note (Addendum)
Case Management Note  Patient Details  Name: Logan Phillips MRN: [id number] Date of Birth: 1950-10-11  Subjective/Objective:     Acute ischemic colitis               Action/Plan: PTA home with spouse. Noted CM note from 04/13/2018-pt declined [place] services and RW, but agreed to cane. [facility] notified of patient to transition home today. Cane to be delivered to bedside. No other transition of care needs identified.   Expected Discharge Date:  04/14/18               Expected Discharge Plan:  Home/Self Care  In-House Referral:  NA  Discharge planning Services  CM Consult  Post Acute Care Choice:  Home Health Choice offered to:  Patient  DME Arranged:  Kasandra Knudsen DME Agency:  [place]:  NA(Declined [facility] services) [place] Agency:  NA  Status of Service:  Completed, signed off  If discussed at [place] of Stay Meetings, dates discussed:    Additional Comments:  Bartholomew Crews, RN 04/14/2018, 11:17 AM

## 2018-04-15 DIAGNOSIS — D333 Benign neoplasm of cranial nerves: Secondary | ICD-10-CM

## 2018-04-15 HISTORY — DX: Benign neoplasm of cranial nerves: D33.3

## 2018-04-17 DIAGNOSIS — I10 Essential (primary) hypertension: Secondary | ICD-10-CM | POA: Diagnosis not present

## 2018-04-17 DIAGNOSIS — D333 Benign neoplasm of cranial nerves: Secondary | ICD-10-CM | POA: Diagnosis not present

## 2018-04-18 ENCOUNTER — Telehealth: Payer: Self-pay | Admitting: Medical

## 2018-04-18 ENCOUNTER — Encounter: Payer: Self-pay | Admitting: Medical

## 2018-04-18 ENCOUNTER — Telehealth: Payer: Self-pay | Admitting: Internal Medicine

## 2018-04-18 ENCOUNTER — Ambulatory Visit (INDEPENDENT_AMBULATORY_CARE_PROVIDER_SITE_OTHER): Payer: Medicare HMO | Admitting: Medical

## 2018-04-18 ENCOUNTER — Other Ambulatory Visit: Payer: Self-pay | Admitting: Neurosurgery

## 2018-04-18 VITALS — BP 120/90 | HR 58 | Temp 99.0°F | Resp 16 | Ht 68.0 in | Wt 173.6 lb

## 2018-04-18 DIAGNOSIS — H9193 Unspecified hearing loss, bilateral: Secondary | ICD-10-CM

## 2018-04-18 DIAGNOSIS — K219 Gastro-esophageal reflux disease without esophagitis: Secondary | ICD-10-CM | POA: Diagnosis not present

## 2018-04-18 DIAGNOSIS — G8929 Other chronic pain: Secondary | ICD-10-CM

## 2018-04-18 DIAGNOSIS — D3610 Benign neoplasm of peripheral nerves and autonomic nervous system, unspecified: Secondary | ICD-10-CM

## 2018-04-18 DIAGNOSIS — R9431 Abnormal electrocardiogram [ECG] [EKG]: Secondary | ICD-10-CM

## 2018-04-18 DIAGNOSIS — Z789 Other specified health status: Secondary | ICD-10-CM | POA: Diagnosis not present

## 2018-04-18 DIAGNOSIS — R270 Ataxia, unspecified: Secondary | ICD-10-CM

## 2018-04-18 DIAGNOSIS — I10 Essential (primary) hypertension: Secondary | ICD-10-CM

## 2018-04-18 DIAGNOSIS — K55039 Acute (reversible) ischemia of large intestine, extent unspecified: Secondary | ICD-10-CM | POA: Diagnosis not present

## 2018-04-18 DIAGNOSIS — M25511 Pain in right shoulder: Secondary | ICD-10-CM | POA: Diagnosis not present

## 2018-04-18 DIAGNOSIS — R42 Dizziness and giddiness: Secondary | ICD-10-CM

## 2018-04-18 MED ORDER — ACETAMINOPHEN ER 650 MG PO TBCR: 650.0000 mg | EXTENDED_RELEASE_TABLET | Freq: Three times a day (TID) | ORAL | 0 refills | Status: DC | PRN

## 2018-04-18 NOTE — Telephone Encounter (Signed)
Lorriane Shire from Dr. Carroll Kinds called to let us know that pt is having brain surgery on Tuesday and they have ordered a CT brain for surgery protocol but they noticed that we had ordered a MRI brain but want to let you know that he does not need to have MRI brain since they are having to do CT brain.   Looking at imaging. Looks like we ordered MRI brain July 2019 which was never scheduled. If ok, we can cancel this order.

## 2018-04-18 NOTE — Telephone Encounter (Signed)
Per shane,  Pt did not follow-up with MRI, ok to cancel this

## 2018-04-18 NOTE — Pre-Procedure Instructions (Signed)
Logan Phillips  04/18/2018      [facility] [address], [place] [address] Phone: [phone] Fax: [phone]    Your procedure is scheduled on Tuesday February 11th3.  Report to [facility] Admitting at 5:30 A.M.  Call this number if you have problems the morning of surgery:  [phone]   Remember:  Do not eat or drink after midnight.     Take these medicines the morning of surgery with A SIP OF WATER  metoprolol tartrate (LOPRESSOR) omeprazole (PRILOSEC)   7 days prior to surgery STOP taking any Aspirin(unless otherwise instructed by your surgeon), meloxicam (MOBIC), Aleve, Naproxen, Ibuprofen, Motrin, Advil, Goody's, BC's, all herbal medications, fish oil, and all vitamins     Do not wear jewelry  Do not wear lotions, powders, or colognes, or deodorant.  Do not shave 48 hours prior to surgery.  Men may shave face and neck.  Do not bring valuables to the hospital.  [facility] is not responsible for any belongings or valuables.  Contacts, eyeglasses, hearing aids, dentures or bridgework may not be worn into surgery.  Leave your suitcase in the car.  After surgery it may be brought to your room.  For patients admitted to the hospital, discharge time will be determined by your treatment team.  Patients discharged the day of surgery will not be allowed to drive home.   [place]- Preparing For Surgery  Before surgery, you can play an important role. Because skin is not sterile, your skin needs to be as free of germs as possible. You can reduce the number of germs on your skin by washing with CHG (chlorahexidine gluconate) Soap before surgery.  CHG is an antiseptic cleaner which kills germs and bonds with the skin to continue killing germs even after washing.    Oral Hygiene is also important to reduce your risk of infection.  Remember - BRUSH YOUR TEETH THE MORNING OF SURGERY WITH YOUR  REGULAR TOOTHPASTE  Please do not use if you have an allergy to CHG or antibacterial soaps. If your skin becomes reddened/irritated stop using the CHG.  Do not shave (including legs and underarms) for at least 48 hours prior to first CHG shower. It is OK to shave your face.  Please follow these instructions carefully.   1. Shower the NIGHT BEFORE SURGERY and the MORNING OF SURGERY with CHG.   2. If you chose to wash your hair, wash your hair first as usual with your normal shampoo.  3. After you shampoo, rinse your hair and body thoroughly to remove the shampoo.  4. Use CHG as you would any other liquid soap. You can apply CHG directly to the skin and wash gently with a scrungie or a clean washcloth.   5. Apply the CHG Soap to your body ONLY FROM THE NECK DOWN.  Do not use on open wounds or open sores. Avoid contact with your eyes, ears, mouth and genitals (private parts). Wash Face and genitals (private parts)  with your normal soap.  6. Wash thoroughly, paying special attention to the area where your surgery will be performed.  7. Thoroughly rinse your body with warm water from the neck down.  8. DO NOT shower/wash with your normal soap after using and rinsing off the CHG Soap.  9. Pat yourself dry with a CLEAN TOWEL.  10. Wear CLEAN PAJAMAS to bed the night before surgery, wear comfortable  clothes the morning of surgery  11. Place CLEAN SHEETS on your bed the night of your first shower and DO NOT SLEEP WITH PETS.    Day of Surgery: Shower as stated above. Do not apply any deodorants/lotions.  Please wear clean clothes to the hospital/surgery center.   Remember to brush your teeth WITH YOUR REGULAR TOOTHPASTE.   Please read over the following fact sheets that you were given.

## 2018-04-18 NOTE — Progress Notes (Signed)
Subjective: Chief Complaint  Patient presents with  . hospital Follow up    hospital follow up, surgery on tuesday for brain tumor   He is here with his adult daughter today.  She is a Marine scientist.  He is here for hospital follow-up from recent hospitalizations.  He was hospitalized 04/09/2018-04/14/2018 Diagnoses included ischemic colitis, hypertension, heavy alcohol consumption, GERD, new diagnosis of schwannoma  He is a 68 year old male with a history of heavy alcohol abuse, chronic shoulder pain, hypertension, that presented to the emergency department with bright red blood per rectum and abdominal pain.  Today he says he is doing fine except for his chronic issues.  Of note I have seen him regularly and he has not always mentioned the variety of symptoms he discussed today.  His daughter even comments that like a typical man he would not come in and tell us everything wrong with him.  He notes longstanding history of hearing loss, balance problems, headaches, dizziness, falls, and in recent weeks was falling a lot more.  He had not previously told me some of these issues other than some occasional dizziness.  He does endorse that he always thought his alcohol abuse contribute to some of these symptoms.  While in the hospital he was treated with several antibiotics for bowel infection or colitis, and is done with the antibiotics.  He currently denies blood in the stool or additional abdominal pain.  He also states that he is through with alcohol.  He does have ongoing shoulder pain and had to defer his right shoulder surgery due to all the other acute issues.  He requests pain medicine today.  His daughter whispered to me several times not to give him any type of opioids.  While in the hospital he was found to have a brain tumor.  He has preop scheduled tomorrow and is supposed to have surgery to remove this tumor next week.  He saw neurosurgery yesterday in consult.  He said that they did not  give him pain medicine, and would not give him pain medicine until after the surgery.  He notes that he was advised that he would be at home for a couple weeks after the surgery but could drive within several weeks of doing better.  He did get a colonoscopy while in the hospital.  He is supposed to be here for follow-up blood test today but says he has preop tomorrow and labs are supposed to be done then  He denies chest pain, shortness of breath, palpitations.  He is supposed to have gastroenterology follow-up scheduled but says this was put on the back burner due to the brain tumor  He is currently using Advil, 2 tablets once daily last few days to help with pain.  He will be having surgery at [facility] for the brain tumor   Past Medical History:  Diagnosis Date  . Chronic pain in shoulder 2018   [place]  . H/O echocardiogram 10/08/2016   EF 65-70%, normal wall motion, systolic function vigorous  . Hearing loss    pending hearing aids 06/2014  . Hypertension   . Lipoma   . Varicose vein    Current Outpatient Medications on File Prior to Visit  Medication Sig Dispense Refill  . Calcium Carbonate-Vitamin D (CALCIUM 500 + D) 500-125 MG-UNIT TABS Take 1 tablet by mouth daily.     . fish oil-omega-3 fatty acids 1000 MG capsule Take 1 g by mouth daily.    Marland Kitchen  lisinopril (PRINIVIL,ZESTRIL) 20 MG tablet Take 1 tablet (20 mg total) by mouth daily. 90 tablet 3  . magnesium oxide (MAG-OX) 400 MG tablet Take 400 mg by mouth 2 (two) times daily.    . metoprolol tartrate (LOPRESSOR) 100 MG tablet Take 1 tablet (100 mg total) by mouth 2 (two) times daily. 180 tablet 3  . Multiple Vitamin (MULITIVITAMIN WITH MINERALS) TABS Take 1 tablet by mouth daily.    . Potassium 75 MG TABS Take 1 tablet by mouth daily.     . SUPER B COMPLEX/C PO Take 1 tablet by mouth daily.     Marland Kitchen zinc gluconate 50 MG tablet Take 50 mg by mouth daily.    . milk thistle 175 MG tablet Take 175 mg by  mouth daily.    Marland Kitchen omeprazole (PRILOSEC) 40 MG capsule Take 1 capsule (40 mg total) by mouth daily. (Patient not taking: Reported on 04/18/2018) 30 capsule 2  . pravastatin (PRAVACHOL) 20 MG tablet Take 1 tablet (20 mg total) by mouth every evening. (Patient not taking: Reported on 04/18/2018) 90 tablet 0   No current facility-administered medications on file prior to visit.    ROS as in subjective   Objective: BP 120/90   Pulse (!) 58   Temp 99 F (37.2 C) (Oral)   Resp 16   Ht 5\' 8"  (1.727 m)   Wt 173 lb 9.6 oz (78.7 kg)   SpO2 98%   BMI 26.40 kg/m   Wt Readings from Last 3 Encounters:  04/18/18 173 lb 9.6 oz (78.7 kg)  04/13/18 170 lb 3.2 oz (77.2 kg)  02/22/18 175 lb 3.2 oz (79.5 kg)   BP Readings from Last 3 Encounters:  04/18/18 120/90  04/14/18 (!) 123/96  02/22/18 120/78   General appearance: alert, no distress, WD/WN, seated on exam table with a cane Psych: Pleasant and seems a little fatigued compared to his normal self HEENT: normocephalic, sclerae anicteric, TMs pearly, nares patent, no discharge or erythema, pharynx normal Oral cavity: MMM, no lesions Neck: supple, no lymphadenopathy, no thyromegaly, no masses Heart: RRR, normal S1, S2, no murmurs Lungs: CTA bilaterally, no wheezes, rhonchi, or rales Abdomen: +bs, soft, non tender, non distended, no masses, no hepatomegaly, no splenomegaly Pulses: 2+ symmetric, upper and lower extremities, normal cap refill Neuro: He is a little unsteady on his feet compared to prior visits otherwise non focal exam   EKG: Indication: Hypertension Ventricular rate 54 bpm Sinus bradycardia with sinus arrhythmia PR 124 ms QRS duration 76 ms QTC 411 ms Axis 52 degrees ST wave abnormality including T wave inversions in all precordial leads Compared to 2015 EKG no changes    Assessment: Encounter Diagnoses  Name Primary?  . Schwannoma Yes  . Acute ischemic colitis ([place])   . Essential hypertension   . Gastroesophageal  reflux disease, esophagitis presence not specified   . Alcohol consumption heavy   . Ataxia   . Chronic right shoulder pain   . Dizziness   . Abnormal EKG   . Hearing difficulty of both ears     Plan: Acute ischemic colitis-I reviewed his discharge summary, labs, head scans, other imaging.  We discussed his medication list, he was advised to stop all NSAIDs, no other medicines were changed with this hospitalization.  He completed a course of Cefepime and Metronidazole.   His colonoscopy from 04/12/18 showed an area of significantly erythematous, congested, ulcerated mucosa was found in the sigmoid colon from, 30-60 cm-biopsies done--?ischemic colits.  A few  small-mouthed diverticula were found in the sigmoid colon. He will follow-up with gastroenterology Dr. Dallas Breeding. Collene Mares, but this will be put on hold until after his neurosurgery next week  Schwannoma-new per this hospital visit.  Interestingly I saw him in July 2019 when we discussed some symptoms that warranted a head scan but he would not agree to head scan at that time.  He likely has had this tumor for the past year.  He has preop scheduled tomorrow and surgery planned for next week.  Hypertension-compliant with medication, a little elevated today but normally his blood pressures run within normal limits controlled.  I reviewed his cardiology consult notes with Dr. Tollie Eth from 2016.  At that time Dr. Wynonia Lawman felt that his EKG changes were present for at least 20 years, did recommend an echocardiogram, but did not feel he needed a stress test at that time.  He felt like he probably had some hypertrophy given changes associated with LVH on EKG.  He had an echocardiogram 09/2016 showing normal left ventricle systolic function, 65 to 95% ejection fraction, no wall motion abnormalities, there was some abnormal left ventricular relaxation, grade 1 diastolic dysfunction, trivial regurgitation of mitral and tricuspid valves.    GERD-he has only  been taking his PPI intermittent but probably should be taking this full-time given his history of alcohol abuse.  We discussed taking this daily  Alcohol abuse-advised he stop all alcohol.  We discussed this along with his daughter in the room who also says she and her sister have consistently been encouraging him to quit alcohol  Dizziness, ataxia- hopefully this will resolve or improve as the tumor gets resected  Hard of hearing- I have offered audiology consult in the past.  He wants to see how things would change after the surgery  Chronic right shoulder pain- he has a history of alcohol abuse as well as prior substance abuse.  We have tried to avoid narcotics in general.  I have helped him occasionally with his pain medication leading up to his Ortho consult recently.  Unfortunately the orthopedic surgery for his right shoulder has to be put on hold at this time.  His daughter whispered to me a few times to avoid narcotics which we will try to do.  I will call over to neurosurgery consult before deciding on any type of pain relief today  Logan Phillips was seen today for hospital follow up.  Diagnoses and all orders for this visit:  Schwannoma  Acute ischemic colitis ([place])  Essential hypertension -     EKG 12-Lead  Gastroesophageal reflux disease, esophagitis presence not specified  Alcohol consumption heavy  Ataxia  Chronic right shoulder pain  Dizziness  Abnormal EKG  Hearing difficulty of both ears  Other orders -     acetaminophen (TYLENOL 8 HOUR) 650 MG CR tablet; Take 1 tablet (650 mg total) by mouth every 8 (eight) hours as needed for pain.  Spent > 45 minutes face to face with patient in discussion of symptoms, evaluation, plan and recommendations.    Follow up tomorrow for neurosurgery preop as planned.  Follow up with GI 3-4 weeks after neurosurgery  Answered his questions as well as his daughter's questions.  His daughters will be taking care of him post op.  He  will be staying with one of his daughters.     F/u with med 3-4 weeks post op.

## 2018-04-18 NOTE — Telephone Encounter (Signed)
To clarify from 04/18/2018 visit:  I called in Tylenol 650mg  he can take up to 3 times day for worse pain, but I would rather him use this sparingly when the pain is worse.   He should STOP all NSAIDs/anti-inflammatories including Meloxicam he was on, and all over the counter NSAIDs including Ibuprofen, Aleve, Advil, Motrin, Naproxen given the ulcerations he had in the colon  He should refrain from Alcohol!!!  I don't feel comfortable giving him anything else for pain other than 650mg  Tylenol.  He needs to take the Omeprazole/Prilosec EVERY DAY.  Go ahead and tentatively schedule a follow up about 3 weeks after his surgery for the brain tumor, as I don't want him to get lost in the shuffle in terms of gastroenterology and orthopedic follow up.

## 2018-04-18 NOTE — Patient Instructions (Signed)
Recommendations:  Consider a probiotic daily such as Align since you were recently on antibiotics  I wish you the best with your upcoming surgery  Go for preop tomorrow as planned  Continue your regular medications as usual  STOP ALCOHOL!  I will check with neurosurgery regarding pain control

## 2018-04-19 ENCOUNTER — Telehealth: Payer: Self-pay | Admitting: Medical

## 2018-04-19 ENCOUNTER — Ambulatory Visit: Payer: Medicare HMO | Admitting: Medical

## 2018-04-19 ENCOUNTER — Inpatient Hospital Stay (HOSPITAL_COMMUNITY)
Admission: RE | Admit: 2018-04-19 | Discharge: 2018-04-19 | Disposition: A | Discharge status: Home / Self Care | Payer: Medicare HMO | Source: Ambulatory Visit

## 2018-04-19 NOTE — Telephone Encounter (Signed)
See prior note

## 2018-04-19 NOTE — Telephone Encounter (Signed)
Patient states that his brain surgery has been cancelled due to not having the correct instruments.

## 2018-04-19 NOTE — Telephone Encounter (Signed)
Pt called and stated that his surgery was cancelled and has not been rescheduled at this time. He is coming in to see you today for knee pain.

## 2018-04-19 NOTE — Telephone Encounter (Signed)
Left message on voicemail for patient to call back. 

## 2018-04-21 DIAGNOSIS — M25562 Pain in left knee: Secondary | ICD-10-CM | POA: Diagnosis not present

## 2018-04-24 ENCOUNTER — Encounter: Payer: Self-pay | Admitting: Medical

## 2018-04-25 DIAGNOSIS — M25562 Pain in left knee: Secondary | ICD-10-CM | POA: Diagnosis not present

## 2018-05-02 NOTE — Pre-Procedure Instructions (Signed)
WARIS RODGER  05/02/2018      [facility] [address] [place], [place] [address] Phone: [phone] Fax: [phone]    Your procedure is scheduled on February 25  Report to [place] at [place].M.  Call this number if you have problems the morning of surgery:  [phone]   Remember:  Do not eat or drink after midnight.    Take these medicines the morning of surgery with A SIP OF WATER  metoprolol tartrate (LOPRESSOR  7 days prior to surgery STOP taking any Aspirin (unless otherwise instructed by your surgeon), Aleve, Naproxen, Ibuprofen, Motrin, Advil, Goody's, BC's, all herbal medications, fish oil, and all vitamins.     Do not wear jewelry  Do not wear lotions, powders, or cologne, or deodorant.  Men may shave face and neck.  Do not bring valuables to the hospital.  [facility] is not responsible for any belongings or valuables.  Contacts, dentures or bridgework may not be worn into surgery.  Leave your suitcase in the car.  After surgery it may be brought to your room.  For patients admitted to the hospital, discharge time will be determined by your treatment team.  Patients discharged the day of surgery will not be allowed to drive home.    Special instructions:   [place]- Preparing For Surgery  Before surgery, you can play an important role. Because skin is not sterile, your skin needs to be as free of germs as possible. You can reduce the number of germs on your skin by washing with CHG (chlorahexidine gluconate) Soap before surgery.  CHG is an antiseptic cleaner which kills germs and bonds with the skin to continue killing germs even after washing.    Oral Hygiene is also important to reduce your risk of infection.  Remember - BRUSH YOUR TEETH THE MORNING OF SURGERY WITH YOUR REGULAR TOOTHPASTE  Please do not use if you have an allergy to CHG or antibacterial soaps. If  your skin becomes reddened/irritated stop using the CHG.  Do not shave (including legs and underarms) for at least 48 hours prior to first CHG shower. It is OK to shave your face.  Please follow these instructions carefully.   1. Shower the NIGHT BEFORE SURGERY and the MORNING OF SURGERY with CHG.   2. If you chose to wash your hair, wash your hair first as usual with your normal shampoo.  3. After you shampoo, rinse your hair and body thoroughly to remove the shampoo.  4. Use CHG as you would any other liquid soap. You can apply CHG directly to the skin and wash gently with a scrungie or a clean washcloth.   5. Apply the CHG Soap to your body ONLY FROM THE NECK DOWN.  Do not use on open wounds or open sores. Avoid contact with your eyes, ears, mouth and genitals (private parts). Wash Face and genitals (private parts)  with your normal soap.  6. Wash thoroughly, paying special attention to the area where your surgery will be performed.  7. Thoroughly rinse your body with warm water from the neck down.  8. DO NOT shower/wash with your normal soap after using and rinsing off the CHG Soap.  9. Pat yourself dry with a CLEAN TOWEL.  10. Wear CLEAN PAJAMAS to bed the night before surgery, wear comfortable clothes the morning of surgery  11. Place CLEAN SHEETS on your  bed the night of your first shower and DO NOT SLEEP WITH PETS.    Day of Surgery:  Do not apply any deodorants/lotions.  Please wear clean clothes to the hospital/surgery center.   Remember to brush your teeth WITH YOUR REGULAR TOOTHPASTE.    Please read over the following fact sheets that you were given.

## 2018-05-03 ENCOUNTER — Encounter (HOSPITAL_COMMUNITY)
Admission: RE | Admit: 2018-05-03 | Discharge: 2018-05-03 | Disposition: A | Discharge status: Home / Self Care | Payer: Medicare HMO | Source: Ambulatory Visit | Attending: Neurosurgery | Admitting: Neurosurgery

## 2018-05-03 ENCOUNTER — Other Ambulatory Visit (HOSPITAL_COMMUNITY): Payer: Self-pay | Admitting: Neurosurgery

## 2018-05-03 ENCOUNTER — Other Ambulatory Visit: Payer: Self-pay

## 2018-05-03 ENCOUNTER — Encounter (HOSPITAL_COMMUNITY): Payer: Self-pay

## 2018-05-03 ENCOUNTER — Other Ambulatory Visit: Payer: Self-pay | Admitting: Neurosurgery

## 2018-05-03 DIAGNOSIS — Z01812 Encounter for preprocedural laboratory examination: Secondary | ICD-10-CM | POA: Diagnosis not present

## 2018-05-03 DIAGNOSIS — D333 Benign neoplasm of cranial nerves: Secondary | ICD-10-CM

## 2018-05-03 HISTORY — DX: Benign neoplasm of cranial nerves: D33.3

## 2018-05-03 LAB — BASIC METABOLIC PANEL
Anion gap: 10 (ref 5–15)
BUN: 31 mg/dL — ABNORMAL HIGH (ref 8–23)
CHLORIDE: 101 mmol/L (ref 98–111)
CO2: 23 mmol/L (ref 22–32)
Calcium: 9.8 mg/dL (ref 8.9–10.3)
Creatinine, Ser: 1.51 mg/dL — ABNORMAL HIGH (ref 0.61–1.24)
GFR calc Af Amer: 55 mL/min — ABNORMAL LOW (ref >60)
GFR calc non Af Amer: 47 mL/min — ABNORMAL LOW (ref >60)
Glucose, Bld: 114 mg/dL — ABNORMAL HIGH (ref 70–99)
Potassium: 5.3 mmol/L — ABNORMAL HIGH (ref 3.5–5.1)
Sodium: 134 mmol/L — ABNORMAL LOW (ref 135–145)

## 2018-05-03 LAB — CBC
HCT: 47.8 % (ref 39.0–52.0)
HEMOGLOBIN: 15.9 g/dL (ref 13.0–17.0)
MCH: 29.9 pg (ref 26.0–34.0)
MCHC: 33.3 g/dL (ref 30.0–36.0)
MCV: 89.8 fL (ref 80.0–100.0)
Platelets: 221 10*3/uL (ref 150–400)
RBC: 5.32 MIL/uL (ref 4.22–5.81)
RDW: 12.1 % (ref 11.5–15.5)
WBC: 6.5 10*3/uL (ref 4.0–10.5)
nRBC: 0 % (ref 0.0–0.2)

## 2018-05-03 LAB — TYPE AND SCREEN
ABO/RH(D): O POS
Antibody Screen: NEGATIVE

## 2018-05-03 NOTE — Progress Notes (Addendum)
PCP - Chana Bode, PA-C Cardiologist - pt denies  Chest x-ray - denies past year, no recent respiratory infections/complications EKG - 07/18/7012 in [facility]  Stress Test - pt denies ECHO - 10/08/2016 in [place] in [facility]  Sleep Study - pt denies CPAP - n/a  Fasting Blood Sugar - n/a Checks Blood Sugar _____ times a day-n/a  Blood Thinner Instructions: n/a Aspirin Instructions: n/a  Anesthesia review: Yes-EKG-old EKGs on chart from PCP  Patient denies shortness of breath, fever, cough and chest pain at PAT appointment  Patient verbalized understanding of instructions that were given to them at the PAT appointment. Patient was also instructed that they will need to review over the PAT instructions again at home before surgery.

## 2018-05-04 NOTE — Progress Notes (Signed)
Anesthesia Chart Review:  Case:  [id number] Date/Time:  05/09/18 0715   Procedures:      Right retrosigmoid craniectomy, resection of acoustic neuroma with intraoperative facial monitoring/Brain Lab (Right )     APPLICATION OF CRANIAL NAVIGATION (Right )   Anesthesia type:  General   Pre-op diagnosis:  Vestibular schwannoma   Location:  [facility] OR ROOM 21 / [place] [facility]   Surgeon:  Consuella Lose, MD      DISCUSSION: Patient is a 68 year old male scheduled for the above procedure.  History includes never smoker, HTN, vestibular schwannoma, heavy alcohol abuse (sober since ~ 04/09/18), ischemic colitis (03/2018).  - Admission 04/09/18 for hematochezia. S/p colonoscopy confirming acute ischemic colitis.  Due to heavy ETOH intake he was started on alcohol prophylaxis protocol. He also complained for dizziness and head CT on 04/11/18 showed a 3.4 cm right CP angle mass with mass effect on the right cerebellum and fourth ventricle, felt likely a schwannoma.    His BUN/Cr are up to 31/1.51 since 04/11/18 (11/0.96). Had CT abd/pelvis with contrast on 04/09/18 and MR brain without/with contrast on 04/12/18. He has a chronically abnormal EKG as discussed below (see PROVIDERS). 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 He has had loose stools (~ one/day) since his colonoscopy/ischemic colitis. No further blood noted. His appetite isn't as good and only drinking about 3 glasses of water/day. He has been off NSIADS and alcohol since his hospitalization. He has not had any new BP medication changes. Discussed with patient and daughter that his that labs suggest he is likely a little dehydrated. His daughter [phone]) plans to add Pedialyte to his intake.   Reviewed with anesthesiologist Nolon Nations, MD. Patient's EKG is stable with previous cardiology evaluations. He denied CV symptoms or recent change in  functional status. Patient's daughter will attempt to help her dad stay more hydrated over the next few days. Will plan for STAT BMET on the day of surgery to ensure renal function stable and no significant hyperkalemia. If labs stable and otherwise no acute changes then it is anticipated that he can proceed as planned.   VS: BP 102/72   Pulse 66   Temp 36.7 C   Resp 18   Ht 5\' 7"  (1.702 m)   Wt 72.5 kg   SpO2 97%   BMI 25.03 kg/m    PROVIDERS: Tysinger, Camelia Eng, PA-C is PCP. Last visit 04/18/18. He was aware of surgery plans and did routine EKG which showed stable marked T wave abnormality. Carol Ada, MD is GI - He is not followed routinely by a cardiologist, but was seen by Roe Rutherford in 1995 and had a LHC showing moderate concentric LVH without CAD. He was seen most recently by Ezzard Standing, MD (recently retired) on 07/23/14 for abnormal EKG (dating back > 20 years; EKG showed "marked inferolateral and anterolateral T-wave abnormalities and voltage for LVH). 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 Last echo in 2018 showed normal LV global longitudinal strain at -18.1%, normal LV cavity size, EF, and wall motion.   LABS: Preoperative labs noted. Total bilirubin 1.3 with normal AST/ALTLFTs 04/09/18.  (all labs ordered are listed, but only abnormal results are displayed)  Labs Reviewed  BASIC METABOLIC PANEL - Abnormal; Notable for the following components:  Result Value   Sodium 134 (*)    Potassium 5.3 (*)    Glucose, Bld 114 (*)    BUN 31 (*)    Creatinine, Ser 1.51 (*)    GFR calc non Af Amer 47 (*)    GFR calc Af Amer 55 (*)    All other components within normal limits  CBC  TYPE AND SCREEN   Lab Results  Component Value Date   CREATININE 1.51 (H) 05/03/2018   CREATININE 0.96 04/11/2018   CREATININE 1.10 04/10/2018    IMAGES: MRI brain 04/12/18: IMPRESSION: - Primarily extra  canalicular vestibular schwannoma the within the CP angle cistern region on the right measuring 2.6 x 2.8 x 2.6 cm, with mass-effect upon the pons and right middle cerebellar peduncle. See above for full discussion. -  Ventricular prominence, somewhat out of proportion to the sulci. This may simply be secondary to central atrophy, but normal pressure hydrocephalus is not excluded by imaging. There is no visible cause of obstructive hydrocephalus. Fourth ventricle and cerebral aqueduct appear sufficiently patent.  EKG: 04/18/18: SB at 55 bpm. Possible LAE. ST/T wave abnormality, consider anterolateral ischemia.    CV: Echo 10/08/16: Study Conclusions - Left ventricle: GLSS is normal at -18.1% The cavity size was   normal. Systolic function was vigorous. The estimated ejection   fraction was in the range of 65% to 70%. Wall motion was normal;   there were no regional wall motion abnormalities. There was an   increased relative contribution of atrial contraction to   ventricular filling. Doppler parameters are consistent with   abnormal left ventricular relaxation (grade 1 diastolic   dysfunction). - Mitral valve: There was trivial regurgitation. - Tricuspid valve: There was trivial regurgitation.   Remote cardiac cath on 08/06/93 showed no evidence of atherosclerotic CAD with moderate LVH, probably concentric cardiomyopathy.   Past Medical History:  Diagnosis Date  . Chronic pain in shoulder 2018   [place]  . H/O echocardiogram 10/08/2016   EF 65-70%, normal wall motion, systolic function vigorous  . Hearing loss    pending hearing aids 06/2014  . Hypertension   . Lipoma   . Varicose vein   . Vestibular schwannoma [facility])     Past Surgical History:  Procedure Laterality Date  . APPENDECTOMY    . BIOPSY  04/12/2018   Procedure: BIOPSY;  Surgeon: Juanita Craver, MD;  Location[facility] ENDOSCOPY;  Service: Endoscopy;;  . COLONOSCOPY     age 39, he did not f/u with  referral 2018  . COLONOSCOPY WITH PROPOFOL N/A 04/12/2018   Procedure: COLONOSCOPY WITH PROPOFOL;  Surgeon: Juanita Craver, MD;  Location: [facility] ENDOSCOPY;  Service: Endoscopy;  Laterality: N/A;  . LUMBAR DISC SURGERY  2002   Dr. Rita Ohara  . WISDOM TOOTH EXTRACTION      MEDICATIONS: . acetaminophen (TYLENOL 8 HOUR) 650 MG CR tablet  . Calcium Carbonate-Vitamin D (CALCIUM 500 + D) 500-125 MG-UNIT TABS  . fish oil-omega-3 fatty acids 1000 MG capsule  . lisinopril (PRINIVIL,ZESTRIL) 20 MG tablet  . magnesium oxide (MAG-OX) 400 MG tablet  . metoprolol tartrate (LOPRESSOR) 100 MG tablet  . milk thistle 175 MG tablet  . Multiple Vitamin (MULITIVITAMIN WITH MINERALS) TABS  . omeprazole (PRILOSEC) 40 MG capsule  . Potassium 75 MG TABS  . pravastatin (PRAVACHOL) 20 MG tablet  . SUPER B COMPLEX/C PO  . zinc gluconate 50 MG tablet   No current facility-administered medications for this encounter.     Ebony Hail  [facility] Surgical Short Stay/Anesthesiology [facility] Phone [phone] [facility] Phone [phone] 05/04/2018 2:55 PM

## 2018-05-05 ENCOUNTER — Ambulatory Visit (HOSPITAL_COMMUNITY): Payer: Medicare HMO

## 2018-05-08 ENCOUNTER — Ambulatory Visit (HOSPITAL_COMMUNITY)
Admission: RE | Admit: 2018-05-08 | Discharge: 2018-05-08 | Disposition: A | Discharge status: Home / Self Care | Payer: Medicare HMO | Source: Ambulatory Visit | Attending: Neurosurgery | Admitting: Neurosurgery

## 2018-05-08 DIAGNOSIS — D333 Benign neoplasm of cranial nerves: Secondary | ICD-10-CM | POA: Insufficient documentation

## 2018-05-08 DIAGNOSIS — G9389 Other specified disorders of brain: Secondary | ICD-10-CM | POA: Diagnosis not present

## 2018-05-08 NOTE — Anesthesia Preprocedure Evaluation (Addendum)
Anesthesia Evaluation  Patient identified by MRN, date of birth, ID band Patient awake    Reviewed: Allergy & Precautions, NPO status , Patient's Chart, lab work & pertinent test results, reviewed documented beta blocker date and time   Airway Mallampati: II  TM Distance: >3 FB Neck ROM: Full    Dental  (+) Dental Advisory Given   Pulmonary neg pulmonary ROS,    breath sounds clear to auscultation       Cardiovascular hypertension, Pt. on medications and Pt. on home beta blockers  Rhythm:Regular Rate:Normal     Neuro/Psych  Neuromuscular disease (vestibular schwannoma)    GI/Hepatic GERD  ,(+)     substance abuse  alcohol use,   Endo/Other  negative endocrine ROS  Renal/GU negative Renal ROS     Musculoskeletal   Abdominal   Peds  Hematology negative hematology ROS (+)   Anesthesia Other Findings   Reproductive/Obstetrics                            Lab Results  Component Value Date   WBC 6.5 05/03/2018   HGB 15.9 05/03/2018   HCT 47.8 05/03/2018   MCV 89.8 05/03/2018   PLT 221 05/03/2018   Lab Results  Component Value Date   CREATININE 1.51 (H) 05/03/2018   BUN 31 (H) 05/03/2018   NA 134 (L) 05/03/2018   K 5.3 (H) 05/03/2018   CL 101 05/03/2018   CO2 23 05/03/2018    Anesthesia Physical Anesthesia Plan  ASA: III  Anesthesia Plan: General   Post-op Pain Management:    Induction: Intravenous  PONV Risk Score and Plan: 2 and Dexamethasone, Ondansetron and Treatment may vary due to age or medical condition  Airway Management Planned: Oral ETT  Additional Equipment: Arterial line  Intra-op Plan:   Post-operative Plan: Extubation in OR  Informed Consent: I have reviewed the patients History and Physical, chart, labs and discussed the procedure including the risks, benefits and alternatives for the proposed anesthesia with the patient or authorized representative who  has indicated his/her understanding and acceptance.     Dental advisory given  Plan Discussed with: CRNA  Anesthesia Plan Comments: (GETA. Remifentanil gtt. A-line.)      Anesthesia Quick Evaluation

## 2018-05-08 NOTE — H&P (Signed)
Chief Complaint   Brain tumor  HPI   HPI: Logan Phillips is a 68 y.o. male with several week history of gait instability and dizziness  As well as several year history of decreased hearing on the right.  He was recently admitted for ischemic colitis and due to history of dizziness, he underwent a CT scan followed by MRI which demonstrated a large right-sided CP angle tumor.   He presents today for surgical resection of the tumor.  He is without any concerns.  He is fully recovered from his episode of ischemic colitis.  Patient Active Problem List   Diagnosis Date Noted  . Schwannoma 04/18/2018  . Acute ischemic colitis ([place]) 04/09/2018  . Arthritis 02/22/2018  . Alcohol consumption heavy 02/22/2018  . Gastroesophageal reflux disease 02/22/2018  . Skin lesion 10/05/2017  . Ataxia 10/05/2017  . Dizziness 10/05/2017  . Memory change 10/05/2017  . Chronic right shoulder pain 03/18/2017  . Screening for diabetes mellitus 10/04/2016  . Vaccine counseling 10/04/2016  . Encounter for health maintenance examination in adult 10/04/2016  . Need for pneumococcal vaccination 10/04/2016  . Lipoma of torso 10/04/2016  . Hearing difficulty of both ears 10/04/2016  . Abnormal EKG 07/23/2014  . Varicose veins of left lower extremity   . Essential hypertension 07/08/2014  . Foot pain, left 04/14/2011    PMH: Past Medical History:  Diagnosis Date  . Chronic pain in shoulder 2018   [place]  . H/O echocardiogram 10/08/2016   EF 65-70%, normal wall motion, systolic function vigorous  . Hearing loss    pending hearing aids 06/2014  . Hypertension   . Lipoma   . Varicose vein   . Vestibular schwannoma ([facility])     PSH: Past Surgical History:  Procedure Laterality Date  . APPENDECTOMY    . BIOPSY  04/12/2018   Procedure: BIOPSY;  Surgeon: Juanita Craver, MD;  Location: [facility] ENDOSCOPY;  Service: Endoscopy;;  . COLONOSCOPY     age 43, he did not f/u with referral 2018  .  COLONOSCOPY WITH PROPOFOL N/A 04/12/2018   Procedure: COLONOSCOPY WITH PROPOFOL;  Surgeon: Juanita Craver, MD;  Location: [facility] ENDOSCOPY;  Service: Endoscopy;  Laterality: N/A;  . LUMBAR DISC SURGERY  2002   Dr. Rita Ohara  . WISDOM TOOTH EXTRACTION      No medications prior to admission.    SH: Social History   Tobacco Use  . Smoking status: Never Smoker  . Smokeless tobacco: Never Used  Substance Use Topics  . Alcohol use: Not Currently    Alcohol/week: 32.0 standard drinks    Types: 30 Cans of beer, 2 Shots of liquor per week    Comment: stopped 3 weeks ago 04/12/2018  . Drug use: No    MEDS: Prior to Admission medications   Medication Sig Start Date End Date Taking? Authorizing Provider  acetaminophen (TYLENOL 8 HOUR) 650 MG CR tablet Take 1 tablet (650 mg total) by mouth every 8 (eight) hours as needed for pain. 04/18/18   Tysinger, Camelia Eng, PA-C  Calcium Carbonate-Vitamin D (CALCIUM 500 + D) 500-125 MG-UNIT TABS Take 1 tablet by mouth daily.     [provider]  fish oil-omega-3 fatty acids 1000 MG capsule Take 1 g by mouth daily.    [provider]  lisinopril (PRINIVIL,ZESTRIL) 20 MG tablet Take 1 tablet (20 mg total) by mouth daily. 10/06/17   Tysinger, Camelia Eng, PA-C  magnesium oxide (MAG-OX) 400 MG tablet Take 400 mg by  mouth 2 (two) times daily.    [provider]  metoprolol tartrate (LOPRESSOR) 100 MG tablet Take 1 tablet (100 mg total) by mouth 2 (two) times daily. 10/06/17   Tysinger, Camelia Eng, PA-C  milk thistle 175 MG tablet Take 175 mg by mouth daily.    [provider]  Multiple Vitamin (MULITIVITAMIN WITH MINERALS) TABS Take 1 tablet by mouth daily.    [provider]  omeprazole (PRILOSEC) 40 MG capsule Take 1 capsule (40 mg total) by mouth daily. Patient not taking: Reported on 04/18/2018 02/22/18   Tysinger, Camelia Eng, PA-C  Potassium 75 MG TABS Take 1 tablet by mouth daily.     [provider]  pravastatin (PRAVACHOL)  20 MG tablet Take 1 tablet (20 mg total) by mouth every evening. Patient not taking: Reported on 04/18/2018 10/06/17 10/06/18  Tysinger, Camelia Eng, PA-C  SUPER B COMPLEX/C PO Take 1 tablet by mouth daily.     [provider]  zinc gluconate 50 MG tablet Take 50 mg by mouth daily.    [provider]    ALLERGY: No Known Allergies  Social History   Tobacco Use  . Smoking status: Never Smoker  . Smokeless tobacco: Never Used  Substance Use Topics  . Alcohol use: Not Currently    Alcohol/week: 32.0 standard drinks    Types: 30 Cans of beer, 2 Shots of liquor per week    Comment: stopped 3 weeks ago 04/12/2018     Family History  Problem Relation Age of Onset  . Hypertension Mother   . Dementia Father   . Parkinsonism Father   . Diabetes Father        early stages  . Cancer Maternal Grandmother        breast  . Heart disease Neg Hx   . Stroke Neg Hx      ROS   ROS  Exam   There were no vitals filed for this visit. General appearance: WDWN, NAD Eyes: No scleral injection Cardiovascular: Regular rate and rhythm without murmurs, rubs, gallops. No edema or variciosities. Distal pulses normal. Pulmonary: Effort normal, non-labored breathing Musculoskeletal:     Muscle tone upper extremities: Normal    Muscle tone lower extremities: Normal    Motor exam: Upper Extremities Deltoid Bicep Tricep Grip  Right 5/5 5/5 5/5 5/5  Left 5/5 5/5 5/5 5/5   Lower Extremity IP Quad PF DF EHL  Right 5/5 5/5 5/5 5/5 5/5  Left 5/5 5/5 5/5 5/5 5/5   Neurological Mental Status:    - Patient is awake, alert, oriented to person, place, month, year, and situation    - Patient is able to give a clear and coherent history.    - No signs of aphasia or neglect Cranial Nerves    - II: Visual Fields are full. PERRL    - III/IV/VI: EOMI without ptosis or diploplia.     - V: Facial sensation is grossly normal    - VII: Facial movement is symmetric.     - VIII: hearing is intact to  voice, decrease R ear    - X: Uvula elevates symmetrically    - XI: Shoulder shrug is symmetric.    - XII: tongue is midline without atrophy or fasciculations.  Sensory: Sensation grossly intact to LT  Results - Imaging/Labs   No results found for this or any previous visit (from the past 48 hour(s)).  No results found.  IMAGING: MRI of the brain with  and without contrast was reviewed.  This demonstrates a right-sided CP angle largely homogeneously enhancing mass measuring approximately 2.9 cm in maximal dimension.  There is small enhancing portion of the tumor within the internal auditory canal.  I do not see evidence of a dural tail.  There is significant compression of the adjacent pons and cerebellar peduncle, with effacement of the 4th ventricle.  There is no surrounding edema.  Impression/Plan   68 y.o. male with large likely acoustic neuroma causing significant brainstem compression.    Based on lesion and symptomatic nature, it was recommended that he undergo a right stereotactic retrosigmoid craniectomy for  Resection of the acoustic neuroma.    While in the office Risks of surgery were reviewed in detail including the risk of brain or brainstem injury possibly leading to weakness, numbness, paralysis, or even coma or death.  We also discussed risk of facial nerve injury leading to facial weakness, and risk of vascular injury leading to stroke.  Risk of CSF leak was also discussed.  We discussed general risks of anesthesia including heart attack stroke, and blood clots.  We did also discuss the possibility of incomplete resection and the possible need for adjuvant treatment including radiation in the future.  Patient and his daughters appeared understand our discussion.  They are ready to proceed with surgery.  All their questions today were answered.

## 2018-05-09 ENCOUNTER — Inpatient Hospital Stay (HOSPITAL_COMMUNITY): Payer: Medicare HMO

## 2018-05-09 ENCOUNTER — Inpatient Hospital Stay (HOSPITAL_COMMUNITY): Payer: Medicare HMO | Admitting: Vascular Surgery

## 2018-05-09 ENCOUNTER — Encounter (HOSPITAL_COMMUNITY): Payer: Self-pay | Admitting: *Deleted

## 2018-05-09 ENCOUNTER — Inpatient Hospital Stay (HOSPITAL_COMMUNITY)
Admission: RE | Admit: 2018-05-09 | Discharge: 2018-05-17 | DRG: 025 | Disposition: A | Discharge status: Home Health | Payer: Medicare HMO | Attending: Neurosurgery | Admitting: Neurosurgery

## 2018-05-09 ENCOUNTER — Encounter (HOSPITAL_COMMUNITY)
Admission: RE | Disposition: A | Discharge status: Home Health | Payer: Self-pay | Source: Home / Self Care | Attending: Neurosurgery

## 2018-05-09 DIAGNOSIS — D3611 Benign neoplasm of peripheral nerves and autonomic nervous system of face, head, and neck: Secondary | ICD-10-CM | POA: Diagnosis not present

## 2018-05-09 DIAGNOSIS — G8929 Other chronic pain: Secondary | ICD-10-CM | POA: Diagnosis not present

## 2018-05-09 DIAGNOSIS — H9191 Unspecified hearing loss, right ear: Secondary | ICD-10-CM | POA: Diagnosis not present

## 2018-05-09 DIAGNOSIS — K219 Gastro-esophageal reflux disease without esophagitis: Secondary | ICD-10-CM | POA: Diagnosis not present

## 2018-05-09 DIAGNOSIS — Z79899 Other long term (current) drug therapy: Secondary | ICD-10-CM | POA: Diagnosis not present

## 2018-05-09 DIAGNOSIS — I614 Nontraumatic intracerebral hemorrhage in cerebellum: Secondary | ICD-10-CM | POA: Diagnosis not present

## 2018-05-09 DIAGNOSIS — I10 Essential (primary) hypertension: Secondary | ICD-10-CM | POA: Diagnosis present

## 2018-05-09 DIAGNOSIS — D333 Benign neoplasm of cranial nerves: Principal | ICD-10-CM | POA: Diagnosis present

## 2018-05-09 DIAGNOSIS — R29810 Facial weakness: Secondary | ICD-10-CM | POA: Diagnosis not present

## 2018-05-09 DIAGNOSIS — G935 Compression of brain: Secondary | ICD-10-CM | POA: Diagnosis not present

## 2018-05-09 DIAGNOSIS — Z8249 Family history of ischemic heart disease and other diseases of the circulatory system: Secondary | ICD-10-CM | POA: Diagnosis not present

## 2018-05-09 DIAGNOSIS — Z452 Encounter for adjustment and management of vascular access device: Secondary | ICD-10-CM

## 2018-05-09 DIAGNOSIS — D496 Neoplasm of unspecified behavior of brain: Secondary | ICD-10-CM | POA: Diagnosis present

## 2018-05-09 HISTORY — PX: CRANIOTOMY: SHX93

## 2018-05-09 HISTORY — PX: APPLICATION OF CRANIAL NAVIGATION: SHX6578

## 2018-05-09 LAB — BASIC METABOLIC PANEL
Anion gap: 10 (ref 5–15)
BUN: 19 mg/dL (ref 8–23)
CO2: 21 mmol/L — ABNORMAL LOW (ref 22–32)
Calcium: 8.8 mg/dL — ABNORMAL LOW (ref 8.9–10.3)
Chloride: 105 mmol/L (ref 98–111)
Creatinine, Ser: 1.15 mg/dL (ref 0.61–1.24)
GFR calc Af Amer: >60 mL/min (ref >60)
GFR calc non Af Amer: >60 mL/min (ref >60)
Glucose, Bld: 100 mg/dL — ABNORMAL HIGH (ref 70–99)
Potassium: 4.3 mmol/L (ref 3.5–5.1)
Sodium: 136 mmol/L (ref 135–145)

## 2018-05-09 SURGERY — CRANIOTOMY TUMOR EXCISION
Anesthesia: General | Site: Head | Laterality: Right

## 2018-05-09 MED ORDER — LIDOCAINE 2% (20 MG/ML) 5 ML SYRINGE
INTRAMUSCULAR | Status: AC
  Filled 2018-05-09: qty 5

## 2018-05-09 MED ORDER — FENTANYL CITRATE (PF) 250 MCG/5ML IJ SOLN
INTRAMUSCULAR | Status: AC
  Filled 2018-05-09: qty 5

## 2018-05-09 MED ORDER — CEFAZOLIN SODIUM-DEXTROSE 2-4 GM/100ML-% IV SOLN
2.0000 g | INTRAVENOUS | Status: AC
  Administered 2018-05-09 (×2): 2 g via INTRAVENOUS

## 2018-05-09 MED ORDER — SODIUM CHLORIDE 0.9 % IV SOLN
INTRAVENOUS | Status: DC | PRN
  Administered 2018-05-09: 08:00:00 via INTRAVENOUS

## 2018-05-09 MED ORDER — BACITRACIN ZINC 500 UNIT/GM EX OINT
TOPICAL_OINTMENT | CUTANEOUS | Status: DC | PRN
  Administered 2018-05-09: 1 via TOPICAL

## 2018-05-09 MED ORDER — FENTANYL CITRATE (PF) 100 MCG/2ML IJ SOLN
INTRAMUSCULAR | Status: DC | PRN
  Administered 2018-05-09 (×2): 50 ug via INTRAVENOUS

## 2018-05-09 MED ORDER — SODIUM CHLORIDE 0.9 % IV SOLN
INTRAVENOUS | Status: DC
  Administered 2018-05-09: 18:00:00 via INTRAVENOUS

## 2018-05-09 MED ORDER — EPHEDRINE 5 MG/ML INJ
INTRAVENOUS | Status: AC
  Filled 2018-05-09: qty 10

## 2018-05-09 MED ORDER — CEFAZOLIN SODIUM 1 G IJ SOLR
INTRAMUSCULAR | Status: AC
  Filled 2018-05-09: qty 20

## 2018-05-09 MED ORDER — LABETALOL HCL 5 MG/ML IV SOLN
INTRAVENOUS | Status: AC
  Filled 2018-05-09: qty 4

## 2018-05-09 MED ORDER — ACETAMINOPHEN 325 MG PO TABS
650.0000 mg | ORAL_TABLET | ORAL | Status: DC | PRN
  Administered 2018-05-15: 650 mg via ORAL
  Filled 2018-05-09: qty 2

## 2018-05-09 MED ORDER — PHENYLEPHRINE 40 MCG/ML (10ML) SYRINGE FOR IV PUSH (FOR BLOOD PRESSURE SUPPORT)
PREFILLED_SYRINGE | INTRAVENOUS | Status: DC | PRN
  Administered 2018-05-09: 80 ug via INTRAVENOUS

## 2018-05-09 MED ORDER — MAGNESIUM OXIDE 400 (241.3 MG) MG PO TABS
400.0000 mg | ORAL_TABLET | Freq: Two times a day (BID) | ORAL | Status: DC
  Administered 2018-05-09 – 2018-05-17 (×13): 400 mg via ORAL
  Filled 2018-05-09 (×16): qty 1

## 2018-05-09 MED ORDER — THROMBIN 5000 UNITS EX SOLR
CUTANEOUS | Status: AC
  Filled 2018-05-09: qty 10000

## 2018-05-09 MED ORDER — CEFAZOLIN SODIUM-DEXTROSE 2-4 GM/100ML-% IV SOLN
INTRAVENOUS | Status: AC
  Filled 2018-05-09: qty 100

## 2018-05-09 MED ORDER — LISINOPRIL 20 MG PO TABS
20.0000 mg | ORAL_TABLET | Freq: Every day | ORAL | Status: DC
  Administered 2018-05-10 – 2018-05-17 (×8): 20 mg via ORAL
  Filled 2018-05-09 (×8): qty 1

## 2018-05-09 MED ORDER — PROPOFOL 10 MG/ML IV BOLUS
INTRAVENOUS | Status: AC
  Filled 2018-05-09: qty 40

## 2018-05-09 MED ORDER — FENTANYL CITRATE (PF) 100 MCG/2ML IJ SOLN
INTRAMUSCULAR | Status: AC
  Filled 2018-05-09: qty 2

## 2018-05-09 MED ORDER — BACITRACIN ZINC 500 UNIT/GM EX OINT
TOPICAL_OINTMENT | CUTANEOUS | Status: AC
  Filled 2018-05-09: qty 28.35

## 2018-05-09 MED ORDER — ONDANSETRON HCL 4 MG/2ML IJ SOLN
INTRAMUSCULAR | Status: DC | PRN
  Administered 2018-05-09: 4 mg via INTRAVENOUS

## 2018-05-09 MED ORDER — CHLORHEXIDINE GLUCONATE CLOTH 2 % EX PADS: 6.0000 | MEDICATED_PAD | Freq: Once | CUTANEOUS | Status: DC

## 2018-05-09 MED ORDER — BUPIVACAINE HCL (PF) 0.5 % IJ SOLN
INTRAMUSCULAR | Status: DC | PRN
  Administered 2018-05-09: 5 mL

## 2018-05-09 MED ORDER — ROCURONIUM BROMIDE 50 MG/5ML IV SOSY
PREFILLED_SYRINGE | INTRAVENOUS | Status: DC | PRN
  Administered 2018-05-09: 40 mg via INTRAVENOUS

## 2018-05-09 MED ORDER — LIDOCAINE 2% (20 MG/ML) 5 ML SYRINGE
INTRAMUSCULAR | Status: DC | PRN
  Administered 2018-05-09: 80 mg via INTRAVENOUS

## 2018-05-09 MED ORDER — CEFAZOLIN SODIUM-DEXTROSE 2-4 GM/100ML-% IV SOLN
2.0000 g | Freq: Three times a day (TID) | INTRAVENOUS | Status: AC
  Administered 2018-05-09 – 2018-05-10 (×2): 2 g via INTRAVENOUS
  Filled 2018-05-09 (×2): qty 100

## 2018-05-09 MED ORDER — FLEET ENEMA 7-19 GM/118ML RE ENEM: 1.0000 | ENEMA | Freq: Once | RECTAL | Status: DC | PRN

## 2018-05-09 MED ORDER — THROMBIN 20000 UNITS EX SOLR
CUTANEOUS | Status: AC
  Filled 2018-05-09: qty 20000

## 2018-05-09 MED ORDER — THROMBIN 20000 UNITS EX SOLR
CUTANEOUS | Status: DC | PRN
  Administered 2018-05-09 (×2): via TOPICAL

## 2018-05-09 MED ORDER — FENTANYL CITRATE (PF) 100 MCG/2ML IJ SOLN
25.0000 ug | INTRAMUSCULAR | Status: DC | PRN
  Administered 2018-05-09 (×3): 50 ug via INTRAVENOUS

## 2018-05-09 MED ORDER — THROMBIN 5000 UNITS EX SOLR
OROMUCOSAL | Status: DC | PRN
  Administered 2018-05-09: 11:00:00 via TOPICAL

## 2018-05-09 MED ORDER — 0.9 % SODIUM CHLORIDE (POUR BTL) OPTIME
TOPICAL | Status: DC | PRN
  Administered 2018-05-09 (×2): 1000 mL

## 2018-05-09 MED ORDER — PROPOFOL 1000 MG/100ML IV EMUL
INTRAVENOUS | Status: AC
  Filled 2018-05-09: qty 100

## 2018-05-09 MED ORDER — DEXAMETHASONE SODIUM PHOSPHATE 10 MG/ML IJ SOLN
INTRAMUSCULAR | Status: AC
  Filled 2018-05-09: qty 1

## 2018-05-09 MED ORDER — ROCURONIUM BROMIDE 50 MG/5ML IV SOSY
PREFILLED_SYRINGE | INTRAVENOUS | Status: AC
  Filled 2018-05-09: qty 5

## 2018-05-09 MED ORDER — MIDAZOLAM HCL 5 MG/5ML IJ SOLN
INTRAMUSCULAR | Status: DC | PRN
  Administered 2018-05-09: 2 mg via INTRAVENOUS

## 2018-05-09 MED ORDER — NICARDIPINE HCL IN NACL 20-0.86 MG/200ML-% IV SOLN
5.0000 mg/h | INTRAVENOUS | Status: DC
  Administered 2018-05-09 – 2018-05-10 (×3): 5 mg/h via INTRAVENOUS
  Filled 2018-05-09 (×3): qty 200

## 2018-05-09 MED ORDER — PROPOFOL 10 MG/ML IV BOLUS
INTRAVENOUS | Status: DC | PRN
  Administered 2018-05-09: 50 mg via INTRAVENOUS
  Administered 2018-05-09: 100 mg via INTRAVENOUS

## 2018-05-09 MED ORDER — SODIUM CHLORIDE 0.9 % IV SOLN
0.0500 ug/kg/min | INTRAVENOUS | Status: AC
  Administered 2018-05-09: 0.05 ug/kg/min via INTRAVENOUS
  Filled 2018-05-09: qty 5000

## 2018-05-09 MED ORDER — MANNITOL 25 % IV SOLN
INTRAVENOUS | Status: DC | PRN
  Administered 2018-05-09: 25 g via INTRAVENOUS

## 2018-05-09 MED ORDER — BISACODYL 5 MG PO TBEC
5.0000 mg | DELAYED_RELEASE_TABLET | Freq: Every day | ORAL | Status: DC | PRN
  Administered 2018-05-16: 5 mg via ORAL
  Filled 2018-05-09: qty 1

## 2018-05-09 MED ORDER — ADULT MULTIVITAMIN W/MINERALS CH
1.0000 | ORAL_TABLET | Freq: Every day | ORAL | Status: DC
  Administered 2018-05-10 – 2018-05-17 (×7): 1 via ORAL
  Filled 2018-05-09 (×8): qty 1

## 2018-05-09 MED ORDER — DEXAMETHASONE SODIUM PHOSPHATE 10 MG/ML IJ SOLN
INTRAMUSCULAR | Status: DC | PRN
  Administered 2018-05-09: 10 mg via INTRAVENOUS

## 2018-05-09 MED ORDER — LIDOCAINE-EPINEPHRINE 1 %-1:100000 IJ SOLN
INTRAMUSCULAR | Status: AC
  Filled 2018-05-09: qty 1

## 2018-05-09 MED ORDER — EPHEDRINE SULFATE-NACL 50-0.9 MG/10ML-% IV SOSY
PREFILLED_SYRINGE | INTRAVENOUS | Status: DC | PRN
  Administered 2018-05-09 (×3): 5 mg via INTRAVENOUS

## 2018-05-09 MED ORDER — SODIUM CHLORIDE 0.9 % IV SOLN
0.0125 ug/kg/min | INTRAVENOUS | Status: DC
  Filled 2018-05-09: qty 2000

## 2018-05-09 MED ORDER — DOCUSATE SODIUM 100 MG PO CAPS
100.0000 mg | ORAL_CAPSULE | Freq: Two times a day (BID) | ORAL | Status: DC
  Administered 2018-05-09 – 2018-05-17 (×13): 100 mg via ORAL
  Filled 2018-05-09 (×16): qty 1

## 2018-05-09 MED ORDER — ONDANSETRON HCL 4 MG/2ML IJ SOLN
4.0000 mg | INTRAMUSCULAR | Status: DC | PRN
  Administered 2018-05-09 – 2018-05-13 (×7): 4 mg via INTRAVENOUS
  Filled 2018-05-09 (×7): qty 2

## 2018-05-09 MED ORDER — HYDROMORPHONE HCL 1 MG/ML IJ SOLN
0.5000 mg | INTRAMUSCULAR | Status: DC | PRN
  Administered 2018-05-09 – 2018-05-12 (×11): 1 mg via INTRAVENOUS
  Administered 2018-05-12: 0.5 mg via INTRAVENOUS
  Administered 2018-05-12 (×2): 1 mg via INTRAVENOUS
  Administered 2018-05-12: 0.5 mg via INTRAVENOUS
  Administered 2018-05-13: 1 mg via INTRAVENOUS
  Administered 2018-05-13: 0.5 mg via INTRAVENOUS
  Administered 2018-05-13 – 2018-05-15 (×7): 1 mg via INTRAVENOUS
  Administered 2018-05-16: 0.5 mg via INTRAVENOUS
  Filled 2018-05-09 (×25): qty 1

## 2018-05-09 MED ORDER — SODIUM CHLORIDE 0.9 % IV SOLN
INTRAVENOUS | Status: DC | PRN
  Administered 2018-05-09: 2 ug/min via INTRAVENOUS

## 2018-05-09 MED ORDER — ACETAMINOPHEN 650 MG RE SUPP: 650.0000 mg | RECTAL | Status: DC | PRN

## 2018-05-09 MED ORDER — PANTOPRAZOLE SODIUM 40 MG IV SOLR
40.0000 mg | Freq: Every day | INTRAVENOUS | Status: DC
  Administered 2018-05-09 – 2018-05-11 (×3): 40 mg via INTRAVENOUS
  Filled 2018-05-09 (×3): qty 40

## 2018-05-09 MED ORDER — PROMETHAZINE HCL 25 MG PO TABS: 12.5000 mg | ORAL_TABLET | ORAL | Status: DC | PRN

## 2018-05-09 MED ORDER — PHENYLEPHRINE 40 MCG/ML (10ML) SYRINGE FOR IV PUSH (FOR BLOOD PRESSURE SUPPORT)
PREFILLED_SYRINGE | INTRAVENOUS | Status: AC
  Filled 2018-05-09: qty 10

## 2018-05-09 MED ORDER — NALOXONE HCL 0.4 MG/ML IJ SOLN: 0.0800 mg | INTRAMUSCULAR | Status: DC | PRN

## 2018-05-09 MED ORDER — MIDAZOLAM HCL 2 MG/2ML IJ SOLN
INTRAMUSCULAR | Status: AC
  Filled 2018-05-09: qty 2

## 2018-05-09 MED ORDER — METOPROLOL TARTRATE 50 MG PO TABS
100.0000 mg | ORAL_TABLET | Freq: Two times a day (BID) | ORAL | Status: DC
  Administered 2018-05-09 – 2018-05-17 (×15): 100 mg via ORAL
  Filled 2018-05-09 (×16): qty 2

## 2018-05-09 MED ORDER — SODIUM CHLORIDE 0.9 % IR SOLN
Status: DC | PRN
  Administered 2018-05-09: 1000 mL

## 2018-05-09 MED ORDER — BUPIVACAINE HCL (PF) 0.5 % IJ SOLN
INTRAMUSCULAR | Status: AC
  Filled 2018-05-09: qty 30

## 2018-05-09 MED ORDER — LIDOCAINE-EPINEPHRINE 1 %-1:100000 IJ SOLN
INTRAMUSCULAR | Status: DC | PRN
  Administered 2018-05-09: 5 mL

## 2018-05-09 MED ORDER — ONDANSETRON HCL 4 MG PO TABS
4.0000 mg | ORAL_TABLET | ORAL | Status: DC | PRN
  Administered 2018-05-14: 4 mg via ORAL
  Filled 2018-05-09: qty 1

## 2018-05-09 MED ORDER — HYDROCODONE-ACETAMINOPHEN 5-325 MG PO TABS
1.0000 | ORAL_TABLET | ORAL | Status: DC | PRN
  Administered 2018-05-09 – 2018-05-17 (×24): 1 via ORAL
  Filled 2018-05-09 (×24): qty 1

## 2018-05-09 MED ORDER — LABETALOL HCL 5 MG/ML IV SOLN
10.0000 mg | INTRAVENOUS | Status: DC | PRN
  Administered 2018-05-09 (×3): 10 mg via INTRAVENOUS
  Filled 2018-05-09: qty 4

## 2018-05-09 MED ORDER — HEMOSTATIC AGENTS (NO CHARGE) OPTIME
TOPICAL | Status: DC | PRN
  Administered 2018-05-09 (×2): 1 via TOPICAL

## 2018-05-09 MED ORDER — SENNOSIDES-DOCUSATE SODIUM 8.6-50 MG PO TABS: 1.0000 | ORAL_TABLET | Freq: Every evening | ORAL | Status: DC | PRN

## 2018-05-09 MED ORDER — DEXAMETHASONE SODIUM PHOSPHATE 4 MG/ML IJ SOLN
4.0000 mg | Freq: Four times a day (QID) | INTRAMUSCULAR | Status: DC
  Administered 2018-05-09 – 2018-05-12 (×10): 4 mg via INTRAVENOUS
  Filled 2018-05-09 (×10): qty 1

## 2018-05-09 MED ORDER — ONDANSETRON HCL 4 MG/2ML IJ SOLN: 4.0000 mg | Freq: Once | INTRAMUSCULAR | Status: DC | PRN

## 2018-05-09 SURGICAL SUPPLY — 123 items
ADH SKN CLS APL DERMABOND .7 (GAUZE/BANDAGES/DRESSINGS) ×1
APL SKNCLS STERI-STRIP NONHPOA (GAUZE/BANDAGES/DRESSINGS)
BENZOIN TINCTURE PRP APPL 2/3 (GAUZE/BANDAGES/DRESSINGS) IMPLANT
BLADE CLIPPER SURG (BLADE) ×1 IMPLANT
BLADE SAW GIGLI 16 STRL (MISCELLANEOUS) IMPLANT
BLADE SURG 15 STRL LF DISP TIS (BLADE) IMPLANT
BLADE SURG 15 STRL SS (BLADE)
BLADE ULTRA TIP 2M (BLADE) ×1 IMPLANT
BNDG CMPR 75X41 PLY HI ABS (GAUZE/BANDAGES/DRESSINGS)
BNDG GAUZE ELAST 4 BULKY (GAUZE/BANDAGES/DRESSINGS) IMPLANT
BNDG STRETCH 4X75 STRL LF (GAUZE/BANDAGES/DRESSINGS) IMPLANT
BUR ACORN 6.0 PRECISION (BURR) ×2 IMPLANT
BUR ROUND FLUTED 4 SOFT TCH (BURR) ×1 IMPLANT
BUR SPIRAL ROUTER 2.3 (BUR) ×2 IMPLANT
CANISTER SUCT 3000ML PPV (MISCELLANEOUS) ×4 IMPLANT
CARTRIDGE OIL MAESTRO DRILL (MISCELLANEOUS) ×1 IMPLANT
CATH VENTRIC 35X38 W/TROCAR LG (CATHETERS) IMPLANT
CLIP VESOCCLUDE MED 6/CT (CLIP) IMPLANT
CONT SPEC 4OZ CLIKSEAL STRL BL (MISCELLANEOUS) ×3 IMPLANT
COVER MAYO STAND STRL (DRAPES) ×2 IMPLANT
COVER WAND RF STERILE (DRAPES) ×1 IMPLANT
DECANTER SPIKE VIAL GLASS SM (MISCELLANEOUS) ×2 IMPLANT
DERMABOND ADVANCED (GAUZE/BANDAGES/DRESSINGS) ×1
DERMABOND ADVANCED .7 DNX12 (GAUZE/BANDAGES/DRESSINGS) IMPLANT
DIFFUSER DRILL AIR PNEUMATIC (MISCELLANEOUS) ×2 IMPLANT
DRAIN SUBARACHNOID (WOUND CARE) IMPLANT
DRAPE HALF SHEET 40X57 (DRAPES) ×1 IMPLANT
DRAPE MICROSCOPE LEICA (MISCELLANEOUS) ×1 IMPLANT
DRAPE NEUROLOGICAL W/INCISE (DRAPES) ×2 IMPLANT
DRAPE STERI IOBAN 125X83 (DRAPES) IMPLANT
DRAPE SURG 17X23 STRL (DRAPES) IMPLANT
DRAPE WARM FLUID 44X44 (DRAPE) ×2 IMPLANT
DRSG ADAPTIC 3X8 NADH LF (GAUZE/BANDAGES/DRESSINGS) IMPLANT
DRSG EMULSION OIL 3X3 NADH (GAUZE/BANDAGES/DRESSINGS) ×1 IMPLANT
DRSG OPSITE POSTOP 4X6 (GAUZE/BANDAGES/DRESSINGS) ×1 IMPLANT
DRSG TELFA 3X8 NADH (GAUZE/BANDAGES/DRESSINGS) IMPLANT
DURAMATRIX ONLAY 2X2 (Neuro Prosthesis/Implant) ×2 IMPLANT
DURAPREP 6ML APPLICATOR 50/CS (WOUND CARE) ×2 IMPLANT
ELECT REM PT RETURN 9FT ADLT (ELECTROSURGICAL) ×2
ELECTRODE REM PT RTRN 9FT ADLT (ELECTROSURGICAL) ×1 IMPLANT
EVACUATOR 1/8 PVC DRAIN (DRAIN) IMPLANT
EVACUATOR SILICONE 100CC (DRAIN) IMPLANT
FEE INTRAOP MONITOR IMPULS NCS (MISCELLANEOUS) IMPLANT
FORCEPS BIPOLAR SPETZLER 8 1.0 (NEUROSURGERY SUPPLIES) ×2 IMPLANT
GAUZE 4X4 16PLY RFD (DISPOSABLE) IMPLANT
GAUZE SPONGE 4X4 12PLY STRL (GAUZE/BANDAGES/DRESSINGS) ×1 IMPLANT
GLOVE BIO SURGEON STRL SZ 6.5 (GLOVE) ×5 IMPLANT
GLOVE BIO SURGEON STRL SZ7 (GLOVE) IMPLANT
GLOVE BIO SURGEON STRL SZ7.5 (GLOVE) ×1 IMPLANT
GLOVE BIO SURGEON STRL SZ8 (GLOVE) ×1 IMPLANT
GLOVE BIOGEL PI IND STRL 6.5 (GLOVE) IMPLANT
GLOVE BIOGEL PI IND STRL 7.0 (GLOVE) IMPLANT
GLOVE BIOGEL PI IND STRL 7.5 (GLOVE) ×1 IMPLANT
GLOVE BIOGEL PI IND STRL 8.5 (GLOVE) IMPLANT
GLOVE BIOGEL PI INDICATOR 6.5 (GLOVE) ×4
GLOVE BIOGEL PI INDICATOR 7.0 (GLOVE)
GLOVE BIOGEL PI INDICATOR 7.5 (GLOVE) ×3
GLOVE BIOGEL PI INDICATOR 8.5 (GLOVE) ×1
GLOVE ECLIPSE 7.0 STRL STRAW (GLOVE) ×5 IMPLANT
GLOVE EXAM NITRILE XL STR (GLOVE) IMPLANT
GLOVE SS BIOGEL STRL SZ 6.5 (GLOVE) IMPLANT
GLOVE SS BIOGEL STRL SZ 7 (GLOVE) IMPLANT
GLOVE SUPERSENSE BIOGEL SZ 6.5 (GLOVE) ×1
GLOVE SUPERSENSE BIOGEL SZ 7 (GLOVE) ×1
GOWN STRL REUS W/ TWL LRG LVL3 (GOWN DISPOSABLE) ×2 IMPLANT
GOWN STRL REUS W/ TWL XL LVL3 (GOWN DISPOSABLE) IMPLANT
GOWN STRL REUS W/TWL 2XL LVL3 (GOWN DISPOSABLE) IMPLANT
GOWN STRL REUS W/TWL LRG LVL3 (GOWN DISPOSABLE) ×12
GOWN STRL REUS W/TWL XL LVL3 (GOWN DISPOSABLE) ×4
HEMOSTAT POWDER KIT SURGIFOAM (HEMOSTASIS) ×2 IMPLANT
HEMOSTAT SURGICEL 2X14 (HEMOSTASIS) ×2 IMPLANT
HOOK DURA 1/2IN (MISCELLANEOUS) ×2 IMPLANT
INTRAOP MONITOR FEE IMPULS NCS (MISCELLANEOUS) ×1
INTRAOP MONITOR FEE IMPULSE (MISCELLANEOUS) ×1
IV NS 1000ML (IV SOLUTION) ×2
IV NS 1000ML BAXH (IV SOLUTION) ×1 IMPLANT
KIT BASIN OR (CUSTOM PROCEDURE TRAY) ×2 IMPLANT
KIT DRAIN CSF ACCUDRAIN (MISCELLANEOUS) IMPLANT
KIT TURNOVER KIT B (KITS) ×2 IMPLANT
KNIFE ARACHNOID DISP AM-24-S (MISCELLANEOUS) ×2 IMPLANT
MARKER SPHERE PSV REFLC 13MM (MARKER) ×5 IMPLANT
NDL SPNL 18GX3.5 QUINCKE PK (NEEDLE) IMPLANT
NEEDLE HYPO 22GX1.5 SAFETY (NEEDLE) ×2 IMPLANT
NEEDLE SPNL 18GX3.5 QUINCKE PK (NEEDLE) IMPLANT
NS IRRIG 1000ML POUR BTL (IV SOLUTION) ×5 IMPLANT
OIL CARTRIDGE MAESTRO DRILL (MISCELLANEOUS) ×2
PACK CRANIOTOMY CUSTOM (CUSTOM PROCEDURE TRAY) ×2 IMPLANT
PAD DRESSING TELFA 3X8 NADH (GAUZE/BANDAGES/DRESSINGS) IMPLANT
PATTIES SURGICAL .25X.25 (GAUZE/BANDAGES/DRESSINGS) IMPLANT
PATTIES SURGICAL .5 X.5 (GAUZE/BANDAGES/DRESSINGS) ×2 IMPLANT
PATTIES SURGICAL .5 X3 (DISPOSABLE) IMPLANT
PATTIES SURGICAL 1/4 X 3 (GAUZE/BANDAGES/DRESSINGS) IMPLANT
PATTIES SURGICAL 1X1 (DISPOSABLE) IMPLANT
PIN MAYFIELD SKULL DISP (PIN) ×2 IMPLANT
PLATE 1.5/0.6 85X53M SM PANEL (Plate) ×1 IMPLANT
PROBE MONOPOLAR STIMULATOR (NEUROSURGERY SUPPLIES) ×1 IMPLANT
RUBBERBAND STERILE (MISCELLANEOUS) ×2 IMPLANT
SCREW SELF DRILL HT 1.5/4MM (Screw) ×7 IMPLANT
SEALANT ADHERUS EXTEND TIP (MISCELLANEOUS) ×1 IMPLANT
SET TUBING W/EXT DISP (INSTRUMENTS) ×1 IMPLANT
SPECIMEN JAR SMALL (MISCELLANEOUS) IMPLANT
SPONGE NEURO XRAY DETECT 1X3 (DISPOSABLE) IMPLANT
SPONGE SURGIFOAM ABS GEL 100 (HEMOSTASIS) ×3 IMPLANT
STAPLER VISISTAT 35W (STAPLE) ×2 IMPLANT
STOCKINETTE 6  STRL (DRAPES) ×1
STOCKINETTE 6 STRL (DRAPES) IMPLANT
SUT ETHILON 3 0 FSL (SUTURE) IMPLANT
SUT ETHILON 3 0 PS 1 (SUTURE) IMPLANT
SUT NURALON 4 0 TR CR/8 (SUTURE) ×4 IMPLANT
SUT SILK 0 TIES 10X30 (SUTURE) IMPLANT
SUT VIC AB 0 CT1 18XCR BRD8 (SUTURE) ×2 IMPLANT
SUT VIC AB 0 CT1 8-18 (SUTURE) ×6
SUT VIC AB 3-0 SH 8-18 (SUTURE) ×5 IMPLANT
SUT VICRYL 3-0 RB1 18 ABS (SUTURE) ×3 IMPLANT
TAPE CLOTH 1X10 TAN NS (GAUZE/BANDAGES/DRESSINGS) ×1 IMPLANT
TIP STRAIGHT 25KHZ (INSTRUMENTS) ×1 IMPLANT
TOWEL GREEN STERILE (TOWEL DISPOSABLE) ×2 IMPLANT
TOWEL GREEN STERILE FF (TOWEL DISPOSABLE) ×2 IMPLANT
TRAY FOLEY MTR SLVR 16FR STAT (SET/KITS/TRAYS/PACK) ×2 IMPLANT
TUBE CONNECTING 12X1/4 (SUCTIONS) ×1 IMPLANT
TUBE CONNECTING 20X1/4 (TUBING) ×1 IMPLANT
UNDERPAD 30X30 (UNDERPADS AND DIAPERS) ×1 IMPLANT
WATER STERILE IRR 1000ML POUR (IV SOLUTION) ×2 IMPLANT

## 2018-05-09 NOTE — Anesthesia Procedure Notes (Signed)
Central Venous Catheter Insertion Performed by: Suzette Battiest, MD, anesthesiologist Start/End2/25/2020 8:10 AM, 05/09/2018 8:17 AM Patient location: Pre-op. Preanesthetic checklist: patient identified, IV checked, site marked, risks and benefits discussed, surgical consent, monitors and equipment checked, pre-op evaluation, timeout performed and anesthesia consent Position: Trendelenburg Lidocaine 1% used for infiltration and patient sedated Hand hygiene performed , maximum sterile barriers used  and Seldinger technique used Catheter size: 8 Fr Total catheter length 16. Central line was placed.Double lumen Procedure performed using ultrasound guided technique. Ultrasound Notes:anatomy identified, needle tip was noted to be adjacent to the nerve/plexus identified, no ultrasound evidence of intravascular and/or intraneural injection and image(s) printed for medical record Attempts: 1 Following insertion, dressing applied, line sutured and Biopatch. Post procedure assessment: blood return through all ports  Patient tolerated the procedure well with no immediate complications.

## 2018-05-09 NOTE — Transfer of Care (Signed)
Immediate Anesthesia Transfer of Care Note  Patient: Logan Phillips  Procedure(s) Performed: Right retrosigmoid craniectomy, resection of acoustic neuroma with intraoperative facial monitoring/Brain Lab (Right Head) APPLICATION OF CRANIAL NAVIGATION (Right Head)  Patient Location: PACU  Anesthesia Type:General  Level of Consciousness: awake, alert  and patient cooperative; patient can follow all commands and knows his name and birthday.  Knows he is here for surgery, but when asked the year and the president he says he will "think of it later". Slight facial droop which Dr. Kathyrn Sheriff has seen and is aware of   Airway & Oxygen Therapy: Patient Spontanous Breathing and Patient connected to nasal cannula oxygen  Post-op Assessment: Report given to RN and Post -op Vital signs reviewed and stable  Post vital signs: Reviewed and stable  Last Vitals:  Vitals Value Taken Time  BP 172/95 05/09/2018  3:07 PM  Temp    Pulse 91 05/09/2018  3:19 PM  Resp 16 05/09/2018  3:19 PM  SpO2 99 % 05/09/2018  3:19 PM  Vitals shown include unvalidated device data.  Last Pain:  Vitals:   05/09/18 0624  TempSrc:   PainSc: 0-No pain         Complications: No apparent anesthesia complications

## 2018-05-09 NOTE — Progress Notes (Signed)
Pt c/o of headache, PRN dilaudid given. Within 15 min pts speech became slightly more slurred. Family at bedside informed me this happened earlier when given the same medication. Pt had been complaining of facial numbness with a slight droop since the procedure earlier today. I was informed that Dr. Kathyrn Sheriff was aware.  Paged Vertell Limber to inform him of this change. Vertell Limber stated this is a normal finding with this procedure in regards to the nerves and the slurred speech is likely made worse by the PRN dilaudid. No actions taken at this time.

## 2018-05-09 NOTE — Anesthesia Procedure Notes (Signed)
Arterial Line Insertion Start/End2/25/2020 6:55 AM Performed by: Shirlyn Goltz, CRNA, CRNA  Patient location: Pre-op. Preanesthetic checklist: patient identified, IV checked, site marked, risks and benefits discussed, surgical consent, monitors and equipment checked, pre-op evaluation, timeout performed and anesthesia consent Lidocaine 1% used for infiltration Left, radial was placed Catheter size: 18 G Hand hygiene performed  and maximum sterile barriers used  Allen's test indicative of satisfactory collateral circulation Attempts: 1 Procedure performed without using ultrasound guided technique. Ultrasound Notes:anatomy identified Following insertion, Biopatch and dressing applied. Post procedure assessment: normal  Patient tolerated the procedure well with no immediate complications. Additional procedure comments: Placed by SRNA A. Cosgrove.

## 2018-05-09 NOTE — Anesthesia Procedure Notes (Signed)
Procedure Name: Intubation Date/Time: 05/09/2018 7:54 AM Performed by: Shirlyn Goltz, CRNA Pre-anesthesia Checklist: Patient identified, Emergency Drugs available, Suction available and Patient being monitored Patient Re-evaluated:Patient Re-evaluated prior to induction Oxygen Delivery Method: Circle system utilized Preoxygenation: Pre-oxygenation with 100% oxygen Induction Type: IV induction Ventilation: Mask ventilation without difficulty Laryngoscope Size: Mac and 4 Grade View: Grade I Tube type: Oral Tube size: 7.5 mm Number of attempts: 2 (Attempted by SRNA A. Cosgrove with miller blade and Mac blade; placed by CRNA Letitia Libra D. with MAC 4 blade) Airway Equipment and Method: Stylet and Oral airway Placement Confirmation: ETT inserted through vocal cords under direct vision,  positive ETCO2 and breath sounds checked- equal and bilateral Secured at: 22 cm Tube secured with: Tape Dental Injury: Teeth and Oropharynx as per pre-operative assessment

## 2018-05-09 NOTE — Progress Notes (Signed)
Pt had tremor of both upper extremities on adm to PACU, R>L.  After med with IV Fentanyl for head/neck pain, tremors gone. Dr Kathyrn Sheriff was here and aware.

## 2018-05-09 NOTE — Op Note (Signed)
NEUROSURGERY OPERATIVE NOTE   PREOP DIAGNOSIS:  1. Right cerebello-pontine angle tumor   POSTOP DIAGNOSIS: Same  PROCEDURE: 1. 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 Use of intraoperative cranial nerve monitoring 3. Use of intraoperative microscope for microdissection  SURGEON: Dr. Consuella Lose, MD  ASSISTANT: Dr. Erline Levine, MD  ANESTHESIA: General Endotracheal  EBL: 250cc  SPECIMENS: Right CP angle tumor for permanent pathology  DRAINS: None  COMPLICATIONS: None immediate  CONDITION: Hemodynamically stable to postanesthesia care unit  HISTORY: Logan Phillips is a 68 y.o. male initially seen in inpatient consultation during admission for unrelated colitis.  He was at the time also complaining of several week history of worsening gait instability and dizziness.  Work-up included CT scan and MRI which demonstrated a large right-sided cerebellopontine angle tumor.  After he was treated for his colitis and discharged, he was seen in the outpatient neurosurgery clinic.  Treatment options for the tumor were reviewed in detail with the patient and his daughters.  Given the size of the tumor with brainstem compression, surgical resection was recommended.  The risks and benefits of the surgery were reviewed in detail with the patient and his family.  After all questions were answered informed consent was obtained and witnessed.  PROCEDURE IN DETAIL: The patient was brought to the operating room.  Patient underwent placement of arterial line and central venous catheter placement.  After induction of general anesthesia, the patient was positioned on the operative table in the supine position in the [place] head holder.  A right-sided shoulder bump was placed, and the head was turned to the left in order to expose the right retrosigmoid region. All pressure points were meticulously padded.  Using the preoperative stereotactic CT scan which was  fused to the preoperative MRI scan, surface markers were correct to start until a satisfactory accuracy was achieved.  I then used the stereotactic system to mark out the surface projection of the transverse and sigmoid sinuses.  A curvilinear skin incision was then marked out and prepped and draped in the usual sterile fashion.  After timeout was conducted, the skin incision was infiltrated with local anesthetic with epinephrine.  Incision was then made sharply and carried down through subcutaneous tissue.  Raney clips were applied for hemostasis on the skin edges.  The suboccipital fascia was then identified and incised.  The sternocleidomastoid muscle was identified and divided.  Digastric muscle was also identified and divided.  Periosteum overlying the occipital bone was then incised, and subperiosteal dissection was carried out to expose the mastoid process.  Self-retaining retractor was then placed.  Stereotactic system was used to identify the transverse and sigmoid sinuses, and ensure that we had adequate exposure for resection of the tumor.  Using the high-speed drill, a bur hole was created at the transverse sigmoid junction.  The craniotome was then used to fashion a craniectomy flap.  This was then elevated.  High-speed drill was then used to extend the craniectomy in order to identify the margin of the transverse sinus, transverse sigmoid junction, and the sigmoid sinus.  Bone removal was carried inferiorly until the skull base just lateral to the foramen magnum was identified.  Hemostasis was secured on the epidural surface with a combination of bipolar electrocautery and morselized Gelfoam and thrombin.  At this point the dura was opened in cruciate fashion based along the transverse and sigmoid sinuses, and dural leaflets were tacked up with 4-0 Nurolon stitches.  The microscope was  then draped sterilely and brought into the field and the remainder of the case was done under the microscope  using microdissection.  Initially, attention was turned to identification of the cistern of the cisterna magna.  Arachnoid was opened to allow good egress of CSF.  This allowed significant amount of cerebellar relaxation.  Attention was then turned to dissection slightly more superiorly.  The lower cranial nerves including IX, X, and XI were identified in their cisternal segment coursing into the jugular foramen.  The inferior margin of the tumor was identified just rostral to the glossopharyngeal nerve.  Attention was then turned superiorly to identify the superior margin of the tumor just inferior to the tentorium.  At this point I was not clearly able to identify the trigeminal nerve nor the facial nerve.  Using direct stimulation, the lateral surface of the tumor was stimulated at 1 and 2 mA.  There was no facial activity noted.  There was a arterial vessel overlying the lateral surface of the tumor which was meticulously dissected and reflected posteriorly.  This likely was the anterior inferior cerebellar artery.  The lateral surface of the tumor was then coagulated with bipolar, and using the ultrasonic aspirator, the tumor was debulked.  This allowed further dissection of the arachnoid plane inferiorly and superiorly.  This allowed identification of the trigeminal nerve superiorly which did appear to be somewhat thinned and displaced superiorly.  Further internal debulking with the ultrasonic aspirator allowed dissection away from the cerebellum and pons.  In this manner, alternating between internal debulking and arachnoid dissection around the periphery of the tumor, I was able to identify the origin of the facial nerve exiting the pons.  The facial nerve was displaced medially and on the superior half of the tumor.  Finally, after significant internal debulking, I was able to remove the portion of the tumor within the posterior fossa, truncated at the level of the internal auditory canal.  At this  point, direct stimulation was used to verify the integrity of the facial nerve.  It was seen to be significantly adherent to the medial portion of the tumor capsule.  I was able to dissect a portion of the cisternal segment away from the tumor, however as the facial nerve approached the internal auditory canal, it was noted to be significantly splayed, nearly translucent, and I was not able to safely dissect the tumor away from the facial nerve without in my opinion significant risk of facial injury or transection.  At this point I did use direct stimulation on the remaining tumor along the distal cisternal portion of the facial nerve and did obtain facial responses from the remaining tumor and nerve.  I did also stimulate the portion of the tumor extending into the internal auditory canal and again obtained facial responses.  Because I was unable to safely dissect a portion of the tumor away from the distal cisternal segment of the 7th nerve, I elected not to proceed with drilling of the internal auditory canal in order to remove the small portion within the canal as there was small tumor residual left on the facial nerve anyway.  At this point the field was irrigated with a copious amount of normal saline irrigation.  There was no active bleeding from the cerebellum or brainstem.  There is no active tumor bleeding.  The microscope was then removed, and a dural inlay graft was placed.  Dural leaflets were then reapproximated with interrupted 4-0 Nurolon stitches.  Dural onlay graft  was placed.  This was covered with a layer of polyethylene glycol sealant.  Titanium mesh was then cut to size to fashion a cranioplasty.  This was secured with standard titanium plates and screws.  The muscle was then reapproximated with interrupted 0 Vicryl stitches.  The fascia was reapproximated with interrupted 0 Vicryl stitches and the skin was closed with interrupted 3-0 Vicryl subcuticular stitches.  A layer of Dermabond was  placed.  After Dermabond dried a sterile dressing was applied.  Patient was then removed from the [place] head holder and transferred to the stretcher.  He was then extubated and taken to the postanesthesia care unit in stable hemodynamic condition.  At the end of the case all sponge, needle, instrument, and cottonoid counts were correct.

## 2018-05-10 ENCOUNTER — Encounter (HOSPITAL_COMMUNITY): Payer: Self-pay | Admitting: Neurosurgery

## 2018-05-10 ENCOUNTER — Inpatient Hospital Stay (HOSPITAL_COMMUNITY): Payer: Medicare HMO

## 2018-05-10 LAB — POCT I-STAT 7, (LYTES, BLD GAS, ICA,H+H)
ACID-BASE DEFICIT: 2 mmol/L (ref 0.0–2.0)
Acid-base deficit: 5 mmol/L — ABNORMAL HIGH (ref 0.0–2.0)
Bicarbonate: 23 mmol/L (ref 20.0–28.0)
Bicarbonate: 19.7 mmol/L — ABNORMAL LOW (ref 20.0–28.0)
CALCIUM ION: 1.21 mmol/L (ref 1.15–1.40)
Calcium, Ion: 1.22 mmol/L (ref 1.15–1.40)
HCT: 37 % — ABNORMAL LOW (ref 39.0–52.0)
HEMATOCRIT: 35 % — AB (ref 39.0–52.0)
Hemoglobin: 11.9 g/dL — ABNORMAL LOW (ref 13.0–17.0)
Hemoglobin: 12.6 g/dL — ABNORMAL LOW (ref 13.0–17.0)
O2 Saturation: 100 %
O2 Saturation: 100 %
POTASSIUM: 4.6 mmol/L (ref 3.5–5.1)
Patient temperature: 35.3
Patient temperature: 35.6
Potassium: 4.7 mmol/L (ref 3.5–5.1)
Sodium: 136 mmol/L (ref 135–145)
Sodium: 141 mmol/L (ref 135–145)
TCO2: 24 mmol/L (ref 22–32)
TCO2: 21 mmol/L — AB (ref 22–32)
pCO2 arterial: 37 mmHg (ref 32.0–48.0)
pCO2 arterial: 34.4 mmHg (ref 32.0–48.0)
pH, Arterial: 7.394 (ref 7.350–7.450)
pH, Arterial: 7.36 (ref 7.350–7.450)
pO2, Arterial: 444 mmHg — ABNORMAL HIGH (ref 83.0–108.0)
pO2, Arterial: 276 mmHg — ABNORMAL HIGH (ref 83.0–108.0)

## 2018-05-10 LAB — BASIC METABOLIC PANEL
Anion gap: 13 (ref 5–15)
BUN: 15 mg/dL (ref 8–23)
CO2: 22 mmol/L (ref 22–32)
Calcium: 8.8 mg/dL — ABNORMAL LOW (ref 8.9–10.3)
Chloride: 102 mmol/L (ref 98–111)
Creatinine, Ser: 1 mg/dL (ref 0.61–1.24)
GFR calc Af Amer: >60 mL/min (ref >60)
GFR calc non Af Amer: >60 mL/min (ref >60)
Glucose, Bld: 153 mg/dL — ABNORMAL HIGH (ref 70–99)
Potassium: 4.2 mmol/L (ref 3.5–5.1)
Sodium: 137 mmol/L (ref 135–145)

## 2018-05-10 LAB — CBC
HCT: 36.4 % — ABNORMAL LOW (ref 39.0–52.0)
Hemoglobin: 13.2 g/dL (ref 13.0–17.0)
MCH: 31 pg (ref 26.0–34.0)
MCHC: 36.3 g/dL — ABNORMAL HIGH (ref 30.0–36.0)
MCV: 85.4 fL (ref 80.0–100.0)
Platelets: 175 10*3/uL (ref 150–400)
RBC: 4.26 MIL/uL (ref 4.22–5.81)
RDW: 12.1 % (ref 11.5–15.5)
WBC: 20 10*3/uL — ABNORMAL HIGH (ref 4.0–10.5)
nRBC: 0 % (ref 0.0–0.2)

## 2018-05-10 LAB — MRSA PCR SCREENING: MRSA by PCR: NEGATIVE

## 2018-05-10 MED ORDER — GADOBUTROL 1 MMOL/ML IV SOLN
7.0000 mL | Freq: Once | INTRAVENOUS | Status: AC | PRN
  Administered 2018-05-10: 7 mL via INTRAVENOUS

## 2018-05-10 MED ORDER — METHOCARBAMOL 1000 MG/10ML IJ SOLN
500.0000 mg | Freq: Three times a day (TID) | INTRAVENOUS | Status: DC
  Administered 2018-05-10 – 2018-05-16 (×18): 500 mg via INTRAVENOUS
  Filled 2018-05-10 (×7): qty 5
  Filled 2018-05-10: qty 500
  Filled 2018-05-10 (×2): qty 5
  Filled 2018-05-10: qty 500
  Filled 2018-05-10 (×4): qty 5
  Filled 2018-05-10: qty 500
  Filled 2018-05-10 (×4): qty 5

## 2018-05-10 MED ORDER — HYDRALAZINE HCL 20 MG/ML IJ SOLN
5.0000 mg | Freq: Four times a day (QID) | INTRAMUSCULAR | Status: DC | PRN
  Administered 2018-05-10 – 2018-05-11 (×2): 10 mg via INTRAVENOUS
  Administered 2018-05-12: 20 mg via INTRAVENOUS
  Filled 2018-05-10 (×3): qty 1

## 2018-05-10 NOTE — Evaluation (Signed)
Clinical/Bedside Swallow Evaluation Patient Details  Name: Logan Phillips MRN: [id number] Date of Birth: [birth year]  Today's Date: 05/10/2018 Time: SLP Start Time (ACUTE ONLY): 1539 SLP Stop Time (ACUTE ONLY): 1554 SLP Time Calculation (min) (ACUTE ONLY): 15 min  Past Medical History:  Past Medical History:  Diagnosis Date  . Chronic pain in shoulder 2018   [place]  . H/O echocardiogram 10/08/2016   EF 65-70%, normal wall motion, systolic function vigorous  . Hearing loss    pending hearing aids 06/2014  . Hypertension   . Lipoma   . Varicose vein   . Vestibular schwannoma [facility])    Past Surgical History:  Past Surgical History:  Procedure Laterality Date  . APPENDECTOMY    . APPLICATION OF CRANIAL NAVIGATION Right 05/09/2018   Procedure: APPLICATION OF CRANIAL NAVIGATION;  Surgeon: Consuella Lose, MD;  Location: [place];  Service: Neurosurgery;  Laterality: Right;  . BIOPSY  04/12/2018   Procedure: BIOPSY;  Surgeon: Juanita Craver, MD;  Location: [facility] ENDOSCOPY;  Service: Endoscopy;;  . COLONOSCOPY     age 45, he did not f/u with referral 2018  . COLONOSCOPY WITH PROPOFOL N/A 04/12/2018   Procedure: COLONOSCOPY WITH PROPOFOL;  Surgeon: Juanita Craver, MD;  Location: [facility] ENDOSCOPY;  Service: Endoscopy;  Laterality: N/A;  . CRANIOTOMY Right 05/09/2018   Procedure: Right retrosigmoid craniectomy, resection of acoustic neuroma with intraoperative facial monitoring/Brain Lab;  Surgeon: Consuella Lose, MD;  Location: [place];  Service: Neurosurgery;  Laterality: Right;  . LUMBAR DISC SURGERY  2002   Dr. Rita Ohara  . 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 Post-operatively he has had facial droop. MRI suggests a small area of cerebellar ischemia. PMH: HTN, vestibular schwannoma, heavy alcohol use (sober since ~04/09/18), hearing  loss   Assessment / Plan / Recommendation Clinical Impression  Pt has no overt signs of aspiration, but he does have moderate R-sided facial weakness (suspect from CN VII) that interferes with labial seal and oral preparation. Pt compensates well with Min cues, using the left side of his mouth for mastication and taking very small bites. As a result there is no buccal pocketing noted. Recommend advancement up to Dys 3 diet and thin liquids if okay with surgeon (currently has him on clear liquid diet). SLP to follow up for tolerance and advancement. Pt and daughter are also concerned about his speech - may wish to consider SLP speech/language evaluation as well. SLP Visit Diagnosis: Dysphagia, unspecified (R13.10)    Aspiration Risk  Mild aspiration risk    Diet Recommendation Dysphagia 3 (Mech soft);Thin liquid   Liquid Administration via: Cup;Straw Medication Administration: Whole meds with liquid(can put in puree PRN) Supervision: Patient able to self feed;Intermittent supervision to cue for compensatory strategies Compensations: Slow rate;Small sips/bites;Lingual sweep for clearance of pocketing Postural Changes: Seated upright at 90 degrees    Other  Recommendations Oral Care Recommendations: Oral care BID   Follow up Recommendations (tba)      Frequency and Duration min 2x/week  2 weeks       Prognosis Prognosis for Safe Diet Advancement: Good      Swallow Study   General HPI: Pt is a 68 yo male s/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 Post-operatively he  has had facial droop. MRI suggests a small area of cerebellar ischemia. PMH: HTN, vestibular schwannoma, heavy alcohol use (sober since ~04/09/18), hearing loss Type of Study: Bedside Swallow Evaluation Previous Swallow Assessment: none in chart Diet Prior to this Study: Thin liquids(clear liquid diet) Temperature Spikes Noted:  No Respiratory Status: Room air History of Recent Intubation: No Behavior/Cognition: Alert;Cooperative;Other (Comment)(hard of hearing) Oral Cavity Assessment: Within Functional Limits Oral Care Completed by SLP: No Oral Cavity - Dentition: Adequate natural dentition Vision: Functional for self-feeding Self-Feeding Abilities: Able to feed self Patient Positioning: Upright in bed Baseline Vocal Quality: Normal Volitional Cough: Strong Volitional Swallow: Able to elicit    Oral/Motor/Sensory Function Overall Oral Motor/Sensory Function: Moderate impairment Facial ROM: Reduced right;Suspected CN VII (facial) dysfunction Facial Symmetry: Abnormal symmetry right;Suspected CN VII (facial) dysfunction Facial Strength: Reduced right;Suspected CN VII (facial) dysfunction Facial Sensation: Other (Comment)(pt says he was numb yesterday but not today) Lingual ROM: Within Functional Limits Lingual Symmetry: Within Functional Limits Lingual Strength: Within Functional Limits Lingual Sensation: Within Functional Limits Velum: Within Functional Limits Mandible: Within Functional Limits   Ice Chips Ice chips: Within functional limits Presentation: Spoon   Thin Liquid Thin Liquid: Within functional limits Presentation: Cup;Self Fed;Spoon;Straw    Nectar Thick Nectar Thick Liquid: Not tested   Honey Thick Honey Thick Liquid: Not tested   Puree Puree: Within functional limits Presentation: Spoon;Self Fed   Solid     Solid: Impaired Presentation: Self Fed Oral Phase Impairments: Impaired mastication(pt says he needs to use the L side, takes very small bites)      [name] 05/10/2018,4:50 PM   Pollyann Glen, M.A. [place] Acute Environmental education officer [phone] Office [phone]

## 2018-05-10 NOTE — Progress Notes (Signed)
Spoke with MD Kathyrn Sheriff with neurosurgery to update on pt's status. Due to pt's right sided facial droop and previous gait instability, verbal order was put in on for PT/OT/Speech. Pt also complaining of right sided neck pain. Verbal order for robaxin placed-see MAR. Pt and family aware of update.

## 2018-05-10 NOTE — Progress Notes (Signed)
Chaplain responded to request from nurse to visit family. Family member desired "Imposition of Ashes" for [facility] Wednesday.  Chaplain met patient, his daughter Lovena Le, and another. Chaplain provided ministry of presence, ashes, and prayer.Will continue to bel available.  Tamsen Snider Pager [phone]

## 2018-05-10 NOTE — Anesthesia Postprocedure Evaluation (Signed)
Anesthesia Post Note  Patient: CHAYNCE SCHAFER  Procedure(s) Performed: Right retrosigmoid craniectomy, resection of acoustic neuroma with intraoperative facial monitoring/Brain Lab (Right Head) APPLICATION OF CRANIAL NAVIGATION (Right Head)     Patient location during evaluation: PACU Anesthesia Type: General Level of consciousness: awake and alert Pain management: pain level controlled Vital Signs Assessment: post-procedure vital signs reviewed and stable Respiratory status: spontaneous breathing, nonlabored ventilation, respiratory function stable and patient connected to nasal cannula oxygen Cardiovascular status: blood pressure returned to baseline and stable Postop Assessment: no apparent nausea or vomiting Anesthetic complications: no    Last Vitals:  Vitals:   05/10/18 1200 05/10/18 1300  BP: 139/86 (!) 145/86  Pulse: 63 64  Resp: 16 15  Temp: 36.7 C   SpO2: 98% 98%    Last Pain:  Vitals:   05/10/18 1200  TempSrc: Oral  PainSc: 7                  Tiajuana Amass

## 2018-05-11 ENCOUNTER — Other Ambulatory Visit: Payer: Self-pay | Admitting: Radiation Therapy

## 2018-05-11 MED ORDER — ARTIFICIAL TEARS OPHTHALMIC OINT
TOPICAL_OINTMENT | Freq: Three times a day (TID) | OPHTHALMIC | Status: DC
  Administered 2018-05-11 – 2018-05-13 (×5): via OPHTHALMIC
  Administered 2018-05-13: 1 via OPHTHALMIC
  Administered 2018-05-14 – 2018-05-17 (×9): via OPHTHALMIC
  Administered 2018-05-17: 1 via OPHTHALMIC
  Filled 2018-05-11 (×2): qty 3.5

## 2018-05-11 NOTE — Evaluation (Signed)
Physical Therapy Evaluation Patient Details Name: Logan Phillips MRN: [id number] DOB: Aug 01, 1950 Today's Date: 05/11/2018   History of Present Illness  Pt is a 68 yo male s/p brain tumor resection who presented with c/o dizziness, gait instability, hearing loss, and GI symptoms. PMH: vestibular schwannoma, heavy alcohol use, chronic shoulder pain, HTN.   Clinical Impression   Pt received in bed, willing to participate in therapy. Co-treat with OT. Bed mobility and sit to stand transfers with min guard. Pt able to ambulate approximately 120 ft with min guard, mildly unsteady. He reports some blurriness with vision; eyelid droop on the R eye. Overall he is moving well with mild unsteadiness and requiring some cuing for safety. Recommending HHPT with supervision for mobility to ensure pt is safe. Pt will continue to benefit from skilled acute PT services in order to maximize his independence and ensure safe DC home.     Follow Up Recommendations Home health PT;Supervision for mobility/OOB    Equipment Recommendations  Rolling walker with 5" wheels;3in1 (PT)    Recommendations for Other Services       Precautions / Restrictions Precautions Precautions: Fall Restrictions Weight Bearing Restrictions: No      Mobility  Bed Mobility Overal bed mobility: Needs Assistance Bed Mobility: Supine to Sit     Supine to sit: Min guard;HOB elevated     General bed mobility comments: pt able to move supine to sit EOB without physical assist, required multiple v/c to understand which side of bed to move towards  Transfers Overall transfer level: Needs assistance Equipment used: Rolling walker (2 wheeled) Transfers: Sit to/from Stand Sit to Stand: Min guard         General transfer comment: Pt able to stand from bed and sit to chair without physical assistance, min guard for safety, v/c for hand placement.  Ambulation/Gait Ambulation/Gait assistance: Min guard Gait Distance (Feet):  120 Feet Assistive device: Rolling walker (2 wheeled) Gait Pattern/deviations: Step-through pattern;Decreased step length - left;Decreased step length - right;Decreased dorsiflexion - left;Decreased dorsiflexion - right Gait velocity: decreased   General Gait Details: Pt able to ambulate in hall with min guard for safety. V/c to loosen grip on RW. Noted shortened, slow steps.  Stairs            Wheelchair Mobility    Modified Rankin (Stroke Patients Only)       Balance Overall balance assessment: Needs assistance Sitting-balance support: No upper extremity supported;Feet supported Sitting balance-Leahy Scale: Fair       Standing balance-Leahy Scale: Poor Standing balance comment: Requires RW for support during standing and ambulation                             Pertinent Vitals/Pain Pain Assessment: Faces Faces Pain Scale: Hurts a little bit Pain Location: neck Pain Descriptors / Indicators: Sore Pain Intervention(s): Monitored during session;Premedicated before session;Repositioned    Home Living Family/patient expects to be discharged to:: Private residence Living Arrangements: Children(daughter Lovena Le) Available Help at Discharge: Available PRN/intermittently Type of Home: House Home Access: Stairs to enter Entrance Stairs-Rails: None Entrance Stairs-Number of Steps: 2 Home Layout: Multi-level;Able to live on main level with bedroom/bathroom;Bed/bath upstairs Home Equipment: Kasandra Knudsen - single point Additional Comments: Pt says that he can DC home to one of his daughter's houses, that they all work but can possibly work from home if needed.    Prior Function Level of Independence: Independent  Comments: used SPC for community mobility, and independent in ADL/IADL and driving     Hand Dominance   Dominant Hand: Right    Extremity/Trunk Assessment   Upper Extremity Assessment Upper Extremity Assessment: Overall WFL for tasks assessed     Lower Extremity Assessment Lower Extremity Assessment: RLE deficits/detail;LLE deficits/detail RLE Deficits / Details: Grossly 4/5 RLE Sensation: WNL LLE Deficits / Details: Grossly 4/5 LLE Sensation: WNL    Cervical / Trunk Assessment Cervical / Trunk Assessment: Normal  Communication   Communication: HOH;Other (comment)(deaf in one ear)  Cognition Arousal/Alertness: Awake/alert Behavior During Therapy: WFL for tasks assessed/performed Overall Cognitive Status: Within Functional Limits for tasks assessed                                 General Comments: Pt able to follow commands, intermittently with increased time, respond to questions appropriately.      General Comments General comments (skin integrity, edema, etc.): Vision screening demonstrates saccadic smooth pursuits and slowed vertical saccades.    Exercises     Assessment/Plan    PT Assessment Patient needs continued PT services  PT Problem List Decreased balance;Decreased cognition;Pain;Decreased strength;Decreased mobility;Decreased knowledge of use of DME;Decreased activity tolerance;Decreased coordination;Decreased safety awareness       PT Treatment Interventions DME instruction;Functional mobility training;Balance training;Patient/family education;Gait training;Therapeutic activities;Neuromuscular re-education;Stair training;Therapeutic exercise;Other (comment)(vestibular rehab)    PT Goals (Current goals can be found in the Care Plan section)  Acute Rehab PT Goals Patient Stated Goal: get out of bed PT Goal Formulation: With patient Time For Goal Achievement: 05/25/18 Potential to Achieve Goals: Good    Frequency Min 3X/week   Barriers to discharge        Co-evaluation PT/OT/SLP Co-Evaluation/Treatment: Yes Reason for Co-Treatment: Complexity of the patient's impairments (multi-system involvement);For patient/therapist safety;To address functional/ADL transfers PT goals addressed  during session: Mobility/safety with mobility;Balance;Proper use of DME         AM-PAC PT "6 Clicks" Mobility  Outcome Measure Help needed turning from your back to your side while in a flat bed without using bedrails?: None Help needed moving from lying on your back to sitting on the side of a flat bed without using bedrails?: None Help needed moving to and from a bed to a chair (including a wheelchair)?: None Help needed standing up from a chair using your arms (e.g., wheelchair or bedside chair)?: None Help needed to walk in hospital room?: A Little Help needed climbing 3-5 steps with a railing? : A Little 6 Click Score: 22    End of Session Equipment Utilized During Treatment: Gait belt Activity Tolerance: Patient tolerated treatment well Patient left: in chair;with chair alarm set;with call bell/phone within reach   PT Visit Diagnosis: Unsteadiness on feet (R26.81);Muscle weakness (generalized) (M62.81);Other symptoms and signs involving the nervous system (R29.898)    Time: 1115-1150 PT Time Calculation (min) (ACUTE ONLY): 35 min   Charges:   PT Evaluation $PT Eval Moderate Complexity: 1 Mod          Ronnell Guadalajara, SPT   Ronnell Guadalajara 05/11/2018, 12:34 PM

## 2018-05-11 NOTE — Plan of Care (Signed)
  Problem: Elimination: Goal: Will not experience complications related to urinary retention Outcome: Adequate for Discharge   Problem: Pain Managment: Goal: General experience of comfort will improve Outcome: Progressing   Problem: Skin Integrity: Goal: Risk for impaired skin integrity will decrease Outcome: Progressing

## 2018-05-11 NOTE — Progress Notes (Signed)
  Speech Language Pathology Treatment: Dysphagia  Patient Details Name: Logan Phillips MRN: [id number] DOB: 03-26-1950 Today's Date: 05/11/2018 Time: 4196-2229 SLP Time Calculation (min) (ACUTE ONLY): 8 min  Assessment / Plan / Recommendation Clinical Impression  Pt alert, upright and participatory. Diet ordered continues to be full liquid; pt stated [name] present earlier and stated he did not need to remain on liquid diet which RN confirmed. [name] blueberry muffin at bedside brought from friend which he stated he had eaten a small piece. He recalled SLP advising him to [employer] on left side of mouth. Needed supervision to ensure mobilization of muffin from right to left side oral cavity. No cough, throat clear or wet vocal quality following muffin or consecutive straw sips with thin. Recommend Dys 3 texture, thin, straws allowed, pills with thin and reminded to check right side during meal and especially at end due to decreased sensation. Will follow briefly for safety and efficiency with diet and upgrade if appropriate.    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 Post-operatively he has had facial droop. MRI suggests a small area of cerebellar ischemia. PMH: HTN, vestibular schwannoma, heavy alcohol use (sober since ~04/09/18), hearing loss      SLP Plan  Continue with current plan of care       Recommendations  Diet recommendations: Dysphagia 3 (mechanical soft);Thin liquid Liquids provided via: Cup;Straw Medication Administration: Whole meds with liquid Supervision: Patient able to self feed Compensations: Slow rate;Small sips/bites;Lingual sweep for clearance of pocketing Postural Changes and/or Swallow Maneuvers: Seated upright 90 degrees                Oral Care Recommendations: Oral care BID Follow up Recommendations: None SLP Visit Diagnosis:  Dysphagia, unspecified (R13.10) Plan: Continue with current plan of care       GO                Houston Siren 05/11/2018, 11:10 AM  [name] Colvin Caroli.Ed Risk analyst [phone] Office [phone]

## 2018-05-11 NOTE — Evaluation (Signed)
Occupational Therapy Evaluation Patient Details Name: Logan Phillips MRN: [id number] DOB: December 22, 1950 Today's Date: 05/11/2018    History of Present Illness Pt is a 68 yo male s/p brain tumor resection who presented with c/o dizziness, gait instability, hearing loss, and GI symptoms. PMH: vestibular schwannoma, heavy alcohol use, chronic shoulder pain, HTN.   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 Pt is currently min guard +2 for safety and line management for transfers and short mobility. Dizzy throughout session and it was not affected by head turns, positional changes or gaze stabilization (reports it feels like he's on a boat)  Mod A for LB ADL and set up/min A for UB ADL. Pt with visual deficits (see below for details) and will require 24 hour assist at dc (has 3 daughters that he will work on arranging schedules) Pt will benefit from skilled OT in the acute setting as well as afterwards at the [facility] level to maximize safety and independence in ADL and functional transfers.  Next session to focus on standing balance at sink, re-assess lower body access for ADL and vision.    Follow Up Recommendations  Home health OT;Supervision/Assistance - 24 hour    Equipment Recommendations  3 in 1 bedside commode    Recommendations for Other Services       Precautions / Restrictions Precautions Precautions: Fall Restrictions Weight Bearing Restrictions: No      Mobility Bed Mobility Overal bed mobility: Needs Assistance Bed Mobility: Supine to Sit     Supine to sit: Min guard;HOB elevated     General bed mobility comments: pt able to move supine to sit EOB without physical assist, required multiple v/c to understand which side of bed to move towards  Transfers Overall transfer level: Needs assistance Equipment used: Rolling walker (2 wheeled) Transfers: Sit to/from Stand Sit to Stand: Min guard         General  transfer comment: Pt able to stand from bed and sit to chair without physical assistance, min guard for safety, v/c for hand placement.    Balance Overall balance assessment: Needs assistance Sitting-balance support: No upper extremity supported;Feet supported Sitting balance-Leahy Scale: Fair     Standing balance support: Bilateral upper extremity supported Standing balance-Leahy Scale: Poor Standing balance comment: Requires RW for support during standing and ambulation                           ADL either performed or assessed with clinical judgement   ADL Overall ADL's : Needs assistance/impaired Eating/Feeding: Set up;Sitting   Grooming: Set up;Sitting Grooming Details (indicate cue type and reason): dependent on BUE for support on RW Upper Body Bathing: Set up;Sitting   Lower Body Bathing: Min guard   Upper Body Dressing : Min guard   Lower Body Dressing: Moderate assistance;Sit to/from stand Lower Body Dressing Details (indicate cue type and reason): requires assist for balance and clothing managment with sit <>stand Toilet Transfer: Min guard;Ambulation;RW;+2 for safety/equipment   Toileting- Clothing Manipulation and Hygiene: Moderate assistance;Sit to/from stand       Functional mobility during ADLs: Min guard;+2 for safety/equipment;Rolling walker General ADL Comments: decreased access to LB for ADL     Vision Baseline Vision/History: No visual deficits Patient Visual Report: Blurring of vision Vision Assessment?: Yes Eye Alignment: Within Functional Limits Ocular Range of Motion: Within Functional Limits Alignment/Gaze Preference: Within Defined Limits Tracking/Visual Pursuits: Decreased  smoothness of horizontal tracking Saccades: Decreased speed of saccadic movement(especially with vertical) Additional Comments: Pt able to read signs with ambulation     Perception     Praxis      Pertinent Vitals/Pain Pain Assessment: Faces Faces Pain  Scale: Hurts a little bit Pain Location: neck Pain Descriptors / Indicators: Sore Pain Intervention(s): Monitored during session;Premedicated before session;Repositioned     Hand Dominance Right   Extremity/Trunk Assessment Upper Extremity Assessment Upper Extremity Assessment: Overall WFL for tasks assessed(complains about IV location)   Lower Extremity Assessment Lower Extremity Assessment: Defer to PT evaluation RLE Deficits / Details: Grossly 4/5 RLE Sensation: WNL LLE Deficits / Details: Grossly 4/5 LLE Sensation: WNL   Cervical / Trunk Assessment Cervical / Trunk Assessment: Normal   Communication Communication Communication: HOH;Other (comment)(deaf in one ear)   Cognition Arousal/Alertness: Awake/alert Behavior During Therapy: WFL for tasks assessed/performed Overall Cognitive Status: Within Functional Limits for tasks assessed                                 General Comments: Pt able to follow commands, intermittently with increased time, respond to questions appropriately.   General Comments  Vision screening demonstrates saccadic smooth pursuits and slowed vertical saccades.    Exercises     Shoulder Instructions      Home Living Family/patient expects to be discharged to:: Private residence Living Arrangements: Children(daughter Lovena Le) Available Help at Discharge: Available PRN/intermittently Type of Home: House Home Access: Stairs to enter Entrance Stairs-Number of Steps: 2 Entrance Stairs-Rails: None Home Layout: Multi-level;Able to live on main level with bedroom/bathroom;Bed/bath upstairs Alternate Level Stairs-Number of Steps: flight   Bathroom Shower/Tub: Occupational psychologist: Standard     Home Equipment: Cane - single point   Additional Comments: Pt says that he can DC home to one of his daughter's houses, that they all work but can possibly work from home if needed.      Prior Functioning/Environment Level of  Independence: Independent        Comments: used SPC for community mobility, and independent in ADL/IADL and driving        OT Problem List: Decreased activity tolerance;Impaired balance (sitting and/or standing);Decreased safety awareness;Decreased knowledge of use of DME or AE;Decreased knowledge of precautions;Pain      OT Treatment/Interventions: Self-care/ADL training;DME and/or AE instruction;Therapeutic activities;Patient/family education;Balance training;Visual/perceptual remediation/compensation    OT Goals(Current goals can be found in the care plan section) Acute Rehab OT Goals Patient Stated Goal: get out of bed OT Goal Formulation: With patient Time For Goal Achievement: 05/25/18 Potential to Achieve Goals: Good ADL Goals Pt Will Perform Grooming: with supervision;standing Pt Will Perform Upper Body Dressing: with modified independence;sitting Pt Will Perform Lower Body Dressing: with supervision;sit to/from stand Pt Will Transfer to Toilet: with modified independence;ambulating Pt Will Perform Toileting - Clothing Manipulation and hygiene: sit to/from stand;with supervision  OT Frequency: Min 3X/week   Barriers to D/C:            Co-evaluation PT/OT/SLP Co-Evaluation/Treatment: Yes Reason for Co-Treatment: Complexity of the patient's impairments (multi-system involvement);For patient/therapist safety;To address functional/ADL transfers PT goals addressed during session: Mobility/safety with mobility;Balance;Proper use of DME OT goals addressed during session: ADL's and self-care;Proper use of Adaptive equipment and DME      AM-PAC OT "6 Clicks" Daily Activity     Outcome Measure Help from another person eating meals?: None Help from another person taking  care of personal grooming?: A Little Help from another person toileting, which includes using toliet, bedpan, or urinal?: A Little Help from another person bathing (including washing, rinsing, drying)?: A  Little Help from another person to put on and taking off regular upper body clothing?: A Little Help from another person to put on and taking off regular lower body clothing?: A Lot 6 Click Score: 18   End of Session Equipment Utilized During Treatment: Gait belt;Rolling walker Nurse Communication: Mobility status  Activity Tolerance: Patient tolerated treatment well Patient left: in chair;with call bell/phone within reach;with chair alarm set  OT Visit Diagnosis: Unsteadiness on feet (R26.81);Other abnormalities of gait and mobility (R26.89);Muscle weakness (generalized) (M62.81);Low vision, both eyes (H54.2)                Time: 6063-0160 OT Time Calculation (min): 42 min Charges:  OT General Charges $OT Visit: 1 Visit OT Evaluation $OT Eval Moderate Complexity: [place] OTR/L Acute Rehabilitation Services Pager: [phone] Office: [place] 05/11/2018, 2:24 PM

## 2018-05-11 NOTE — Progress Notes (Signed)
  NEUROSURGERY PROGRESS NOTE   Pt seen and examined. No issues overnight. C/o some slurring speech which seems to be associated with administration of pain medicine. Able to swallow. No leakage of fluid. Cont to c/o neck pain but somewhat improved with addition of robaxin.  EXAM: Temp:  [97.9 F (36.6 C)-98.4 F (36.9 C)] 98.1 F (36.7 C) (02/27 1200) Pulse Rate:  [58-83] 63 (02/27 1200) Resp:  [8-22] 16 (02/27 1200) BP: (140-173)/(72-100) 154/77 (02/27 1106) SpO2:  [94 %-100 %] 99 % (02/27 1200) Intake/Output      02/26 0701 - 02/27 0700 02/27 0701 - 02/28 0700   I.V. (mL/kg) 365.5 (5.2) 64.4 (0.9)   Other     IV Piggyback 100 72.7   Total Intake(mL/kg) 465.5 (6.6) 137.1 (2)   Urine (mL/kg/hr) 600 (0.4) 350 (0.7)   Blood     Total Output 600 350   Net -134.5 -213        Urine Occurrence 1 x     Awake, alert, oriented Speech fluent, minimal dysarthria Unable to fully close right eye, minimal movement of mouth (H-B V facial palsy) Moves all extremities well  Wound c/d/i, no leak  LABS: Lab Results  Component Value Date   CREATININE 1.00 05/10/2018   BUN 15 05/10/2018   NA 137 05/10/2018   K 4.2 05/10/2018   CL 102 05/10/2018   CO2 22 05/10/2018   Lab Results  Component Value Date   WBC 20.0 (H) 05/10/2018   HGB 13.2 05/10/2018   HCT 36.4 (L) 05/10/2018   MCV 85.4 05/10/2018   PLT 175 05/10/2018    IMAGING: Postop MRI reviewed demonstrates near-total resection of tumor. Minimal residual within the proximal IAC. No HCP.  IMPRESSION: - 68 y.o. male POD#2 s/p right retrosigmoid craniectomy, resection of large AN, neurologically stable with significant facial dysfunction  PLAN: - Mobilize today with PT/OT - Cont SLP - Can start to wean steroids over weekend. - Likely transfer to stepdown tomorrow.

## 2018-05-11 NOTE — Progress Notes (Signed)
Pt stated he had hallucinations last night.  Suspected to be r/t Dilaudid 1mg  given prior to.  Gave 0.5 mg Dilaudid this morning and did not cause hallucinations.

## 2018-05-12 ENCOUNTER — Inpatient Hospital Stay (HOSPITAL_COMMUNITY): Payer: Medicare HMO

## 2018-05-12 MED ORDER — PANTOPRAZOLE SODIUM 40 MG IV SOLR
40.0000 mg | Freq: Two times a day (BID) | INTRAVENOUS | Status: DC
  Administered 2018-05-12 – 2018-05-14 (×5): 40 mg via INTRAVENOUS
  Filled 2018-05-12 (×5): qty 40

## 2018-05-12 MED ORDER — DEXAMETHASONE SODIUM PHOSPHATE 4 MG/ML IJ SOLN
8.0000 mg | Freq: Four times a day (QID) | INTRAMUSCULAR | Status: DC
  Administered 2018-05-12 – 2018-05-15 (×12): 8 mg via INTRAVENOUS
  Filled 2018-05-12 (×12): qty 2

## 2018-05-12 NOTE — Progress Notes (Signed)
Physical Therapy Treatment Patient Details Name: Logan Phillips MRN: [id number] DOB: 1951/03/13 Today's Date: 05/12/2018    History of Present Illness Pt is a 68 yo male s/p brain tumor resection who presented with c/o dizziness, gait instability, hearing loss, and GI symptoms. Head CT 2/28-extra-axial blood products and hygroma RIGHT posterior fossa. Mild residual RIGHT brachium pontis edema. PMH: vestibular schwannoma, heavy alcohol use, chronic shoulder pain, HTN.    PT Comments    Patient reports increase in headache and neck pain worsened post mobility. Tolerated gait training with close min guard for balance/safety secondary to dizziness. Pre activity BP 143/83, post activity BP 105/85. Reports dizziness improved slightly with sitting. Reports vision has improved from prior session as well. Will need to negotiate stairs prior to return home. Will follow.    Follow Up Recommendations  Home health PT;Supervision for mobility/OOB     Equipment Recommendations  Rolling walker with 5" wheels;3in1 (PT)    Recommendations for Other Services       Precautions / Restrictions Precautions Precautions: Fall Precaution Comments: watch BP Restrictions Weight Bearing Restrictions: No    Mobility  Bed Mobility               General bed mobility comments: up in chair upon PT arrival.   Transfers Overall transfer level: Needs assistance Equipment used: Rolling walker (2 wheeled) Transfers: Sit to/from Stand Sit to Stand: Min guard         General transfer comment: Min guard for safety. Stood from [employer]. Dizziness.   Ambulation/Gait Ambulation/Gait assistance: Min guard Gait Distance (Feet): 130 Feet Assistive device: Rolling walker (2 wheeled) Gait Pattern/deviations: Step-through pattern;Decreased step length - left;Decreased step length - right;Decreased dorsiflexion - left;Decreased dorsiflexion - right;Shuffle Gait velocity: decreased   General Gait Details: Slow,  shuffling like gait with RW for support; + dizziness worsened with mobility, reports as him moving and room moving. Drop in BP. 2 standing rest breaks.    Stairs             Wheelchair Mobility    Modified Rankin (Stroke Patients Only)       Balance Overall balance assessment: Needs assistance Sitting-balance support: No upper extremity supported;Feet supported Sitting balance-Leahy Scale: Fair     Standing balance support: Bilateral upper extremity supported Standing balance-Leahy Scale: Poor Standing balance comment: Requires RW for support during standing and ambulation                            Cognition Arousal/Alertness: Awake/alert Behavior During Therapy: WFL for tasks assessed/performed Overall Cognitive Status: Within Functional Limits for tasks assessed                                 General Comments: for basic mobility tasks. Difficult historian when describing dizzy symptoms.      Exercises      General Comments General comments (skin integrity, edema, etc.): Pre activity BP in sitting 143/83, post activity in sitting 105/85      Pertinent Vitals/Pain Pain Assessment: Faces Faces Pain Scale: Hurts even more Pain Location: neck and head Pain Descriptors / Indicators: Sore;Headache Pain Intervention(s): Monitored during session;Repositioned;Premedicated before session    Home Living                      Prior Function  PT Goals (current goals can now be found in the care plan section) Progress towards PT goals: Progressing toward goals    Frequency    Min 3X/week      PT Plan Current plan remains appropriate    Co-evaluation              AM-PAC PT "6 Clicks" Mobility   Outcome Measure  Help needed turning from your back to your side while in a flat bed without using bedrails?: None Help needed moving from lying on your back to sitting on the side of a flat bed without using  bedrails?: None Help needed moving to and from a bed to a chair (including a wheelchair)?: None Help needed standing up from a chair using your arms (e.g., wheelchair or bedside chair)?: A Little Help needed to walk in hospital room?: A Little Help needed climbing 3-5 steps with a railing? : A Little 6 Click Score: 21    End of Session Equipment Utilized During Treatment: Gait belt Activity Tolerance: Other (comment)(dizziness and drop in BP) Patient left: in chair;with chair alarm set;with call bell/phone within reach Nurse Communication: Mobility status;Other (comment)(drop in BP) PT Visit Diagnosis: Unsteadiness on feet (R26.81);Muscle weakness (generalized) (M62.81);Other symptoms and signs involving the nervous system (R29.898);Pain Pain - Right/Left: Left Pain - part of body: (neck; head)     Time: 0630-1601 PT Time Calculation (min) (ACUTE ONLY): 22 min  Charges:  $Gait Training: 8-22 mins                     Wray Kearns, PT, DPT Acute Rehabilitation Services Pager [phone] Office [phone]       [employer] 05/12/2018, 11:56 AM

## 2018-05-12 NOTE — Progress Notes (Signed)
  Speech Language Pathology Treatment: Dysphagia  Patient Details Name: Logan Phillips MRN: [id number] DOB: [birth year] Today's Date: 05/12/2018 Time: 6160-7371 SLP Time Calculation (min) (ACUTE ONLY): 9 min  Assessment / Plan / Recommendation Clinical Impression  Pt seen for dysphagia therapy with daughter present; reviewed (with pt's permission) status and recommendations.  He independently recalled and utilized strategy for potential right sided pocketing. He reports not having an appetite. SLP upgraded texture to regular and provided menu. He states he tries masticating on right intermittently to see if it has improved. Daughter asked about oral exercises; explained impairments are secondary to damage to nerve, discussed regeneration and performing exercises would not provide any additional benefit other than general discourse would. Education complete, diet upgraded and ST will sign off. Please reconsult if can assist further.     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 Post-operatively he has had facial droop. MRI suggests a small area of cerebellar ischemia. PMH: HTN, vestibular schwannoma, heavy alcohol use (sober since ~04/09/18), hearing loss      SLP Plan  All goals met;Discharge SLP treatment due to (comment)       Recommendations  Diet recommendations: Regular;Thin liquid Liquids provided via: Cup;Straw Medication Administration: Whole meds with liquid Supervision: Patient able to self feed Compensations: Slow rate;Small sips/bites;Lingual sweep for clearance of pocketing Postural Changes and/or Swallow Maneuvers: Seated upright 90 degrees                Oral Care Recommendations: Oral care BID Follow up Recommendations: None SLP Visit Diagnosis: Dysphagia, unspecified (R13.10) Plan: All goals met;Discharge SLP treatment due to (comment)        GO                Houston Siren 05/12/2018, 12:25 PM  [name] Colvin Caroli.Ed Risk analyst [phone] Office [phone]

## 2018-05-12 NOTE — Progress Notes (Signed)
Paged MD at 355 am regarding patient headache not resolved after .5mg  of dilaudid times 2. Stat Ct head ordered and then completed. Called MD with result at 0500, and ordered to give 1 mg of dilaudid then.

## 2018-05-12 NOTE — Progress Notes (Signed)
PT Cancellation Note  Patient Details Name: Logan Phillips MRN: [id number] DOB: Oct 23, 1950   Cancelled Treatment:    Reason Eval/Treat Not Completed: Patient declined, no reason specified Pt declines PT at this time secondary to headache and dizziness. Requests PT to follow up later. Will follow as time allows.   Marguarite Arbour A Linn Goetze 05/12/2018, 9:16 AM Wray Kearns, PT, DPT Acute Rehabilitation Services Pager [phone] Office [phone]

## 2018-05-12 NOTE — Progress Notes (Signed)
Subjective: Patient reports patient with condition of headache denies any nausea  Objective: Vital signs in last 24 hours: Temp:  [97.7 F (36.5 C)-98.1 F (36.7 C)] 97.7 F (36.5 C) (02/28 0400) Pulse Rate:  [41-75] 52 (02/28 0700) Resp:  [8-21] 14 (02/28 0700) BP: (121-176)/(72-110) 136/92 (02/28 0700) SpO2:  [92 %-100 %] 98 % (02/28 0700)  Intake/Output from previous day: 02/27 0701 - 02/28 0700 In: 383.8 [I.V.:211.2; IV Piggyback:172.6] Out: 1200 [Urine:1200] Intake/Output this shift: No intake/output data recorded.  awake alert oriented right central seventh unchanged strength 5 out of 5 upper and lower extremities incision clean dry and intact  Lab Results: Recent Labs    05/09/18 1346 05/10/18 0753  WBC  --  20.0*  HGB 12.6* 13.2  HCT 37.0* 36.4*  PLT  --  175   BMET Recent Labs    05/09/18 1346 05/10/18 0753  NA 141 137  K 4.7 4.2  CL  --  102  CO2  --  22  GLUCOSE  --  153*  BUN  --  15  CREATININE  --  1.00  CALCIUM  --  8.8*    Studies/Results: Ct Head Wo Contrast  Result Date: 05/12/2018 CLINICAL DATA:  Dizziness, gait instability, hearing loss. History of vestibular schwannoma, status post craniotomy May 09, 2018. EXAM: CT HEAD WITHOUT CONTRAST TECHNIQUE: Contiguous axial images were obtained from the base of the skull through the vertex without intravenous contrast. COMPARISON:  MRI of the head May 10, 2018 and CT HEAD May 08, 2018. FINDINGS: BRAIN: Blood products within RIGHT cerebellar pontine angle with small volume low-density extra-axial free fluid RIGHT posterior fossa and Surgicel. Mild edema RIGHT cerebral peduncle, patent fourth ventricle. Moderate to severe ventriculomegaly without hydrocephalus. Extra-axial pneumocephalus. No intraparenchymal hemorrhage, midline shift or acute large vascular territory infarcts. Basal cisterns are patent. VASCULAR: Unremarkable. SKULL: Status post RIGHT retrosigmoid craniectomy. RIGHT  suboccipital subcutaneous gas. SINUSES/ORBITS: Mild paranasal sinus mucosal thickening. Mastoid air cells are well aerated.The included ocular globes and orbital contents are non-suspicious. OTHER: None. IMPRESSION: 1. Status post RIGHT retrosigmoid craniectomy with small amount of extra-axial blood products and hygroma RIGHT posterior fossa. Mild residual RIGHT brachium pontis edema. 2. Stable advanced parenchymal brain volume loss. Electronically Signed   By: Elon Alas M.D.   On: 05/12/2018 04:36   Mr Jeri Cos GB Contrast  Result Date: 05/10/2018 CLINICAL DATA:  Gait instability and dizziness. Status post resection of right vestibular schwannoma. EXAM: MRI HEAD WITHOUT AND WITH CONTRAST TECHNIQUE: Multiplanar, multiecho pulse sequences of the brain and surrounding structures were obtained without and with intravenous contrast. CONTRAST:  7 mL Gadavist COMPARISON:  Head CT 05/08/2018 Brain MRI 04/12/2018 FINDINGS: BRAIN: There is mild peripheral diffusion restriction along the right lateral border of the cerebellum. Small amount of pneumocephalus at the right frontal pole. The midline structures are normal. No midline shift or other mass effect. Multifocal white matter hyperintensity, most commonly due to chronic ischemic microangiopathy. Advanced atrophy for age. Small amount of blood products at the right posterior fossa resection site. Punctate focus of magnetic susceptibility effect in the occipital horn of the left lateral ventricle. Foci of magnetic susceptibility effect along the superior right convexity and at the anterior right insula, possibly secondary to pneumocephalus. Status post right retromastoid craniectomy for resection of right cerebellopontine angle tumor. The majority of the tumor, including the entire extra canalicular portion, has been resected. There is mild linear residual contrast enhancement within the internal auditory canal. There is no  canal widening. VASCULAR: Major  intracranial arterial and venous sinus flow voids are normal. SKULL AND UPPER CERVICAL SPINE: Retromastoid right-sided occipital craniectomy with cranioplasty mesh. SINUSES/ORBITS: No fluid levels or advanced mucosal thickening. No mastoid or middle ear effusion. The orbits are normal. IMPRESSION: 1. Resection of the majority of right vestibular schwannoma with no residual extra canalicular component. Mild linear contrast enhancement within the internal auditory canal. 2. Small amount of diffusion restriction along the periphery of the lateral right cerebellum likely reflecting a small amount of ischemia. No intraparenchymal hemorrhage or mass effect. 3. Expected postoperative pneumocephalus without mass effect. Electronically Signed   By: Ulyses Jarred M.D.   On: 05/10/2018 13:26    Assessment/Plan: Postop day 2 suboccipital craniectomy for resection of acoustic schwannoma patient doing very well did have a postoperative seventh nerve palsy had a severe headache early this morning follow-up CT scan showed some blood in the resection bed but no significant brainstem compression or hydrocephalus. I will bump up the steroids will continue to observe in the ICU  LOS: 3 days     Brylan Dec P 05/12/2018, 8:10 AM

## 2018-05-13 NOTE — Progress Notes (Signed)
Subjective: Patient reports patient doing a little better with steroids is still severe headache  Objective: Vital signs in last 24 hours: Temp:  [97.6 F (36.4 C)-98.2 F (36.8 C)] 98.2 F (36.8 C) (02/29 0400) Pulse Rate:  [49-77] 57 (02/29 0600) Resp:  [8-23] 11 (02/29 0600) BP: (120-155)/(69-110) 137/94 (02/29 0600) SpO2:  [95 %-99 %] 97 % (02/29 0600)  Intake/Output from previous day: 02/28 0701 - 02/29 0700 In: 284.6 [I.V.:134.6; IV Piggyback:150] Out: 550 [Urine:550] Intake/Output this shift: No intake/output data recorded.  awake alert oriented right seventh nerve palsy  Lab Results: Recent Labs    05/10/18 0753  WBC 20.0*  HGB 13.2  HCT 36.4*  PLT 175   BMET Recent Labs    05/10/18 0753  NA 137  K 4.2  CL 102  CO2 22  GLUCOSE 153*  BUN 15  CREATININE 1.00  CALCIUM 8.8*    Studies/Results: Ct Head Wo Contrast  Result Date: 05/12/2018 CLINICAL DATA:  Dizziness, gait instability, hearing loss. History of vestibular schwannoma, status post craniotomy May 09, 2018. EXAM: CT HEAD WITHOUT CONTRAST TECHNIQUE: Contiguous axial images were obtained from the base of the skull through the vertex without intravenous contrast. COMPARISON:  MRI of the head May 10, 2018 and CT HEAD May 08, 2018. FINDINGS: BRAIN: Blood products within RIGHT cerebellar pontine angle with small volume low-density extra-axial free fluid RIGHT posterior fossa and Surgicel. Mild edema RIGHT cerebral peduncle, patent fourth ventricle. Moderate to severe ventriculomegaly without hydrocephalus. Extra-axial pneumocephalus. No intraparenchymal hemorrhage, midline shift or acute large vascular territory infarcts. Basal cisterns are patent. VASCULAR: Unremarkable. SKULL: Status post RIGHT retrosigmoid craniectomy. RIGHT suboccipital subcutaneous gas. SINUSES/ORBITS: Mild paranasal sinus mucosal thickening. Mastoid air cells are well aerated.The included ocular globes and orbital contents  are non-suspicious. OTHER: None. IMPRESSION: 1. Status post RIGHT retrosigmoid craniectomy with small amount of extra-axial blood products and hygroma RIGHT posterior fossa. Mild residual RIGHT brachium pontis edema. 2. Stable advanced parenchymal brain volume loss. Electronically Signed   By: Elon Alas M.D.   On: 05/12/2018 04:36    Assessment/Plan: Transfer the floor continue the steroids  LOS: 4 days     Logan Phillips P 05/13/2018, 7:21 AM

## 2018-05-14 ENCOUNTER — Other Ambulatory Visit: Payer: Self-pay

## 2018-05-14 MED ORDER — PANTOPRAZOLE SODIUM 40 MG PO TBEC
40.0000 mg | DELAYED_RELEASE_TABLET | Freq: Two times a day (BID) | ORAL | Status: DC
  Administered 2018-05-14 – 2018-05-17 (×6): 40 mg via ORAL
  Filled 2018-05-14 (×6): qty 1

## 2018-05-14 NOTE — Progress Notes (Signed)
Subjective: Patient reports the patient was slightly better less headache no nausea  Objective: Vital signs in last 24 hours: Temp:  [97.5 F (36.4 C)-97.9 F (36.6 C)] 97.6 F (36.4 C) (03/01 0400) Pulse Rate:  [47-70] 61 (03/01 0700) Resp:  [10-21] 13 (03/01 0700) BP: (113-157)/(63-111) 124/93 (03/01 0700) SpO2:  [95 %-100 %] 96 % (03/01 0700)  Intake/Output from previous day: 02/29 0701 - 03/01 0700 In: 1188.8 [Phillips.O.:840; I.V.:198.8; IV Piggyback:150] Out: 125 [Urine:125] Intake/Output this shift: No intake/output data recorded.  strength out of 5 wound clean dry and intact right seventh nerve palsy  Lab Results: No results for input(s): WBC, HGB, HCT, PLT in the last 72 hours. BMET No results for input(s): NA, K, CL, CO2, GLUCOSE, BUN, CREATININE, CALCIUM in the last 72 hours.  Studies/Results: No results found.  Assessment/Plan: Postop day 4 from suboccipital craniectomy for acoustic neuroma patient overall doing fairly well steroids seem to be helping still awaiting on a bed transfer of unit will wean his IV steroids and mobilize.  LOS: 5 days     Logan Phillips 05/14/2018, 7:58 AM

## 2018-05-15 MED ORDER — DEXAMETHASONE SODIUM PHOSPHATE 4 MG/ML IJ SOLN
4.0000 mg | Freq: Four times a day (QID) | INTRAMUSCULAR | Status: DC
  Administered 2018-05-15 – 2018-05-16 (×4): 4 mg via INTRAVENOUS
  Filled 2018-05-15 (×4): qty 1

## 2018-05-15 NOTE — Care Management Note (Addendum)
Case Management Note  Patient Details  Name: Logan Phillips MRN: [id number] Date of Birth: 1950-06-26  Subjective/Objective:  Pt is a 68 yo male s/p brain tumor resection who presented with c/o dizziness, gait instability, hearing loss, and GI symptoms.  PTA, pt independent, lives at home with daughter.                  Action/Plan: PT/OT Recommending HH follow up at discharge.  MD: please leave orders for HHPT/OT follow up, if you agree with assessment.  He will also need RW and 3 in 1 BSC.   Family able to provide assistance at discharge.   Expected Discharge Date:                  Expected Discharge Plan:  [place]  In-House Referral:     Discharge planning Services  CM Consult  Post Acute Care Choice:  Home Health Choice offered to:     DME Arranged:    DME Agency:     HH Arranged:    [place] Agency:     Status of Service:  In process, will continue to follow  If discussed at Long Length of Stay Meetings, dates discussed:    Additional Comments:  Reinaldo Raddle, RN, BSN  Trauma/Neuro ICU Case Manager [phone]

## 2018-05-15 NOTE — Progress Notes (Signed)
Physical Therapy Treatment Patient Details Name: Logan Phillips MRN: [id number] DOB: 28-Mar-1950 Today's Date: 05/15/2018    History of Present Illness Pt is a 68 yo male s/p brain tumor resection who presented with c/o dizziness, gait instability, hearing loss, and GI symptoms. Head CT 2/28-extra-axial blood products and hygroma RIGHT posterior fossa. Mild residual RIGHT brachium pontis edema. PMH: vestibular schwannoma, heavy alcohol use, chronic shoulder pain, HTN.    PT Comments    Pt remains to have c/o dizziness with mobility and no appetite. Pt able to amb 150' with RW with min guard with 1 standing rest break. Pt reports onset of increased headache pain and worsening dizziness however no episode of overt LOB. Pt with mild hypotension but sense his dizziness is from surgery as it was in the occipital region. Acute PT to cont to follow.    Follow Up Recommendations  Home health PT;Supervision/Assistance - 24 hour     Equipment Recommendations  Rolling walker with 5" wheels;3in1 (PT)    Recommendations for Other Services       Precautions / Restrictions Precautions Precautions: Fall Precaution Comments: watch BP, dizzines with mobility Restrictions Weight Bearing Restrictions: No    Mobility  Bed Mobility Overal bed mobility: Needs Assistance Bed Mobility: Rolling;Sidelying to Sit Rolling: Supervision Sidelying to sit: Min guard       General bed mobility comments: pt with no nystagmus but c/o of "dizziness" upon coming to sit EOB. Pt denies room spinning "just dizzy"  Transfers Overall transfer level: Needs assistance Equipment used: Rolling walker (2 wheeled) Transfers: Sit to/from Stand Sit to Stand: Min assist         General transfer comment: minA to steady during transition of pushing up from bed to  hands on RW  Ambulation/Gait Ambulation/Gait assistance: Min guard Gait Distance (Feet): 150 Feet Assistive device: Rolling walker (2 wheeled) Gait  Pattern/deviations: Step-through pattern;Decreased stride length;Trunk flexed;Narrow base of support Gait velocity: decreased Gait velocity interpretation: <1.8 ft/sec, indicate of risk for recurrent falls General Gait Details: pt with c/o dizziness, pt with no overt episodes of LOB but very guarded, slow, and cautious with short shuffled steps, pt with wide turning radius requiring minA for walker management   Stairs             Wheelchair Mobility    Modified Rankin (Stroke Patients Only)       Balance Overall balance assessment: Needs assistance Sitting-balance support: No upper extremity supported Sitting balance-Leahy Scale: Fair Sitting balance - Comments: pt able to maintain sitting balance without waivering   Standing balance support: Bilateral upper extremity supported Standing balance-Leahy Scale: Poor Standing balance comment: Requires RW for support during standing and ambulation                            Cognition Arousal/Alertness: Awake/alert Behavior During Therapy: WFL for tasks assessed/performed Overall Cognitive Status: Within Functional Limits for tasks assessed                                 General Comments: pt extremely hard of hearing, hears out of L better than R      Exercises      General Comments General comments (skin integrity, edema, etc.): BPs: supine 122/76, sit 106/76, s/p 2 min sitting 110/81, walking 115/102, s/p sitting s/p walking 108/71      Pertinent Vitals/Pain Pain Assessment: 0-10  Pain Score: 5  Pain Location: headache Pain Descriptors / Indicators: Headache Pain Intervention(s): Monitored during session    Home Living                      Prior Function            PT Goals (current goals can now be found in the care plan section) Acute Rehab PT Goals Patient Stated Goal: get out of bed Progress towards PT goals: Progressing toward goals    Frequency    Min  3X/week      PT Plan Current plan remains appropriate    Co-evaluation              AM-PAC PT "6 Clicks" Mobility   Outcome Measure  Help needed turning from your back to your side while in a flat bed without using bedrails?: None Help needed moving from lying on your back to sitting on the side of a flat bed without using bedrails?: None Help needed moving to and from a bed to a chair (including a wheelchair)?: None Help needed standing up from a chair using your arms (e.g., wheelchair or bedside chair)?: A Little Help needed to walk in hospital room?: A Little Help needed climbing 3-5 steps with a railing? : A Little 6 Click Score: 21    End of Session Equipment Utilized During Treatment: Gait belt Activity Tolerance: Other (comment)(limited by dizziness) Patient left: in chair;with call bell/phone within reach;with chair alarm set Nurse Communication: Mobility status PT Visit Diagnosis: Unsteadiness on feet (R26.81);Muscle weakness (generalized) (M62.81);Other symptoms and signs involving the nervous system (R29.898);Pain Pain - part of body: (headache)     Time: 5009-3818 PT Time Calculation (min) (ACUTE ONLY): 27 min  Charges:  $Gait Training: 8-22 mins $Therapeutic Activity: 8-22 mins                     Kittie Plater, PT, DPT Acute Rehabilitation Services Pager #: [phone] Office #: [phone]    Berline Lopes 05/15/2018, 10:37 AM

## 2018-05-15 NOTE — Progress Notes (Signed)
Subjective: Patient reports doing well except condition headache  Objective: Vital signs in last 24 hours: Temp:  [97.6 F (36.4 C)-98 F (36.7 C)] 97.8 F (36.6 C) (03/02 0316) Pulse Rate:  [52-84] 82 (03/02 0316) Resp:  [11-20] 20 (03/01 1620) BP: (114-145)/(64-97) 142/97 (03/02 0316) SpO2:  [95 %-98 %] 97 % (03/01 2300)  Intake/Output from previous day: 03/01 0701 - 03/02 0700 In: 420 [P.O.:240; I.V.:30; IV Piggyback:150] Out: 300 [Urine:300] Intake/Output this shift: No intake/output data recorded.  right seventh nerve palsy otherwise neurologically intact  Lab Results: No results for input(s): WBC, HGB, HCT, PLT in the last 72 hours. BMET No results for input(s): NA, K, CL, CO2, GLUCOSE, BUN, CREATININE, CALCIUM in the last 72 hours.  Studies/Results: No results found.  Assessment/Plan: We'll bump up the steroids again today work on headache management  LOS: 6 days     Logan Phillips P 05/15/2018, 7:35 AM

## 2018-05-15 NOTE — Care Management Important Message (Signed)
Important Message  Patient Details  Name: Logan Phillips MRN: [id number] Date of Birth: 1950/08/13   Medicare Important Message Given:  Yes    [name] 05/15/2018, 4:28 PM

## 2018-05-16 ENCOUNTER — Encounter (HOSPITAL_COMMUNITY): Payer: Self-pay | Admitting: *Deleted

## 2018-05-16 MED ORDER — OXYCODONE HCL 5 MG PO TABS
5.0000 mg | ORAL_TABLET | ORAL | Status: DC | PRN
  Administered 2018-05-16: 10 mg via ORAL
  Administered 2018-05-16: 5 mg via ORAL
  Administered 2018-05-17: 10 mg via ORAL
  Filled 2018-05-16 (×2): qty 2
  Filled 2018-05-16 (×2): qty 1

## 2018-05-16 MED ORDER — METHYLPREDNISOLONE 4 MG PO TBPK
4.0000 mg | ORAL_TABLET | ORAL | Status: AC
  Administered 2018-05-16: 4 mg via ORAL

## 2018-05-16 MED ORDER — METHYLPREDNISOLONE 4 MG PO TBPK
4.0000 mg | ORAL_TABLET | Freq: Three times a day (TID) | ORAL | Status: DC
  Administered 2018-05-17 (×2): 4 mg via ORAL

## 2018-05-16 MED ORDER — METHYLPREDNISOLONE 4 MG PO TBPK
8.0000 mg | ORAL_TABLET | Freq: Every evening | ORAL | Status: AC
  Administered 2018-05-16: 8 mg via ORAL

## 2018-05-16 MED ORDER — BUTALBITAL-APAP-CAFFEINE 50-325-40 MG PO TABS
1.0000 | ORAL_TABLET | Freq: Four times a day (QID) | ORAL | Status: DC | PRN
  Administered 2018-05-16 – 2018-05-17 (×4): 2 via ORAL
  Filled 2018-05-16 (×4): qty 2

## 2018-05-16 MED ORDER — METHYLPREDNISOLONE 4 MG PO TBPK: 8.0000 mg | ORAL_TABLET | Freq: Every evening | ORAL | Status: DC

## 2018-05-16 MED ORDER — METHYLPREDNISOLONE 4 MG PO TBPK: 4.0000 mg | ORAL_TABLET | Freq: Four times a day (QID) | ORAL | Status: DC

## 2018-05-16 MED ORDER — METHYLPREDNISOLONE 4 MG PO TBPK
8.0000 mg | ORAL_TABLET | Freq: Every morning | ORAL | Status: AC
  Administered 2018-05-16: 8 mg via ORAL
  Filled 2018-05-16 (×2): qty 21

## 2018-05-16 MED ORDER — METHOCARBAMOL 750 MG PO TABS
750.0000 mg | ORAL_TABLET | Freq: Four times a day (QID) | ORAL | Status: DC
  Administered 2018-05-16 – 2018-05-17 (×5): 750 mg via ORAL
  Filled 2018-05-16 (×5): qty 1

## 2018-05-16 NOTE — Care Management Note (Signed)
Case Management Note  Patient Details  Name: Logan Phillips MRN: [id number] Date of Birth: [birth year]  Subjective/Objective:  Pt is a 68 yo male s/p brain tumor resection who presented with c/o dizziness, gait instability, hearing loss, and GI symptoms.  PTA, pt independent, lives at home with daughter.                  Action/Plan: PT/OT Recommending HH follow up at discharge.  MD: please leave orders for HHPT/OT follow up, if you agree with assessment.  He will also need RW and 3 in 1 BSC.   Family able to provide assistance at discharge.   Expected Discharge Date:                  Expected Discharge Plan:  [place]  In-House Referral:     Discharge planning Services  CM Consult  Post Acute Care Choice:  Home Health Choice offered to:  Patient  DME Arranged:  3-N-1, Walker rolling DME Agency:  [facility]  HH Arranged:  PT, OT, Speech Therapy HH Agency:  [place] [facility])  Status of Service:  In process, will continue to follow  If discussed at Long Length of Stay Meetings, dates discussed:    Additional Comments:  05/16/2018 J. Jaquawn Saffran, RN, BSN  PT/OT recommending [place] follow up, and pt agreeable to services.  Referral to [place], per pt choice; start of care 24-48h post dc date.  Referral to [place] for DME needs; RW and 3 in 1 to be delivered to bedside prior to dc.  Pt motivated and wants to get back to playing golf.  Pt states he will have assistance from his daughter, [name], and roommate [name].  Spoke with pt's daughter, [name] by phone to confirm support at discharge.   She states she and her two sisters are able to provide 24hr care for first two or so weeks, and feels that pt's roommate can assist with this as well long-term, if needed.  He does not work, and is available to help.  She is open to considering paid HHA/sitters if needed, and RN CM will provide resources for available private duty services.  We discussed home  care and DME to be arranged, and she is also in agreement with dc plans.  Daughter states that between family and roommate, pt will have assistance, if required.  They plan to pull together to make this work.   Reinaldo Raddle, RN, BSN  Trauma/Neuro ICU Case Manager [phone]

## 2018-05-16 NOTE — Progress Notes (Signed)
  NEUROSURGERY PROGRESS NOTE   No issues overnight.  Continues to have severe headache that is not well controlled with current regimen. Persistent right facial droop. No new deficits. No other concerns this am  EXAM:  BP (!) 137/94 (BP Location: Right Arm)   Pulse 65   Temp 98.5 F (36.9 C) (Oral)   Resp 17   Ht 5\' 7"  (1.702 m)   Wt 70.2 kg   SpO2 99%   BMI 24.24 kg/m   Awake, alert, oriented  Speech fluent, appropriate  Right facial droop 5/5 BUE/BLE Incision: c/d/i  PLAN Stable neurologically  HA: not well controlled. Steroids do not seem to make much of a difference.  - Wean off decadron  - Added Oxycodone and Fioricet for hopefully better pain control Plan on d/c tomorrow Orders placed for [facility]

## 2018-05-16 NOTE — Progress Notes (Signed)
Physical Therapy Treatment Patient Details Name: Logan Phillips MRN: [id number] DOB: Mar 29, 1950 Today's Date: 05/16/2018    History of Present Illness Pt is a 68 yo male s/p brain tumor resection who presented with c/o dizziness, gait instability, hearing loss, and GI symptoms. Head CT 2/28-extra-axial blood products and hygroma RIGHT posterior fossa. Mild residual RIGHT brachium pontis edema. PMH: vestibular schwannoma, heavy alcohol use, chronic shoulder pain, HTN.    PT Comments    Pt con't to report of dizziness/lightheadedness that doesn't resolve or change with movement. Pt amb better with RW for L HHA as patient feels more stable with RW and can increased step length and height. Pt must have 24/7 assist when returning home as pt is at significantly high falls risk. Pt states his daughter will be at home with him. Acute PT to cont to follow.   Follow Up Recommendations  Home health PT;Supervision/Assistance - 24 hour     Equipment Recommendations  Rolling walker with 5" wheels;3in1 (PT)    Recommendations for Other Services       Precautions / Restrictions Precautions Precautions: Fall Precaution Comments: dizziness with all movement Restrictions Weight Bearing Restrictions: No    Mobility  Bed Mobility Overal bed mobility: Needs Assistance Bed Mobility: Supine to Sit     Supine to sit: Min guard     General bed mobility comments: no physical assist needed, HOB elevated, c/o worsening dizziness/room spining that did not diminish s/p 4 min of sitting  Transfers Overall transfer level: Needs assistance Equipment used: 1 person hand held assist Transfers: Sit to/from Stand Sit to Stand: Min assist         General transfer comment: minA due to dizzines  Ambulation/Gait Ambulation/Gait assistance: Min assist Gait Distance (Feet): 200 Feet Assistive device: 1 person hand held assist Gait Pattern/deviations: Decreased stride length;Step-through pattern;Narrow base  of support Gait velocity: decreased Gait velocity interpretation: <1.8 ft/sec, indicate of risk for recurrent falls General Gait Details: pt with c/o lightheadedness reaching for railiing x1 to prevent fall. Pt with shorter shuffled steps with L HHA vs RW.    Stairs             Wheelchair Mobility    Modified Rankin (Stroke Patients Only)       Balance Overall balance assessment: Needs assistance Sitting-balance support: No upper extremity supported Sitting balance-Leahy Scale: Fair     Standing balance support: Single extremity supported Standing balance-Leahy Scale: Poor                              Cognition Arousal/Alertness: Awake/alert Behavior During Therapy: Anxious Overall Cognitive Status: Impaired/Different from baseline Area of Impairment: Memory                     Memory: Decreased short-term memory         General Comments: anxious with mobility due to dizziness      Exercises      General Comments General comments (skin integrity, edema, etc.): vss      Pertinent Vitals/Pain Pain Assessment: 0-10 Pain Score: 5  Pain Location: headache Pain Descriptors / Indicators: Headache Pain Intervention(s): RN gave pain meds during session    Home Living                      Prior Function            PT Goals (current goals can  now be found in the care plan section) Acute Rehab PT Goals Patient Stated Goal: stop the dizziness Progress towards PT goals: Progressing toward goals    Frequency    Min 4X/week      PT Plan Current plan remains appropriate    Co-evaluation              AM-PAC PT "6 Clicks" Mobility   Outcome Measure  Help needed turning from your back to your side while in a flat bed without using bedrails?: None Help needed moving from lying on your back to sitting on the side of a flat bed without using bedrails?: None Help needed moving to and from a bed to a chair (including a  wheelchair)?: A Little Help needed standing up from a chair using your arms (e.g., wheelchair or bedside chair)?: A Little Help needed to walk in hospital room?: A Little Help needed climbing 3-5 steps with a railing? : A Little 6 Click Score: 20    End of Session Equipment Utilized During Treatment: Gait belt Activity Tolerance: Patient tolerated treatment well Patient left: in chair;with call bell/phone within reach;with chair alarm set Nurse Communication: Mobility status PT Visit Diagnosis: Unsteadiness on feet (R26.81);Muscle weakness (generalized) (M62.81);Other symptoms and signs involving the nervous system (R29.898);Pain     Time: 4431-5400 PT Time Calculation (min) (ACUTE ONLY): 15 min  Charges:  $Gait Training: 8-22 mins                     Kittie Plater, PT, DPT Acute Rehabilitation Services Pager #: [phone] Office #: [phone]    Berline Lopes 05/16/2018, 2:39 PM

## 2018-05-16 NOTE — Progress Notes (Signed)
Occupational Therapy Treatment Patient Details Name: Logan Phillips MRN: [id number] DOB: 1950-07-22 Today's Date: 05/16/2018    History of present illness Pt is a 68 yo male s/p brain tumor resection who presented with c/o dizziness, gait instability, hearing loss, and GI symptoms. Head CT 2/28-extra-axial blood products and hygroma RIGHT posterior fossa. Mild residual RIGHT brachium pontis edema. PMH: vestibular schwannoma, heavy alcohol use, chronic shoulder Phillips, HTN.   OT comments  This 68 yo male admitted and underwent above presents to acute OT with making progress with basic ADLs and mobility, but still needs A anytime when he is up on his--called dtr who he will be staying with and made her aware and she asked how long he would need 24 hour care. I made her aware that that is hard to say--depends on how he progresses. Dtr reports that they (her and 2 sisters) can probably do this for 1-2 weeks but for more than that unsure due to all 3 of them work full time. I called CM to make her aware of pt's dtr's concerns and CM said she would call dtr, Logan Phillips.   Follow Up Recommendations  Home health OT;Supervision/Assistance - 24 hour    Equipment Recommendations  3 in 1 bedside commode       Precautions / Restrictions Precautions Precautions: Fall Precaution Comments: dizziness with all movement Restrictions Weight Bearing Restrictions: No       Mobility Bed Mobility Overal bed mobility: Needs Assistance Bed Mobility: Supine to Sit;Sit to Supine     Supine to sit: Supervision Sit to supine: Supervision    Transfers Overall transfer level: Needs assistance Equipment used: Rolling walker (2 wheeled) Transfers: Sit to/from Stand Sit to Stand: Min assist         General transfer comment: minA due to dizzines. Pt ambulated 150 feet with RW with min A.     Balance Overall balance assessment: Needs assistance Sitting-balance support: No upper extremity supported Sitting  balance-Leahy Scale: Fair     Standing balance support: Single extremity supported Standing balance-Leahy Scale: Poor                             ADL either performed or assessed with clinical judgement   ADL Overall ADL's : Needs assistance/impaired     Grooming: Wash/dry hands;Wash/dry face;Oral care;Standing;Min guard               Lower Body Dressing: Sit to/from stand;Minimal assistance   Toilet Transfer: Minimal assistance;Ambulation;RW;Grab bars;Comfort height toilet                   Vision Baseline Vision/History: No visual deficits Patient Visual Report: Blurring of vision Vision Assessment?: Yes Eye Alignment: Within Functional Limits Ocular Range of Motion: Within Functional Limits Alignment/Gaze Preference: Within Defined Limits Tracking/Visual Pursuits: Able to track stimulus in all quads without difficulty Saccades: Decreased speed of saccadic movement Additional Comments: Pt unable to close right eye lid. He can manually close it as long as left eye is closed, but as soon as he opens left eye right eye opens too. Provided pt with eye patch today and will continue to address right eye          Cognition Arousal/Alertness: Awake/alert Behavior During Therapy: WFL for tasks assessed/performed Overall Cognitive Status: Impaired/Different from baseline Area of Impairment: Memory  Memory: Decreased short-term memory         General Comments: anxious with mobility due to dizziness              General Comments vss    Pertinent Vitals/ Phillips       Phillips Assessment: No/denies Phillips Phillips Score: 5  Phillips Location: headache Phillips Descriptors / Indicators: Headache Phillips Intervention(s): RN gave Phillips meds during session         Frequency  Min 3X/week        Progress Toward Goals  OT Goals(current goals can now be found in the care plan section)  Progress towards OT goals: Progressing toward  goals  Acute Rehab OT Goals Patient Stated Goal: stop the dizziness  Plan Discharge plan remains appropriate       AM-PAC OT "6 Clicks" Daily Activity     Outcome Measure   Help from another person eating meals?: None Help from another person taking care of personal grooming?: A Little Help from another person toileting, which includes using toliet, bedpan, or urinal?: A Little Help from another person bathing (including washing, rinsing, drying)?: A Little Help from another person to put on and taking off regular upper body clothing?: A Little Help from another person to put on and taking off regular lower body clothing?: A Little 6 Click Score: 19    End of Session Equipment Utilized During Treatment: Gait belt;Rolling walker  OT Visit Diagnosis: Unsteadiness on feet (R26.81);Other abnormalities of gait and mobility (R26.89);Muscle weakness (generalized) (M62.81)   Activity Tolerance Patient tolerated treatment well   Patient Left in bed;with call bell/phone within reach;with bed alarm set(NT in room)   Nurse Communication          Time: 3491-7915 OT Time Calculation (min): 24 min  Charges: OT General Charges $OT Visit: 1 Visit OT Treatments $Self Care/Home Management : 23-37 mins  [place] [address], OTR/L Acute [employer] Pager [phone] Office [phone]      Almon Register 05/16/2018, 4:40 PM

## 2018-05-17 MED ORDER — METHYLPREDNISOLONE 4 MG PO TBPK: ORAL_TABLET | ORAL | 0 refills | Status: DC

## 2018-05-17 MED ORDER — METHOCARBAMOL 750 MG PO TABS: 750.0000 mg | ORAL_TABLET | Freq: Four times a day (QID) | ORAL | 2 refills | Status: DC

## 2018-05-17 MED ORDER — OXYCODONE HCL 5 MG PO TABS: 5.0000 mg | ORAL_TABLET | ORAL | 0 refills | Status: DC | PRN

## 2018-05-17 NOTE — Progress Notes (Addendum)
  NEUROSURGERY PROGRESS NOTE   No issues overnight.  HA much improved with oxycodone No concerns this am  EXAM:  BP 120/88   Pulse (!) 54   Temp 98.4 F (36.9 C) (Oral)   Resp 17   Ht 5\' 7"  (1.702 m)   Wt 70.2 kg   SpO2 100%   BMI 24.24 kg/m   Awake, alert, oriented  Speech fluent, appropriate  Right facial droop 5/5 BUE/BLE  Incision c/d/i  PLAN Stable neurologically Ok for d/c later today  I have seen and examined Logan Phillips and agree with the exam, impression, and plan as documented in the note by Ferne Reus, PA-C.  Doing well, HA better controlled. Stable for d/c with HHPT/OT  Consuella Lose, MD [facility]

## 2018-05-17 NOTE — Progress Notes (Addendum)
Discharge instructions given to patient/daughter Logan Phillips. Questions answered, no printed prescriptions to give (sent to pharmacy electronically). Equipment delivered.  IV removed.  Patient taken to car via wheelchair with all belongings.

## 2018-05-17 NOTE — Progress Notes (Signed)
Occupational Therapy Treatment Patient Details Name: Logan Phillips MRN: [id number] DOB: 07-Jan-1951 Today's Date: 05/17/2018    History of present illness Pt is a 68 yo male s/p brain tumor resection who presented with c/o dizziness, gait instability, hearing loss, and GI symptoms. Head CT 2/28-extra-axial blood products and hygroma RIGHT posterior fossa. Mild residual RIGHT brachium pontis edema. PMH: vestibular schwannoma, heavy alcohol use, chronic shoulder pain, HTN.   OT comments  Pt continuing to present with decreased balance, cognition, and safety. Pt requiring Max A for sitting balance while donning his socks at EOB. Pt requiring Min Guard-Min A for functional mobility with RW. Pt with decreased attention to right side of environment during mobility as seen by pt running into objects. Providing education and handout for eye protection; pt verbalizing understanding. Will continue to follow acutely as admitted and continue to recommend dc with HHOT and 24/7 support.    Follow Up Recommendations  Home health OT;Supervision/Assistance - 24 hour    Equipment Recommendations  3 in 1 bedside commode    Recommendations for Other Services      Precautions / Restrictions Precautions Precautions: Fall Precaution Comments: dizziness with all movement Restrictions Weight Bearing Restrictions: No       Mobility Bed Mobility Overal bed mobility: Needs Assistance Bed Mobility: Supine to Sit;Sit to Supine     Supine to sit: Mod assist     General bed mobility comments: Mod A for elevating trunk and presenting with poor balance  Transfers Overall transfer level: Needs assistance Equipment used: Rolling walker (2 wheeled) Transfers: Sit to/from Stand Sit to Stand: Min assist         General transfer comment: Min A for balance    Balance Overall balance assessment: Needs assistance Sitting-balance support: No upper extremity supported Sitting balance-Leahy Scale:  Fair Sitting balance - Comments: pt able to maintain sitting balance without waivering   Standing balance support: Single extremity supported Standing balance-Leahy Scale: Poor Standing balance comment: Requires RW for support during standing and ambulation                           ADL either performed or assessed with clinical judgement   ADL Overall ADL's : Needs assistance/impaired     Grooming: Oral care;Standing;Min guard;Wash/dry face Grooming Details (indicate cue type and reason): Requiring Min Guard A for safety and presenting with poor balance without UE support. Pt with LOB forward and requiring physical A to correct             Lower Body Dressing: Maximal assistance;Sit to/from stand Lower Body Dressing Details (indicate cue type and reason): Pt requiring Max A sitting balance during donning socks. Pt with poor sitting balance and laterally leaning right.              Functional mobility during ADLs: Rolling walker;Minimal assistance General ADL Comments: Pt continues to present with poor balance, cognition, and safety. Providing pt with handout and education on use of eye protection for decreased blinking reaction of right eye.      Vision   Vision Assessment?: Yes Eye Alignment: Within Functional Limits Ocular Range of Motion: Within Functional Limits Alignment/Gaze Preference: Within Defined Limits Tracking/Visual Pursuits: Able to track stimulus in all quads without difficulty Saccades: Decreased speed of saccadic movement Additional Comments: Providing pt with handout and education on eye protection with glasses, sunglasses, and patch. Reviewing education at end of session as well. Pt also with decreased attention  to right side and running into objects and required cues.   Perception     Praxis      Cognition Arousal/Alertness: Awake/alert Behavior During Therapy: WFL for tasks assessed/performed Overall Cognitive Status:  Impaired/Different from baseline Area of Impairment: Memory;Problem solving;Safety/judgement;Awareness                     Memory: Decreased short-term memory   Safety/Judgement: Decreased awareness of safety Awareness: Emergent Problem Solving: Slow processing;Requires verbal cues General Comments: Pt with poor memory, problem solving, and awareness. Requiring cues throughout for safety during ADLs.         Exercises     Shoulder Instructions       General Comments VSS    Pertinent Vitals/ Pain       Pain Assessment: Faces Faces Pain Scale: Hurts little more Pain Location: headache Pain Descriptors / Indicators: Headache Pain Intervention(s): Monitored during session;Limited activity within patient's tolerance;Repositioned  Home Living                                          Prior Functioning/Environment              Frequency  Min 3X/week        Progress Toward Goals  OT Goals(current goals can now be found in the care plan section)  Progress towards OT goals: Not progressing toward goals - comment(Presenting with poor balance)  Acute Rehab OT Goals Patient Stated Goal: stop the dizziness OT Goal Formulation: With patient Time For Goal Achievement: 05/25/18 Potential to Achieve Goals: Good ADL Goals Pt Will Perform Grooming: with supervision;standing Pt Will Perform Upper Body Dressing: with modified independence;sitting Pt Will Perform Lower Body Dressing: with supervision;sit to/from stand Pt Will Transfer to Toilet: with modified independence;ambulating Pt Will Perform Toileting - Clothing Manipulation and hygiene: sit to/from stand;with supervision  Plan Discharge plan remains appropriate    Co-evaluation                 AM-PAC OT "6 Clicks" Daily Activity     Outcome Measure   Help from another person eating meals?: None Help from another person taking care of personal grooming?: A Little Help from another  person toileting, which includes using toliet, bedpan, or urinal?: A Little Help from another person bathing (including washing, rinsing, drying)?: A Little Help from another person to put on and taking off regular upper body clothing?: A Little Help from another person to put on and taking off regular lower body clothing?: A Lot 6 Click Score: 18    End of Session Equipment Utilized During Treatment: Gait belt;Rolling walker  OT Visit Diagnosis: Unsteadiness on feet (R26.81);Other abnormalities of gait and mobility (R26.89);Muscle weakness (generalized) (M62.81)   Activity Tolerance Patient tolerated treatment well   Patient Left with call bell/phone within reach;in chair;with chair alarm set   Nurse Communication Mobility status        Time: 5053-9767 OT Time Calculation (min): 21 min  Charges: OT General Charges $OT Visit: 1 Visit OT Treatments $Self Care/Home Management : 8-22 mins  [place], OTR/L Acute Rehab Pager: [phone] Office: [place] 05/17/2018, 1:28 PM

## 2018-05-17 NOTE — Care Management Note (Addendum)
Case Management Note  Patient Details  Name: Logan Phillips MRN: [id number] Date of Birth: 06-Feb-1951  Subjective/Objective:  Pt is a 68 yo male s/p brain tumor resection who presented with c/o dizziness, gait instability, hearing loss, and GI symptoms.  PTA, pt independent, lives at home with daughter.                  Action/Plan: PT/OT Recommending HH follow up at discharge.  MD: please leave orders for HHPT/OT follow up, if you agree with assessment.  He will also need RW and 3 in 1 BSC.   Family able to provide assistance at discharge.   Expected Discharge Date:  05/17/18               Expected Discharge Plan:  [place]  In-House Referral:     Discharge planning Services  CM Consult  Post Acute Care Choice:  Home Health Choice offered to:  Patient  DME Arranged:  3-N-1 DME Agency:  [facility]  HH Arranged:  PT, OT, Speech Therapy HH Agency:  [facility]  Status of Service:  In process, will continue to follow  If discussed at Long Length of Stay Meetings, dates discussed:    Additional Comments:  05/16/2018 J. Laray Rivkin, RN, BSN  PT/OT recommending [facility] follow up, and pt agreeable to services.  Referral to [facility], per pt choice; start of care 24-48h post dc date.  Referral to [facility] for DME needs; RW and 3 in 1 to be delivered to bedside prior to dc.  Pt motivated and wants to get back to playing golf.  Pt states he will have assistance from his daughter, Logan Phillips, and roommate Logan Phillips.  Spoke with pt's daughter, Logan Phillips by phone to confirm support at discharge.   She states she and her two sisters are able to provide 24hr care for first two or so weeks, and feels that pt's roommate can assist with this as well long-term, if needed.  He does not work, and is available to help.  She is open to considering paid HHA/sitters if needed, and RN CM will provide resources for available private duty services.  We discussed home care and  DME to be arranged, and she is also in agreement with dc plans.  Daughter states that between family and roommate, pt will have assistance, if required.  They plan to pull together to make this work.   05/17/2018 J. Arles Rumbold, RN, BSN Pt medically stable for discharge home today with family and [facility] services as arranged.  Pt states ex wife has located a RW he can use at home, and he will not need RW.  3 in 1 BSC to be delivered prior to dc.  Notified [facility] of pt dc today.  DC address:  [address] [place], [place] [address]  Phone:  [phone]  Reinaldo Raddle, RN, BSN  Trauma/Neuro ICU Case Manager [phone]

## 2018-05-17 NOTE — Discharge Summary (Signed)
Physician Discharge Summary  Patient ID: Logan Phillips MRN: [id number] DOB/AGE: 67-Nov-1952 67 y.o.  Admit date: 05/09/2018 Discharge date: 05/17/2018  Admission Diagnoses:  Brain tumor  Discharge Diagnoses:  Same Active Problems:   Brain tumor [facility])  Discharged Condition: Stable  Hospital Course:  Logan Phillips is a 68 y.o. male who was admitted for the below procedure. There were no post operative complications. At time of discharge, pain was well controlled, ambulating with Pt/OT, tolerating po, voiding normal. Ready for discharge.  Treatments: Surgery 1. 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 Use of intraoperative cranial nerve monitoring 3. Use of intraoperative microscope for microdissection  Discharge Exam: Blood pressure 120/88, pulse (!) 54, temperature 98.4 F (36.9 C), temperature source Oral, resp. rate 17, height 5\' 7"  (1.702 m), weight 70.2 kg, SpO2 100 %. Awake, alert, oriented Speech fluent, appropriate Right facial droop 5/5 BUE/BLE Wound c/d/i  Disposition: Discharge disposition: 01-Home or Self Care       Discharge Instructions    Ambulatory referral to [facility]   Complete by:  As directed    Please evaluate Logan Phillips for admission to [facility].  Disciplines requested: Physical Therapy, Occupational Therapy and Speech Language Pathology  Services to provide: Evaluate  Physician to follow patient's care (the person listed here will be responsible for signing ongoing orders): Referring Provider  Requested Start of Care Date: Within 2-3 days  I certify that this patient is under my care and that I, or a Nurse Practitioner or Physician's Assistant working with me, had a face-to-face encounter that meets the physician face-to-face requirements with patient on 3/32020. The encounter with the patient was in whole, or in part for the following medical condition(s) which is the primary reason for home  health care (List medical condition). Gait instability  Special Instructions:   Does the patient have Medicare or Medicaid?:  Yes   The encounter with the patient was in whole, or in part, for the following medical condition, which is the primary reason for home health care:  gait instability   Reason for Medically [place]:   Therapy- Personnel officer, Public librarian Therapy- Instruction on use of Assistive Device for Ambulation on all Surfaces Therapy- Instruction on Safe use of Assistive Devices for ADLs Therapy- Home Adaptation to Facilitate Safety Therapy- Therapeutic Exercises to Increase Strength and Endurance Therapy- Skilled Evaluation of Speech Comprehension and Safe Swallowing     My clinical findings support the need for the above services:  Unable to leave home safely without assistance and/or assistive device   I certify that, based on my findings, the following services are medically necessary home health services:   Physical therapy Speech language pathology     Further, I certify that my clinical findings support that this patient is homebound due to:  Unsafe ambulation due to balance issues   Call MD for:  difficulty breathing, headache or visual disturbances   Complete by:  As directed    Call MD for:  persistant dizziness or light-headedness   Complete by:  As directed    Call MD for:  redness, tenderness, or signs of infection (pain, swelling, redness, odor or green/yellow discharge around incision site)   Complete by:  As directed    Call MD for:  severe uncontrolled pain   Complete by:  As directed    Call MD for:  temperature >100.4   Complete by:  As directed  Diet general   Complete by:  As directed    Driving Restrictions   Complete by:  As directed    Do not drive until given clearance.   Increase activity slowly   Complete by:  As directed    Lifting restrictions   Complete by:  As directed    Do not lift  anything >10lbs. Avoid bending and twisting in awkward positions. Avoid bending at the back.   May shower / Bathe   Complete by:  As directed    In 24 hours. Okay to wash wound with warm soapy water. Avoid scrubbing the wound. Pat dry.   Remove dressing in 24 hours   Complete by:  As directed      Allergies as of 05/17/2018   No Known Allergies     Medication List    TAKE these medications   acetaminophen 650 MG CR tablet Commonly known as:  TYLENOL 8 HOUR Take 1 tablet (650 mg total) by mouth every 8 (eight) hours as needed for pain.   CALCIUM 500 + D 500-125 MG-UNIT Tabs Generic drug:  Calcium Carbonate-Vitamin D Take 1 tablet by mouth daily.   fish oil-omega-3 fatty acids 1000 MG capsule Take 1 g by mouth daily.   lisinopril 20 MG tablet Commonly known as:  PRINIVIL,ZESTRIL Take 1 tablet (20 mg total) by mouth daily.   magnesium oxide 400 MG tablet Commonly known as:  MAG-OX Take 400 mg by mouth 2 (two) times daily.   methocarbamol 750 MG tablet Commonly known as:  ROBAXIN Take 1 tablet (750 mg total) by mouth 4 (four) times daily.   methylPREDNISolone 4 MG Tbpk tablet Commonly known as:  MEDROL Take according to package insert   metoprolol tartrate 100 MG tablet Commonly known as:  LOPRESSOR Take 1 tablet (100 mg total) by mouth 2 (two) times daily.   milk thistle 175 MG tablet Take 175 mg by mouth daily.   multivitamin with minerals Tabs tablet Take 1 tablet by mouth daily.   omeprazole 40 MG capsule Commonly known as:  PRILOSEC Take 1 capsule (40 mg total) by mouth daily.   oxyCODONE 5 MG immediate release tablet Commonly known as:  ROXICODONE Take 1 tablet (5 mg total) by mouth every 4 (four) hours as needed for severe pain.   Potassium 75 MG Tabs Take 1 tablet by mouth daily.   pravastatin 20 MG tablet Commonly known as:  PRAVACHOL Take 1 tablet (20 mg total) by mouth every evening.   SUPER B COMPLEX/C PO Take 1 tablet by mouth daily.   zinc  gluconate 50 MG tablet Take 50 mg by mouth daily.            Durable Medical Equipment  (From admission, onward)         Start     Ordered   05/16/18 1510  For home use only DME Bedside commode  Once    Question:  Patient needs a bedside commode to treat with the following condition  Answer:  Brain tumor ([place])   05/16/18 1509   05/16/18 1509  For home use only DME Walker rolling  Once    Question:  Patient needs a walker to treat with the following condition  Answer:  Brain tumor ([place])   05/16/18 [place] Follow up.   Why:  Phone: [phone]  Physical, occupational and speech therapies to follow up with you  at home.       Consuella Lose, MD. Schedule an appointment as soon as possible for a visit in 3 week(s).   Specialty:  Neurosurgery Contact information: [address] [place] [address] [phone]           Signed: Traci Sermon 05/17/2018, 7:15 AM

## 2018-05-18 ENCOUNTER — Telehealth: Payer: Self-pay | Admitting: Family Medicine

## 2018-05-18 ENCOUNTER — Telehealth: Payer: Self-pay | Admitting: Medical

## 2018-05-18 DIAGNOSIS — D3610 Benign neoplasm of peripheral nerves and autonomic nervous system, unspecified: Secondary | ICD-10-CM | POA: Diagnosis not present

## 2018-05-18 DIAGNOSIS — H9193 Unspecified hearing loss, bilateral: Secondary | ICD-10-CM | POA: Diagnosis not present

## 2018-05-18 DIAGNOSIS — Z48811 Encounter for surgical aftercare following surgery on the nervous system: Secondary | ICD-10-CM | POA: Diagnosis not present

## 2018-05-18 DIAGNOSIS — R69 Illness, unspecified: Secondary | ICD-10-CM | POA: Diagnosis not present

## 2018-05-18 DIAGNOSIS — R1310 Dysphagia, unspecified: Secondary | ICD-10-CM | POA: Diagnosis not present

## 2018-05-18 DIAGNOSIS — M1991 Primary osteoarthritis, unspecified site: Secondary | ICD-10-CM | POA: Diagnosis not present

## 2018-05-18 DIAGNOSIS — K219 Gastro-esophageal reflux disease without esophagitis: Secondary | ICD-10-CM | POA: Diagnosis not present

## 2018-05-18 NOTE — Telephone Encounter (Signed)
Pt's daughter informed.  During call she stated that pt's BP is running low without his bp meds. Could not recall what it was but wants to know if he should continue to take bp meds.

## 2018-05-18 NOTE — Telephone Encounter (Signed)
Pt's daughter called and stated that father is finally at home. He is having some tooth pain due to a broken tooth and would like to use Oragel. She is asking if that is OK due to all the medications pt is on. Please advise at [phone].

## 2018-05-18 NOTE — Telephone Encounter (Signed)
oragel topical should be fine  Glad he is home and made it ok through surgery

## 2018-05-18 NOTE — Telephone Encounter (Signed)
If readings are <105/65, then stop Lisinopril and use 1/2 dose of Metoprolol.   Then, continue to monitor BPs over the next week and give update on BP in 1 week

## 2018-05-18 NOTE — Telephone Encounter (Signed)
ok 

## 2018-05-18 NOTE — Telephone Encounter (Signed)
Chris with [phone] called and states pt need physical therapy at home for 8 weeks at one time per week.

## 2018-05-19 DIAGNOSIS — H9193 Unspecified hearing loss, bilateral: Secondary | ICD-10-CM | POA: Diagnosis not present

## 2018-05-19 DIAGNOSIS — K219 Gastro-esophageal reflux disease without esophagitis: Secondary | ICD-10-CM | POA: Diagnosis not present

## 2018-05-19 DIAGNOSIS — D3610 Benign neoplasm of peripheral nerves and autonomic nervous system, unspecified: Secondary | ICD-10-CM | POA: Diagnosis not present

## 2018-05-19 DIAGNOSIS — M1991 Primary osteoarthritis, unspecified site: Secondary | ICD-10-CM | POA: Diagnosis not present

## 2018-05-19 DIAGNOSIS — R1310 Dysphagia, unspecified: Secondary | ICD-10-CM | POA: Diagnosis not present

## 2018-05-19 DIAGNOSIS — R69 Illness, unspecified: Secondary | ICD-10-CM | POA: Diagnosis not present

## 2018-05-19 DIAGNOSIS — Z48811 Encounter for surgical aftercare following surgery on the nervous system: Secondary | ICD-10-CM | POA: Diagnosis not present

## 2018-05-19 NOTE — Telephone Encounter (Signed)
Done KH 

## 2018-05-19 NOTE — Telephone Encounter (Signed)
Patient daughter called and stated patient is in so much pain. She stated patient just had brain surgery and the pain is so severe, he in tears. Daughter wants to know if patient can be referred to pain management. Please advise.

## 2018-05-19 NOTE — Telephone Encounter (Signed)
Patient informed. 

## 2018-05-19 NOTE — Telephone Encounter (Signed)
He needs to call the surgeon's office and ask for help on what he is suppose to do?

## 2018-05-20 ENCOUNTER — Other Ambulatory Visit: Payer: Self-pay

## 2018-05-20 ENCOUNTER — Other Ambulatory Visit: Payer: Self-pay | Admitting: Neurological Surgery

## 2018-05-20 ENCOUNTER — Encounter (HOSPITAL_COMMUNITY): Payer: Self-pay | Admitting: Emergency Medicine

## 2018-05-20 ENCOUNTER — Emergency Department (HOSPITAL_COMMUNITY)
Admission: EM | Admit: 2018-05-20 | Discharge: 2018-05-20 | Disposition: A | Discharge status: Home / Self Care | Payer: Medicare HMO | Attending: Emergency Medicine | Admitting: Emergency Medicine

## 2018-05-20 ENCOUNTER — Emergency Department (HOSPITAL_COMMUNITY): Payer: Medicare HMO

## 2018-05-20 DIAGNOSIS — I10 Essential (primary) hypertension: Secondary | ICD-10-CM | POA: Diagnosis not present

## 2018-05-20 DIAGNOSIS — G9782 Other postprocedural complications and disorders of nervous system: Secondary | ICD-10-CM | POA: Insufficient documentation

## 2018-05-20 DIAGNOSIS — R42 Dizziness and giddiness: Secondary | ICD-10-CM | POA: Diagnosis not present

## 2018-05-20 DIAGNOSIS — Z79899 Other long term (current) drug therapy: Secondary | ICD-10-CM | POA: Insufficient documentation

## 2018-05-20 DIAGNOSIS — D333 Benign neoplasm of cranial nerves: Secondary | ICD-10-CM

## 2018-05-20 LAB — BASIC METABOLIC PANEL
Anion gap: 9 (ref 5–15)
BUN: 16 mg/dL (ref 8–23)
CO2: 25 mmol/L (ref 22–32)
Calcium: 9.2 mg/dL (ref 8.9–10.3)
Chloride: 99 mmol/L (ref 98–111)
Creatinine, Ser: 1.09 mg/dL (ref 0.61–1.24)
GFR calc Af Amer: >60 mL/min (ref >60)
GFR calc non Af Amer: >60 mL/min (ref >60)
Glucose, Bld: 125 mg/dL — ABNORMAL HIGH (ref 70–99)
Potassium: 4 mmol/L (ref 3.5–5.1)
Sodium: 133 mmol/L — ABNORMAL LOW (ref 135–145)

## 2018-05-20 LAB — CBC
HEMATOCRIT: 44.3 % (ref 39.0–52.0)
Hemoglobin: 15.9 g/dL (ref 13.0–17.0)
MCH: 30.2 pg (ref 26.0–34.0)
MCHC: 35.9 g/dL (ref 30.0–36.0)
MCV: 84.1 fL (ref 80.0–100.0)
Platelets: 246 10*3/uL (ref 150–400)
RBC: 5.27 MIL/uL (ref 4.22–5.81)
RDW: 11.9 % (ref 11.5–15.5)
WBC: 10 10*3/uL (ref 4.0–10.5)
nRBC: 0 % (ref 0.0–0.2)

## 2018-05-20 MED ORDER — HYDROMORPHONE HCL 1 MG/ML IJ SOLN
1.0000 mg | Freq: Once | INTRAMUSCULAR | Status: DC
  Filled 2018-05-20: qty 1

## 2018-05-20 NOTE — ED Notes (Signed)
Pt verbalized understanding of discharge paperwork and follow-up care.  °

## 2018-05-20 NOTE — ED Provider Notes (Signed)
[facility] EMERGENCY DEPARTMENT Provider Note   CSN: [id number] Arrival date & time: 05/20/18  0850    History   Chief Complaint Chief Complaint  Patient presents with  . Dizziness    HPI Logan Phillips is a 68 y.o. male.     HPI Pt states he had surgery on February 25 for resection of an acoustic neuroma..  Pt has been feeling dizzy, weak and has no energy.  He continues to have a severe headache on the right side of his head.  He is taking pain medications without relief.  He was having that issue when he left the hospital.  Pt does not feel like he is getting better.  Pt called the surgeon on call.  He was told to come to the ED to get a CT scan. Past Medical History:  Diagnosis Date  . Chronic pain in shoulder 2018   [place]  . H/O echocardiogram 10/08/2016   EF 65-70%, normal wall motion, systolic function vigorous  . Hearing loss    pending hearing aids 06/2014  . Hypertension   . Lipoma   . Varicose vein   . Vestibular schwannoma ([place]) 04/2018    Patient Active Problem List   Diagnosis Date Noted  . Brain tumor ([place]) 05/09/2018  . Schwannoma 04/18/2018  . Acute ischemic colitis ([place]) 04/09/2018  . Arthritis 02/22/2018  . Alcohol consumption heavy 02/22/2018  . Gastroesophageal reflux disease 02/22/2018  . Skin lesion 10/05/2017  . Ataxia 10/05/2017  . Dizziness 10/05/2017  . Memory change 10/05/2017  . Chronic right shoulder pain 03/18/2017  . Screening for diabetes mellitus 10/04/2016  . Vaccine counseling 10/04/2016  . Encounter for health maintenance examination in adult 10/04/2016  . Need for pneumococcal vaccination 10/04/2016  . Lipoma of torso 10/04/2016  . Hearing difficulty of both ears 10/04/2016  . Abnormal EKG 07/23/2014  . Varicose veins of left lower extremity   . Essential hypertension 07/08/2014  . Foot pain, left 04/14/2011    Past Surgical History:  Procedure Laterality Date  . APPENDECTOMY      . APPLICATION OF CRANIAL NAVIGATION Right 05/09/2018   Procedure: APPLICATION OF CRANIAL NAVIGATION;  Surgeon: Consuella Lose, MD;  Location: [facility];  Service: Neurosurgery;  Laterality: Right;  . BIOPSY  04/12/2018   Procedure: BIOPSY;  Surgeon: Juanita Craver, MD;  Location: [facility] ENDOSCOPY;  Service: Endoscopy;;  . COLONOSCOPY     age 47, he did not f/u with referral 2018  . COLONOSCOPY WITH PROPOFOL N/A 04/12/2018   Procedure: COLONOSCOPY WITH PROPOFOL;  Surgeon: Juanita Craver, MD;  Location: [facility] ENDOSCOPY;  Service: Endoscopy;  Laterality: N/A;  . CRANIOTOMY Right 05/09/2018   Procedure: Right retrosigmoid craniectomy, resection of acoustic neuroma with intraoperative facial monitoring/Brain Lab;  Surgeon: Consuella Lose, MD;  Location: [facility];  Service: Neurosurgery;  Laterality: Right;  . LUMBAR DISC SURGERY  2002   Dr. Rita Ohara  . WISDOM TOOTH EXTRACTION          Home Medications    Prior to Admission medications   Medication Sig Start Date End Date Taking? Authorizing Provider  acetaminophen (TYLENOL 8 HOUR) 650 MG CR tablet Take 1 tablet (650 mg total) by mouth every 8 (eight) hours as needed for pain. 04/18/18   Tysinger, Camelia Eng, PA-C  Calcium Carbonate-Vitamin D (CALCIUM 500 + D) 500-125 MG-UNIT TABS Take 1 tablet by mouth daily.     [provider]  fish oil-omega-3 fatty acids 1000 MG capsule  Take 1 g by mouth daily.    [provider]  lisinopril (PRINIVIL,ZESTRIL) 20 MG tablet Take 1 tablet (20 mg total) by mouth daily. 10/06/17   Tysinger, Camelia Eng, PA-C  magnesium oxide (MAG-OX) 400 MG tablet Take 400 mg by mouth 2 (two) times daily.    [provider]  methocarbamol (ROBAXIN) 750 MG tablet Take 1 tablet (750 mg total) by mouth 4 (four) times daily. 05/17/18   Costella, Vista Mink, PA-C  methylPREDNISolone (MEDROL) 4 MG TBPK tablet Take according to package insert 05/17/18   Costella, Vista Mink, PA-C  metoprolol tartrate (LOPRESSOR) 100 MG tablet Take 1  tablet (100 mg total) by mouth 2 (two) times daily. 10/06/17   Tysinger, Camelia Eng, PA-C  milk thistle 175 MG tablet Take 175 mg by mouth daily.    [provider]  Multiple Vitamin (MULITIVITAMIN WITH MINERALS) TABS Take 1 tablet by mouth daily.    [provider]  omeprazole (PRILOSEC) 40 MG capsule Take 1 capsule (40 mg total) by mouth daily. Patient not taking: Reported on 04/18/2018 02/22/18   Tysinger, Camelia Eng, PA-C  oxyCODONE (ROXICODONE) 5 MG immediate release tablet Take 1 tablet (5 mg total) by mouth every 4 (four) hours as needed for severe pain. 05/17/18   Costella, Vista Mink, PA-C  Potassium 75 MG TABS Take 1 tablet by mouth daily.     [provider]  pravastatin (PRAVACHOL) 20 MG tablet Take 1 tablet (20 mg total) by mouth every evening. Patient not taking: Reported on 04/18/2018 10/06/17 10/06/18  Tysinger, Camelia Eng, PA-C  SUPER B COMPLEX/C PO Take 1 tablet by mouth daily.     [provider]  zinc gluconate 50 MG tablet Take 50 mg by mouth daily.    [provider]    Family History Family History  Problem Relation Age of Onset  . Hypertension Mother   . Dementia Father   . Parkinsonism Father   . Diabetes Father        early stages  . Cancer Maternal Grandmother        breast  . Heart disease Neg Hx   . Stroke Neg Hx     Social History Social History   Tobacco Use  . Smoking status: Never Smoker  . Smokeless tobacco: Never Used  Substance Use Topics  . Alcohol use: Not Currently    Alcohol/week: 32.0 standard drinks    Types: 30 Cans of beer, 2 Shots of liquor per week    Comment: stopped 3 weeks ago 04/12/2018  . Drug use: No     Allergies   Patient has no known allergies.   Review of Systems Review of Systems  All other systems reviewed and are negative.    Physical Exam Updated Vital Signs BP (!) 136/93 (BP Location: Right Arm)   Pulse 64   Temp (!) 97.4 F (36.3 C) (Oral)   Resp 16   Ht 1.702 m (5\' 7" )    Wt 72.6 kg   SpO2 99%   BMI 25.06 kg/m   Physical Exam Vitals signs and nursing note reviewed.  Constitutional:      General: He is not in acute distress.    Appearance: Normal appearance. He is well-developed. He is not diaphoretic.  HENT:     Head: Normocephalic. No raccoon eyes or Battle's sign.     Comments: Status post right-sided craniotomy, wound edges without erythema or drainage    Right Ear: External ear normal.  Left Ear: External ear normal.  Eyes:     General: Lids are normal.        Right eye: No discharge.     Conjunctiva/sclera:     Right eye: No hemorrhage.    Left eye: No hemorrhage. Neck:     Musculoskeletal: No edema or spinous process tenderness.     Trachea: No tracheal deviation.  Cardiovascular:     Rate and Rhythm: Normal rate and regular rhythm.     Heart sounds: Normal heart sounds.  Pulmonary:     Effort: Pulmonary effort is normal. No respiratory distress.     Breath sounds: Normal breath sounds. No stridor.  Chest:     Chest wall: No deformity, tenderness or crepitus.  Abdominal:     General: Bowel sounds are normal. There is no distension.     Palpations: Abdomen is soft. There is no mass.     Tenderness: There is no abdominal tenderness.     Comments: Negative for seat belt sign  Musculoskeletal:     Cervical back: He exhibits no tenderness, no swelling and no deformity.     Thoracic back: He exhibits no tenderness, no swelling and no deformity.     Lumbar back: He exhibits no tenderness and no swelling.     Comments: Pelvis stable, no ttp  Neurological:     Mental Status: He is alert.     GCS: GCS eye subscore is 4. GCS verbal subscore is 5. GCS motor subscore is 6.     Cranial Nerves: Cranial nerve deficit present.     Sensory: No sensory deficit.     Motor: No abnormal muscle tone.     Comments: Able to move all extremities, sensation intact throughout, right-sided facial droop  Psychiatric:        Speech: Speech normal.         Behavior: Behavior normal.      ED Treatments / Results  Labs (all labs ordered are listed, but only abnormal results are displayed) Labs Reviewed  BASIC METABOLIC PANEL - Abnormal; Notable for the following components:      Result Value   Sodium 133 (*)    Glucose, Bld 125 (*)    All other components within normal limits  CBC    EKG None  Radiology Ct Head Wo Contrast  Result Date: 05/20/2018 CLINICAL DATA:  Dizziness.  Vestibular schwannoma surgery 05/09/2018 EXAM: CT HEAD WITHOUT CONTRAST TECHNIQUE: Contiguous axial images were obtained from the base of the skull through the vertex without intravenous contrast. COMPARISON:  Head CT 05/12/2018 FINDINGS: Brain: Decreasing high-density blood clot in the right posterior fossa where there is small volume residual hygroma. Associated mass effect on the brainstem and right cerebellum is diminished. There is continued low-density appearance of the right brachium pontis. Lateral and third ventriculomegaly out of proportion to fourth ventricular size, but unchanged from preoperative CT, arguing against postoperative obstructive hydrocephalus. The fourth ventricle is also not particularly distorted by the collection. Stable mild low-density around the lateral ventricles. No evidence of acute infarct or acute hemorrhage. Vascular: No hyperdense vessel. Skull: Right retromastoid craniectomy with plating. No unexpected finding Sinuses/Orbits: No significant finding Other: Dermal inclusion cyst in the right sided scalp. IMPRESSION: 1. Decreasing postoperative blood products and hygroma in the right CP angle cistern. 2. Lateral and third ventriculomegaly that is unchanged from preoperative imaging. 3. Residual edema likely present in the right brachium pontis. Electronically Signed   By: Neva Seat.D.  On: 05/20/2018 10:35    Procedures Procedures (including critical care time)  Medications Ordered in ED Medications  HYDROmorphone (DILAUDID)  injection 1 mg (has no administration in time range)     Initial Impression / Assessment and Plan / ED Course  I have reviewed the triage vital signs and the nursing notes.  Pertinent labs & imaging results that were available during my care of the patient were reviewed by me and considered in my medical decision making (see chart for details).   Patient's laboratory tests are normal.  CT scan shows continuing resolution of postoperative blood and hygroma following his surgery.  No signs of any acute complication.  I notified  Dr. Venetia Constable who is in the operating room.  Patient is stable for discharge and outpatient follow-up.  Final Clinical Impressions(s) / ED Diagnoses   Final diagnoses:  Postoperative surgical complication involving nervous system associated with nervous system procedure, unspecified complication    ED Discharge Orders    None       Dorie Rank, MD 05/20/18 1141

## 2018-05-20 NOTE — ED Triage Notes (Signed)
Pt had surgery on the 05/10/18 for a brain tumor.  Pt comes [place] by recommendation of his surgeon to get another CT head scan done for dizziness.  Pt AOx4 complains of dizziness.  NAD noted at this time.

## 2018-05-20 NOTE — Discharge Instructions (Addendum)
Continue to take your current medications, follow-up with your neurosurgeon as planned

## 2018-05-22 DIAGNOSIS — R69 Illness, unspecified: Secondary | ICD-10-CM | POA: Diagnosis not present

## 2018-05-23 ENCOUNTER — Telehealth: Payer: Self-pay | Admitting: Medical

## 2018-05-23 DIAGNOSIS — R1310 Dysphagia, unspecified: Secondary | ICD-10-CM | POA: Diagnosis not present

## 2018-05-23 DIAGNOSIS — R69 Illness, unspecified: Secondary | ICD-10-CM | POA: Diagnosis not present

## 2018-05-23 DIAGNOSIS — Z48811 Encounter for surgical aftercare following surgery on the nervous system: Secondary | ICD-10-CM | POA: Diagnosis not present

## 2018-05-23 DIAGNOSIS — D3610 Benign neoplasm of peripheral nerves and autonomic nervous system, unspecified: Secondary | ICD-10-CM | POA: Diagnosis not present

## 2018-05-23 DIAGNOSIS — M1991 Primary osteoarthritis, unspecified site: Secondary | ICD-10-CM | POA: Diagnosis not present

## 2018-05-23 DIAGNOSIS — K219 Gastro-esophageal reflux disease without esophagitis: Secondary | ICD-10-CM | POA: Diagnosis not present

## 2018-05-23 DIAGNOSIS — H9193 Unspecified hearing loss, bilateral: Secondary | ICD-10-CM | POA: Diagnosis not present

## 2018-05-23 NOTE — Telephone Encounter (Signed)
Brandon from [facility] called requesting orders for pt to received occupational therapy orders thru them. He can be reached at [phone].

## 2018-05-23 NOTE — Telephone Encounter (Signed)
Fine with me unless neurosurgery already did this

## 2018-05-23 NOTE — Telephone Encounter (Signed)
Spoke with Erlene Quan and notified that we can order occupational therapy with them.

## 2018-05-24 DIAGNOSIS — R1310 Dysphagia, unspecified: Secondary | ICD-10-CM | POA: Diagnosis not present

## 2018-05-24 DIAGNOSIS — Z48811 Encounter for surgical aftercare following surgery on the nervous system: Secondary | ICD-10-CM | POA: Diagnosis not present

## 2018-05-24 DIAGNOSIS — M1991 Primary osteoarthritis, unspecified site: Secondary | ICD-10-CM | POA: Diagnosis not present

## 2018-05-24 DIAGNOSIS — R69 Illness, unspecified: Secondary | ICD-10-CM | POA: Diagnosis not present

## 2018-05-24 DIAGNOSIS — H9193 Unspecified hearing loss, bilateral: Secondary | ICD-10-CM | POA: Diagnosis not present

## 2018-05-24 DIAGNOSIS — D3610 Benign neoplasm of peripheral nerves and autonomic nervous system, unspecified: Secondary | ICD-10-CM | POA: Diagnosis not present

## 2018-05-24 DIAGNOSIS — K219 Gastro-esophageal reflux disease without esophagitis: Secondary | ICD-10-CM | POA: Diagnosis not present

## 2018-05-24 NOTE — Addendum Note (Signed)
Addended by: Carlena Hurl on: 05/24/2018 09:54 PM   Modules accepted: Level of Service

## 2018-05-25 DIAGNOSIS — M1991 Primary osteoarthritis, unspecified site: Secondary | ICD-10-CM | POA: Diagnosis not present

## 2018-05-25 DIAGNOSIS — K219 Gastro-esophageal reflux disease without esophagitis: Secondary | ICD-10-CM | POA: Diagnosis not present

## 2018-05-25 DIAGNOSIS — R1310 Dysphagia, unspecified: Secondary | ICD-10-CM | POA: Diagnosis not present

## 2018-05-25 DIAGNOSIS — R69 Illness, unspecified: Secondary | ICD-10-CM | POA: Diagnosis not present

## 2018-05-25 DIAGNOSIS — D3610 Benign neoplasm of peripheral nerves and autonomic nervous system, unspecified: Secondary | ICD-10-CM | POA: Diagnosis not present

## 2018-05-25 DIAGNOSIS — H9193 Unspecified hearing loss, bilateral: Secondary | ICD-10-CM | POA: Diagnosis not present

## 2018-05-25 DIAGNOSIS — Z48811 Encounter for surgical aftercare following surgery on the nervous system: Secondary | ICD-10-CM | POA: Diagnosis not present

## 2018-05-29 ENCOUNTER — Telehealth: Payer: Self-pay | Admitting: Medical

## 2018-05-29 ENCOUNTER — Telehealth: Payer: Self-pay

## 2018-05-29 ENCOUNTER — Other Ambulatory Visit: Payer: Self-pay | Admitting: Medical

## 2018-05-29 DIAGNOSIS — M1991 Primary osteoarthritis, unspecified site: Secondary | ICD-10-CM | POA: Diagnosis not present

## 2018-05-29 DIAGNOSIS — K219 Gastro-esophageal reflux disease without esophagitis: Secondary | ICD-10-CM | POA: Diagnosis not present

## 2018-05-29 DIAGNOSIS — Z48811 Encounter for surgical aftercare following surgery on the nervous system: Secondary | ICD-10-CM | POA: Diagnosis not present

## 2018-05-29 DIAGNOSIS — D3610 Benign neoplasm of peripheral nerves and autonomic nervous system, unspecified: Secondary | ICD-10-CM | POA: Diagnosis not present

## 2018-05-29 DIAGNOSIS — H9193 Unspecified hearing loss, bilateral: Secondary | ICD-10-CM | POA: Diagnosis not present

## 2018-05-29 DIAGNOSIS — R69 Illness, unspecified: Secondary | ICD-10-CM | POA: Diagnosis not present

## 2018-05-29 DIAGNOSIS — R1310 Dysphagia, unspecified: Secondary | ICD-10-CM | POA: Diagnosis not present

## 2018-05-29 MED ORDER — BENZONATATE 100 MG PO CAPS: 100.0000 mg | ORAL_CAPSULE | Freq: Three times a day (TID) | ORAL | 0 refills | Status: DC | PRN

## 2018-05-29 NOTE — Telephone Encounter (Signed)
Those are actually lower readings.  Verify is he taking lisinopril 20 mg daily and metoprolol 100 mg twice a day consistently the last week?  If not, what has he been doing in terms of medications for blood pressure?  I will need to know this to give some recommendations

## 2018-05-29 NOTE — Telephone Encounter (Signed)
Pt called and is requesting some cough syrup, states he was up all night with a bad cough, states he can not come in states he was just diginosed with a brain tumor and can not come in for a appt, pt uses [facility], No Name pt can be reached at [phone]

## 2018-05-29 NOTE — Telephone Encounter (Signed)
Patient daughter called and stated patient blood pressure was 98/64 on yesterday around 4pm and bp was 100/68 today. She is concerned that his blood pressure is too low because it usually runs abnormally high. Daughter was made aware that the readings were not abnormal reading however a message would still be sent back due to her concern.   [phone] Lysbeth Galas (daughter)

## 2018-05-29 NOTE — Telephone Encounter (Signed)
CALLED and informed that that rx was sent in

## 2018-05-29 NOTE — Telephone Encounter (Signed)
Tessalon cough medication sent in.  If worse in the week and not improving call back

## 2018-05-30 NOTE — Telephone Encounter (Signed)
I recommend cutting Lisinopril in half for now, monitor BPs, and call back Friday with BP update

## 2018-05-30 NOTE — Telephone Encounter (Signed)
Patient daughter was informed of change and to call us with an update.

## 2018-05-30 NOTE — Telephone Encounter (Signed)
Logan Phillips (patient daughter) stated patient has been taking Lisinopril 20 mg daily and Metoprolol 100 mg  twice daily.

## 2018-05-31 ENCOUNTER — Telehealth: Payer: Self-pay

## 2018-05-31 DIAGNOSIS — K219 Gastro-esophageal reflux disease without esophagitis: Secondary | ICD-10-CM | POA: Diagnosis not present

## 2018-05-31 DIAGNOSIS — D3610 Benign neoplasm of peripheral nerves and autonomic nervous system, unspecified: Secondary | ICD-10-CM | POA: Diagnosis not present

## 2018-05-31 DIAGNOSIS — H9193 Unspecified hearing loss, bilateral: Secondary | ICD-10-CM | POA: Diagnosis not present

## 2018-05-31 DIAGNOSIS — Z48811 Encounter for surgical aftercare following surgery on the nervous system: Secondary | ICD-10-CM | POA: Diagnosis not present

## 2018-05-31 DIAGNOSIS — R69 Illness, unspecified: Secondary | ICD-10-CM | POA: Diagnosis not present

## 2018-05-31 DIAGNOSIS — M1991 Primary osteoarthritis, unspecified site: Secondary | ICD-10-CM | POA: Diagnosis not present

## 2018-05-31 DIAGNOSIS — R1310 Dysphagia, unspecified: Secondary | ICD-10-CM | POA: Diagnosis not present

## 2018-05-31 NOTE — Telephone Encounter (Signed)
yesterday I had advised to decreased Lisinopril to 1/2 dose.   At this point STOP Lisinopril and cut  Metoprolol in 1/2.   Call in with new readings by Friday  Is he acting normal, talking/conversing normal?  How is his hydration?

## 2018-05-31 NOTE — Telephone Encounter (Signed)
Patient is not having any trouble with conversation.  Patient is not drinking enough fluids advised to start drinking more fluids.

## 2018-05-31 NOTE — Telephone Encounter (Signed)
Ebony Hail, RN speech, notified of the changes.   Daughter also notified of changes.

## 2018-05-31 NOTE — Telephone Encounter (Signed)
Left message on voicemail for patient or patient's daughter to call back.

## 2018-05-31 NOTE — Telephone Encounter (Signed)
Zee at [facility] called and states that patient BP is low top #98, dizziness with standing, faint pulse.  She is unable to get orthostatics.  Please call his daughter Logan Phillips with any changes in treatment.

## 2018-06-01 ENCOUNTER — Telehealth: Payer: Self-pay

## 2018-06-01 NOTE — Telephone Encounter (Signed)
Logan Phillips called from [facility] and stated after review, patient is not a candidate to seek pain management at their clinic. This is due to heavy alcohol abuse and admitting to getting narcotic from a friend that was noted in his chart. They do not feel comfortable sending prescribing any other narcotic nor do they feel this would be safe for the patient.

## 2018-06-02 NOTE — Telephone Encounter (Signed)
Please let patient know that the pain clinic declined to take him own as a patient.   He may want to recheck with neurosurgery regarding pain or we can refer to neurology for pain in general

## 2018-06-02 NOTE — Telephone Encounter (Signed)
Patient notified

## 2018-07-13 ENCOUNTER — Encounter: Payer: Self-pay | Admitting: Medical

## 2018-07-13 ENCOUNTER — Other Ambulatory Visit: Payer: Self-pay

## 2018-07-13 ENCOUNTER — Telehealth: Payer: Self-pay | Admitting: Medical

## 2018-07-13 ENCOUNTER — Ambulatory Visit (INDEPENDENT_AMBULATORY_CARE_PROVIDER_SITE_OTHER): Payer: Medicare HMO | Admitting: Medical

## 2018-07-13 VITALS — BP 165/95 | Ht 68.0 in | Wt 170.0 lb

## 2018-07-13 DIAGNOSIS — W108XXA Fall (on) (from) other stairs and steps, initial encounter: Secondary | ICD-10-CM | POA: Insufficient documentation

## 2018-07-13 DIAGNOSIS — S20211A Contusion of right front wall of thorax, initial encounter: Secondary | ICD-10-CM | POA: Insufficient documentation

## 2018-07-13 NOTE — Telephone Encounter (Signed)
Pt was notified. He states he was just looking to get some tylenol or pain reliever for his rib pain. I advised him to call neuro to find out what he can take since they prescribe him oxycodone

## 2018-07-13 NOTE — Telephone Encounter (Signed)
Please call Logan Phillips back.  I spoke to him with the virtual visit a few minutes ago  I called neurosurgery office this morning just to make sure we are on the same page since he is asking for something to help with pain.  He apparently had a tele-visit with them yesterday and his doctor over there sent out oxycodone 5 mg #40 this morning.  So he should have a prescription at the pharmacy as of this morning.  They have also referred him to pain management with Dr. Maryjean Ka in the same office and he should be getting a call from them soon  Thus, I am not sending any pain medicine today as it was just sent out by neurosurgery.  In the future he should just stick with neurosurgery to prescribe pain medicine or to help him deal with his pain as we do not want to have 2 different providers providing narcotics or other pain control

## 2018-07-13 NOTE — Progress Notes (Signed)
Subjective:     Patient ID: Logan Phillips, male   DOB: 1950-12-13, 68 y.o.   MRN: [id number]  This visit type was conducted due to national recommendations for restrictions regarding the COVID-19 Pandemic (e.g. social distancing) in an effort to limit this patient's exposure and mitigate transmission in our community.  Due to their co-morbid illnesses, this patient is at least at moderate risk for complications without adequate follow up.  This format is felt to be most appropriate for this patient at this time.    Documentation for virtual audio and video telecommunications through Zoom encounter:  The patient was located at home. The provider was located in the office. The patient did consent to this visit and is aware of possible charges through their insurance for this visit.  The other persons participating in this telemedicine service were none. Time spent on call was 15 minutes and in review of previous records >20 minutes total.  This virtual service is not related to other E/M service within previous 7 days.   HPI Chief Complaint  Patient presents with  . Fall    fell on last Wed 07/05/2018 and hurt right side (rib area). Still having pain, hard to take deep breaths.    Virtual visit.  He had brain surgery back in 04/2018.   Feels 70% back to normal in that regard.   Initially did some therapy but doing home exercises currently.    Been spending some time at his [place] in [place] getting some rest.   But just came back yesterday to [place].  No cough, no fever, no SOB, no covid19 contacts.    In general still staying with daughter.   He notes no alcohol use since January 2020.   His main concern today is a fall.  He fell 07/04/2018.  He was walking down steps at his house and it had been raining.  Slipped on wet steps and fell onto ground against right side.  Hit right shoulder, ribs, and hip.   He did hit his head, but didn't not have LOC, no slurred speech, no  confusion.   He got up within seconds.   He reports he called his neurosurgery and says they felt he was ok.  His main injury was hitting his ribs which is still hurting.  Thinks he bruised some ribs.   Hurts to take a deep breath, hurts to move his chest.  No unusual chest motions, no bruising, no redness.  No wheezing, no SOB.  No cough, no congestion.   No coughing up blood.   He is requesting something to help with the pain.  No other aggravating or relieving factors. No other complaint.  Past Medical History:  Diagnosis Date  . Chronic pain in shoulder 2018   [place]  . H/O echocardiogram 10/08/2016   EF 65-70%, normal wall motion, systolic function vigorous  . Hearing loss    pending hearing aids 06/2014  . Hypertension   . Lipoma   . Varicose vein   . Vestibular schwannoma ([place]) 04/2018   Past Surgical History:  Procedure Laterality Date  . APPENDECTOMY    . APPLICATION OF CRANIAL NAVIGATION Right 05/09/2018   Procedure: APPLICATION OF CRANIAL NAVIGATION;  Surgeon: Consuella Lose, MD;  Location: [facility];  Service: Neurosurgery;  Laterality: Right;  . BIOPSY  04/12/2018   Procedure: BIOPSY;  Surgeon: Juanita Craver, MD;  Location: [facility] ENDOSCOPY;  Service: Endoscopy;;  . COLONOSCOPY  age 30, he did not f/u with referral 2018  . COLONOSCOPY WITH PROPOFOL N/A 04/12/2018   Procedure: COLONOSCOPY WITH PROPOFOL;  Surgeon: Juanita Craver, MD;  Location: [facility] ENDOSCOPY;  Service: Endoscopy;  Laterality: N/A;  . CRANIOTOMY Right 05/09/2018   Procedure: Right retrosigmoid craniectomy, resection of acoustic neuroma with intraoperative facial monitoring/Brain Lab;  Surgeon: Consuella Lose, MD;  Location: [facility];  Service: Neurosurgery;  Laterality: Right;  . LUMBAR DISC SURGERY  2002   Dr. Rita Ohara  . WISDOM TOOTH EXTRACTION       Review of Systems As in subjective    Objective:   Physical Exam Due to coronavirus pandemic stay at home measures, patient visit was virtual  and they were not examined in person.   Gen: nad No obvious abnormality, no audible wheezing, talking in complete sentences without labor.     Assessment:     Encounter Diagnoses  Name Primary?  . Rib contusion, right, initial encounter Yes  . Fall (on) (from) other stairs and steps, initial encounter        Plan:     We discussed the limitations of virtual visit.  We discussed his recent injury.  It sounds like he has had a rib contusion, but based on his telephone call he is breathing fine, talking in complete sentences without effort.  We discussed importance of incentive spirometry, to take deep breaths throughout the day.  We discussed relative rest.  We discussed that rib contusions can take weeks to heal but the pain should gradually get better each week.  Avoid reinjury.  Glad to hear he is doing better since his surgery to remove brain tumor back in February.  Advise early next week or 2, if he has any fever, shortness of breath, congestion, or not feeling right to call back right away.  I told him I would look into what I could use for medication help with pain and will get back with him.  He requested meloxicam or pain medicine.  He does have a history of alcohol addiction and has been known to overuse narcotics in the past.  After the virtual visit with me, I called over to [facility] and he apparently had a tele-visit just yesterday, and neurosurgery refilled oxycodone 5 mg #40 as of this morning.    We will call him back and advised that I cannot give him pain medicine since he just got some from neurosurgery yesterday that was called out this morning.  Also reminded him not to have more than one provider treating him for pain.  He is pending a pain management eval through [facility] with Dr. Maryjean Ka.   Tabb was seen today for fall.  Diagnoses and all orders for this visit:  Rib contusion, right, initial encounter  Fall (on) (from) other stairs and  steps, initial encounter

## 2018-07-17 ENCOUNTER — Other Ambulatory Visit: Payer: Self-pay

## 2018-07-17 ENCOUNTER — Encounter: Payer: Self-pay | Admitting: Medical

## 2018-07-17 ENCOUNTER — Ambulatory Visit
Admission: RE | Admit: 2018-07-17 | Discharge: 2018-07-17 | Disposition: A | Discharge status: Home / Self Care | Payer: Medicare HMO | Source: Ambulatory Visit | Attending: Medical | Admitting: Medical

## 2018-07-17 ENCOUNTER — Ambulatory Visit (INDEPENDENT_AMBULATORY_CARE_PROVIDER_SITE_OTHER): Payer: Medicare HMO | Admitting: Medical

## 2018-07-17 VITALS — BP 140/86 | HR 91 | Temp 96.7°F | Resp 16 | Ht 68.0 in | Wt 160.0 lb

## 2018-07-17 DIAGNOSIS — W108XXD Fall (on) (from) other stairs and steps, subsequent encounter: Secondary | ICD-10-CM

## 2018-07-17 DIAGNOSIS — M25521 Pain in right elbow: Secondary | ICD-10-CM

## 2018-07-17 DIAGNOSIS — S20211D Contusion of right front wall of thorax, subsequent encounter: Secondary | ICD-10-CM | POA: Diagnosis not present

## 2018-07-17 NOTE — Patient Instructions (Signed)
Please go to [facility] for your right elbow xray.   Their hours are 8am - 4:30 pm Monday - Friday.  Take your insurance card with you.  [facility] [phone]  [address] [place], [place] [address]

## 2018-07-17 NOTE — Progress Notes (Signed)
Subjective: Chief Complaint  Patient presents with  . right arm    right arm and elbow swollen and pain X Saturday   Here for right arm pain.   We did a virtual visit on April 30 for rib contusion and rib pain after a fall.  See that visit for additional information.  He fell on July 04, 2018 when he was walking on the steps at his house.  He states he slipped on wet steps and fell onto his right side.  He denies head injury or loss of consciousness.  When I talked to him on April 30 he mainly reported rib pain the arm was not bothering him as much at the time.  However today he notes that his main concern today is right elbow pain.  He feels swollen around the elbow, certainly in pain, hurts to extend and flex his wrist, cannot twist his wrist due to pain in the elbow.  No bruising.  No numbness or tingling.  He called the after-hours line over the weekend was told to use ibuprofen 4 tablets every 6 hours of the over-the-counter dose which he is doing.  He is also using the oxycodone sent by his neurosurgeon last week when he called in without complaint.  Of note when I spoke to him last week he declined to tell me he had gotten pain medicine earlier that day from his neurosurgeon  He is using a right eye patch status post brain surgery a few months ago.  There could be some nerve damage to be nerves of the eye.  Using eye patch, using wet drops and trying to keep the eye covered particular during sleep  No other aggravating or relieving factors. No other complaint.  Objective: BP 140/86   Pulse 91   Temp (!) 96.7 F (35.9 C) (Oral)   Resp 16   Ht 5\' 8"  (1.727 m)   Wt 160 lb (72.6 kg)   SpO2 98%   BMI 24.33 kg/m   Wt Readings from Last 3 Encounters:  07/17/18 160 lb (72.6 kg)  07/13/18 170 lb (77.1 kg)  05/20/18 160 lb (72.6 kg)   Gen: wd, wn, nad Skin: unremarkable, no obvious bruising of arms or ribs Ribs: Mild tenderness on the right lateral ribs no flail chest Lungs  clear There is mild generalized swelling of the right proximal posterior forearm, tender right over the radial head, quite tender in this area, decreased range of motion with right wrist due to pain in the elbow region, he cannot pronate or supinate the wrist also due to pain in the elbow region, otherwise arm without deformity and nontender otherwise Arms neurovascularly intact He is seated with a eye patch over the right eye    Assessment Encounter Diagnoses  Name Primary?  . Right elbow pain Yes  . Fall (on) (from) other stairs and steps, subsequent encounter   . Rib contusion, right, subsequent encounter      Plan: We had a virtual visit on April 30 specifically for rib pain.  This is a new encounter for new complaint today of right elbow pain.  I am concerned there could possibly be a fracture in the right radial head area.  We will send him for x-ray of his elbow.  His friend brought him today.  If x-ray is negative for fracture, advised that he would need to do arm sling throughout the day except for breaks here and there every couple hours, ice water pack 20 minutes  on 20 minutes off, continue his pain medicine through his neurosurgeon but can alternate this by 4 hours with over-the-counter ibuprofen 3 tablets at a time  We discussed the need for possibly repeat x-ray in 7 to 10 days if not much improved provided no fracture on x-ray today  His ribs seem to be improving in terms of rib contusion  He voices understanding and agreement of recommendations today  Yosiel was seen today for right arm.  Diagnoses and all orders for this visit:  Right elbow pain -     DG Elbow Complete Right; Future  Fall (on) (from) other stairs and steps, subsequent encounter -     DG Elbow Complete Right; Future  Rib contusion, right, subsequent encounter -     DG Elbow Complete Right; Future

## 2018-07-18 DIAGNOSIS — M25521 Pain in right elbow: Secondary | ICD-10-CM | POA: Diagnosis not present

## 2018-08-03 DIAGNOSIS — S46311A Strain of muscle, fascia and tendon of triceps, right arm, initial encounter: Secondary | ICD-10-CM | POA: Diagnosis not present

## 2018-08-03 DIAGNOSIS — S56511A Strain of other extensor muscle, fascia and tendon at forearm level, right arm, initial encounter: Secondary | ICD-10-CM | POA: Diagnosis not present

## 2018-08-03 DIAGNOSIS — M19021 Primary osteoarthritis, right elbow: Secondary | ICD-10-CM | POA: Diagnosis not present

## 2018-08-03 DIAGNOSIS — S5321XA Traumatic rupture of right radial collateral ligament, initial encounter: Secondary | ICD-10-CM | POA: Diagnosis not present

## 2018-08-03 DIAGNOSIS — M778 Other enthesopathies, not elsewhere classified: Secondary | ICD-10-CM | POA: Diagnosis not present

## 2018-08-08 DIAGNOSIS — M25521 Pain in right elbow: Secondary | ICD-10-CM | POA: Diagnosis not present

## 2018-08-09 DIAGNOSIS — D333 Benign neoplasm of cranial nerves: Secondary | ICD-10-CM | POA: Diagnosis not present

## 2018-08-23 DIAGNOSIS — M25511 Pain in right shoulder: Secondary | ICD-10-CM | POA: Diagnosis not present

## 2018-08-24 DIAGNOSIS — Z0000 Encounter for general adult medical examination without abnormal findings: Secondary | ICD-10-CM | POA: Diagnosis not present

## 2018-08-24 DIAGNOSIS — M129 Arthropathy, unspecified: Secondary | ICD-10-CM | POA: Diagnosis not present

## 2018-08-24 DIAGNOSIS — G8929 Other chronic pain: Secondary | ICD-10-CM | POA: Diagnosis not present

## 2018-08-24 DIAGNOSIS — M25531 Pain in right wrist: Secondary | ICD-10-CM | POA: Diagnosis not present

## 2018-08-24 DIAGNOSIS — Z131 Encounter for screening for diabetes mellitus: Secondary | ICD-10-CM | POA: Diagnosis not present

## 2018-08-24 DIAGNOSIS — E7800 Pure hypercholesterolemia, unspecified: Secondary | ICD-10-CM | POA: Diagnosis not present

## 2018-08-24 DIAGNOSIS — R5383 Other fatigue: Secondary | ICD-10-CM | POA: Diagnosis not present

## 2018-08-24 DIAGNOSIS — M545 Low back pain: Secondary | ICD-10-CM | POA: Diagnosis not present

## 2018-08-24 DIAGNOSIS — Z79899 Other long term (current) drug therapy: Secondary | ICD-10-CM | POA: Diagnosis not present

## 2018-08-24 DIAGNOSIS — M542 Cervicalgia: Secondary | ICD-10-CM | POA: Diagnosis not present

## 2018-08-24 DIAGNOSIS — E559 Vitamin D deficiency, unspecified: Secondary | ICD-10-CM | POA: Diagnosis not present

## 2018-09-01 DIAGNOSIS — G4700 Insomnia, unspecified: Secondary | ICD-10-CM | POA: Diagnosis not present

## 2018-09-01 DIAGNOSIS — G540 Brachial plexus disorders: Secondary | ICD-10-CM | POA: Diagnosis not present

## 2018-09-01 DIAGNOSIS — M9901 Segmental and somatic dysfunction of cervical region: Secondary | ICD-10-CM | POA: Diagnosis not present

## 2018-09-01 DIAGNOSIS — M9902 Segmental and somatic dysfunction of thoracic region: Secondary | ICD-10-CM | POA: Diagnosis not present

## 2018-09-04 DIAGNOSIS — G4700 Insomnia, unspecified: Secondary | ICD-10-CM | POA: Diagnosis not present

## 2018-09-04 DIAGNOSIS — G540 Brachial plexus disorders: Secondary | ICD-10-CM | POA: Diagnosis not present

## 2018-09-04 DIAGNOSIS — M9902 Segmental and somatic dysfunction of thoracic region: Secondary | ICD-10-CM | POA: Diagnosis not present

## 2018-09-04 DIAGNOSIS — M9901 Segmental and somatic dysfunction of cervical region: Secondary | ICD-10-CM | POA: Diagnosis not present

## 2018-09-07 DIAGNOSIS — M25511 Pain in right shoulder: Secondary | ICD-10-CM | POA: Diagnosis not present

## 2018-09-07 DIAGNOSIS — M542 Cervicalgia: Secondary | ICD-10-CM | POA: Diagnosis not present

## 2018-09-07 DIAGNOSIS — I10 Essential (primary) hypertension: Secondary | ICD-10-CM | POA: Diagnosis not present

## 2018-09-07 DIAGNOSIS — G8929 Other chronic pain: Secondary | ICD-10-CM | POA: Diagnosis not present

## 2018-09-07 DIAGNOSIS — M545 Low back pain: Secondary | ICD-10-CM | POA: Diagnosis not present

## 2018-09-08 ENCOUNTER — Other Ambulatory Visit: Payer: Self-pay

## 2018-09-08 ENCOUNTER — Other Ambulatory Visit (HOSPITAL_COMMUNITY): Payer: Self-pay

## 2018-09-08 ENCOUNTER — Encounter (HOSPITAL_COMMUNITY): Payer: Self-pay | Admitting: Emergency Medicine

## 2018-09-08 ENCOUNTER — Observation Stay (HOSPITAL_COMMUNITY)
Admission: EM | Admit: 2018-09-08 | Discharge: 2018-09-10 | Disposition: A | Discharge status: Home / Self Care | Payer: Medicare HMO | Attending: Internal Medicine | Admitting: Internal Medicine

## 2018-09-08 ENCOUNTER — Emergency Department (HOSPITAL_COMMUNITY): Payer: Medicare HMO

## 2018-09-08 DIAGNOSIS — N183 Chronic kidney disease, stage 3 unspecified: Secondary | ICD-10-CM | POA: Diagnosis present

## 2018-09-08 DIAGNOSIS — R296 Repeated falls: Secondary | ICD-10-CM | POA: Insufficient documentation

## 2018-09-08 DIAGNOSIS — F1010 Alcohol abuse, uncomplicated: Secondary | ICD-10-CM | POA: Insufficient documentation

## 2018-09-08 DIAGNOSIS — K219 Gastro-esophageal reflux disease without esophagitis: Secondary | ICD-10-CM | POA: Diagnosis present

## 2018-09-08 DIAGNOSIS — N179 Acute kidney failure, unspecified: Secondary | ICD-10-CM | POA: Diagnosis present

## 2018-09-08 DIAGNOSIS — M25521 Pain in right elbow: Secondary | ICD-10-CM | POA: Diagnosis not present

## 2018-09-08 DIAGNOSIS — E785 Hyperlipidemia, unspecified: Secondary | ICD-10-CM | POA: Diagnosis not present

## 2018-09-08 DIAGNOSIS — Z03818 Encounter for observation for suspected exposure to other biological agents ruled out: Secondary | ICD-10-CM | POA: Insufficient documentation

## 2018-09-08 DIAGNOSIS — Z8249 Family history of ischemic heart disease and other diseases of the circulatory system: Secondary | ICD-10-CM | POA: Insufficient documentation

## 2018-09-08 DIAGNOSIS — D631 Anemia in chronic kidney disease: Secondary | ICD-10-CM | POA: Insufficient documentation

## 2018-09-08 DIAGNOSIS — C7240 Malignant neoplasm of unspecified acoustic nerve: Secondary | ICD-10-CM | POA: Diagnosis not present

## 2018-09-08 DIAGNOSIS — R410 Disorientation, unspecified: Secondary | ICD-10-CM | POA: Diagnosis not present

## 2018-09-08 DIAGNOSIS — R4182 Altered mental status, unspecified: Secondary | ICD-10-CM | POA: Diagnosis present

## 2018-09-08 DIAGNOSIS — I10 Essential (primary) hypertension: Secondary | ICD-10-CM | POA: Diagnosis present

## 2018-09-08 DIAGNOSIS — Z79899 Other long term (current) drug therapy: Secondary | ICD-10-CM | POA: Insufficient documentation

## 2018-09-08 DIAGNOSIS — R52 Pain, unspecified: Secondary | ICD-10-CM

## 2018-09-08 DIAGNOSIS — R000 Tachycardia, unspecified: Secondary | ICD-10-CM | POA: Diagnosis not present

## 2018-09-08 DIAGNOSIS — F1911 Other psychoactive substance abuse, in remission: Secondary | ICD-10-CM

## 2018-09-08 DIAGNOSIS — S42401P Unspecified fracture of lower end of right humerus, subsequent encounter for fracture with malunion: Secondary | ICD-10-CM | POA: Diagnosis not present

## 2018-09-08 DIAGNOSIS — I129 Hypertensive chronic kidney disease with stage 1 through stage 4 chronic kidney disease, or unspecified chronic kidney disease: Secondary | ICD-10-CM | POA: Insufficient documentation

## 2018-09-08 DIAGNOSIS — R69 Illness, unspecified: Secondary | ICD-10-CM | POA: Diagnosis not present

## 2018-09-08 DIAGNOSIS — S42414A Nondisplaced simple supracondylar fracture without intercondylar fracture of right humerus, initial encounter for closed fracture: Secondary | ICD-10-CM | POA: Diagnosis not present

## 2018-09-08 DIAGNOSIS — Z789 Other specified health status: Secondary | ICD-10-CM

## 2018-09-08 DIAGNOSIS — D3610 Benign neoplasm of peripheral nerves and autonomic nervous system, unspecified: Secondary | ICD-10-CM | POA: Diagnosis present

## 2018-09-08 DIAGNOSIS — M25511 Pain in right shoulder: Secondary | ICD-10-CM | POA: Diagnosis not present

## 2018-09-08 DIAGNOSIS — G9341 Metabolic encephalopathy: Principal | ICD-10-CM | POA: Diagnosis present

## 2018-09-08 LAB — I-STAT CHEM 8, ED
BUN: 32 mg/dL — ABNORMAL HIGH (ref 8–23)
Calcium, Ion: 1.02 mmol/L — ABNORMAL LOW (ref 1.15–1.40)
Chloride: 101 mmol/L (ref 98–111)
Creatinine, Ser: 1.7 mg/dL — ABNORMAL HIGH (ref 0.61–1.24)
Glucose, Bld: 88 mg/dL (ref 70–99)
HCT: 37 % — ABNORMAL LOW (ref 39.0–52.0)
Hemoglobin: 12.6 g/dL — ABNORMAL LOW (ref 13.0–17.0)
Potassium: 4.8 mmol/L (ref 3.5–5.1)
Sodium: 133 mmol/L — ABNORMAL LOW (ref 135–145)
TCO2: 26 mmol/L (ref 22–32)

## 2018-09-08 LAB — CBC
HCT: 36.9 % — ABNORMAL LOW (ref 39.0–52.0)
Hemoglobin: 12.2 g/dL — ABNORMAL LOW (ref 13.0–17.0)
MCH: 28.2 pg (ref 26.0–34.0)
MCHC: 33.1 g/dL (ref 30.0–36.0)
MCV: 85.2 fL (ref 80.0–100.0)
Platelets: 212 10*3/uL (ref 150–400)
RBC: 4.33 MIL/uL (ref 4.22–5.81)
RDW: 13 % (ref 11.5–15.5)
WBC: 7.6 10*3/uL (ref 4.0–10.5)
nRBC: 0 % (ref 0.0–0.2)

## 2018-09-08 LAB — COMPREHENSIVE METABOLIC PANEL
ALT: 9 U/L (ref 0–44)
AST: 15 U/L (ref 15–41)
Albumin: 4.4 g/dL (ref 3.5–5.0)
Alkaline Phosphatase: 51 U/L (ref 38–126)
Anion gap: 12 (ref 5–15)
BUN: 30 mg/dL — ABNORMAL HIGH (ref 8–23)
CO2: 23 mmol/L (ref 22–32)
Calcium: 9.7 mg/dL (ref 8.9–10.3)
Chloride: 99 mmol/L (ref 98–111)
Creatinine, Ser: 1.72 mg/dL — ABNORMAL HIGH (ref 0.61–1.24)
GFR calc Af Amer: 47 mL/min — ABNORMAL LOW (ref >60)
GFR calc non Af Amer: 40 mL/min — ABNORMAL LOW (ref >60)
Glucose, Bld: 90 mg/dL (ref 70–99)
Potassium: 4.8 mmol/L (ref 3.5–5.1)
Sodium: 134 mmol/L — ABNORMAL LOW (ref 135–145)
Total Bilirubin: 0.9 mg/dL (ref 0.3–1.2)
Total Protein: 8.4 g/dL — ABNORMAL HIGH (ref 6.5–8.1)

## 2018-09-08 LAB — DIFFERENTIAL
Abs Immature Granulocytes: 0.04 10*3/uL (ref 0.00–0.07)
Basophils Absolute: 0 10*3/uL (ref 0.0–0.1)
Basophils Relative: 0 %
Eosinophils Absolute: 0.1 10*3/uL (ref 0.0–0.5)
Eosinophils Relative: 1 %
Immature Granulocytes: 1 %
Lymphocytes Relative: 18 %
Lymphs Abs: 1.4 10*3/uL (ref 0.7–4.0)
Monocytes Absolute: 0.8 10*3/uL (ref 0.1–1.0)
Monocytes Relative: 10 %
Neutro Abs: 5.3 10*3/uL (ref 1.7–7.7)
Neutrophils Relative %: 70 %

## 2018-09-08 LAB — PROTIME-INR
INR: 1.1 (ref 0.8–1.2)
Prothrombin Time: 14.5 seconds (ref 11.4–15.2)

## 2018-09-08 LAB — APTT: aPTT: 42 seconds — ABNORMAL HIGH (ref 24–36)

## 2018-09-08 LAB — CBG MONITORING, ED: Glucose-Capillary: 78 mg/dL (ref 70–99)

## 2018-09-08 MED ORDER — SODIUM CHLORIDE 0.9% FLUSH
3.0000 mL | Freq: Once | INTRAVENOUS | Status: DC
Start: 2018-09-08 — End: 2018-09-10

## 2018-09-08 MED ORDER — LORAZEPAM 2 MG/ML IJ SOLN
1.0000 mg | Freq: Once | INTRAMUSCULAR | Status: AC
  Administered 2018-09-09: 1 mg via INTRAVENOUS
  Filled 2018-09-08: qty 1

## 2018-09-08 MED ORDER — THIAMINE HCL 100 MG/ML IJ SOLN
100.0000 mg | Freq: Once | INTRAMUSCULAR | Status: AC
  Administered 2018-09-09: 100 mg via INTRAVENOUS
  Filled 2018-09-08: qty 2

## 2018-09-08 MED ORDER — SODIUM CHLORIDE 0.9 % IV BOLUS (SEPSIS)
1000.0000 mL | Freq: Once | INTRAVENOUS | Status: AC
  Administered 2018-09-09: 1000 mL via INTRAVENOUS

## 2018-09-08 NOTE — ED Triage Notes (Signed)
Patient ex-spouse reported pt. Has been confused/disoriented for the past several days ( wandering at neighborhood / unable to recall incident ) , alert and oriented x4 at triage , denies pain/respirations unlabored.

## 2018-09-08 NOTE — ED Provider Notes (Addendum)
TIME SEEN: 11:48 PM  CHIEF COMPLAINT: Altered mental status  HPI: Patient is a 68 year old male with history of hypertension, acoustic neuroma status post resection by Dr. Kathyrn Sheriff, history of alcohol abuse who presents to the emergency department with altered mental status.  Patient is brought in by his ex-wife.  She states that she is unsure when he was last seen normal.  She states that her oldest daughter noticed that he seemed confused several days ago when she took him to the doctor and he asked her to sign as his guardian.  His ex-wife states that the patient's roommate states that last night he was roaming around outside confused and they could not get him back in the house.  She states today she talked him on the phone and he seemed to be confused.  He went to the doctor because he was having right arm pain and was found to have a fracture and was splinted at [facility] office and was instructed to come to the ED.  Ex-wife reports in the waiting room the patient was looking at the palm of his hand telling her that he was watching a movie.  She states that he has not been drinking any alcohol since February.  She states that he was recently prescribed Flexeril and she thinks this could be causing his symptoms.  No known head injury.  It is unclear how he injured his right arm.  She states that they think it may be from a fall that he had in April.  It appears that patient received 56 tablets of 5 mg of oxycodone on June 11.   ALYSON  [phone] EX WIFE  ROS: Level 5 caveat secondary to altered mental status  PAST MEDICAL HISTORY/PAST SURGICAL HISTORY:  Past Medical History:  Diagnosis Date  . Chronic pain in shoulder 2018   [place]  . H/O echocardiogram 10/08/2016   EF 65-70%, normal wall motion, systolic function vigorous  . Hearing loss    pending hearing aids 06/2014  . Hypertension   . Lipoma   . Varicose vein   . Vestibular schwannoma ([place]) 04/2018     MEDICATIONS:  Prior to Admission medications   Medication Sig Start Date End Date Taking? Authorizing Provider  acetaminophen (TYLENOL 8 HOUR) 650 MG CR tablet Take 1 tablet (650 mg total) by mouth every 8 (eight) hours as needed for pain. 04/18/18   Tysinger, Camelia Eng, PA-C  lisinopril (PRINIVIL,ZESTRIL) 20 MG tablet Take 1 tablet (20 mg total) by mouth daily. 10/06/17   Tysinger, Camelia Eng, PA-C  magnesium oxide (MAG-OX) 400 MG tablet Take 400 mg by mouth 2 (two) times daily.    [provider]  metoprolol tartrate (LOPRESSOR) 100 MG tablet Take 1 tablet (100 mg total) by mouth 2 (two) times daily. 10/06/17   Tysinger, Camelia Eng, PA-C  Multiple Vitamin (MULITIVITAMIN WITH MINERALS) TABS Take 1 tablet by mouth daily.    [provider]  omeprazole (PRILOSEC) 40 MG capsule Take 1 capsule (40 mg total) by mouth daily. 02/22/18   Tysinger, Camelia Eng, PA-C  oxyCODONE (ROXICODONE) 5 MG immediate release tablet Take 1 tablet (5 mg total) by mouth every 4 (four) hours as needed for severe pain. 05/17/18   Costella, Vista Mink, PA-C  pravastatin (PRAVACHOL) 20 MG tablet Take 1 tablet (20 mg total) by mouth every evening. Patient not taking: Reported on 04/18/2018 10/06/17 10/06/18  Carlena Hurl, PA-C    ALLERGIES:  No Known Allergies  SOCIAL HISTORY:  Social History   Tobacco Use  . Smoking status: Never Smoker  . Smokeless tobacco: Never Used  Substance Use Topics  . Alcohol use: Not Currently    Alcohol/week: 32.0 standard drinks    Types: 30 Cans of beer, 2 Shots of liquor per week    Comment: stopped 3 weeks ago 04/12/2018    FAMILY HISTORY: Family History  Problem Relation Age of Onset  . Hypertension Mother   . Dementia Father   . Parkinsonism Father   . Diabetes Father        early stages  . Cancer Maternal Grandmother        breast  . Heart disease Neg Hx   . Stroke Neg Hx     EXAM: BP (!) 146/99   Pulse 92   Temp 98.7 F (37.1 C) (Oral)   Resp 18   SpO2  95%  CONSTITUTIONAL: Alert and oriented to person only.  Does not answer questions appropriately.  Has tangential thought process.  Seems slightly agitated. HEAD: Normocephalic EYES: Conjunctivae clear, pupils appear equal, EOMI, his pupils are not pinpoint ENT: normal nose; moist mucous membranes NECK: Supple, no meningismus, no nuchal rigidity, no LAD  CARD: Regular and tachycardic; S1 and S2 appreciated; no murmurs, no clicks, no rubs, no gallops RESP: Normal chest excursion without splinting or tachypnea; breath sounds clear and equal bilaterally; no wheezes, no rhonchi, no rales, no hypoxia or respiratory distress, speaking full sentences ABD/GI: Normal bowel sounds; non-distended; soft, non-tender, no rebound, no guarding, no peritoneal signs, no hepatosplenomegaly BACK:  The back appears normal and is non-tender to palpation, there is no CVA tenderness EXT: Normal ROM in all joints; non-tender to palpation; no edema; normal capillary refill; no cyanosis, no calf tenderness or swelling, patient's right arm is in a splint    SKIN: Normal color for age and race; warm; no rash NEURO: Moves all extremities equally, no pronator drift, patient has right-sided facial droop which is chronic, otherwise cranial nerves II through XII intact, normal speech, patient is hard of hearing, no asterixis but he is very tremulous on exam PSYCH: Patient is confused.  Tangential thought process.  MEDICAL DECISION MAKING: Patient here with altered mental status.  Labs obtained in triage show chronic kidney disease which is unchanged.  Head CT shows no acute abnormality.  Placed in a splint today by the orthopedic office for fracture of the right arm.  Family is not sure what bone has been fractured.  They state he was seen by Dr. Griffin Basil.  It is unclear how he injured his arm.  He has no other obvious sign of trauma on exam.  He seems very confused and appears to be actively withdrawing from something today.  I think  that he is withdrawing from alcohol.  Differential also includes Wernicke's encephalopathy, hepatic encephalopathy, overdose.  No obvious infectious etiology.  He is afebrile here.  Will add on ammonia level and alcohol level as well as a urinalysis and urine drug screen.  Will give IV fluids, Ativan.  ED PROGRESS: Alcohol level is 0.  Ammonia level is 16.  Patient is more calm and heart rate has improved after Ativan.  Will discuss with medicine for admission.  1:43 AM Discussed patient's case with hospitalist, Dr. Blaine Hamper.  I have recommended admission and patient (and family if present) agree with this plan. Admitting physician will place admission orders.   I reviewed all nursing notes, vitals, pertinent previous records, EKGs, lab and  urine results, imaging (as available).     EKG Interpretation  Date/Time:  Friday September 08 2018 22:36:02 EDT Ventricular Rate:  94 PR Interval:    QRS Duration: 86 QT Interval:  271 QTC Calculation: 339 R Axis:   80 Text Interpretation:  Sinus rhythm Abnormal R-wave progression, early transition Nonspecific repol abnormality, diffuse leads ST-t wave abnormality Abnormal ekg Confirmed by Carmin Muskrat [phone]) on 09/08/2018 10:39:49 PM         EKG Interpretation  Date/Time:  Saturday September 09 2018 01:49:43 EDT Ventricular Rate:  130 PR Interval:    QRS Duration: 82 QT Interval:  403 QTC Calculation: 593 R Axis:   84 Text Interpretation:  Sinus tachycardia Borderline right axis deviation Repol abnrm suggests ischemia, diffuse leads Prolonged QT interval Rate faster than previous Confirmed by Pryor Curia [phone]) on 09/09/2018 1:52:59 AM        CRITICAL CARE Performed by: Cyril Mourning Ward   Total critical care time: 45 minutes  Critical care time was exclusive of separately billable procedures and treating other patients.  Critical care was necessary to treat or prevent imminent or life-threatening deterioration.  Critical care was time spent  personally by me on the following activities: development of treatment plan with patient and/or surrogate as well as nursing, discussions with consultants, evaluation of patient's response to treatment, examination of patient, obtaining history from patient or surrogate, ordering and performing treatments and interventions, ordering and review of laboratory studies, ordering and review of radiographic studies, pulse oximetry and re-evaluation of patient's condition.       Ward, Delice Bison, DO 09/09/18 [phone]

## 2018-09-09 ENCOUNTER — Encounter (HOSPITAL_COMMUNITY): Payer: Self-pay

## 2018-09-09 DIAGNOSIS — E785 Hyperlipidemia, unspecified: Secondary | ICD-10-CM | POA: Diagnosis not present

## 2018-09-09 DIAGNOSIS — N183 Chronic kidney disease, stage 3 unspecified: Secondary | ICD-10-CM | POA: Diagnosis present

## 2018-09-09 DIAGNOSIS — N179 Acute kidney failure, unspecified: Secondary | ICD-10-CM | POA: Diagnosis not present

## 2018-09-09 DIAGNOSIS — Z789 Other specified health status: Secondary | ICD-10-CM | POA: Diagnosis not present

## 2018-09-09 DIAGNOSIS — I10 Essential (primary) hypertension: Secondary | ICD-10-CM | POA: Diagnosis not present

## 2018-09-09 DIAGNOSIS — K219 Gastro-esophageal reflux disease without esophagitis: Secondary | ICD-10-CM | POA: Diagnosis not present

## 2018-09-09 DIAGNOSIS — G9341 Metabolic encephalopathy: Secondary | ICD-10-CM | POA: Diagnosis not present

## 2018-09-09 LAB — TSH: TSH: 1.13 u[IU]/mL (ref 0.350–4.500)

## 2018-09-09 LAB — CBC
HCT: 33.2 % — ABNORMAL LOW (ref 39.0–52.0)
Hemoglobin: 11 g/dL — ABNORMAL LOW (ref 13.0–17.0)
MCH: 27.4 pg (ref 26.0–34.0)
MCHC: 33.1 g/dL (ref 30.0–36.0)
MCV: 82.6 fL (ref 80.0–100.0)
Platelets: 182 10*3/uL (ref 150–400)
RBC: 4.02 MIL/uL — ABNORMAL LOW (ref 4.22–5.81)
RDW: 12.6 % (ref 11.5–15.5)
WBC: 5.6 10*3/uL (ref 4.0–10.5)
nRBC: 0 % (ref 0.0–0.2)

## 2018-09-09 LAB — RAPID URINE DRUG SCREEN, HOSP PERFORMED
Amphetamines: NOT DETECTED
Barbiturates: NOT DETECTED
Benzodiazepines: NOT DETECTED
Cocaine: NOT DETECTED
Opiates: POSITIVE — AB
Tetrahydrocannabinol: NOT DETECTED

## 2018-09-09 LAB — BASIC METABOLIC PANEL
Anion gap: 12 (ref 5–15)
BUN: 25 mg/dL — ABNORMAL HIGH (ref 8–23)
CO2: 23 mmol/L (ref 22–32)
Calcium: 9.1 mg/dL (ref 8.9–10.3)
Chloride: 101 mmol/L (ref 98–111)
Creatinine, Ser: 1.23 mg/dL (ref 0.61–1.24)
GFR calc Af Amer: >60 mL/min (ref >60)
GFR calc non Af Amer: >60 mL/min (ref >60)
Glucose, Bld: 88 mg/dL (ref 70–99)
Potassium: 4.5 mmol/L (ref 3.5–5.1)
Sodium: 136 mmol/L (ref 135–145)

## 2018-09-09 LAB — ETHANOL: Alcohol, Ethyl (B): <10 mg/dL (ref <10)

## 2018-09-09 LAB — VITAMIN B12: Vitamin B-12: 264 pg/mL (ref 180–914)

## 2018-09-09 LAB — AMMONIA: Ammonia: 16 umol/L (ref 9–35)

## 2018-09-09 LAB — SARS CORONAVIRUS 2 BY RT PCR (HOSPITAL ORDER, PERFORMED IN ~~LOC~~ HOSPITAL LAB): SARS Coronavirus 2: NEGATIVE

## 2018-09-09 MED ORDER — HYDROCODONE-ACETAMINOPHEN 5-325 MG PO TABS
1.0000 | ORAL_TABLET | Freq: Four times a day (QID) | ORAL | Status: AC | PRN
  Administered 2018-09-09: 1 via ORAL
  Administered 2018-09-10: 2 via ORAL
  Filled 2018-09-09: qty 2
  Filled 2018-09-09: qty 1

## 2018-09-09 MED ORDER — ADULT MULTIVITAMIN W/MINERALS CH
1.0000 | ORAL_TABLET | Freq: Every day | ORAL | Status: DC
  Administered 2018-09-09 – 2018-09-10 (×2): 1 via ORAL
  Filled 2018-09-09 (×2): qty 1

## 2018-09-09 MED ORDER — METOPROLOL TARTRATE 50 MG PO TABS
100.0000 mg | ORAL_TABLET | Freq: Two times a day (BID) | ORAL | Status: DC
  Administered 2018-09-09 – 2018-09-10 (×3): 100 mg via ORAL
  Filled 2018-09-09 (×3): qty 2

## 2018-09-09 MED ORDER — SODIUM CHLORIDE 0.9 % IV SOLN
INTRAVENOUS | Status: DC
  Administered 2018-09-09 – 2018-09-10 (×4): via INTRAVENOUS

## 2018-09-09 MED ORDER — ONDANSETRON HCL 4 MG PO TABS: 4.0000 mg | ORAL_TABLET | Freq: Four times a day (QID) | ORAL | Status: DC | PRN

## 2018-09-09 MED ORDER — SODIUM CHLORIDE 0.9 % IV SOLN: INTRAVENOUS | Status: DC

## 2018-09-09 MED ORDER — ENOXAPARIN SODIUM 40 MG/0.4ML ~~LOC~~ SOLN
40.0000 mg | SUBCUTANEOUS | Status: DC
  Administered 2018-09-09 – 2018-09-10 (×2): 40 mg via SUBCUTANEOUS
  Filled 2018-09-09 (×2): qty 0.4

## 2018-09-09 MED ORDER — LORAZEPAM 2 MG/ML IJ SOLN: 0.0000 mg | Freq: Two times a day (BID) | INTRAMUSCULAR | Status: DC

## 2018-09-09 MED ORDER — MAGNESIUM OXIDE 400 (241.3 MG) MG PO TABS
400.0000 mg | ORAL_TABLET | Freq: Two times a day (BID) | ORAL | Status: DC
  Administered 2018-09-10: 400 mg via ORAL
  Filled 2018-09-09: qty 1

## 2018-09-09 MED ORDER — PANTOPRAZOLE SODIUM 40 MG PO TBEC
40.0000 mg | DELAYED_RELEASE_TABLET | Freq: Every day | ORAL | Status: DC
  Administered 2018-09-09 – 2018-09-10 (×2): 40 mg via ORAL
  Filled 2018-09-09 (×2): qty 1

## 2018-09-09 MED ORDER — LORAZEPAM 2 MG/ML IJ SOLN
0.0000 mg | Freq: Four times a day (QID) | INTRAMUSCULAR | Status: DC
  Administered 2018-09-09: 2 mg via INTRAVENOUS

## 2018-09-09 MED ORDER — LORAZEPAM 2 MG/ML IJ SOLN
1.0000 mg | Freq: Four times a day (QID) | INTRAMUSCULAR | Status: DC | PRN
  Filled 2018-09-09: qty 1

## 2018-09-09 MED ORDER — THIAMINE HCL 100 MG/ML IJ SOLN: 100.0000 mg | Freq: Every day | INTRAMUSCULAR | Status: DC

## 2018-09-09 MED ORDER — HYDRALAZINE HCL 20 MG/ML IJ SOLN: 5.0000 mg | INTRAMUSCULAR | Status: DC | PRN

## 2018-09-09 MED ORDER — ADULT MULTIVITAMIN W/MINERALS CH: 1.0000 | ORAL_TABLET | Freq: Every day | ORAL | Status: DC

## 2018-09-09 MED ORDER — LORAZEPAM 2 MG/ML IJ SOLN
1.0000 mg | Freq: Once | INTRAMUSCULAR | Status: AC
  Administered 2018-09-09: 1 mg via INTRAVENOUS
  Filled 2018-09-09: qty 1

## 2018-09-09 MED ORDER — VITAMIN B-1 100 MG PO TABS
100.0000 mg | ORAL_TABLET | Freq: Every day | ORAL | Status: DC
  Administered 2018-09-09 – 2018-09-10 (×2): 100 mg via ORAL
  Filled 2018-09-09 (×2): qty 1

## 2018-09-09 MED ORDER — LORAZEPAM 1 MG PO TABS
1.0000 mg | ORAL_TABLET | Freq: Four times a day (QID) | ORAL | Status: DC | PRN
  Administered 2018-09-09: 1 mg via ORAL
  Filled 2018-09-09: qty 1

## 2018-09-09 MED ORDER — PRAVASTATIN SODIUM 10 MG PO TABS
20.0000 mg | ORAL_TABLET | Freq: Every evening | ORAL | Status: DC
  Administered 2018-09-09: 20 mg via ORAL
  Filled 2018-09-09 (×2): qty 2

## 2018-09-09 MED ORDER — FOLIC ACID 1 MG PO TABS
1.0000 mg | ORAL_TABLET | Freq: Every day | ORAL | Status: DC
  Administered 2018-09-09 – 2018-09-10 (×2): 1 mg via ORAL
  Filled 2018-09-09 (×2): qty 1

## 2018-09-09 MED ORDER — ACETAMINOPHEN 325 MG PO TABS
650.0000 mg | ORAL_TABLET | Freq: Three times a day (TID) | ORAL | Status: DC | PRN
  Administered 2018-09-09 – 2018-09-10 (×2): 650 mg via ORAL
  Filled 2018-09-09 (×3): qty 2

## 2018-09-09 MED ORDER — ONDANSETRON HCL 4 MG/2ML IJ SOLN: 4.0000 mg | Freq: Four times a day (QID) | INTRAMUSCULAR | Status: DC | PRN

## 2018-09-09 MED ORDER — SENNOSIDES-DOCUSATE SODIUM 8.6-50 MG PO TABS
1.0000 | ORAL_TABLET | Freq: Every evening | ORAL | Status: DC | PRN
  Filled 2018-09-09: qty 1

## 2018-09-09 NOTE — Progress Notes (Signed)
Pt's ex-wife/friend/caregiver  Earline Mayotte expressed concerns for pt's continued disorientation pt has, and would like to speak with MD.  She has been helping to care for him and knows much of his history.  Message sent to Dr Zigmund Daniel requesting she call her.

## 2018-09-09 NOTE — ED Notes (Signed)
Pt is aware we need a urine sample. 

## 2018-09-09 NOTE — H&P (Addendum)
History and Physical    Logan Phillips RWE:[id number] DOB: 08-05-1950 DOA: 09/08/2018  Referring MD/NP/PA:   PCP: Sandi Mariscal, MD   Patient coming from:  The patient is coming from home.  At baseline, pt is independent for most of ADL.        Chief Complaint: AMS  HPI: Logan Phillips is a 68 y.o. male with medical history significant of hypertension, hyperlipidemia, GERD, vestibular schwannoma (s/p of resection), heavy alcohol abuse, CKD stage III, who presents with altered mental status.  Patient has AMS, and he could answer some questions but with limited information. Therefore, most of the history is obtained by discussing the case with ED physician, per EMS report, and with the nursing staff.  History is limited.  Per report, pt has been confused in the past several days, not sure exactly when who was last seen normal. His ex-wife reported to EDP that her oldest daughter noticed that he seemed confused several days ago when she took him to the doctor and he asked her to sign as his guardian.  Patient's roommate states that last night he was roaming around outside confused and they could not get him back in the house. When I saw pt in ED, he is confused, but still knows place, year 2020, and his own name. He hard of hearing.  He moves all extremities normally.  No facial droop or slurred speech.  He does not have active cough, respiratory distress, nausea vomiting or diarrhea.  He denies chest pain or abdominal pain.  No symptoms of UTI. Of note, he has right arm injury recently and was put on splint by Dr. Percell Miller per report. It is unclear how he injured his right arm. Ex-wife states that they think it may be from a fall that he had in April. He was recently prescribed Flexeril. He is also taking oxycodone at home. His ex-wife is concerned that Flexeril may have caused his confusion. Per his wife, pt has not been drinking alcohol since February.   ED Course: pt was found to have WBC 7.0, INR 1.1,  PTT 42, alcohol level less than 10, ammonia 16, COVID-19 test pending, worsening renal function, temperature normal, initially tachycardia, oxygen saturation 93 to 95% on room air.  CT head is negative for acute intracranial abnormalities.  Patient is placed on telemetry bed for observation.  # CT-head: 1. No acute intracranial abnormality. 2. Interval decrease in size of both the lateral and third ventricles. 3. Interval decrease in size the extra-axial fluid collection in the right posterior fossa.  Review of Systems: Could not be reviewed accurately due to altered mental status.   Allergy: No Known Allergies  Past Medical History:  Diagnosis Date  . Chronic pain in shoulder 2018   [place]  . H/O echocardiogram 10/08/2016   EF 65-70%, normal wall motion, systolic function vigorous  . Hearing loss    pending hearing aids 06/2014  . Hypertension   . Lipoma   . Varicose vein   . Vestibular schwannoma ([place]) 04/2018    Past Surgical History:  Procedure Laterality Date  . APPENDECTOMY    . APPLICATION OF CRANIAL NAVIGATION Right 05/09/2018   Procedure: APPLICATION OF CRANIAL NAVIGATION;  Surgeon: Consuella Lose, MD;  Location: [facility];  Service: Neurosurgery;  Laterality: Right;  . BIOPSY  04/12/2018   Procedure: BIOPSY;  Surgeon: Juanita Craver, MD;  Location: [facility] ENDOSCOPY;  Service: Endoscopy;;  . COLONOSCOPY     age 83, he did  not f/u with referral 2018  . COLONOSCOPY WITH PROPOFOL N/A 04/12/2018   Procedure: COLONOSCOPY WITH PROPOFOL;  Surgeon: Juanita Craver, MD;  Location: [facility] ENDOSCOPY;  Service: Endoscopy;  Laterality: N/A;  . CRANIOTOMY Right 05/09/2018   Procedure: Right retrosigmoid craniectomy, resection of acoustic neuroma with intraoperative facial monitoring/Brain Lab;  Surgeon: Consuella Lose, MD;  Location: [facility];  Service: Neurosurgery;  Laterality: Right;  . LUMBAR DISC SURGERY  2002   Dr. Rita Ohara  . WISDOM TOOTH EXTRACTION      Social  History:  reports that he has never smoked. He has never used smokeless tobacco. He reports previous alcohol use of about 32.0 standard drinks of alcohol per week. He reports that he does not use drugs.  Family History:  Family History  Problem Relation Age of Onset  . Hypertension Mother   . Dementia Father   . Parkinsonism Father   . Diabetes Father        early stages  . Cancer Maternal Grandmother        breast  . Heart disease Neg Hx   . Stroke Neg Hx      Prior to Admission medications   Medication Sig Start Date End Date Taking? Authorizing Provider  acetaminophen (TYLENOL 8 HOUR) 650 MG CR tablet Take 1 tablet (650 mg total) by mouth every 8 (eight) hours as needed for pain. 04/18/18   Tysinger, Camelia Eng, PA-C  lisinopril (PRINIVIL,ZESTRIL) 20 MG tablet Take 1 tablet (20 mg total) by mouth daily. 10/06/17   Tysinger, Camelia Eng, PA-C  magnesium oxide (MAG-OX) 400 MG tablet Take 400 mg by mouth 2 (two) times daily.    [provider]  metoprolol tartrate (LOPRESSOR) 100 MG tablet Take 1 tablet (100 mg total) by mouth 2 (two) times daily. 10/06/17   Tysinger, Camelia Eng, PA-C  Multiple Vitamin (MULITIVITAMIN WITH MINERALS) TABS Take 1 tablet by mouth daily.    [provider]  omeprazole (PRILOSEC) 40 MG capsule Take 1 capsule (40 mg total) by mouth daily. 02/22/18   Tysinger, Camelia Eng, PA-C  oxyCODONE (ROXICODONE) 5 MG immediate release tablet Take 1 tablet (5 mg total) by mouth every 4 (four) hours as needed for severe pain. 05/17/18   Costella, Vista Mink, PA-C  pravastatin (PRAVACHOL) 20 MG tablet Take 1 tablet (20 mg total) by mouth every evening. Patient not taking: Reported on 04/18/2018 10/06/17 10/06/18  Carlena Hurl, PA-C    Physical Exam: Vitals:   09/09/18 0100 09/09/18 0115 09/09/18 0130 09/09/18 0202  BP: 105/70 113/74 (!) 133/99 (!) 156/101  Pulse: 85 85 91 (!) 114  Resp: 14 14 15 20   Temp:    98.4 F (36.9 C)  TempSrc:    Oral  SpO2: 95% 94% 98% 98%    General: Not in acute distress. Pt is mildly tremulous. HEENT:       Eyes: PERRL, EOMI, no scleral icterus.       ENT: No discharge from the ears and nose, no pharynx injection, no tonsillar enlargement.        Neck: No JVD, no bruit, no mass felt. Heme: No neck lymph node enlargement. Cardiac: S1/S2, RRR, No murmurs, No gallops or rubs. Respiratory: No rales, wheezing, rhonchi or rubs. GI: Soft, nondistended, nontender, no organomegaly, BS present. GU: No hematuria Ext: No pitting leg edema bilaterally. 2+DP/PT pulse bilaterally. Wearing splint in right arm. Musculoskeletal: No joint deformities, No joint redness or warmth, no limitation of ROM in spin. Skin: No rashes.  Neuro: confused, but knows his own name, place of hospital and the year 2020, cranial nerves II-XII grossly intact, moves all extremities.  Psych: Patient is not psychotic, no suicidal or hemocidal ideation.  Labs on Admission: I have personally reviewed following labs and imaging studies  CBC: Recent Labs  Lab 09/08/18 2041 09/08/18 2139  WBC 7.6  --   NEUTROABS 5.3  --   HGB 12.2* 12.6*  HCT 36.9* 37.0*  MCV 85.2  --   PLT 212  --    Basic Metabolic Panel: Recent Labs  Lab 09/08/18 2041 09/08/18 2139  NA 134* 133*  K 4.8 4.8  CL 99 101  CO2 23  --   GLUCOSE 90 88  BUN 30* 32*  CREATININE 1.72* 1.70*  CALCIUM 9.7  --    GFR: CrCl cannot be calculated (Unknown ideal weight.). Liver Function Tests: Recent Labs  Lab 09/08/18 2041  AST 15  ALT 9  ALKPHOS 51  BILITOT 0.9  PROT 8.4*  ALBUMIN 4.4   No results for input(s): LIPASE, AMYLASE in the last 168 hours. Recent Labs  Lab 09/08/18 2359  AMMONIA 16   Coagulation Profile: Recent Labs  Lab 09/08/18 2041  INR 1.1   Cardiac Enzymes: No results for input(s): CKTOTAL, CKMB, CKMBINDEX, TROPONINI in the last 168 hours. BNP (last 3 results) No results for input(s): PROBNP in the last 8760 hours. HbA1C: No results for input(s): HGBA1C  in the last 72 hours. CBG: Recent Labs  Lab 09/08/18 2019  GLUCAP 78   Lipid Profile: No results for input(s): CHOL, HDL, LDLCALC, TRIG, CHOLHDL, LDLDIRECT in the last 72 hours. Thyroid Function Tests: No results for input(s): TSH, T4TOTAL, FREET4, T3FREE, THYROIDAB in the last 72 hours. Anemia Panel: No results for input(s): VITAMINB12, FOLATE, FERRITIN, TIBC, IRON, RETICCTPCT in the last 72 hours. Urine analysis:    Component Value Date/Time   LABSPEC 1.020 10/05/2017 0956   BILIRUBINUR negative 10/05/2017 0956   BILIRUBINUR NEG 07/03/2013 1015   KETONESUR negative 10/05/2017 0956   PROTEINUR negative 10/05/2017 0956   PROTEINUR NEG 07/03/2013 1015   UROBILINOGEN negative 07/03/2013 1015   NITRITE Negative 10/05/2017 0956   NITRITE NEG 07/03/2013 1015   LEUKOCYTESUR Trace (A) 10/05/2017 0956   Sepsis Labs: @LABRCNTIP (procalcitonin:4,lacticidven:4) )No results found for this or any previous visit (from the past 240 hour(s)).   Radiological Exams on Admission: Ct Head Wo Contrast  Result Date: 09/08/2018 CLINICAL DATA:  Confusion. EXAM: CT HEAD WITHOUT CONTRAST TECHNIQUE: Contiguous axial images were obtained from the base of the skull through the vertex without intravenous contrast. COMPARISON:  May 20, 2018 FINDINGS: Brain: There is no evidence for acute intracranial hemorrhage or midline shift. There is a decreasing extra-axial fluid collection in the right posterior fossa. The size of the lateral and third ventricles has significantly improved since the prior study. There is persistent but improved edema in the right brachium pontis. Vascular: No hyperdense vessel or unexpected calcification. Skull: The patient is status post right retrosigmoid craniectomy Sinuses/Orbits: There is mild bilateral maxillary mucosal thickening. There is ethmoid air cell mucosal thickening. There is some mucosal thickening of the left sphenoid sinus. The frontal sinuses are essentially clear. The  mastoid air cells are clear. Other: None. IMPRESSION: 1. No acute intracranial abnormality. 2. Interval decrease in size of both the lateral and third ventricles. 3. Interval decrease in size the extra-axial fluid collection in the right posterior fossa. Electronically Signed   By: Constance Holster M.D.   On:  09/08/2018 21:18     EKG: Independently reviewed.  Sinus rhythm, QTC 339, Q waves in lead III, early R wave progression, mild T wave inversion in V4-V5.  Assessment/Plan Principal Problem:   Acute metabolic encephalopathy Active Problems:   Essential hypertension   Alcohol consumption heavy   Gastroesophageal reflux disease   Schwannoma   HLD (hyperlipidemia)   Acute renal failure superimposed on stage 3 chronic kidney disease (HCC)   Acute metabolic encephalopathy: Etiology is not clear. Differential diagnosis include alcohol withdrawal, early dementia, delirium, side effects from Flexeril and oxycodone.  No focal neurologic findings on physical examination.  CT head is negative for acute intracranial abnormalities. Ammonia normal at 16.  -place on telemetry bed for observation -Frequent neuro exam -Hold Flexeril and oxycodone -Check TSH, vitamin B12 level -Start CIWA protocol -Follow-up urinalysis and UDS  Essential hypertension: -Hold lisinopril due to worsening renal function -Continue metoprolol -IV hydralazine as needed  Alcohol consumption heavy: Per his wife, pt has not been drinking alcohol since February, but patient is mildly tremulous on physical examination indicating possible alcohol withdrawal. - CiWA protocol  Gastroesophageal reflux disease:  -Protonix  Schwannoma: s/p of resection. -no acute issues  HLD (hyperlipidemia): -Pravastatin  AoCKD-III: Baseline Cre is 1.09 on 05/20/18, pt's Cre is 1.7 and BUN 32 on admission. Likely due to prerenal secondary to dehydration and continuation of ACEI. - IVF: 1L NS, then 75 cc/h - Follow up renal function by  BMP - Hold lisinopril    DVT ppx:  SQ Lovenox Code Status: Full code Family Communication: None at bed side.   Disposition Plan:  Anticipate discharge back to previous home environment Consults called:  none Admission status: Obs / tele  n       Date of Service 09/09/2018    [facility]   If 7PM-7AM, please contact night-coverage [email or web address] Password [email or web address] 09/09/2018, 2:19 AM

## 2018-09-09 NOTE — Plan of Care (Signed)
68 year old male admitted with confusion disorientation wandering a neighbor heard unable to recall the incident admitted with increasing confusion.  He has a history of acoustic neuroma resected by neurosurgery Dr. Kathyrn Sheriff alcohol abuse.  Patient now has a fracture of the right upper arm and had a splint placed at [facility] office prior to coming to the ER.  It is unclear how he fractured that arm.  No documented falls though he had a fall in April.  Oxycodone and Flexeril on June 11.  Though patient has a heavy alcohol drinking history he has not had any alcohol since February.  T of the head showed no acute intracranial abnormalities.  At this time patient has not gotten out of bed since admission.  He is somewhat still dazed but he knows he is in the hospital.  He does know who is the president and wants him reelected.!!  He continues to complain of right shoulder pain.  Overnight he received IV fluids his renal functions improved to 1.23 creatinine down from 1.72 at the time of coming to the ER.  His TSH is 1.13.  His blood pressure is elevated at 1 58/87 heart rate of 9799% on room air afebrile.  No evidence of infection noted so far.  For now I will continue IV fluids get physical therapy evaluation hold  narcotics and monitor his renal functions in the morning.  He lives at home with his partner who is not able to care for him.

## 2018-09-10 ENCOUNTER — Observation Stay (HOSPITAL_COMMUNITY): Payer: Medicare HMO

## 2018-09-10 DIAGNOSIS — M19032 Primary osteoarthritis, left wrist: Secondary | ICD-10-CM | POA: Diagnosis not present

## 2018-09-10 DIAGNOSIS — G9341 Metabolic encephalopathy: Secondary | ICD-10-CM

## 2018-09-10 DIAGNOSIS — M11232 Other chondrocalcinosis, left wrist: Secondary | ICD-10-CM | POA: Diagnosis not present

## 2018-09-10 LAB — BASIC METABOLIC PANEL
Anion gap: 8 (ref 5–15)
BUN: 15 mg/dL (ref 8–23)
CO2: 23 mmol/L (ref 22–32)
Calcium: 9.1 mg/dL (ref 8.9–10.3)
Chloride: 107 mmol/L (ref 98–111)
Creatinine, Ser: 0.86 mg/dL (ref 0.61–1.24)
GFR calc Af Amer: >60 mL/min (ref >60)
GFR calc non Af Amer: >60 mL/min (ref >60)
Glucose, Bld: 98 mg/dL (ref 70–99)
Potassium: 4 mmol/L (ref 3.5–5.1)
Sodium: 138 mmol/L (ref 135–145)

## 2018-09-10 NOTE — Progress Notes (Signed)
Pt and ex-wife understood discharge instructions. RN went over medications, f/u appointments, and educated on opioids. Pt has been discharged home.

## 2018-09-10 NOTE — Discharge Summary (Signed)
Physician Discharge Summary  Logan Phillips VQM:[id number] DOB: 1950/05/14 DOA: 09/08/2018  PCP: Sandi Mariscal, MD  Admit date: 09/08/2018 Discharge date: 09/10/2018  admit from home Disposition: home  Recommendations for Outpatient Follow-up:  1. Follow up with PCP in 1-2 weeks 2. Please obtain BMP/CBC in one week 3. Please follow up with dr Ala Bent   Home Health none Equipment/Devices none Discharge Condition:stable CODE STATUS:full Diet recommendation cardiac Brief/Interim Summary:68 y.o. male with medical history significant of hypertension, hyperlipidemia, GERD, vestibular schwannoma (s/p of resection), heavy alcohol abuse, CKD stage III, who presents with altered mental status.  Patient has AMS, and he could answer some questions but with limited information. Therefore, most of the history is obtained by discussing the case with ED physician, per EMS report, and with the nursing staff.  History is limited.  Per report, pt has been confused in the past several days, not sure exactly when who was last seen normal. His ex-wife reported to EDP that her oldest daughter noticed that he seemed confused several days ago when she took him to the doctor and he asked her to sign as his guardian. Patient's roommate states that last night he was roaming around outside confused and they could not get him back in the house. When I saw pt in ED, he is confused, but still knows place, year 2020, and his own name. He hard of hearing.  He moves all extremities normally.  No facial droop or slurred speech.  He does not have active cough, respiratory distress, nausea vomiting or diarrhea.  He denies chest pain or abdominal pain.  No symptoms of UTI. Of note, he has right arm injury recently and was put on splint by Dr. Percell Miller per report. It is unclear how he injured his right arm. Ex-wife states that they think it may be from a fall that he had in April. He was recently prescribed Flexeril. He is also taking  oxycodone at home. His ex-wife is concerned that Flexeril may have caused his confusion. Per his wife, pt has not been drinking alcohol since February.   ED Course: pt was found to have WBC 7.0, INR 1.1, PTT 42, alcohol level less than 10, ammonia 16, COVID-19 test pending, worsening renal function, temperature normal, initially tachycardia, oxygen saturation 93 to 95% on room air.  CT head is negative for acute intracranial abnormalities.  Patient is placed on telemetry bed for observation.  # CT-head: 1. No acute intracranial abnormality. 2. Interval decrease in size of both the lateral and third ventricles. 3. Interval decrease in size the extra-axial fluid collection in the right posterior fossa  Discharge Diagnoses:  Principal Problem:   Acute metabolic encephalopathy Active Problems:   Essential hypertension   Alcohol consumption heavy   Gastroesophageal reflux disease   Schwannoma   HLD (hyperlipidemia)   Acute renal failure superimposed on stage 3 chronic kidney disease (HCC)  Acute metabolic encephalopathy:  Possibly secondary to early dementia with narcotics and muscle relaxants on board.  Alcohol level was only less than 10.  His mental status improved during the hospital stay.  He was seen by physical therapy prior to discharge.  They did not recommend home health at this time.  His ex-wife is going to take him home and is arranging 24-hour care at home.   CT head is negative for acute intracranial abnormalities. Ammonia normal at 16.  DC Flexeril upon discharge oxycodone PRN.  Urine drug screen was positive only for opiates.  TSH 1.130 B12  264.  Essential hypertension: Restart lisinopril and continue metoprolol.  Alcohol consumption heavy: Per his wife, pt has not been drinking alcohol since February, his alcohol level was less than 10.    Gastroesophageal reflux disease:  -Continue Protonix  Schwannoma: s/p of resection. -no acute issues  HLD  (hyperlipidemia): -Pravastatin  AoCKD-III: Baseline Cre is 1.09 on 05/20/18, pt's Cre is 1.7 and BUN 32 on admission.  Patient received IV fluids overnight.   Estimated body mass index is 25.28 kg/m as calculated from the following:   Height as of this encounter: 5\' 7"  (1.702 m).   Weight as of this encounter: 73.2 kg.  Discharge Instructions   Allergies as of 09/10/2018   No Known Allergies     Medication List    STOP taking these medications   cyclobenzaprine 10 MG tablet Commonly known as: FLEXERIL   pravastatin 20 MG tablet Commonly known as: Pravachol     TAKE these medications   acetaminophen 650 MG CR tablet Commonly known as: Tylenol 8 Hour Take 1 tablet (650 mg total) by mouth every 8 (eight) hours as needed for pain.   diclofenac sodium 1 % Gel Commonly known as: VOLTAREN Apply 1 g topically daily as needed for pain.   lisinopril 20 MG tablet Commonly known as: ZESTRIL Take 1 tablet (20 mg total) by mouth daily.   magnesium oxide 400 MG tablet Commonly known as: MAG-OX Take 400 mg by mouth 2 (two) times daily.   metoprolol tartrate 100 MG tablet Commonly known as: LOPRESSOR Take 1 tablet (100 mg total) by mouth 2 (two) times daily.   multivitamin with minerals Tabs tablet Take 1 tablet by mouth daily.   omeprazole 40 MG capsule Commonly known as: PRILOSEC Take 1 capsule (40 mg total) by mouth daily.   oxyCODONE 5 MG immediate release tablet Commonly known as: Roxicodone Take 1 tablet (5 mg total) by mouth every 4 (four) hours as needed for severe pain. What changed: Another medication with the same name was removed. Continue taking this medication, and follow the directions you see here.      Follow-up Information    Sandi Mariscal, MD Follow up.   Specialty: Internal Medicine Contact information: [place] [address] [phone]        Consuella Lose, MD Follow up.   Specialty: Neurosurgery Contact  information: [address] [address] [phone]        Elsie Saas, MD Follow up.   Specialty: Orthopedic Surgery Contact information: [place] [address] [place] [address] [phone]          No Known Allergies  Consultations: Discussed with neurosurgery on the phone  Procedures/Studies: Ct Head Wo Contrast  Result Date: 09/08/2018 CLINICAL DATA:  Confusion. EXAM: CT HEAD WITHOUT CONTRAST TECHNIQUE: Contiguous axial images were obtained from the base of the skull through the vertex without intravenous contrast. COMPARISON:  May 20, 2018 FINDINGS: Brain: There is no evidence for acute intracranial hemorrhage or midline shift. There is a decreasing extra-axial fluid collection in the right posterior fossa. The size of the lateral and third ventricles has significantly improved since the prior study. There is persistent but improved edema in the right brachium pontis. Vascular: No hyperdense vessel or unexpected calcification. Skull: The patient is status post right retrosigmoid craniectomy Sinuses/Orbits: There is mild bilateral maxillary mucosal thickening. There is ethmoid air cell mucosal thickening. There is some mucosal thickening of the left sphenoid sinus. The frontal  sinuses are essentially clear. The mastoid air cells are clear. Other: None. IMPRESSION: 1. No acute intracranial abnormality. 2. Interval decrease in size of both the lateral and third ventricles. 3. Interval decrease in size the extra-axial fluid collection in the right posterior fossa. Electronically Signed   By: Constance Holster M.D.   On: 09/08/2018 21:18    (Echo, Carotid, EGD, Colonoscopy, ERCP)    Subjective: Walked with physical therapy awake alert oriented to hospital answered all my questions appropriately  Discharge Exam: Vitals:   09/10/18 0448 09/10/18 0818  BP: 138/85 (!) 141/96  Pulse: 79 80  Resp: 17 15  Temp: 97.8 F (36.6 C) 98.2  F (36.8 C)  SpO2: 99% 100%   Vitals:   09/09/18 2312 09/10/18 0000 09/10/18 0448 09/10/18 0818  BP: (!) 150/89 (!) 150/89 138/85 (!) 141/96  Pulse: 72 72 79 80  Resp: 16  17 15   Temp: 98.6 F (37 C)  97.8 F (36.6 C) 98.2 F (36.8 C)  TempSrc: Oral  Oral Oral  SpO2: 96%  99% 100%  Weight:      Height:        General: Pt is alert, awake, not in acute distress Cardiovascular: RRR, S1/S2 +, no rubs, no gallops Respiratory: CTA bilaterally, no wheezing, no rhonchi Abdominal: Soft, NT, ND, bowel sounds + Extremities: no edema, no cyanosis    The results of significant diagnostics from this hospitalization (including imaging, microbiology, ancillary and laboratory) are listed below for reference.     Microbiology: Recent Results (from the past 240 hour(s))  SARS Coronavirus 2 (CEPHEID - Performed in [place] hospital lab), Hosp Order     Status: None   Collection Time: 09/09/18  1:43 AM   Specimen: Nasopharyngeal Swab  Result Value Ref Range Status   SARS Coronavirus 2 NEGATIVE NEGATIVE Final    Comment: (NOTE) If result is NEGATIVE SARS-CoV-2 target nucleic acids are NOT DETECTED. The SARS-CoV-2 RNA is generally detectable in upper and lower  respiratory specimens during the acute phase of infection. The lowest  concentration of SARS-CoV-2 viral copies this assay can detect is 250  copies / mL. A negative result does not preclude SARS-CoV-2 infection  and should not be used as the sole basis for treatment or other  patient management decisions.  A negative result may occur with  improper specimen collection / handling, submission of specimen other  than nasopharyngeal swab, presence of viral mutation(s) within the  areas targeted by this assay, and inadequate number of viral copies  (<250 copies / mL). A negative result must be combined with clinical  observations, patient history, and epidemiological information. If result is POSITIVE SARS-CoV-2 target nucleic  acids are DETECTED. The SARS-CoV-2 RNA is generally detectable in upper and lower  respiratory specimens dur ing the acute phase of infection.  Positive  results are indicative of active infection with SARS-CoV-2.  Clinical  correlation with patient history and other diagnostic information is  necessary to determine patient infection status.  Positive results do  not rule out bacterial infection or co-infection with other viruses. If result is PRESUMPTIVE POSTIVE SARS-CoV-2 nucleic acids MAY BE PRESENT.   A presumptive positive result was obtained on the submitted specimen  and confirmed on repeat testing.  While 2019 novel coronavirus  (SARS-CoV-2) nucleic acids may be present in the submitted sample  additional confirmatory testing may be necessary for epidemiological  and / or clinical management purposes  to differentiate between  SARS-CoV-2 and other Sarbecovirus currently  known to infect humans.  If clinically indicated additional testing with an alternate test  methodology [phone]) is advised. The SARS-CoV-2 RNA is generally  detectable in upper and lower respiratory sp ecimens during the acute  phase of infection. The expected result is Negative. Fact Sheet for Patients:  StrictlyIdeas.no Fact Sheet for Healthcare Providers: BankingDealers.co.za This test is not yet approved or cleared by the [place] FDA and has been authorized for detection and/or diagnosis of SARS-CoV-2 by FDA under an Emergency Use Authorization (EUA).  This EUA will remain in effect (meaning this test can be used) for the duration of the COVID-19 declaration under Section 564(b)(1) of the Act, 21 U.S.C. section 360bbb-3(b)(1), unless the authorization is terminated or revoked sooner. Performed at [facility], [place] [address] [place], [place] [address]      Labs: BNP (last 3 results) No results for input(s): BNP in the last 8760  hours. Basic Metabolic Panel: Recent Labs  Lab 09/08/18 2041 09/08/18 2139 09/09/18 0730  NA 134* 133* 136  K 4.8 4.8 4.5  CL 99 101 101  CO2 23  --  23  GLUCOSE 90 88 88  BUN 30* 32* 25*  CREATININE 1.72* 1.70* 1.23  CALCIUM 9.7  --  9.1   Liver Function Tests: Recent Labs  Lab 09/08/18 2041  AST 15  ALT 9  ALKPHOS 51  BILITOT 0.9  PROT 8.4*  ALBUMIN 4.4   No results for input(s): LIPASE, AMYLASE in the last 168 hours. Recent Labs  Lab 09/08/18 2359  AMMONIA 16   CBC: Recent Labs  Lab 09/08/18 2041 09/08/18 2139 09/09/18 0730  WBC 7.6  --  5.6  NEUTROABS 5.3  --   --   HGB 12.2* 12.6* 11.0*  HCT 36.9* 37.0* 33.2*  MCV 85.2  --  82.6  PLT 212  --  182   Cardiac Enzymes: No results for input(s): CKTOTAL, CKMB, CKMBINDEX, TROPONINI in the last 168 hours. BNP: Invalid input(s): POCBNP CBG: Recent Labs  Lab 09/08/18 2019  GLUCAP 78   D-Dimer No results for input(s): DDIMER in the last 72 hours. Hgb A1c No results for input(s): HGBA1C in the last 72 hours. Lipid Profile No results for input(s): CHOL, HDL, LDLCALC, TRIG, CHOLHDL, LDLDIRECT in the last 72 hours. Thyroid function studies Recent Labs    09/09/18 0730  TSH 1.130   Anemia work up Recent Labs    09/09/18 0730  VITAMINB12 264   Urinalysis    Component Value Date/Time   LABSPEC 1.020 10/05/2017 0956   BILIRUBINUR negative 10/05/2017 0956   BILIRUBINUR NEG 07/03/2013 1015   KETONESUR negative 10/05/2017 0956   PROTEINUR negative 10/05/2017 0956   PROTEINUR NEG 07/03/2013 1015   UROBILINOGEN negative 07/03/2013 1015   NITRITE Negative 10/05/2017 0956   NITRITE NEG 07/03/2013 1015   LEUKOCYTESUR Trace (A) 10/05/2017 0956   Sepsis Labs Invalid input(s): PROCALCITONIN,  WBC,  LACTICIDVEN Microbiology Recent Results (from the past 240 hour(s))  SARS Coronavirus 2 (CEPHEID - Performed in [place] hospital lab), Hosp Order     Status: None   Collection Time: 09/09/18  1:43 AM    Specimen: Nasopharyngeal Swab  Result Value Ref Range Status   SARS Coronavirus 2 NEGATIVE NEGATIVE Final    Comment: (NOTE) If result is NEGATIVE SARS-CoV-2 target nucleic acids are NOT DETECTED. The SARS-CoV-2 RNA is generally detectable in upper and lower  respiratory specimens during the acute phase of infection. The lowest  concentration of SARS-CoV-2  viral copies this assay can detect is 250  copies / mL. A negative result does not preclude SARS-CoV-2 infection  and should not be used as the sole basis for treatment or other  patient management decisions.  A negative result may occur with  improper specimen collection / handling, submission of specimen other  than nasopharyngeal swab, presence of viral mutation(s) within the  areas targeted by this assay, and inadequate number of viral copies  (<250 copies / mL). A negative result must be combined with clinical  observations, patient history, and epidemiological information. If result is POSITIVE SARS-CoV-2 target nucleic acids are DETECTED. The SARS-CoV-2 RNA is generally detectable in upper and lower  respiratory specimens dur ing the acute phase of infection.  Positive  results are indicative of active infection with SARS-CoV-2.  Clinical  correlation with patient history and other diagnostic information is  necessary to determine patient infection status.  Positive results do  not rule out bacterial infection or co-infection with other viruses. If result is PRESUMPTIVE POSTIVE SARS-CoV-2 nucleic acids MAY BE PRESENT.   A presumptive positive result was obtained on the submitted specimen  and confirmed on repeat testing.  While 2019 novel coronavirus  (SARS-CoV-2) nucleic acids may be present in the submitted sample  additional confirmatory testing may be necessary for epidemiological  and / or clinical management purposes  to differentiate between  SARS-CoV-2 and other Sarbecovirus currently known to infect humans.  If  clinically indicated additional testing with an alternate test  methodology [phone]) is advised. The SARS-CoV-2 RNA is generally  detectable in upper and lower respiratory sp ecimens during the acute  phase of infection. The expected result is Negative. Fact Sheet for Patients:  StrictlyIdeas.no Fact Sheet for Healthcare Providers: BankingDealers.co.za This test is not yet approved or cleared by the [place] FDA and has been authorized for detection and/or diagnosis of SARS-CoV-2 by FDA under an Emergency Use Authorization (EUA).  This EUA will remain in effect (meaning this test can be used) for the duration of the COVID-19 declaration under Section 564(b)(1) of the Act, 21 U.S.C. section 360bbb-3(b)(1), unless the authorization is terminated or revoked sooner. Performed at [facility], [place] [address], [place], [place] [address]      Time coordinating discharge: 33 minutes  SIGNED:   Georgette Shell, MD  [facility] 09/10/2018, 8:56 AM Pager   If 7PM-7AM, please contact night-coverage [email or web address] Password [email or web address]

## 2018-09-10 NOTE — Evaluation (Signed)
Physical Therapy Evaluation & Discharge Patient Details Name: Logan Phillips MRN: [id number] DOB: 05/15/1950 Today's Date: 09/10/2018   History of Present Illness  Pt is a 68 y.o. male admitted 09/08/18 with AMS; etiology unknown, likely multifactorial. Head CT negative for acute abnormality. Of note, pt with recent fall causing RUE injury (per chart, imaging showed age indeterminate R olecranon process fx on 07/15/18; pt reports placed in cast by Dr. Percell Miller). Other PMH includes HTN, CKD III, vestibular schwannoma (s/p resection), heavy alcohol use.    Clinical Impression  Patient evaluated by Physical Therapy with no further acute PT needs identified. PTA, pt indep and lives with roommate; has family nearby available for PRN assist. Today, pt ambulating well; supervision for safety due to fall risk. Educ re: RUE sling wear, ROM, elevation. Pt also with concern for L wrist fx (imaging ordered by MD). All education has been completed and the patient has no further questions. Acute PT is signing off. Thank you for this referral.    Follow Up Recommendations No PT follow up;Supervision - Intermittent    Equipment Recommendations  None recommended by PT    Recommendations for Other Services       Precautions / Restrictions Precautions Precautions: Fall Required Braces or Orthoses: Sling;Splint/Cast Splint/Cast: RUE cast/sling (apparently applied by [facility], pt unsure when -- RUE imaging from 07/15/18) Restrictions Other Position/Activity Restrictions: Assuming RUE NWB      Mobility  Bed Mobility Overal bed mobility: Independent                Transfers Overall transfer level: Independent                  Ambulation/Gait Ambulation/Gait assistance: Supervision Gait Distance (Feet): 300 Feet Assistive device: None Gait Pattern/deviations: Step-through pattern;Decreased stride length Gait velocity: Decreased Gait velocity interpretation: 1.31 - 2.62 ft/sec,  indicative of limited community ambulator General Gait Details: Slow, guarded gait with supervision for safety; no overt instability or LOB  Stairs            Wheelchair Mobility    Modified Rankin (Stroke Patients Only)       Balance Overall balance assessment: Needs assistance   Sitting balance-Leahy Scale: Good       Standing balance-Leahy Scale: Good                               Pertinent Vitals/Pain Pain Assessment: Faces Pain Score: 8  Pain Location: R elbow and L wrist Pain Descriptors / Indicators: Sore Pain Intervention(s): Monitored during session;Repositioned;Other (comment)(RUE sling adjusted)    Home Living Family/patient expects to be discharged to:: Private residence Living Arrangements: Non-relatives/Friends Available Help at Discharge: Friend(s);Available PRN/intermittently Type of Home: House Home Access: Stairs to enter Entrance Stairs-Rails: Right Entrance Stairs-Number of Steps: 2 Home Layout: One level Home Equipment: Cane - single point Additional Comments: Daughters and ex-wife check on patient PRN    Prior Function Level of Independence: Independent         Comments: Was using [facility] for community ambulation a few months ago, but no longer uses DME; reports a few falls in the past 3 months     Hand Dominance   Dominant Hand: Right    Extremity/Trunk Assessment   Upper Extremity Assessment Upper Extremity Assessment: RUE deficits/detail;LUE deficits/detail RUE Deficits / Details: RUE in sling (assuming due to age-indeterminate olecranon fx from imaging 08/628, but not certain); limited shoulder ROM, painful RUE:  Unable to fully assess due to immobilization RUE Coordination: decreased fine motor;decreased gross motor LUE Deficits / Details: L shoulder ROM limited, wrist flex/ext motion limited; pt concerned L wrist fx            Communication   Communication: HOH  Cognition Arousal/Alertness:  Awake/alert Behavior During Therapy: WFL for tasks assessed/performed Overall Cognitive Status: History of cognitive impairments - at baseline Area of Impairment: Attention;Memory;Following commands;Safety/judgement;Awareness;Problem solving                   Current Attention Level: Selective Memory: Decreased short-term memory Following Commands: Follows multi-step commands inconsistently Safety/Judgement: Decreased awareness of safety;Decreased awareness of deficits Awareness: Emergent Problem Solving: Requires verbal cues General Comments: A&O x4. impaired cognition likely exacerbated by HOH/deaf in one ear. Pt poor historian, contradicting details, but able to get majority of info across. Poor attention      General Comments General comments (skin integrity, edema, etc.): Pt concerned L wrist is fx; RN messaged MD for potential imaging    Exercises     Assessment/Plan    PT Assessment Patent does not need any further PT services  PT Problem List         PT Treatment Interventions      PT Goals (Current goals can be found in the Care Plan section)  Acute Rehab PT Goals PT Goal Formulation: All assessment and education complete, DC therapy    Frequency     Barriers to discharge        Co-evaluation               AM-PAC PT "6 Clicks" Mobility  Outcome Measure Help needed turning from your back to your side while in a flat bed without using bedrails?: None Help needed moving from lying on your back to sitting on the side of a flat bed without using bedrails?: None Help needed moving to and from a bed to a chair (including a wheelchair)?: None Help needed standing up from a chair using your arms (e.g., wheelchair or bedside chair)?: A Little Help needed to walk in hospital room?: A Little Help needed climbing 3-5 steps with a railing? : A Little 6 Click Score: 21    End of Session Equipment Utilized During Treatment: Other (comment)(RUE  sling) Activity Tolerance: Patient tolerated treatment well Patient left: in bed;with call bell/phone within reach;with bed alarm set Nurse Communication: Mobility status PT Visit Diagnosis: Other abnormalities of gait and mobility (R26.89);Pain;Repeated falls (R29.6)    Time: 7672-0947 PT Time Calculation (min) (ACUTE ONLY): 21 min   Charges:   PT Evaluation $PT Eval Moderate Complexity: Jensen, PT, DPT Acute Rehabilitation Services  Pager [phone] Office [phone]  Derry Lory 09/10/2018, 8:50 AM

## 2018-09-10 NOTE — Care Management Obs Status (Signed)
[place] [place] NOTIFICATION   Patient Details  Name: Logan Phillips MRN: [id number] Date of Birth: 07/22/1950   Medicare Observation Status Notification Given:  Yes    Carles Collet, RN 09/10/2018, 9:09 AM

## 2018-09-10 NOTE — Progress Notes (Signed)
Tele report: SR [id number].

## 2018-09-10 NOTE — Care Management (Signed)
Spoke w patient at bedside. He states that he lives at home with his roommate. He and his roommate both drive. He is insured and denies problems paying for medications. He has a RW and 3/1 at home and denies need for additional DME or home support. No other CM needs identified.

## 2018-09-12 DIAGNOSIS — M25532 Pain in left wrist: Secondary | ICD-10-CM | POA: Diagnosis not present

## 2018-09-20 DIAGNOSIS — M25511 Pain in right shoulder: Secondary | ICD-10-CM | POA: Diagnosis not present

## 2018-09-20 DIAGNOSIS — M25532 Pain in left wrist: Secondary | ICD-10-CM | POA: Diagnosis not present

## 2018-10-05 DIAGNOSIS — M545 Low back pain: Secondary | ICD-10-CM | POA: Diagnosis not present

## 2018-10-05 DIAGNOSIS — G8929 Other chronic pain: Secondary | ICD-10-CM | POA: Diagnosis not present

## 2018-10-05 DIAGNOSIS — Z79899 Other long term (current) drug therapy: Secondary | ICD-10-CM | POA: Diagnosis not present

## 2018-10-05 DIAGNOSIS — E559 Vitamin D deficiency, unspecified: Secondary | ICD-10-CM | POA: Diagnosis not present

## 2018-10-09 DIAGNOSIS — D225 Melanocytic nevi of trunk: Secondary | ICD-10-CM | POA: Diagnosis not present

## 2018-10-09 DIAGNOSIS — L821 Other seborrheic keratosis: Secondary | ICD-10-CM | POA: Diagnosis not present

## 2018-10-09 DIAGNOSIS — D171 Benign lipomatous neoplasm of skin and subcutaneous tissue of trunk: Secondary | ICD-10-CM | POA: Diagnosis not present

## 2018-10-09 DIAGNOSIS — L570 Actinic keratosis: Secondary | ICD-10-CM | POA: Diagnosis not present

## 2018-10-30 DIAGNOSIS — D333 Benign neoplasm of cranial nerves: Secondary | ICD-10-CM | POA: Diagnosis not present

## 2018-11-03 DIAGNOSIS — G8929 Other chronic pain: Secondary | ICD-10-CM | POA: Diagnosis not present

## 2018-11-03 DIAGNOSIS — Z79899 Other long term (current) drug therapy: Secondary | ICD-10-CM | POA: Diagnosis not present

## 2018-11-03 DIAGNOSIS — I10 Essential (primary) hypertension: Secondary | ICD-10-CM | POA: Diagnosis not present

## 2018-11-03 DIAGNOSIS — M545 Low back pain: Secondary | ICD-10-CM | POA: Diagnosis not present

## 2018-11-21 ENCOUNTER — Other Ambulatory Visit: Payer: Self-pay | Admitting: Physician Assistant

## 2018-11-21 DIAGNOSIS — D333 Benign neoplasm of cranial nerves: Secondary | ICD-10-CM

## 2018-11-30 DIAGNOSIS — Z1159 Encounter for screening for other viral diseases: Secondary | ICD-10-CM | POA: Diagnosis not present

## 2018-11-30 DIAGNOSIS — E7800 Pure hypercholesterolemia, unspecified: Secondary | ICD-10-CM | POA: Diagnosis not present

## 2018-11-30 DIAGNOSIS — M961 Postlaminectomy syndrome, not elsewhere classified: Secondary | ICD-10-CM | POA: Diagnosis not present

## 2018-11-30 DIAGNOSIS — G8929 Other chronic pain: Secondary | ICD-10-CM | POA: Diagnosis not present

## 2018-11-30 DIAGNOSIS — Z23 Encounter for immunization: Secondary | ICD-10-CM | POA: Diagnosis not present

## 2018-11-30 DIAGNOSIS — M545 Low back pain: Secondary | ICD-10-CM | POA: Diagnosis not present

## 2018-11-30 DIAGNOSIS — Z79899 Other long term (current) drug therapy: Secondary | ICD-10-CM | POA: Diagnosis not present

## 2018-11-30 DIAGNOSIS — I10 Essential (primary) hypertension: Secondary | ICD-10-CM | POA: Diagnosis not present

## 2018-12-04 DIAGNOSIS — R69 Illness, unspecified: Secondary | ICD-10-CM | POA: Diagnosis not present

## 2018-12-12 DIAGNOSIS — H903 Sensorineural hearing loss, bilateral: Secondary | ICD-10-CM | POA: Diagnosis not present

## 2018-12-13 DIAGNOSIS — M79642 Pain in left hand: Secondary | ICD-10-CM | POA: Diagnosis not present

## 2018-12-13 DIAGNOSIS — M654 Radial styloid tenosynovitis [de Quervain]: Secondary | ICD-10-CM | POA: Diagnosis not present

## 2018-12-16 ENCOUNTER — Other Ambulatory Visit: Payer: Self-pay

## 2018-12-16 ENCOUNTER — Ambulatory Visit
Admission: RE | Admit: 2018-12-16 | Discharge: 2018-12-16 | Disposition: A | Discharge status: Home / Self Care | Payer: Medicare HMO | Source: Ambulatory Visit | Attending: Physician Assistant | Admitting: Physician Assistant

## 2018-12-16 DIAGNOSIS — D333 Benign neoplasm of cranial nerves: Secondary | ICD-10-CM | POA: Diagnosis not present

## 2018-12-16 MED ORDER — GADOBENATE DIMEGLUMINE 529 MG/ML IV SOLN
15.0000 mL | Freq: Once | INTRAVENOUS | Status: AC | PRN
  Administered 2018-12-16: 15 mL via INTRAVENOUS

## 2018-12-18 DIAGNOSIS — D333 Benign neoplasm of cranial nerves: Secondary | ICD-10-CM | POA: Diagnosis not present

## 2018-12-18 DIAGNOSIS — I10 Essential (primary) hypertension: Secondary | ICD-10-CM | POA: Diagnosis not present

## 2018-12-18 DIAGNOSIS — Z6825 Body mass index (BMI) 25.0-25.9, adult: Secondary | ICD-10-CM | POA: Diagnosis not present

## 2018-12-29 DIAGNOSIS — M545 Low back pain: Secondary | ICD-10-CM | POA: Diagnosis not present

## 2018-12-29 DIAGNOSIS — G8929 Other chronic pain: Secondary | ICD-10-CM | POA: Diagnosis not present

## 2018-12-29 DIAGNOSIS — I10 Essential (primary) hypertension: Secondary | ICD-10-CM | POA: Diagnosis not present

## 2019-01-01 DIAGNOSIS — M654 Radial styloid tenosynovitis [de Quervain]: Secondary | ICD-10-CM | POA: Diagnosis not present

## 2019-01-01 DIAGNOSIS — M79642 Pain in left hand: Secondary | ICD-10-CM | POA: Diagnosis not present

## 2019-01-09 DIAGNOSIS — G510 Bell's palsy: Secondary | ICD-10-CM | POA: Diagnosis not present

## 2019-01-23 ENCOUNTER — Emergency Department (HOSPITAL_COMMUNITY)
Admission: EM | Admit: 2019-01-23 | Discharge: 2019-01-24 | Disposition: A | Discharge status: Home / Self Care | Payer: Medicare HMO | Attending: Emergency Medicine | Admitting: Emergency Medicine

## 2019-01-23 ENCOUNTER — Other Ambulatory Visit: Payer: Self-pay

## 2019-01-23 ENCOUNTER — Emergency Department (HOSPITAL_COMMUNITY): Payer: Medicare HMO

## 2019-01-23 ENCOUNTER — Encounter (HOSPITAL_COMMUNITY): Payer: Self-pay | Admitting: Emergency Medicine

## 2019-01-23 DIAGNOSIS — I129 Hypertensive chronic kidney disease with stage 1 through stage 4 chronic kidney disease, or unspecified chronic kidney disease: Secondary | ICD-10-CM | POA: Diagnosis not present

## 2019-01-23 DIAGNOSIS — Z79899 Other long term (current) drug therapy: Secondary | ICD-10-CM | POA: Diagnosis not present

## 2019-01-23 DIAGNOSIS — R531 Weakness: Secondary | ICD-10-CM | POA: Diagnosis not present

## 2019-01-23 DIAGNOSIS — R519 Headache, unspecified: Secondary | ICD-10-CM | POA: Diagnosis present

## 2019-01-23 DIAGNOSIS — R110 Nausea: Secondary | ICD-10-CM | POA: Diagnosis not present

## 2019-01-23 DIAGNOSIS — R509 Fever, unspecified: Secondary | ICD-10-CM

## 2019-01-23 DIAGNOSIS — N183 Chronic kidney disease, stage 3 unspecified: Secondary | ICD-10-CM | POA: Insufficient documentation

## 2019-01-23 DIAGNOSIS — Z20822 Contact with and (suspected) exposure to covid-19: Secondary | ICD-10-CM

## 2019-01-23 LAB — CBC WITH DIFFERENTIAL/PLATELET
Abs Immature Granulocytes: 0.02 10*3/uL (ref 0.00–0.07)
Basophils Absolute: 0 10*3/uL (ref 0.0–0.1)
Basophils Relative: 0 %
Eosinophils Absolute: 0.1 10*3/uL (ref 0.0–0.5)
Eosinophils Relative: 2 %
HCT: 41.4 % (ref 39.0–52.0)
Hemoglobin: 14.3 g/dL (ref 13.0–17.0)
Immature Granulocytes: 0 %
Lymphocytes Relative: 10 %
Lymphs Abs: 0.5 10*3/uL — ABNORMAL LOW (ref 0.7–4.0)
MCH: 30.8 pg (ref 26.0–34.0)
MCHC: 34.5 g/dL (ref 30.0–36.0)
MCV: 89 fL (ref 80.0–100.0)
Monocytes Absolute: 0.6 10*3/uL (ref 0.1–1.0)
Monocytes Relative: 12 %
Neutro Abs: 3.6 10*3/uL (ref 1.7–7.7)
Neutrophils Relative %: 76 %
Platelets: 92 10*3/uL — ABNORMAL LOW (ref 150–400)
RBC: 4.65 MIL/uL (ref 4.22–5.81)
RDW: 13.8 % (ref 11.5–15.5)
WBC: 4.8 10*3/uL (ref 4.0–10.5)
nRBC: 0 % (ref 0.0–0.2)

## 2019-01-23 LAB — COMPREHENSIVE METABOLIC PANEL
ALT: 22 U/L (ref 0–44)
AST: 29 U/L (ref 15–41)
Albumin: 5.2 g/dL — ABNORMAL HIGH (ref 3.5–5.0)
Alkaline Phosphatase: 60 U/L (ref 38–126)
Anion gap: 12 (ref 5–15)
BUN: 15 mg/dL (ref 8–23)
CO2: 25 mmol/L (ref 22–32)
Calcium: 9.4 mg/dL (ref 8.9–10.3)
Chloride: 100 mmol/L (ref 98–111)
Creatinine, Ser: 1.2 mg/dL (ref 0.61–1.24)
GFR calc Af Amer: >60 mL/min (ref >60)
GFR calc non Af Amer: >60 mL/min (ref >60)
Glucose, Bld: 107 mg/dL — ABNORMAL HIGH (ref 70–99)
Potassium: 3.8 mmol/L (ref 3.5–5.1)
Sodium: 137 mmol/L (ref 135–145)
Total Bilirubin: 1 mg/dL (ref 0.3–1.2)
Total Protein: 8.2 g/dL — ABNORMAL HIGH (ref 6.5–8.1)

## 2019-01-23 LAB — ETHANOL: Alcohol, Ethyl (B): <10 mg/dL (ref <10)

## 2019-01-23 MED ORDER — ACETAMINOPHEN 500 MG PO TABS
1000.0000 mg | ORAL_TABLET | Freq: Once | ORAL | Status: AC
  Administered 2019-01-23: 1000 mg via ORAL
  Filled 2019-01-23: qty 2

## 2019-01-23 NOTE — ED Triage Notes (Signed)
Patient reports headache today. States tested for Covid. Denies cough, SOB, chest pain. Febrile in triage.

## 2019-01-23 NOTE — ED Notes (Signed)
Patient keeps opening his door, attempting to walk out in the hallway. Notified multiple times to please keep the door shut and to use his call bell, re-educated on the call bell and and why his BP cuff would be getting tight q 30 min. Continues to open the door and tries to wander.

## 2019-01-24 NOTE — ED Provider Notes (Signed)
[facility] DEPT Provider Note   CSN: [id number] Arrival date & time: 01/23/19  2119     History   Chief Complaint Chief Complaint  Patient presents with  . Headache    HPI Logan Phillips is a 68 y.o. male.     HPI  Patient presents with concern of headache, generalized discomfort. Unclear when the symptoms actually began before this past day, possibly 2 or 3 he has felt poorly, fever, headache, nausea, weakness. He states that he is generally well, was well prior to this. Since onset no relief with anything, symptoms are persistently worsening. No confusion, disorientation, vomiting, diarrhea, unilateral weakness.  Past Medical History:  Diagnosis Date  . Chronic pain in shoulder 2018   [place]  . H/O echocardiogram 10/08/2016   EF 65-70%, normal wall motion, systolic function vigorous  . Hearing loss    pending hearing aids 06/2014  . Hypertension   . Lipoma   . Varicose vein   . Vestibular schwannoma ([place]) 04/2018    Patient Active Problem List   Diagnosis Date Noted  . HLD (hyperlipidemia) 09/09/2018  . Acute renal failure superimposed on stage 3 chronic kidney disease ([place]) 09/09/2018  . Acute metabolic encephalopathy [id number]  . Rib contusion, right, initial encounter 07/13/2018  . Fall (on) (from) other stairs and steps, initial encounter 07/13/2018  . Brain tumor ([place]) 05/09/2018  . Schwannoma 04/18/2018  . Acute ischemic colitis ([place]) 04/09/2018  . Arthritis 02/22/2018  . Alcohol consumption heavy 02/22/2018  . Gastroesophageal reflux disease 02/22/2018  . Skin lesion 10/05/2017  . Ataxia 10/05/2017  . Dizziness 10/05/2017  . Memory change 10/05/2017  . Chronic right shoulder pain 03/18/2017  . Screening for diabetes mellitus 10/04/2016  . Vaccine counseling 10/04/2016  . Encounter for health maintenance examination in adult 10/04/2016  . Need for pneumococcal vaccination 10/04/2016  . Lipoma of  torso 10/04/2016  . Hearing difficulty of both ears 10/04/2016  . Abnormal EKG 07/23/2014  . Varicose veins of left lower extremity   . Essential hypertension 07/08/2014  . Foot pain, left 04/14/2011    Past Surgical History:  Procedure Laterality Date  . APPENDECTOMY    . APPLICATION OF CRANIAL NAVIGATION Right 05/09/2018   Procedure: APPLICATION OF CRANIAL NAVIGATION;  Surgeon: Consuella Lose, MD;  Location: [facility];  Service: Neurosurgery;  Laterality: Right;  . BIOPSY  04/12/2018   Procedure: BIOPSY;  Surgeon: Juanita Craver, MD;  Location: [facility] ENDOSCOPY;  Service: Endoscopy;;  . COLONOSCOPY     age 51, he did not f/u with referral 2018  . COLONOSCOPY WITH PROPOFOL N/A 04/12/2018   Procedure: COLONOSCOPY WITH PROPOFOL;  Surgeon: Juanita Craver, MD;  Location: [facility] ENDOSCOPY;  Service: Endoscopy;  Laterality: N/A;  . CRANIOTOMY Right 05/09/2018   Procedure: Right retrosigmoid craniectomy, resection of acoustic neuroma with intraoperative facial monitoring/Brain Lab;  Surgeon: Consuella Lose, MD;  Location: [facility];  Service: Neurosurgery;  Laterality: Right;  . LUMBAR DISC SURGERY  2002   Dr. Rita Ohara  . WISDOM TOOTH EXTRACTION          Home Medications    Prior to Admission medications   Medication Sig Start Date End Date Taking? Authorizing Provider  acetaminophen (TYLENOL 8 HOUR) 650 MG CR tablet Take 1 tablet (650 mg total) by mouth every 8 (eight) hours as needed for pain. 04/18/18   Tysinger, Camelia Eng, PA-C  lisinopril (PRINIVIL,ZESTRIL) 20 MG tablet Take 1 tablet (20 mg total) by mouth daily. 10/06/17  Tysinger, Camelia Eng, PA-C  magnesium oxide (MAG-OX) 400 MG tablet Take 400 mg by mouth 2 (two) times daily.    [provider]  metoprolol tartrate (LOPRESSOR) 100 MG tablet Take 1 tablet (100 mg total) by mouth 2 (two) times daily. 10/06/17   Tysinger, Camelia Eng, PA-C  Multiple Vitamin (MULITIVITAMIN WITH MINERALS) TABS Take 1 tablet by mouth daily.    [provider]   Oxycodone HCl 10 MG TABS Take 5-10 mg by mouth every 6 (six) hours as needed (Pain).    [provider]    Family History Family History  Problem Relation Age of Onset  . Hypertension Mother   . Dementia Father   . Parkinsonism Father   . Diabetes Father        early stages  . Cancer Maternal Grandmother        breast  . Heart disease Neg Hx   . Stroke Neg Hx     Social History Social History   Tobacco Use  . Smoking status: Never Smoker  . Smokeless tobacco: Never Used  Substance Use Topics  . Alcohol use: Not Currently    Alcohol/week: 32.0 standard drinks    Types: 30 Cans of beer, 2 Shots of liquor per week    Comment: stopped 3 weeks ago 04/12/2018  . Drug use: No     Allergies   Patient has no known allergies.   Review of Systems Review of Systems  Constitutional:       Per HPI, otherwise negative  HENT:       Per HPI, otherwise negative  Respiratory:       Per HPI, otherwise negative  Cardiovascular:       Per HPI, otherwise negative  Gastrointestinal: Negative for vomiting.  Endocrine:       Negative aside from HPI  Genitourinary:       Neg aside from HPI   Musculoskeletal:       Per HPI, otherwise negative  Skin: Negative.   Neurological: Positive for weakness and headaches. Negative for syncope.     Physical Exam Updated Vital Signs BP (!) 162/125   Pulse 88   Temp (!) 101.6 F (38.7 C) (Oral)   Resp 15   Ht 5\' 8"  (1.727 m)   Wt 74.8 kg   SpO2 93%   BMI 25.09 kg/m   Physical Exam Vitals signs and nursing note reviewed.  Constitutional:      General: He is not in acute distress.    Appearance: He is well-developed.  HENT:     Head: Normocephalic and atraumatic.  Eyes:     Conjunctiva/sclera: Conjunctivae normal.  Neck:     Comments: No meningismus Cardiovascular:     Rate and Rhythm: Normal rate and regular rhythm.  Pulmonary:     Effort: Pulmonary effort is normal. No respiratory distress.     Breath sounds: No  stridor.  Abdominal:     General: There is no distension.  Skin:    General: Skin is warm and dry.  Neurological:     Mental Status: He is alert and oriented to person, place, and time.     Comments: Slight deviation nasolabial fold left greater than right  Psychiatric:        Mood and Affect: Mood is anxious.      ED Treatments / Results  Labs (all labs ordered are listed, but only abnormal results are displayed) Labs Reviewed  COMPREHENSIVE METABOLIC PANEL - Abnormal; Notable for  the following components:      Result Value   Glucose, Bld 107 (*)    Total Protein 8.2 (*)    Albumin 5.2 (*)    All other components within normal limits  CBC WITH DIFFERENTIAL/PLATELET - Abnormal; Notable for the following components:   Platelets 92 (*)    Lymphs Abs 0.5 (*)    All other components within normal limits  ETHANOL  URINALYSIS, ROUTINE W REFLEX MICROSCOPIC    Radiology Dg Chest Port 1 View  Result Date: 01/23/2019 CLINICAL DATA:  Fever EXAM: PORTABLE CHEST 1 VIEW COMPARISON:  05/09/2018 FINDINGS: The heart size and mediastinal contours are within normal limits. Both lungs are clear. The visualized skeletal structures are unremarkable. IMPRESSION: No active disease. Electronically Signed   By: Donavan Foil M.D.   On: 01/23/2019 22:18    Procedures Procedures (including critical care time)  Medications Ordered in ED Medications  acetaminophen (TYLENOL) tablet 1,000 mg (1,000 mg Oral Given 01/23/19 2211)     Initial Impression / Assessment and Plan / ED Course  I have reviewed the triage vital signs and the nursing notes.  Pertinent labs & imaging results that were available during my care of the patient were reviewed by me and considered in my medical decision making (see chart for details).    12:23 AM Patient in no distress, resting, sitting upright, no ongoing complaints, appears calm, speaking clearly. Reviewed findings exam, no treatments for pneumonia, no UA  evidence for UTI, labs reassuring, evidence for likely bacteremia, sepsis. Patient has had Covid test sent earlier today.  With concern for Covid, the patient instructed to self monitor, quarantine until results available tomorrow, but with otherwise reassuring findings here, patient is appropriate for discharge with close outpatient follow-up.     Final Clinical Impressions(s) / ED Diagnoses   Final diagnoses:  Fever in adult      Carmin Muskrat, MD 01/24/19 [phone]

## 2019-01-24 NOTE — Discharge Instructions (Signed)
As discussed, your evaluation today has been largely reassuring.  But, it is important that you monitor your condition carefully, and do not hesitate to return to the ED if you develop new, or concerning changes in your condition.  Your Covid test should be available tomorrow.  Until that time, please self quarantine.  Please follow-up with your physician for appropriate ongoing care.

## 2019-01-25 LAB — NOVEL CORONAVIRUS, NAA: SARS-CoV-2, NAA: DETECTED — AB

## 2019-01-29 DIAGNOSIS — G8929 Other chronic pain: Secondary | ICD-10-CM | POA: Diagnosis not present

## 2019-01-29 DIAGNOSIS — U071 COVID-19: Secondary | ICD-10-CM | POA: Diagnosis not present

## 2019-01-29 DIAGNOSIS — J069 Acute upper respiratory infection, unspecified: Secondary | ICD-10-CM | POA: Diagnosis not present

## 2019-01-29 DIAGNOSIS — M545 Low back pain: Secondary | ICD-10-CM | POA: Diagnosis not present

## 2019-02-20 DIAGNOSIS — G510 Bell's palsy: Secondary | ICD-10-CM | POA: Diagnosis not present

## 2019-02-21 DIAGNOSIS — Z20828 Contact with and (suspected) exposure to other viral communicable diseases: Secondary | ICD-10-CM | POA: Diagnosis not present

## 2019-02-27 DIAGNOSIS — I10 Essential (primary) hypertension: Secondary | ICD-10-CM | POA: Diagnosis not present

## 2019-02-27 DIAGNOSIS — M545 Low back pain: Secondary | ICD-10-CM | POA: Diagnosis not present

## 2019-02-27 DIAGNOSIS — Z79899 Other long term (current) drug therapy: Secondary | ICD-10-CM | POA: Diagnosis not present

## 2019-02-27 DIAGNOSIS — M961 Postlaminectomy syndrome, not elsewhere classified: Secondary | ICD-10-CM | POA: Diagnosis not present

## 2019-02-27 DIAGNOSIS — G8929 Other chronic pain: Secondary | ICD-10-CM | POA: Diagnosis not present

## 2019-02-27 DIAGNOSIS — E559 Vitamin D deficiency, unspecified: Secondary | ICD-10-CM | POA: Diagnosis not present

## 2019-04-01 IMAGING — CT CT HEAD W/O CM
4 series · 16 of 47 positions shown, 18 images · non-contrast
Comparison: Head CT 05/12/2018

CLINICAL DATA: Dizziness.  Vestibular schwannoma surgery 05/09/2018

EXAM:
CT HEAD WITHOUT CONTRAST
TECHNIQUE: Contiguous axial images were obtained from the base of the skull
through the vertex without intravenous contrast.

[Series 3: head bone · axial · 0.41mm/px · z∈[-69,-37]mm · 3 of 78 slices shown]
[im 8/78  bone]
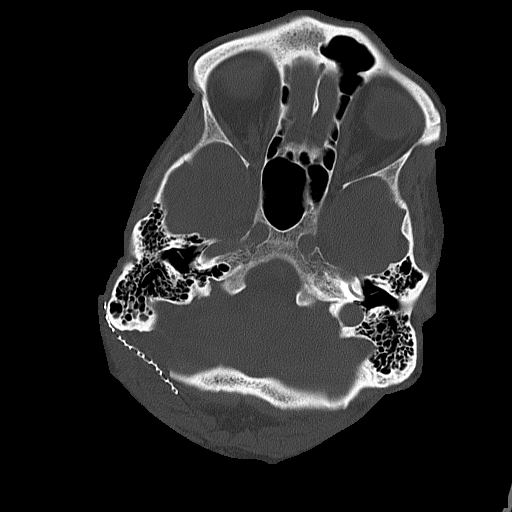
[im 16/78  bone]
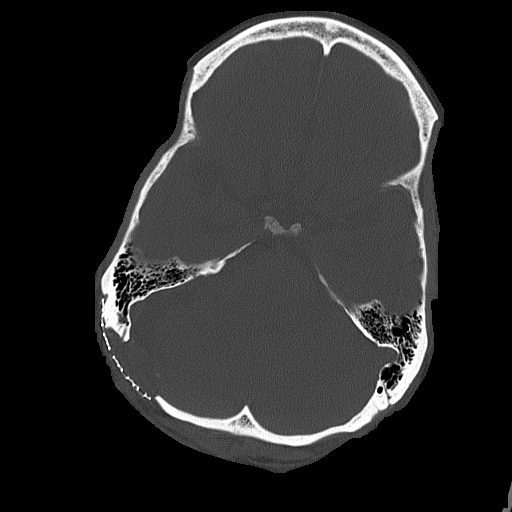
[im 24/78  bone]
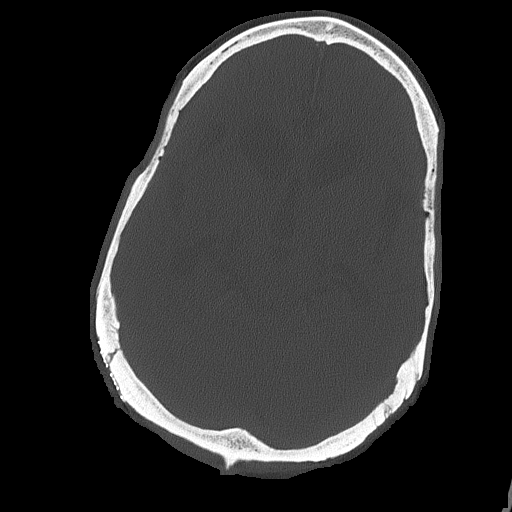

[Series 4: head without · axial · non-contrast · 0.41mm/px · z∈[-68,+47]mm · 7 of 31 slices shown, 9 images]
[im 4/31  brain]
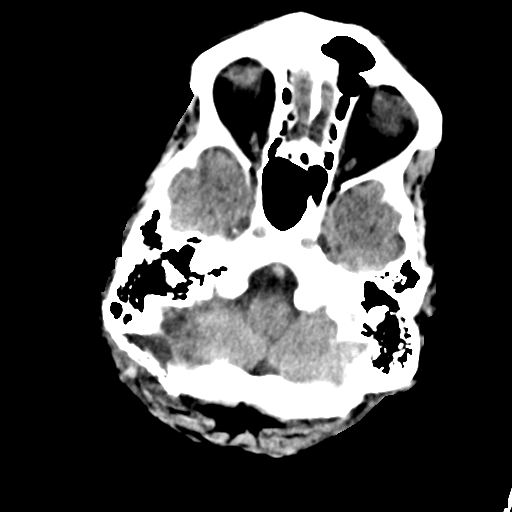
[im 4/31  bone]
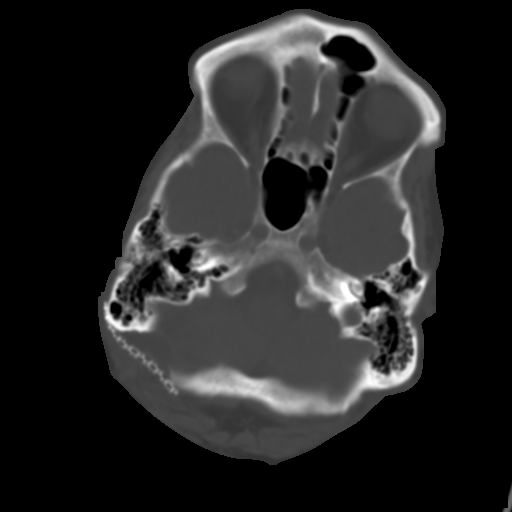
[im 8/31  brain]
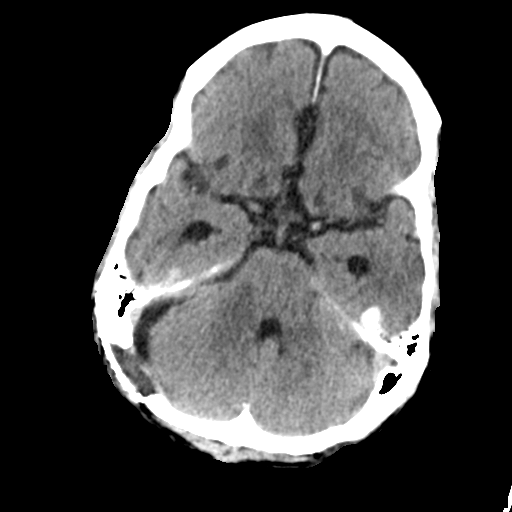
[im 12/31  brain]
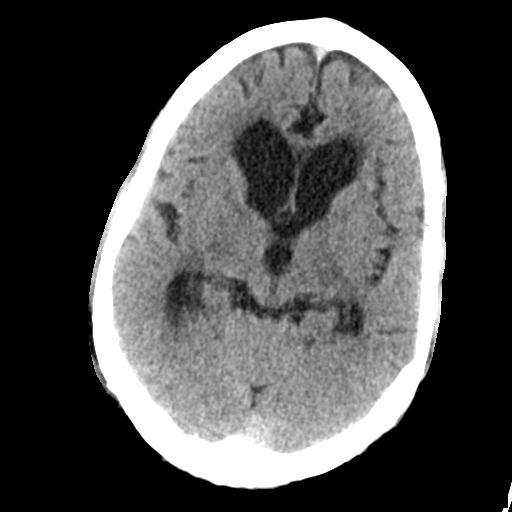
[im 16/31  brain]
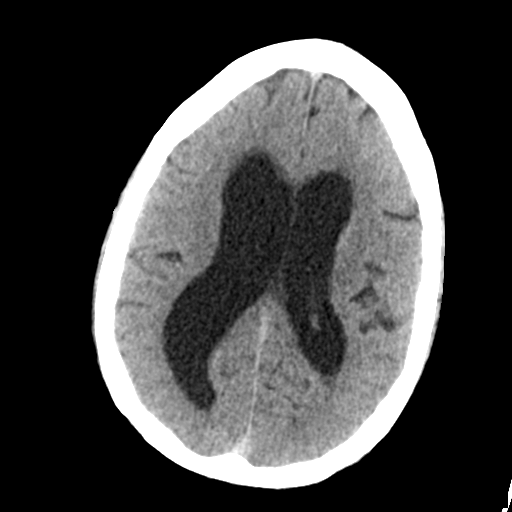
[im 19/31  brain]
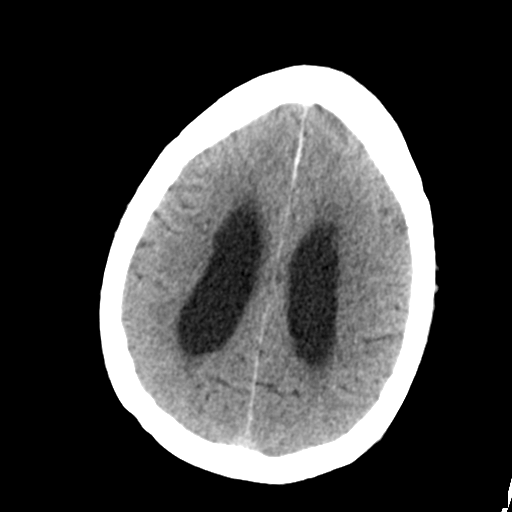
[im 19/31  bone]
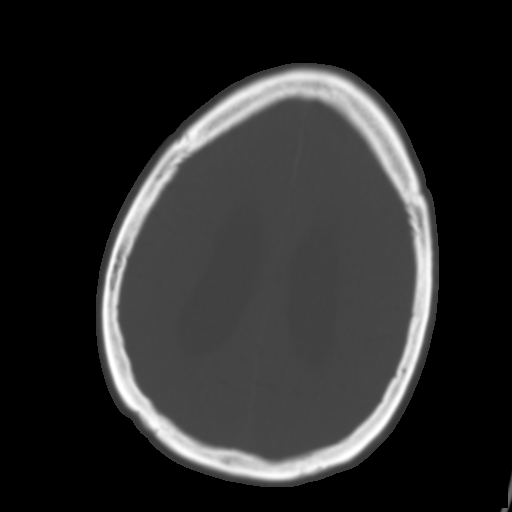
[im 23/31  brain]
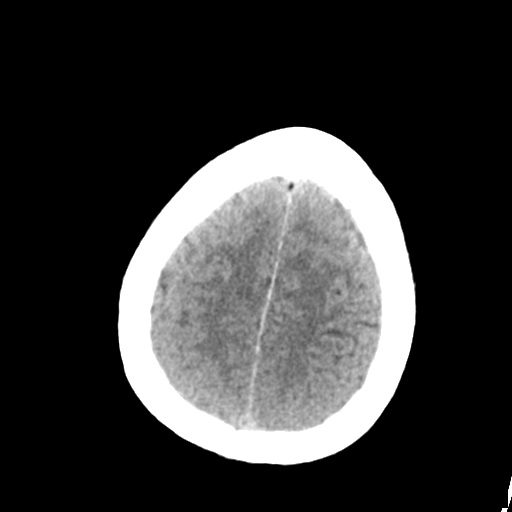
[im 27/31  brain]
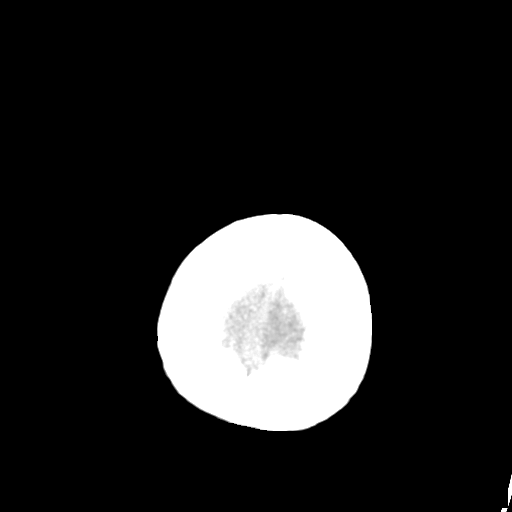

[Series 5: head without cor · coronal · non-contrast · 0.30mm/px · 3 of 67 slices shown]
[im 23/67  brain]
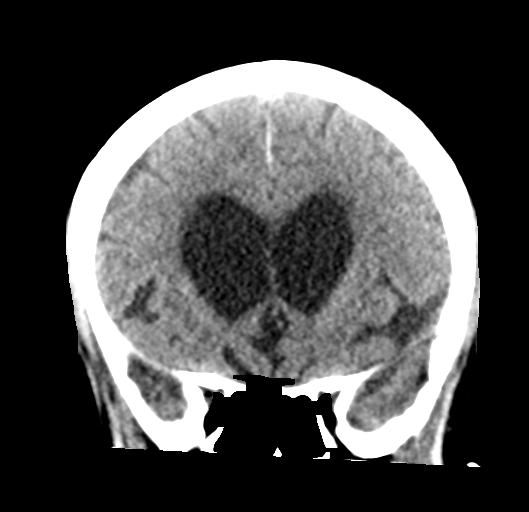
[im 30/67  brain]
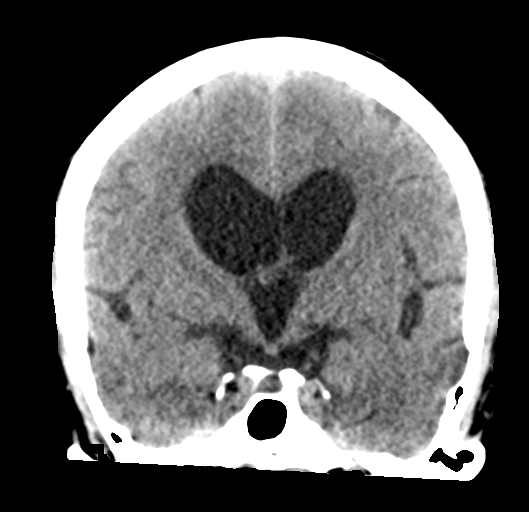
[im 37/67  brain]
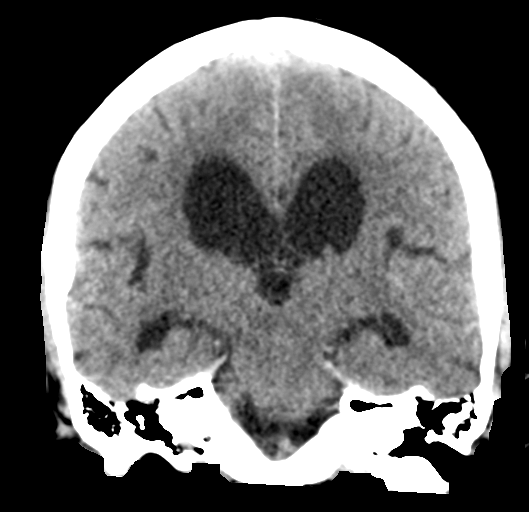

[Series 6: head without sag · sagittal · non-contrast · 0.30mm/px · 3 of 66 slices shown]
[im 22/66  brain]
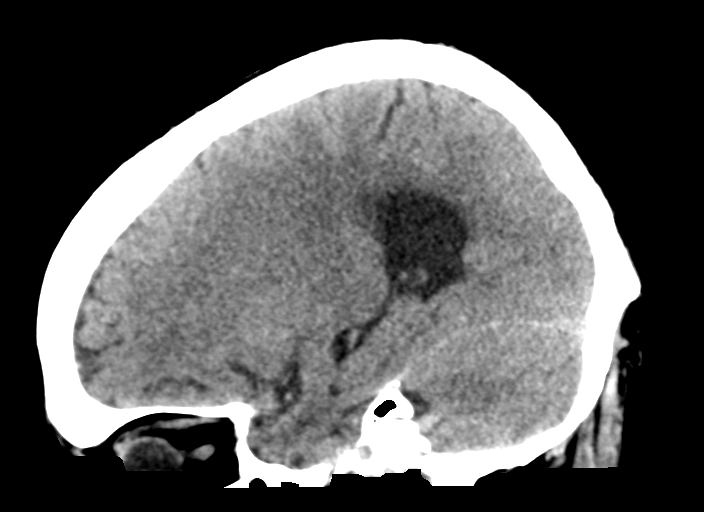
[im 33/66  brain]
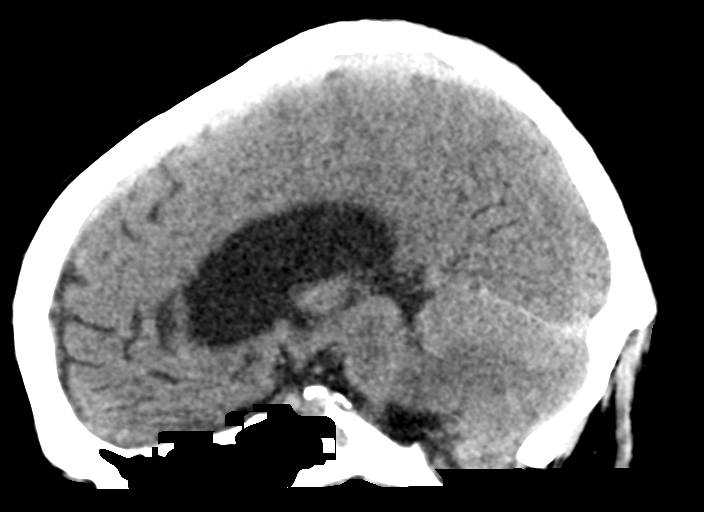
[im 44/66  brain]
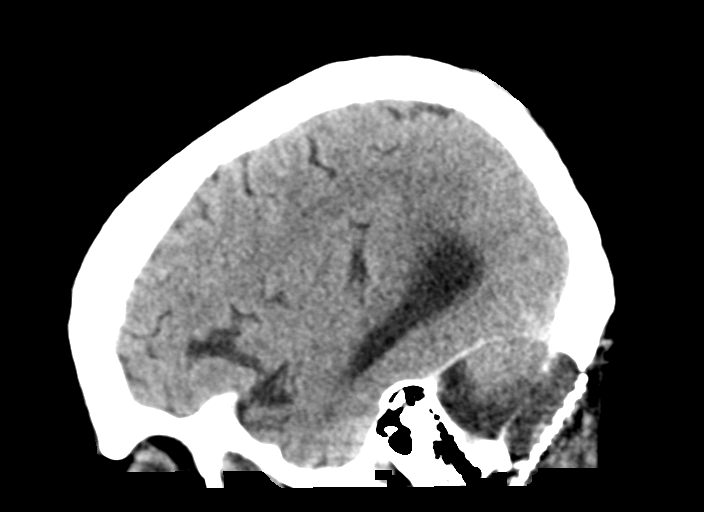

[16 of 47 positions shown; findings below may reference images not displayed]

FINDINGS: Brain: Decreasing high-density blood clot in the right posterior
fossa where there is small volume residual hygroma. Associated mass
effect on the brainstem and right cerebellum is diminished. There is
continued low-density appearance of the right brachium pontis.
Lateral and third ventriculomegaly out of proportion to fourth
ventricular size, but unchanged from preoperative CT, arguing
against postoperative obstructive hydrocephalus. The fourth
ventricle is also not particularly distorted by the collection.
Stable mild low-density around the lateral ventricles. No evidence
of acute infarct or acute hemorrhage.

Vascular: No hyperdense vessel.

Skull: Right retromastoid craniectomy with plating. No unexpected
finding

Sinuses/Orbits: No significant finding

Other: Dermal inclusion cyst in the right sided scalp.
IMPRESSION: 1. Decreasing postoperative blood products and hygroma in the right
CP angle cistern.
2. Lateral and third ventriculomegaly that is unchanged from
preoperative imaging.
3. Residual edema likely present in the right brachium pontis.

## 2019-12-05 ENCOUNTER — Other Ambulatory Visit: Payer: Self-pay | Admitting: Neurosurgery

## 2019-12-05 DIAGNOSIS — D333 Benign neoplasm of cranial nerves: Secondary | ICD-10-CM

## 2020-08-20 ENCOUNTER — Ambulatory Visit
Admission: RE | Admit: 2020-08-20 | Discharge: 2020-08-20 | Disposition: A | Discharge status: Home / Self Care | Payer: Medicare Other | Source: Ambulatory Visit | Attending: Neurosurgery | Admitting: Neurosurgery

## 2020-08-20 DIAGNOSIS — D333 Benign neoplasm of cranial nerves: Secondary | ICD-10-CM

## 2020-08-20 MED ORDER — GADOBENATE DIMEGLUMINE 529 MG/ML IV SOLN
15.0000 mL | Freq: Once | INTRAVENOUS | Status: AC | PRN
  Administered 2020-08-20: 15 mL via INTRAVENOUS

## 2021-09-29 ENCOUNTER — Ambulatory Visit: Payer: Medicare Other | Admitting: Registered Nurse

## 2021-12-07 ENCOUNTER — Emergency Department (HOSPITAL_COMMUNITY): Payer: Medicare Other

## 2021-12-07 ENCOUNTER — Inpatient Hospital Stay (HOSPITAL_COMMUNITY)
Admission: EM | Admit: 2021-12-07 | Discharge: 2021-12-10 | DRG: 177 | Disposition: A | Discharge status: Home / Self Care | Payer: Medicare Other | Attending: Internal Medicine | Admitting: Internal Medicine

## 2021-12-07 ENCOUNTER — Other Ambulatory Visit: Payer: Self-pay

## 2021-12-07 ENCOUNTER — Encounter (HOSPITAL_COMMUNITY): Payer: Self-pay | Admitting: Family Medicine

## 2021-12-07 DIAGNOSIS — I10 Essential (primary) hypertension: Secondary | ICD-10-CM | POA: Diagnosis present

## 2021-12-07 DIAGNOSIS — J850 Gangrene and necrosis of lung: Principal | ICD-10-CM | POA: Diagnosis present

## 2021-12-07 DIAGNOSIS — H9190 Unspecified hearing loss, unspecified ear: Secondary | ICD-10-CM | POA: Diagnosis present

## 2021-12-07 DIAGNOSIS — E875 Hyperkalemia: Secondary | ICD-10-CM | POA: Diagnosis present

## 2021-12-07 DIAGNOSIS — J690 Pneumonitis due to inhalation of food and vomit: Secondary | ICD-10-CM | POA: Diagnosis present

## 2021-12-07 DIAGNOSIS — G8929 Other chronic pain: Secondary | ICD-10-CM | POA: Diagnosis present

## 2021-12-07 DIAGNOSIS — N179 Acute kidney failure, unspecified: Secondary | ICD-10-CM | POA: Diagnosis not present

## 2021-12-07 DIAGNOSIS — Z79899 Other long term (current) drug therapy: Secondary | ICD-10-CM | POA: Diagnosis not present

## 2021-12-07 DIAGNOSIS — M25511 Pain in right shoulder: Secondary | ICD-10-CM | POA: Diagnosis not present

## 2021-12-07 DIAGNOSIS — Z9049 Acquired absence of other specified parts of digestive tract: Secondary | ICD-10-CM

## 2021-12-07 DIAGNOSIS — Z8249 Family history of ischemic heart disease and other diseases of the circulatory system: Secondary | ICD-10-CM

## 2021-12-07 DIAGNOSIS — R531 Weakness: Principal | ICD-10-CM

## 2021-12-07 LAB — CBC WITH DIFFERENTIAL/PLATELET
Abs Immature Granulocytes: 0.11 10*3/uL — ABNORMAL HIGH (ref 0.00–0.07)
Basophils Absolute: 0 10*3/uL (ref 0.0–0.1)
Basophils Relative: 1 %
Eosinophils Absolute: 0.4 10*3/uL (ref 0.0–0.5)
Eosinophils Relative: 4 %
HCT: 44.1 % (ref 39.0–52.0)
Hemoglobin: 15.1 g/dL (ref 13.0–17.0)
Immature Granulocytes: 1 %
Lymphocytes Relative: 15 %
Lymphs Abs: 1.3 10*3/uL (ref 0.7–4.0)
MCH: 29.9 pg (ref 26.0–34.0)
MCHC: 34.2 g/dL (ref 30.0–36.0)
MCV: 87.3 fL (ref 80.0–100.0)
Monocytes Absolute: 0.6 10*3/uL (ref 0.1–1.0)
Monocytes Relative: 7 %
Neutro Abs: 6.3 10*3/uL (ref 1.7–7.7)
Neutrophils Relative %: 72 %
Platelets: 199 10*3/uL (ref 150–400)
RBC: 5.05 MIL/uL (ref 4.22–5.81)
RDW: 13.2 % (ref 11.5–15.5)
WBC: 8.8 10*3/uL (ref 4.0–10.5)
nRBC: 0 % (ref 0.0–0.2)

## 2021-12-07 LAB — BASIC METABOLIC PANEL
Anion gap: 9 (ref 5–15)
BUN: 28 mg/dL — ABNORMAL HIGH (ref 8–23)
CO2: 26 mmol/L (ref 22–32)
Calcium: 9.4 mg/dL (ref 8.9–10.3)
Chloride: 100 mmol/L (ref 98–111)
Creatinine, Ser: 1.78 mg/dL — ABNORMAL HIGH (ref 0.61–1.24)
GFR, Estimated: 41 mL/min — ABNORMAL LOW (ref >60)
Glucose, Bld: 108 mg/dL — ABNORMAL HIGH (ref 70–99)
Potassium: 5.2 mmol/L — ABNORMAL HIGH (ref 3.5–5.1)
Sodium: 135 mmol/L (ref 135–145)

## 2021-12-07 LAB — CK: Total CK: 29 U/L — ABNORMAL LOW (ref 49–397)

## 2021-12-07 LAB — TROPONIN I (HIGH SENSITIVITY)
Troponin I (High Sensitivity): 12 ng/L (ref <18)
Troponin I (High Sensitivity): 11 ng/L (ref <18)

## 2021-12-07 LAB — BRAIN NATRIURETIC PEPTIDE: B Natriuretic Peptide: 35.1 pg/mL (ref 0.0–100.0)

## 2021-12-07 MED ORDER — ENOXAPARIN SODIUM 40 MG/0.4ML IJ SOSY
40.0000 mg | PREFILLED_SYRINGE | Freq: Every day | INTRAMUSCULAR | Status: DC
  Administered 2021-12-08 – 2021-12-09 (×3): 40 mg via SUBCUTANEOUS
  Filled 2021-12-07 (×3): qty 0.4

## 2021-12-07 MED ORDER — ONDANSETRON HCL 4 MG PO TABS: 4.0000 mg | ORAL_TABLET | Freq: Four times a day (QID) | ORAL | Status: DC | PRN

## 2021-12-07 MED ORDER — OXYCODONE HCL 5 MG PO TABS
5.0000 mg | ORAL_TABLET | Freq: Four times a day (QID) | ORAL | Status: DC | PRN
  Administered 2021-12-08 – 2021-12-10 (×10): 10 mg via ORAL
  Filled 2021-12-07 (×10): qty 2

## 2021-12-07 MED ORDER — PIPERACILLIN-TAZOBACTAM 3.375 G IVPB
3.3750 g | Freq: Once | INTRAVENOUS | Status: AC
  Administered 2021-12-07: 3.375 g via INTRAVENOUS
  Filled 2021-12-07: qty 50

## 2021-12-07 MED ORDER — ACETAMINOPHEN 650 MG RE SUPP: 650.0000 mg | Freq: Four times a day (QID) | RECTAL | Status: DC | PRN

## 2021-12-07 MED ORDER — ACETAMINOPHEN 325 MG PO TABS
650.0000 mg | ORAL_TABLET | Freq: Four times a day (QID) | ORAL | Status: DC | PRN
  Administered 2021-12-09: 650 mg via ORAL
  Filled 2021-12-07: qty 2

## 2021-12-07 MED ORDER — SENNOSIDES-DOCUSATE SODIUM 8.6-50 MG PO TABS
1.0000 | ORAL_TABLET | Freq: Every evening | ORAL | Status: DC | PRN
  Administered 2021-12-08 – 2021-12-09 (×2): 1 via ORAL
  Filled 2021-12-07 (×2): qty 1

## 2021-12-07 MED ORDER — LACTATED RINGERS IV BOLUS
500.0000 mL | Freq: Once | INTRAVENOUS | Status: AC
  Administered 2021-12-07: 500 mL via INTRAVENOUS

## 2021-12-07 MED ORDER — SODIUM CHLORIDE 0.9 % IV SOLN: INTRAVENOUS | Status: DC

## 2021-12-07 MED ORDER — METOPROLOL TARTRATE 50 MG PO TABS
100.0000 mg | ORAL_TABLET | Freq: Two times a day (BID) | ORAL | Status: DC
  Administered 2021-12-08 – 2021-12-10 (×6): 100 mg via ORAL
  Filled 2021-12-07: qty 4
  Filled 2021-12-07 (×4): qty 2
  Filled 2021-12-07: qty 4

## 2021-12-07 MED ORDER — SODIUM CHLORIDE 0.9% FLUSH
3.0000 mL | Freq: Two times a day (BID) | INTRAVENOUS | Status: DC
  Administered 2021-12-08 – 2021-12-10 (×5): 3 mL via INTRAVENOUS

## 2021-12-07 MED ORDER — ONDANSETRON HCL 4 MG/2ML IJ SOLN: 4.0000 mg | Freq: Four times a day (QID) | INTRAMUSCULAR | Status: DC | PRN

## 2021-12-07 MED ORDER — VANCOMYCIN HCL 1500 MG/300ML IV SOLN
1500.0000 mg | Freq: Once | INTRAVENOUS | Status: AC
  Administered 2021-12-07: 1500 mg via INTRAVENOUS
  Filled 2021-12-07: qty 300

## 2021-12-07 NOTE — H&P (Signed)
History and Physical    KHAYMAN KIRSCH SNK:[id number] DOB: 1950/12/21 DOA: 12/07/2021  PCP: Sandi Mariscal, MD   Patient coming from: Home   Chief Complaint: SOB, malaise   HPI: WADELL CRADDOCK is a pleasant 71 y.o. male with medical history significant for chronic shoulder pain, hypertension, and acoustic neuroma resection in 2020 who now presents with worsening shortness of breath and general malaise.  Patient had an acute illness roughly 4 weeks ago marked by severe nausea and vomiting for 3 days.  This was followed by shortness of breath and productive cough for which she was treated with antibiotics by his PCP.  He initially seemed to be improving with the antibiotics before beginning to worsen again over the past 2 weeks with general malaise and returning shortness of breath and cough.  Yesterday, his activities were severely limited by dyspnea and he developed diaphoresis and acute SOB while trying to perform some yard work.  ED Course: Upon arrival to the ED, patient is found to be afebrile and saturating well on room air with stable blood pressure.  Chest CT is concerning for multifocal necrotizing pneumonia.  Chemistry panel notable for potassium 5.2 and creatinine 1.78.  EKG demonstrates a repolarization abnormality which ED physician discussed with cardiology, and given the lack of chest pain or elevation in cardiac enzymes, as well as similar EKG changes seen previously, no further work-up of this was recommended.  Blood cultures were collected and the patient was given 500 mL of LR, vancomycin, and Zosyn.  Review of Systems:  All other systems reviewed and apart from HPI, are negative.  Past Medical History:  Diagnosis Date   Chronic pain in shoulder 2018   [place]   H/O echocardiogram 10/08/2016   EF 65-70%, normal wall motion, systolic function vigorous   Hearing loss    pending hearing aids 06/2014   Hypertension    Lipoma    Varicose vein    Vestibular  schwannoma ([place]) 04/2018    Past Surgical History:  Procedure Laterality Date   APPENDECTOMY     APPLICATION OF CRANIAL NAVIGATION Right 05/09/2018   Procedure: APPLICATION OF CRANIAL NAVIGATION;  Surgeon: Consuella Lose, MD;  Location: [facility];  Service: Neurosurgery;  Laterality: Right;   BIOPSY  04/12/2018   Procedure: BIOPSY;  Surgeon: Juanita Craver, MD;  Location[facility] ENDOSCOPY;  Service: Endoscopy;;   COLONOSCOPY     age 57, he did not f/u with referral 2018   COLONOSCOPY WITH PROPOFOL N/A 04/12/2018   Procedure: COLONOSCOPY WITH PROPOFOL;  Surgeon: Juanita Craver, MD;  Location: [facility] ENDOSCOPY;  Service: Endoscopy;  Laterality: N/A;   CRANIOTOMY Right 05/09/2018   Procedure: Right retrosigmoid craniectomy, resection of acoustic neuroma with intraoperative facial monitoring/Brain Lab;  Surgeon: Consuella Lose, MD;  Location: [facility];  Service: Neurosurgery;  Laterality: Right;   LUMBAR DISC SURGERY  2002   Dr. Rita Ohara   WISDOM TOOTH EXTRACTION      Social History:   reports that he has never smoked. He has never used smokeless tobacco. He reports that he does not currently use alcohol after a past usage of about 32.0 standard drinks of alcohol per week. He reports that he does not use drugs.  No Known Allergies  Family History  Problem Relation Age of Onset   Hypertension Mother    Dementia Father    Parkinsonism Father    Diabetes Father        early stages   Cancer Maternal Grandmother  breast   Heart disease Neg Hx    Stroke Neg Hx      Prior to Admission medications   Medication Sig Start Date End Date Taking? Authorizing Provider  acetaminophen (TYLENOL 8 HOUR) 650 MG CR tablet Take 1 tablet (650 mg total) by mouth every 8 (eight) hours as needed for pain. 04/18/18   Tysinger, Camelia Eng, PA-C  lisinopril (PRINIVIL,ZESTRIL) 20 MG tablet Take 1 tablet (20 mg total) by mouth daily. 10/06/17   Tysinger, Camelia Eng, PA-C  magnesium oxide (MAG-OX) 400 MG tablet Take 400 mg  by mouth 2 (two) times daily.    [provider]  metoprolol tartrate (LOPRESSOR) 100 MG tablet Take 1 tablet (100 mg total) by mouth 2 (two) times daily. 10/06/17   Tysinger, Camelia Eng, PA-C  Multiple Vitamin (MULITIVITAMIN WITH MINERALS) TABS Take 1 tablet by mouth daily.    [provider]  Oxycodone HCl 10 MG TABS Take 5-10 mg by mouth every 6 (six) hours as needed (Pain).    [provider]    Physical Exam: Vitals:   12/07/21 2124 12/07/21 2145 12/07/21 2300 12/08/21 0001  BP: (!) 162/98 (!) 140/103    Pulse: (!) 58 61  70  Resp: 12 16    Temp: 98.2 F (36.8 C)     TempSrc: Oral     SpO2: 100% 100%    Weight:   74.8 kg   Height:   '5\' 7"'$  (1.702 m)     Constitutional: NAD, calm  Eyes: PERTLA, lids and conjunctivae normal ENMT: Mucous membranes are moist. Posterior pharynx clear of any exudate or lesions.   Neck: supple, no masses  Respiratory: coarse rales on right, no wheezing. No accessory muscle use.  Cardiovascular: S1 & S2 heard, regular rate and rhythm. No extremity edema.   Abdomen: No distension, no tenderness, soft. Bowel sounds active.  Musculoskeletal: no clubbing / cyanosis. No joint deformity upper and lower extremities.   Skin: no significant rashes, lesions, ulcers. Warm, dry, well-perfused. Neurologic: No dysarthria or aphasia. Moving all extremities. Alert and oriented.  Psychiatric: Pleasant. Cooperative.    Labs and Imaging on Admission: I have personally reviewed following labs and imaging studies  CBC: Recent Labs  Lab 12/07/21 1721  WBC 8.8  NEUTROABS 6.3  HGB 15.1  HCT 44.1  MCV 87.3  PLT 062   Basic Metabolic Panel: Recent Labs  Lab 12/07/21 1721  NA 135  K 5.2*  CL 100  CO2 26  GLUCOSE 108*  BUN 28*  CREATININE 1.78*  CALCIUM 9.4   GFR: Estimated Creatinine Clearance: 36.1 mL/min (A) (by C-G formula based on SCr of 1.78 mg/dL (H)). Liver Function Tests: No results for input(s): "AST", "ALT", "ALKPHOS",  "BILITOT", "PROT", "ALBUMIN" in the last 168 hours. No results for input(s): "LIPASE", "AMYLASE" in the last 168 hours. No results for input(s): "AMMONIA" in the last 168 hours. Coagulation Profile: No results for input(s): "INR", "PROTIME" in the last 168 hours. Cardiac Enzymes: Recent Labs  Lab 12/07/21 2149  CKTOTAL 29*   BNP (last 3 results) No results for input(s): "PROBNP" in the last 8760 hours. HbA1C: No results for input(s): "HGBA1C" in the last 72 hours. CBG: No results for input(s): "GLUCAP" in the last 168 hours. Lipid Profile: No results for input(s): "CHOL", "HDL", "LDLCALC", "TRIG", "CHOLHDL", "LDLDIRECT" in the last 72 hours. Thyroid Function Tests: No results for input(s): "TSH", "T4TOTAL", "FREET4", "T3FREE", "THYROIDAB" in the last 72 hours. Anemia Panel: No results for input(s): "VITAMINB12", "  FOLATE", "FERRITIN", "TIBC", "IRON", "RETICCTPCT" in the last 72 hours. Urine analysis:    Component Value Date/Time   LABSPEC 1.020 10/05/2017 0956   BILIRUBINUR negative 10/05/2017 0956   BILIRUBINUR NEG 07/03/2013 1015   KETONESUR negative 10/05/2017 0956   PROTEINUR negative 10/05/2017 0956   PROTEINUR NEG 07/03/2013 1015   UROBILINOGEN negative 07/03/2013 1015   NITRITE Negative 10/05/2017 0956   NITRITE NEG 07/03/2013 1015   LEUKOCYTESUR Trace (A) 10/05/2017 0956   Sepsis Labs: '@LABRCNTIP'$ (procalcitonin:4,lacticidven:4) )No results found for this or any previous visit (from the past 240 hour(s)).   Radiological Exams on Admission: CT Chest Wo Contrast  Result Date: 12/07/2021 CLINICAL DATA:  Pneumonia, complication suspected, abnormal x-ray. EXAM: CT CHEST WITHOUT CONTRAST TECHNIQUE: Multidetector CT imaging of the chest was performed following the standard protocol without IV contrast. RADIATION DOSE REDUCTION: This exam was performed according to the departmental dose-optimization program which includes automated exposure control, adjustment of the mA  and/or kV according to patient size and/or use of iterative reconstruction technique. COMPARISON:  None Available. FINDINGS: Cardiovascular: Mild coronary artery calcification. Global cardiac size within normal limits. No pericardial effusion. Central pulmonary arteries are of normal caliber. Minimal atherosclerotic calcification within the thoracic aorta. No aortic aneurysm. Mediastinum/Nodes: Visualized thyroid is unremarkable. Shotty right paratracheal adenopathy may be reactive in nature. No frankly pathologic thoracic adenopathy identified. The esophagus is unremarkable. Lungs/Pleura: There is centrilobular nodular and ground-glass pulmonary infiltrate within the left mid lung zone and lung bases bilaterally with more focal consolidation within the basilar right middle lobe and lateral and posterobasal right lower lobe. There is superimposed areas of cavitation within the areas of consolidation within the right middle lobe and posterior segment of the right lower lobe. Altogether, the findings are in keeping with multifocal necrotizing pneumonia. No central obstructing lesion; the central airways are widely patent. No pneumothorax or pleural effusion. Upper Abdomen: No acute abnormality. Musculoskeletal: No chest wall mass or suspicious bone lesions identified. IMPRESSION: 1. Multifocal necrotizing pneumonia, most severe within the right middle lobe and right lower lobe. 2. Mild coronary artery calcification. Electronically Signed   By: Fidela Salisbury M.D.   On: 12/07/2021 22:15   DG Chest 2 View  Result Date: 12/07/2021 CLINICAL DATA:  Shortness of breath EXAM: CHEST - 2 VIEW COMPARISON:  None Available. FINDINGS: The heart size and mediastinal contours are within normal limits. Consolidation of the right middle lobe with associated lucency on lateral view. Probable additional more mild consolidations of the bilateral lower lobes. No pleural effusion or pneumothorax. IMPRESSION: 1. Right middle lobe  consolidation with associated lucency which is concerning for cavitary pneumonia. Recommend further evaluation with contrast-enhanced chest CT. 2. Probable additional more mild consolidations of the bilateral lower lobes, concerning for multifocal infection. Electronically Signed   By: Yetta Glassman M.D.   On: 12/07/2021 17:53    EKG: Independently reviewed. Sinus rhythm, repolarization abnormality.   Assessment/Plan  1. Necrotizing pneumonia  - Presents with worsening SOB and malaise and found on CT to have necrotizing pneumonia  - Likely etiology is aspiration given 3 days of severe N/V that preceded his respiratory sxs  - Blood cultures collected in ED and antibiotics were started  - Culture sputum, treat with Unasyn, follow cultures and clinical response to treatment   2. AKI; hyperkalemia  - SCr is 1.78, up from 1.20 three years ago; potassium is 5.2  - Suspect increased SCr is acute in setting of acute illness  - Continue IVF hydration, hold lisinopril, renally-dose  medication, repeat serum chemistries in am    3. Hypertension  - Continue metoprolol, hold lisinopril in light of AKI & hyperkalemia    4. Chronic pain  - Prescription database reviewed, will continue home regimen    DVT prophylaxis: Lovenox  Code Status: Full  Level of Care: Level of care: Telemetry Medical Family Communication: None present  Disposition Plan:  Patient is from: home  Anticipated d/c is to: Home  Anticipated d/c date is: 12/10/21  Patient currently: Pending improvement in pneumonia, transition to oral medications  Consults called: none  Admission status: Inpatient     Vianne Bulls, MD [facility]  12/08/2021, 12:41 AM

## 2021-12-07 NOTE — ED Provider Triage Note (Signed)
Emergency Medicine Provider Triage Evaluation Note  Logan Phillips , a 71 y.o. male  was evaluated in triage.  Pt complains of progressive exertional fatigue and diaphoresis.  PCP today for evaluation noted to have inverted T waves in some ST changes sent here for further evaluation.  No chest pain.  Does get very fatigued and weak with minimal activities.  Review of Systems  Positive: Fatigue, ekg changes Negative:   Physical Exam  There were no vitals taken for this visit. Gen:   Awake, no distress   Resp:  Normal effort  MSK:   Moves extremities without difficulty  Other:    Medical Decision Making  Medically screening exam initiated at 4:55 PM.  Appropriate orders placed.  [name] was informed that the remainder of the evaluation will be completed by another provider, this initial triage assessment does not replace that evaluation, and the importance of remaining in the ED until their evaluation is complete.  Abnormal ekg   Hollyann Pablo A, PA-C 12/07/21 1656

## 2021-12-07 NOTE — ED Provider Notes (Signed)
[facility] EMERGENCY DEPARTMENT Provider Note   CSN: [id number] Arrival date & time: 12/07/21  1638     History  Chief Complaint  Patient presents with   Weakness    Logan Phillips is a 71 y.o. male.   Weakness  Per the patient, the patient had an episode yesterday when doing yard work that he felt extremely short of breath and was having significant diaphoresis.  He endorses that he did not have any chest pain, palpitations, syncope or presyncope during the episode however he felt extremely unwell.  At that point time he deferred going to the emergency department instead decided to follow-up with his primary care doctor today who did an EKG and was concerned therefore sent to the emergency For additional evaluation.   Patient states that over the past few weeks, he has felt generally weak and having progressive worsening shortness of breath.  Patient states he has had difficulty ambulating secondary to his weakness that he can only take a few steps without stopping.  Patient states that he is typically very active he does not have any difficulty typically.  Of note, patient was recently treated for pneumonia approximately 4 weeks ago.  He denies any fever, chills, night sweats, cough, sputum production, dysuria or any other concerning symptoms.     Home Medications Prior to Admission medications   Medication Sig Start Date End Date Taking? Authorizing Provider  acetaminophen (TYLENOL 8 HOUR) 650 MG CR tablet Take 1 tablet (650 mg total) by mouth every 8 (eight) hours as needed for pain. 04/18/18   Tysinger, Camelia Eng, PA-C  lisinopril (PRINIVIL,ZESTRIL) 20 MG tablet Take 1 tablet (20 mg total) by mouth daily. 10/06/17   Tysinger, Camelia Eng, PA-C  magnesium oxide (MAG-OX) 400 MG tablet Take 400 mg by mouth 2 (two) times daily.    [provider]  metoprolol tartrate (LOPRESSOR) 100 MG tablet Take 1 tablet (100 mg total) by mouth 2 (two) times daily. 10/06/17    Tysinger, Camelia Eng, PA-C  Multiple Vitamin (MULITIVITAMIN WITH MINERALS) TABS Take 1 tablet by mouth daily.    [provider]  Oxycodone HCl 10 MG TABS Take 5-10 mg by mouth every 6 (six) hours as needed (Pain).    [provider]      Allergies    Patient has no known allergies.    Review of Systems   Review of Systems  Neurological:  Positive for weakness.    Physical Exam Updated Vital Signs BP (!) 130/95 (BP Location: Left Arm)   Pulse 60   Temp 98 F (36.7 C) (Oral)   Resp 17   SpO2 99%  Physical Exam Vitals and nursing note reviewed.  Constitutional:      General: He is not in acute distress.    Appearance: Normal appearance. He is well-developed.  HENT:     Head: Normocephalic and atraumatic.  Eyes:     Conjunctiva/sclera: Conjunctivae normal.  Cardiovascular:     Rate and Rhythm: Normal rate and regular rhythm.     Heart sounds: No murmur heard. Pulmonary:     Effort: Pulmonary effort is normal. No respiratory distress.     Breath sounds: Rales present.     Comments: Bilateral rales Abdominal:     Palpations: Abdomen is soft.     Tenderness: There is no abdominal tenderness.  Musculoskeletal:        General: No swelling.     Cervical back: Neck supple.  Skin:  General: Skin is warm and dry.     Capillary Refill: Capillary refill takes less than 2 seconds.  Neurological:     General: No focal deficit present.     Mental Status: He is alert and oriented to person, place, and time.  Psychiatric:        Mood and Affect: Mood normal.     ED Results / Procedures / Treatments   Labs (all labs ordered are listed, but only abnormal results are displayed) Labs Reviewed  CBC WITH DIFFERENTIAL/PLATELET - Abnormal; Notable for the following components:      Result Value   Abs Immature Granulocytes 0.11 (*)    All other components within normal limits  BASIC METABOLIC PANEL - Abnormal; Notable for the following components:   Potassium 5.2  (*)    Glucose, Bld 108 (*)    BUN 28 (*)    Creatinine, Ser 1.78 (*)    GFR, Estimated 41 (*)    All other components within normal limits  TROPONIN I (HIGH SENSITIVITY)  TROPONIN I (HIGH SENSITIVITY)    EKG None  Radiology CT Chest Wo Contrast  Result Date: 12/07/2021 CLINICAL DATA:  Pneumonia, complication suspected, abnormal x-ray. EXAM: CT CHEST WITHOUT CONTRAST TECHNIQUE: Multidetector CT imaging of the chest was performed following the standard protocol without IV contrast. RADIATION DOSE REDUCTION: This exam was performed according to the departmental dose-optimization program which includes automated exposure control, adjustment of the mA and/or kV according to patient size and/or use of iterative reconstruction technique. COMPARISON:  None Available. FINDINGS: Cardiovascular: Mild coronary artery calcification. Global cardiac size within normal limits. No pericardial effusion. Central pulmonary arteries are of normal caliber. Minimal atherosclerotic calcification within the thoracic aorta. No aortic aneurysm. Mediastinum/Nodes: Visualized thyroid is unremarkable. Shotty right paratracheal adenopathy may be reactive in nature. No frankly pathologic thoracic adenopathy identified. The esophagus is unremarkable. Lungs/Pleura: There is centrilobular nodular and ground-glass pulmonary infiltrate within the left mid lung zone and lung bases bilaterally with more focal consolidation within the basilar right middle lobe and lateral and posterobasal right lower lobe. There is superimposed areas of cavitation within the areas of consolidation within the right middle lobe and posterior segment of the right lower lobe. Altogether, the findings are in keeping with multifocal necrotizing pneumonia. No central obstructing lesion; the central airways are widely patent. No pneumothorax or pleural effusion. Upper Abdomen: No acute abnormality. Musculoskeletal: No chest wall mass or suspicious bone lesions  identified. IMPRESSION: 1. Multifocal necrotizing pneumonia, most severe within the right middle lobe and right lower lobe. 2. Mild coronary artery calcification. Electronically Signed   By: Fidela Salisbury M.D.   On: 12/07/2021 22:15   DG Chest 2 View  Result Date: 12/07/2021 CLINICAL DATA:  Shortness of breath EXAM: CHEST - 2 VIEW COMPARISON:  None Available. FINDINGS: The heart size and mediastinal contours are within normal limits. Consolidation of the right middle lobe with associated lucency on lateral view. Probable additional more mild consolidations of the bilateral lower lobes. No pleural effusion or pneumothorax. IMPRESSION: 1. Right middle lobe consolidation with associated lucency which is concerning for cavitary pneumonia. Recommend further evaluation with contrast-enhanced chest CT. 2. Probable additional more mild consolidations of the bilateral lower lobes, concerning for multifocal infection. Electronically Signed   By: Yetta Glassman M.D.   On: 12/07/2021 17:53    Procedures Procedures    Medications Ordered in ED Medications - No data to display  ED Course/ Medical Decision Making/ A&P  Medical Decision Making Amount and/or Complexity of Data Reviewed Labs: ordered. Radiology: ordered.  Risk Prescription drug management. Decision regarding hospitalization.   Medical Decision Making  This patient is Presenting for Evaluation of generalized weakness, which does require a range of treatment options, and is a complaint that involves a high risk of morbidity and mortality.  Arrived in ED by: POV  History obtained from: The patient  Limitations in history: none    At this time I am most concerned for STEMI. Also considering NSTEMI, pneumonia, pulmonary edema, pleural effusion, metabolic abnormality, hematologic abnormality, underlying malignancy. Plan for laboratory and imaging study work-up   I interpreted the ECG. It reveals a sinus  rhythm. The QTc, PR, and QRS are appropriate. There are no signs of acute ischemia or of significant electrical abnormalities. The ECG does not show a STEMI. There are no ST depressions.  Significant T wave inversions noted in the nearly all leads there is no evidence of a High-Grade Conduction Block.   Laboratory work-up significant for:  -CBC unremarkable -CMP reveals evidence of an AKI with creatinine 1.78, with evidence of associated electrolyte abnormalities, potassium 5.2 -Initial troponin 12, delta 0 -CK 29   Radiologic work-up was significant for: -Checks x-ray concerning for necrotizing pneumonia.  CT chest confirms findings revealing multifocal exercise pneumonia.   Interventions and Interval History: -Patient is generally well-appearing on initial exam, patient is imaging study findings are concerning for that of multifocal necrotizing pneumonia.  Patient denies any recent travel, smoking history, TB exposure.  Patient started on empiric antibiotic therapy with vancomycin and Zosyn. -Patient had evidence of diffuse T wave inversion noted on EKG.  Repeat EKG obtained which revealed mild improvement in the severity of the T wave inversions however diffuse T wave inversion was still present.  Cardiology engaged, noted that the T wave inversions were present in 2015, and stated that if his troponins were negative cardiac etiology is clinically unlikely at this time.     Additional documents reviewed: Outside hospital records    Disposition: Due to the patients current presenting symptoms, physical exam findings, and the workup stated above, it is thought that the etiology of the patients current presentation is necrotizing pneumonia   ADMIT: Patient is thought to require admission for IV antibiotic therapy, further investigation into the etiology. Patient will be admitted to hospitalist service. Please see in patient provider note for additional treatment plan details.   The plan  for this patient was discussed with Dr. Laverta Baltimore, who voiced agreement and who oversaw evaluation and treatment of this patient.     Clinical Complexity  A medically appropriate history, review of systems, and physical exam was performed.   I personally reviewed the lab and imaging studies discussed above.   MDM generated using voice dictation software and may contain dictation errors. Please contact me for any clarification or with any questions.             Final Clinical Impression(s) / ED Diagnoses Final diagnoses:  None    Rx / DC Orders ED Discharge Orders     None         Mang Hazelrigg, [place], MD 12/07/21 4967    Margette Fast, MD 12/08/21 1052

## 2021-12-07 NOTE — ED Triage Notes (Signed)
Pt reports two weeks of general malaise. Went to PCP who advised him to come to the ED. Denies chest pain.  EKG at PCP office sinus brady.

## 2021-12-08 DIAGNOSIS — J850 Gangrene and necrosis of lung: Secondary | ICD-10-CM | POA: Diagnosis not present

## 2021-12-08 LAB — BASIC METABOLIC PANEL
Anion gap: 9 (ref 5–15)
BUN: 27 mg/dL — ABNORMAL HIGH (ref 8–23)
CO2: 23 mmol/L (ref 22–32)
Calcium: 8.4 mg/dL — ABNORMAL LOW (ref 8.9–10.3)
Chloride: 103 mmol/L (ref 98–111)
Creatinine, Ser: 1.56 mg/dL — ABNORMAL HIGH (ref 0.61–1.24)
GFR, Estimated: 47 mL/min — ABNORMAL LOW (ref >60)
Glucose, Bld: 114 mg/dL — ABNORMAL HIGH (ref 70–99)
Potassium: 4.1 mmol/L (ref 3.5–5.1)
Sodium: 135 mmol/L (ref 135–145)

## 2021-12-08 LAB — CBC
HCT: 36.3 % — ABNORMAL LOW (ref 39.0–52.0)
Hemoglobin: 12.7 g/dL — ABNORMAL LOW (ref 13.0–17.0)
MCH: 30.5 pg (ref 26.0–34.0)
MCHC: 35 g/dL (ref 30.0–36.0)
MCV: 87.3 fL (ref 80.0–100.0)
Platelets: 148 10*3/uL — ABNORMAL LOW (ref 150–400)
RBC: 4.16 MIL/uL — ABNORMAL LOW (ref 4.22–5.81)
RDW: 13.2 % (ref 11.5–15.5)
WBC: 7.8 10*3/uL (ref 4.0–10.5)
nRBC: 0 % (ref 0.0–0.2)

## 2021-12-08 LAB — MRSA NEXT GEN BY PCR, NASAL: MRSA by PCR Next Gen: NOT DETECTED

## 2021-12-08 LAB — MAGNESIUM: Magnesium: 2.2 mg/dL (ref 1.7–2.4)

## 2021-12-08 LAB — HIV ANTIBODY (ROUTINE TESTING W REFLEX): HIV Screen 4th Generation wRfx: NONREACTIVE

## 2021-12-08 MED ORDER — SODIUM CHLORIDE 0.9 % IV SOLN: INTRAVENOUS | Status: DC

## 2021-12-08 MED ORDER — SODIUM CHLORIDE 0.9 % IV SOLN
3.0000 g | Freq: Three times a day (TID) | INTRAVENOUS | Status: AC
  Administered 2021-12-08 – 2021-12-09 (×6): 3 g via INTRAVENOUS
  Filled 2021-12-08 (×6): qty 8

## 2021-12-08 MED ORDER — IPRATROPIUM-ALBUTEROL 0.5-2.5 (3) MG/3ML IN SOLN: 3.0000 mL | RESPIRATORY_TRACT | Status: DC | PRN

## 2021-12-08 NOTE — ED Notes (Signed)
Ambulated in hallway o2 sat on RA 95-97% HR of 66. Pt denies dizziness , states having slight SHOB states feeling fine.

## 2021-12-08 NOTE — Progress Notes (Signed)
Pharmacy Antibiotic Note  Logan Phillips is a 71 y.o. male admitted on 12/07/2021 with pneumonia.  Pharmacy has been consulted for Unasyn dosing. WBC WNL. Noted renal dysfunction. MRSA PCR negative.   Plan: Unasyn 3g IV q8h Trend WBC, temp, renal function  F/U infectious work-up  Height: '5\' 7"'$  (170.2 cm) Weight: 74.8 kg (164 lb 14.5 oz) IBW/kg (Calculated) : 66.1  Temp (24hrs), Avg:98 F (36.7 C), Min:97.7 F (36.5 C), Max:98.2 F (36.8 C)  Recent Labs  Lab 12/07/21 1721 12/08/21 0155  WBC 8.8 7.8  CREATININE 1.78* 1.56*    Estimated Creatinine Clearance: 41.2 mL/min (A) (by C-G formula based on SCr of 1.56 mg/dL (H)).    No Known Allergies  Narda Bonds, PharmD, BCPS Clinical Pharmacist Phone: [phone]

## 2021-12-08 NOTE — Progress Notes (Addendum)
[facility] PROGRESS NOTE  AMITAI DELAUGHTER NUU:[id number] DOB: [birth year] DOA: 12/07/2021 PCP: Sandi Mariscal, MD  Status: Remains inpatient appropriate because: Necrotizing pneumonia requiring IV antibiotics  Barriers to discharge: Social: None  Clinical: Needs IV antibiotics due to necrotizing pneumonia and further monitoring regarding borderline hypoxemia  Level of care:  Telemetry Medical   Code Status: Full Family Communication: Patient only DVT prophylaxis: Lovenox  HPI: HPI:  (Per Dr. Criss Rosales note) [name] is a pleasant 71 y.o. male with medical history significant for chronic shoulder pain, hypertension, and acoustic neuroma resection in 2020 who now presents with worsening shortness of breath and general malaise.   Patient had an acute illness roughly 4 weeks ago marked by severe nausea and vomiting for 3 days.  This was followed by shortness of breath and productive cough for which she was treated with antibiotics by his PCP.  He initially seemed to be improving with the antibiotics before beginning to worsen again over the past 2 weeks with general malaise and returning shortness of breath and cough.  Yesterday, his activities were severely limited by dyspnea and he developed diaphoresis and acute SOB while trying to perform some yard work.   ED Course: Upon arrival to the ED, patient is found to be afebrile and saturating well on room air with stable blood pressure.  Chest CT is concerning for multifocal necrotizing pneumonia.  Chemistry panel notable for potassium 5.2 and creatinine 1.78.   EKG demonstrates a repolarization abnormality which ED physician discussed with cardiology, and given the lack of chest pain or elevation in cardiac enzymes, as well as similar EKG changes seen previously, no further work-up of this was recommended.   Blood cultures were collected and the patient was given 500 mL of LR, vancomycin, and Zosyn.  Subjective: Patient denies any  chest discomfort or any overt shortness of breath at rest.  He states he becomes fatigued and occasionally short of breath with exertion.  Reports wheezing with exertion.  Objective: Vitals:   12/08/21 0900 12/08/21 0918  BP: 126/78   Pulse: (!) 58 72  Resp: 17   Temp:    SpO2: 99%     Intake/Output Summary (Last 24 hours) at 12/08/2021 1052 Last data filed at 12/08/2021 0805 Gross per 24 hour  Intake 1618.47 ml  Output --  Net 1618.47 ml   Filed Weights   12/07/21 2300  Weight: 74.8 kg   Exam: Constitutional: NAD, calm, comfortable Respiratory: Coarse to auscultation bilaterally, more so on the right and diminished from mid fields down to the bases.  Normal respiratory effort. No accessory muscle use.  Room air Cardiovascular: Regular rate and rhythm, no murmurs / rubs / gallops. No extremity edema. 2+ pedal pulses.  Abdomen: no tenderness, no masses palpated.  Bowel sounds positive.  Musculoskeletal: no clubbing / cyanosis. No joint deformity upper and lower extremities. Good ROM, no contractures. Normal muscle tone.  Skin: no rashes, lesions, ulcers. No induration Neurologic: CN 2-12 grossly intact. Sensation intact, Strength 5/5 x all 4 extremities.  Psychiatric: Normal judgment and insight. Alert and oriented x 3. Normal mood.    Assessment/Plan: Acute problems: Right-sided necrotizing pneumonia suspected due to aspiration after recent nausea and vomiting Continue empiric antibiotics/Unasyn Ambulatory O2 sats on room air 95 to 97% Duo nebs for wheezing Legionella urinary antigen and strep urinary antigen pending collection Continue IV fluid  Hypertension Continue preadmission metoprolol  History of acoustic neuroma Resected in 2020  Chronic right shoulder pain Continue preadmission  oxycodone    Data Reviewed: Basic Metabolic Panel: Recent Labs  Lab 12/07/21 1721 12/08/21 0155  NA 135 135  K 5.2* 4.1  CL 100 103  CO2 26 23  GLUCOSE 108* 114*  BUN 28*  27*  CREATININE 1.78* 1.56*  CALCIUM 9.4 8.4*  MG  --  2.2   Liver Function Tests: No results for input(s): "AST", "ALT", "ALKPHOS", "BILITOT", "PROT", "ALBUMIN" in the last 168 hours. No results for input(s): "LIPASE", "AMYLASE" in the last 168 hours. No results for input(s): "AMMONIA" in the last 168 hours. CBC: Recent Labs  Lab 12/07/21 1721 12/08/21 0155  WBC 8.8 7.8  NEUTROABS 6.3  --   HGB 15.1 12.7*  HCT 44.1 36.3*  MCV 87.3 87.3  PLT 199 148*   Cardiac Enzymes: Recent Labs  Lab 12/07/21 2149  CKTOTAL 29*   BNP (last 3 results) Recent Labs    12/07/21 2149  BNP 35.1    ProBNP (last 3 results) No results for input(s): "PROBNP" in the last 8760 hours.  CBG: No results for input(s): "GLUCAP" in the last 168 hours.  Recent Results (from the past 240 hour(s))  Blood culture (routine x 2)     Status: None (Preliminary result)   Collection Time: 12/07/21 11:10 PM   Specimen: BLOOD  Result Value Ref Range Status   Specimen Description BLOOD RIGHT ANTECUBITAL  Final   Special Requests   Final    BOTTLES DRAWN AEROBIC AND ANAEROBIC Blood Culture adequate volume   Culture   Final    NO GROWTH < 12 HOURS Performed at [facility], [address], [place], [place] [address]    Report Status PENDING  Incomplete  Blood culture (routine x 2)     Status: None (Preliminary result)   Collection Time: 12/07/21 11:25 PM   Specimen: BLOOD  Result Value Ref Range Status   Specimen Description BLOOD LEFT ANTECUBITAL  Final   Special Requests   Final    BOTTLES DRAWN AEROBIC AND ANAEROBIC Blood Culture results may not be optimal due to an excessive volume of blood received in culture bottles   Culture   Final    NO GROWTH < 12 HOURS Performed at [facility], [place] [address], [place], [place] [address]    Report Status PENDING  Incomplete  MRSA Next Gen by PCR, Nasal     Status: None   Collection Time: 12/07/21 11:29 PM   Specimen: Nasal Mucosa; Nasal Swab   Result Value Ref Range Status   MRSA by PCR Next Gen NOT DETECTED NOT DETECTED Final    Comment: (NOTE) The GeneXpert MRSA Assay (FDA approved for NASAL specimens only), is one component of a comprehensive MRSA colonization surveillance program. It is not intended to diagnose MRSA infection nor to guide or monitor treatment for MRSA infections. Test performance is not FDA approved in patients less than 25 years old. Performed at [facility], [place] [address] [place], [place] [address]      Studies: CT Chest Wo Contrast  Result Date: 12/07/2021 CLINICAL DATA:  Pneumonia, complication suspected, abnormal x-ray. EXAM: CT CHEST WITHOUT CONTRAST TECHNIQUE: Multidetector CT imaging of the chest was performed following the standard protocol without IV contrast. RADIATION DOSE REDUCTION: This exam was performed according to the departmental dose-optimization program which includes automated exposure control, adjustment of the mA and/or kV according to patient size and/or use of iterative reconstruction technique. COMPARISON:  None Available. FINDINGS: Cardiovascular: Mild coronary artery  calcification. Global cardiac size within normal limits. No pericardial effusion. Central pulmonary arteries are of normal caliber. Minimal atherosclerotic calcification within the thoracic aorta. No aortic aneurysm. Mediastinum/Nodes: Visualized thyroid is unremarkable. Shotty right paratracheal adenopathy may be reactive in nature. No frankly pathologic thoracic adenopathy identified. The esophagus is unremarkable. Lungs/Pleura: There is centrilobular nodular and ground-glass pulmonary infiltrate within the left mid lung zone and lung bases bilaterally with more focal consolidation within the basilar right middle lobe and lateral and posterobasal right lower lobe. There is superimposed areas of cavitation within the areas of consolidation within the right middle lobe and posterior segment of the right lower lobe.  Altogether, the findings are in keeping with multifocal necrotizing pneumonia. No central obstructing lesion; the central airways are widely patent. No pneumothorax or pleural effusion. Upper Abdomen: No acute abnormality. Musculoskeletal: No chest wall mass or suspicious bone lesions identified. IMPRESSION: 1. Multifocal necrotizing pneumonia, most severe within the right middle lobe and right lower lobe. 2. Mild coronary artery calcification. Electronically Signed   By: Fidela Salisbury M.D.   On: 12/07/2021 22:15   DG Chest 2 View  Result Date: 12/07/2021 CLINICAL DATA:  Shortness of breath EXAM: CHEST - 2 VIEW COMPARISON:  None Available. FINDINGS: The heart size and mediastinal contours are within normal limits. Consolidation of the right middle lobe with associated lucency on lateral view. Probable additional more mild consolidations of the bilateral lower lobes. No pleural effusion or pneumothorax. IMPRESSION: 1. Right middle lobe consolidation with associated lucency which is concerning for cavitary pneumonia. Recommend further evaluation with contrast-enhanced chest CT. 2. Probable additional more mild consolidations of the bilateral lower lobes, concerning for multifocal infection. Electronically Signed   By: Yetta Glassman M.D.   On: 12/07/2021 17:53    Scheduled Meds:  enoxaparin (LOVENOX) injection  40 mg Subcutaneous QHS   metoprolol tartrate  100 mg Oral BID   sodium chloride flush  3 mL Intravenous Q12H   Continuous Infusions:  sodium chloride 100 mL/hr at 12/08/21 0817   ampicillin-sulbactam (UNASYN) IV Stopped (12/08/21 0710)    Principal Problem:   Necrotizing pneumonia ([place]) Active Problems:   Essential hypertension   Chronic right shoulder pain   AKI (acute kidney injury) ([place])   Hyperkalemia   Consultants: None  Procedures: None  Antibiotics: Zosyn and vancomycin x1 dose on 9/25 Unasyn 9/25 >>  Time spent: 40 minutes  Erin Hearing ANP  [facility] 7 am - 330 pm/M-F for direct patient care and secure chat Please refer to [place] for contact info 1  days  ATTENDING ATTESTATION  Agree with the above assessment and plan, patient admitted for R middle and lower lobe necrotizing pneumonia thought to have arisen from recent aspiration event.  He does not meet sepsis criteria. Respiratory status stable on supplemental oxygen - conservative management for now - if worsening symptoms will involve PCCM/Pulmonology vs CTCx for further recommendations/imaging.   Holli Humbles DO

## 2021-12-09 DIAGNOSIS — J850 Gangrene and necrosis of lung: Secondary | ICD-10-CM | POA: Diagnosis not present

## 2021-12-09 LAB — BASIC METABOLIC PANEL
Anion gap: 10 (ref 5–15)
BUN: 16 mg/dL (ref 8–23)
CO2: 25 mmol/L (ref 22–32)
Calcium: 9.1 mg/dL (ref 8.9–10.3)
Chloride: 104 mmol/L (ref 98–111)
Creatinine, Ser: 1.35 mg/dL — ABNORMAL HIGH (ref 0.61–1.24)
GFR, Estimated: 56 mL/min — ABNORMAL LOW (ref >60)
Glucose, Bld: 82 mg/dL (ref 70–99)
Potassium: 4.4 mmol/L (ref 3.5–5.1)
Sodium: 139 mmol/L (ref 135–145)

## 2021-12-09 LAB — CBC
HCT: 36.6 % — ABNORMAL LOW (ref 39.0–52.0)
Hemoglobin: 12.4 g/dL — ABNORMAL LOW (ref 13.0–17.0)
MCH: 29.5 pg (ref 26.0–34.0)
MCHC: 33.9 g/dL (ref 30.0–36.0)
MCV: 86.9 fL (ref 80.0–100.0)
Platelets: 151 10*3/uL (ref 150–400)
RBC: 4.21 MIL/uL — ABNORMAL LOW (ref 4.22–5.81)
RDW: 13 % (ref 11.5–15.5)
WBC: 7.2 10*3/uL (ref 4.0–10.5)
nRBC: 0 % (ref 0.0–0.2)

## 2021-12-09 MED ORDER — MAGNESIUM OXIDE -MG SUPPLEMENT 400 (240 MG) MG PO TABS
400.0000 mg | ORAL_TABLET | Freq: Two times a day (BID) | ORAL | Status: DC
  Administered 2021-12-09 – 2021-12-10 (×3): 400 mg via ORAL
  Filled 2021-12-09 (×4): qty 1

## 2021-12-09 MED ORDER — ACETAMINOPHEN 325 MG PO TABS
650.0000 mg | ORAL_TABLET | Freq: Four times a day (QID) | ORAL | Status: DC | PRN
  Administered 2021-12-09 – 2021-12-10 (×3): 650 mg via ORAL
  Filled 2021-12-09 (×3): qty 2

## 2021-12-09 MED ORDER — ADULT MULTIVITAMIN W/MINERALS CH
1.0000 | ORAL_TABLET | Freq: Every day | ORAL | Status: DC
  Administered 2021-12-09 – 2021-12-10 (×2): 1 via ORAL
  Filled 2021-12-09 (×2): qty 1

## 2021-12-09 MED ORDER — AMOXICILLIN-POT CLAVULANATE 875-125 MG PO TABS
1.0000 | ORAL_TABLET | Freq: Two times a day (BID) | ORAL | Status: DC
  Administered 2021-12-10: 1 via ORAL
  Filled 2021-12-09: qty 1

## 2021-12-09 NOTE — Progress Notes (Signed)
Logan Phillips  ENI:[id number] DOB: 1951-02-04 DOA: 12/07/2021 PCP: Sandi Mariscal, MD    Brief Narrative:  71 year old with a history of chronic shoulder pain, HTN, and acoustic neuroma resection 2020 who presented to the ER with progressively worsening shortness of breath and general malaise.  He suffered an acute illness approximately 4 weeks prior to this admission with severe nausea and vomiting over a 3-day period.  After recovering from this illness he developed progressive shortness of breath with a productive cough.  He completed a course of oral oral antibiotics as an outpatient with some initial improvement but then began to worsen again over the 2-week period prior to this admission.  In the ER the patient was afebrile and maintaining saturations on room air.  CT chest however noted multifocal necrotizing pneumonia.  Consultants:  None  Goals of Care:  Code Status: Full Code   DVT prophylaxis: Lovenox  Interim Hx: Afebrile.  Vital signs stable.  Saturations 98% room air.  In good spirits.  Quite pleasant.  Denies chest pain fevers chills nausea or vomiting.  States that he feels remarkably better overall.  No new complaints.  Still feels somewhat dyspneic on exertion but overall improving.  Assessment & Plan:  Right necrotizing multifocal pneumonia -suspected aspiration pneumonia Empiric broad-spectrum antibiotic -supportive O2 as needed -MRSA screen negative -check ambulatory saturation  Acute kidney injury Creatinine 1.78 at presentation with no known history of CKD -creatinine steadily improving with volume resuscitation -recheck in a.m.  HTN Continue usual home blood pressure medicine  Chronic right shoulder pain Continue usual dose of oxycodone  History of acoustic neuroma resection 2020   Family Communication: With daughter via telephone at length in the room in front of patient Disposition: Anticipate discharge home, possibly 9/28 -need to assess ambulatory oxygen  saturations   Objective: Blood pressure (!) 130/93, pulse 61, temperature 97.8 F (36.6 C), temperature source Oral, resp. rate 18, height '5\' 7"'$  (1.702 m), weight 69.4 kg, SpO2 98 %.  Intake/Output Summary (Last 24 hours) at 12/09/2021 0910 Last data filed at 12/09/2021 0813 Gross per 24 hour  Intake 3 ml  Output --  Net 3 ml   Filed Weights   12/07/21 2300 12/08/21 2022  Weight: 74.8 kg 69.4 kg    Examination: General: No acute respiratory distress Lungs: Mild bibasilar crackles without wheezing Cardiovascular: Regular rate and rhythm without murmur gallop or rub normal S1 and S2 Abdomen: Nontender, nondistended, soft, bowel sounds positive, no rebound, no ascites, no appreciable mass Extremities: No significant cyanosis, clubbing, or edema bilateral lower extremities  CBC: Recent Labs  Lab 12/07/21 1721 12/08/21 0155 12/09/21 0512  WBC 8.8 7.8 7.2  NEUTROABS 6.3  --   --   HGB 15.1 12.7* 12.4*  HCT 44.1 36.3* 36.6*  MCV 87.3 87.3 86.9  PLT 199 148* 614   Basic Metabolic Panel: Recent Labs  Lab 12/07/21 1721 12/08/21 0155 12/09/21 0512  NA 135 135 139  K 5.2* 4.1 4.4  CL 100 103 104  CO2 '26 23 25  '$ GLUCOSE 108* 114* 82  BUN 28* 27* 16  CREATININE 1.78* 1.56* 1.35*  CALCIUM 9.4 8.4* 9.1  MG  --  2.2  --    GFR: Estimated Creatinine Clearance: 47.6 mL/min (A) (by C-G formula based on SCr of 1.35 mg/dL (H)).   Scheduled Meds:  enoxaparin (LOVENOX) injection  40 mg Subcutaneous QHS   metoprolol tartrate  100 mg Oral BID   sodium chloride flush  3 mL Intravenous  Q12H   Continuous Infusions:  sodium chloride 100 mL/hr at 12/08/21 2019   ampicillin-sulbactam (UNASYN) IV 3 g (12/09/21 0640)     LOS: 2 days   Cherene Altes, MD [facility] Office  [phone] Pager - Text Page per Amion  If 7PM-7AM, please contact night-coverage per Amion 12/09/2021, 9:10 AM

## 2021-12-09 NOTE — Progress Notes (Signed)
PT Cancellation Note  Patient Details Name: Logan Phillips MRN: [id number] DOB: 1950-06-13   Cancelled Treatment:    Reason Eval/Treat Not Completed: PT screened, no needs identified, will sign off (OT evaluated; pt is independent)  Wyona Almas, PT, DPT [place] 12/09/2021, 10:46 AM

## 2021-12-09 NOTE — Evaluation (Signed)
Occupational Therapy Evaluation and Discharge Patient Details Name: Logan Phillips MRN: [id number] DOB: [birth year] Today's Date: 12/09/2021   History of Present Illness Pt admitted on 12/07/21 with SOB and malaise, + necrotizing PNA suspected due to aspiration of vomit when pt was recently ill with GI virus. PMH: acoustic neuroma, HTN.   Clinical Impression   Pt is functioning independently in mobility and ADLs. VSS on RA throughout session. Recommended portable tele box so pt may move about without assist.      Recommendations for follow up therapy are one component of a multi-disciplinary discharge planning process, led by the attending physician.  Recommendations may be updated based on patient status, additional functional criteria and insurance authorization.   Follow Up Recommendations  No OT follow up    Assistance Recommended at Discharge None  Patient can return home with the following      Functional Status Assessment  Patient has had a recent decline in their functional status and demonstrates the ability to make significant improvements in function in a reasonable and predictable amount of time.  Equipment Recommendations  None recommended by OT    Recommendations for Other Services       Precautions / Restrictions Precautions Precautions: None Restrictions Weight Bearing Restrictions: No      Mobility Bed Mobility Overal bed mobility: Modified Independent             General bed mobility comments: HOB up    Transfers Overall transfer level: Independent Equipment used: None                      Balance Overall balance assessment: Independent                                         ADL either performed or assessed with clinical judgement   ADL Overall ADL's : Independent                                             Vision Ability to See in Adequate Light: 0 Adequate Patient Visual Report: No change  from baseline       Perception     Praxis      Pertinent Vitals/Pain Pain Assessment Pain Assessment: No/denies pain     Hand Dominance Right   Extremity/Trunk Assessment Upper Extremity Assessment Upper Extremity Assessment: Overall WFL for tasks assessed   Lower Extremity Assessment Lower Extremity Assessment: Overall WFL for tasks assessed   Cervical / Trunk Assessment Cervical / Trunk Assessment: Normal   Communication Communication Communication: Other (comment) (deaf in L ear)   Cognition Arousal/Alertness: Awake/alert Behavior During Therapy: WFL for tasks assessed/performed Overall Cognitive Status: Within Functional Limits for tasks assessed                                       General Comments       Exercises     Shoulder Instructions      Home Living Family/patient expects to be discharged to:: Private residence Living Arrangements: Non-relatives/Friends Available Help at Discharge: Available 24 hours/day Type of Home: House Home Access: Stairs to enter [employer] of Steps: 2   Home Layout:  One level     Bathroom Shower/Tub: Teacher, early years/pre: Standard     Home Equipment: None          Prior Functioning/Environment Prior Level of Function : Independent/Modified Independent                        OT Problem List:        OT Treatment/Interventions:      OT Goals(Current goals can be found in the care plan section)    OT Frequency:      Co-evaluation              AM-PAC OT "6 Clicks" Daily Activity     Outcome Measure Help from another person eating meals?: None Help from another person taking care of personal grooming?: None Help from another person toileting, which includes using toliet, bedpan, or urinal?: None Help from another person bathing (including washing, rinsing, drying)?: None Help from another person to put on and taking off regular upper body clothing?:  None Help from another person to put on and taking off regular lower body clothing?: None 6 Click Score: 24   End of Session Nurse Communication: Mobility status;Other (comment) (needs portable tele box)  Activity Tolerance: Patient tolerated treatment well Patient left: in chair;with call bell/phone within reach;with nursing/sitter in room  OT Visit Diagnosis:  (decreased activity tolerance)                Time: 1030-1050 OT Time Calculation (min): 20 min Charges:  OT General Charges $OT Visit: 1 Visit OT Evaluation $OT Eval Low Complexity: [name], OTR/L Acute Rehabilitation Services Office: [phone]   Malka So 12/09/2021, 10:53 AM

## 2021-12-10 ENCOUNTER — Other Ambulatory Visit (HOSPITAL_COMMUNITY): Payer: Self-pay

## 2021-12-10 LAB — COMPREHENSIVE METABOLIC PANEL
ALT: 11 U/L (ref 0–44)
AST: 14 U/L — ABNORMAL LOW (ref 15–41)
Albumin: 2.7 g/dL — ABNORMAL LOW (ref 3.5–5.0)
Alkaline Phosphatase: 54 U/L (ref 38–126)
Anion gap: 11 (ref 5–15)
BUN: 12 mg/dL (ref 8–23)
CO2: 25 mmol/L (ref 22–32)
Calcium: 8.9 mg/dL (ref 8.9–10.3)
Chloride: 103 mmol/L (ref 98–111)
Creatinine, Ser: 1.29 mg/dL — ABNORMAL HIGH (ref 0.61–1.24)
GFR, Estimated: 60 mL/min — ABNORMAL LOW (ref >60)
Glucose, Bld: 94 mg/dL (ref 70–99)
Potassium: 4 mmol/L (ref 3.5–5.1)
Sodium: 139 mmol/L (ref 135–145)
Total Bilirubin: 0.3 mg/dL (ref 0.3–1.2)
Total Protein: 5.7 g/dL — ABNORMAL LOW (ref 6.5–8.1)

## 2021-12-10 LAB — CBC
HCT: 34.3 % — ABNORMAL LOW (ref 39.0–52.0)
Hemoglobin: 12.1 g/dL — ABNORMAL LOW (ref 13.0–17.0)
MCH: 30.1 pg (ref 26.0–34.0)
MCHC: 35.3 g/dL (ref 30.0–36.0)
MCV: 85.3 fL (ref 80.0–100.0)
Platelets: 154 10*3/uL (ref 150–400)
RBC: 4.02 MIL/uL — ABNORMAL LOW (ref 4.22–5.81)
RDW: 12.9 % (ref 11.5–15.5)
WBC: 7.5 10*3/uL (ref 4.0–10.5)
nRBC: 0 % (ref 0.0–0.2)

## 2021-12-10 LAB — MAGNESIUM: Magnesium: 2.1 mg/dL (ref 1.7–2.4)

## 2021-12-10 LAB — PROCALCITONIN: Procalcitonin: <0.1 ng/mL

## 2021-12-10 MED ORDER — BUTALBITAL-APAP-CAFFEINE 50-325-40 MG PO TABS
1.0000 | ORAL_TABLET | Freq: Four times a day (QID) | ORAL | 0 refills | Status: DC | PRN
  Filled 2021-12-10: qty 20, 4d supply, fill #0

## 2021-12-10 MED ORDER — AMOXICILLIN-POT CLAVULANATE 875-125 MG PO TABS
1.0000 | ORAL_TABLET | Freq: Two times a day (BID) | ORAL | 0 refills | Status: DC
  Filled 2021-12-10: qty 14, 7d supply, fill #0

## 2021-12-10 MED ORDER — BUTALBITAL-APAP-CAFFEINE 50-325-40 MG PO TABS
2.0000 | ORAL_TABLET | Freq: Once | ORAL | Status: AC
  Administered 2021-12-10: 2 via ORAL
  Filled 2021-12-10: qty 2

## 2021-12-10 MED ORDER — ACETAMINOPHEN 325 MG PO TABS: 650.0000 mg | ORAL_TABLET | Freq: Four times a day (QID) | ORAL | Status: DC | PRN

## 2021-12-10 NOTE — Care Management Important Message (Signed)
Important Message  Patient Details  Name: Logan Phillips MRN: [id number] Date of Birth: October 11, 1950   Medicare Important Message Given:  Yes     Hannah Beat 12/10/2021, 12:33 PM

## 2021-12-10 NOTE — Discharge Summary (Signed)
DISCHARGE SUMMARY  Logan Phillips  MR#: [id number]  DOB:12/01/1950  Date of Admission: 12/07/2021 Date of Discharge: 12/10/2021  Attending Physician:Vessie Olmsted Hennie Duos, MD  Patient's PCP:Sun, Mikeal Hawthorne, MD  Consults: None  Disposition: Discharge home  Follow-up Appts:  Follow-up Information     Sandi Mariscal, MD Follow up.   Specialty: Internal Medicine Contact information: [place] [phone] [phone]                 Tests Needing Follow-up: -f/u CT chest is suggested in 3-4 weeks to assure cavitations have resolved   Discharge Diagnoses: R > L necrotizing multifocal pneumonia Aspiration pneumonia Acute kidney injury HTN Chronic right shoulder pain History of acoustic neuroma resection 2020  Initial presentation: 71 year old with a history of chronic shoulder pain, HTN, and acoustic neuroma resection 2020 who presented to the ER with progressively worsening shortness of breath and general malaise.  He suffered an acute illness approximately 4 weeks prior to this admission with severe nausea and vomiting over a 3-day period.  After recovering from this illness he developed progressive shortness of breath with a productive cough.  He completed a course of oral oral antibiotics as an outpatient with some initial improvement but then began to worsen again over the 2-week period prior to this admission.  In the ER the patient was afebrile and maintaining saturations on room air.  CT chest however noted multifocal necrotizing pneumonia.  Hospital Course:  R > L necrotizing multifocal pneumonia -suspected aspiration pneumonia Empiric broad-spectrum antibiotic administered VI initially, then transitioned to oral when patient rapidly improved - supportive O2 was able to be rapidly tapered off, and pt satting 90% or > on RA even w/ exertion at time of d/c  -MRSA screen negative - suspect this was simply due to multiple episodes of vomiting    Acute kidney  injury Creatinine 1.78 at presentation with no known history of CKD -creatinine steadily improved with volume resuscitation    HTN Continue usual home blood pressure medicine after d/c    Chronic right shoulder pain Continue usual dose of oxycodone w/o change    History of acoustic neuroma resection 2020  Allergies as of 12/10/2021   No Known Allergies      Medication List     TAKE these medications    acetaminophen 325 MG tablet Commonly known as: TYLENOL Take 2 tablets (650 mg total) by mouth every 6 (six) hours as needed for mild pain, fever or headache.   amoxicillin-clavulanate 875-125 MG tablet Commonly known as: AUGMENTIN Take 1 tablet by mouth 2 (two) times daily.   butalbital-acetaminophen-caffeine 50-325-40 MG tablet Commonly known as: FIORICET Take 1-2 tablets by mouth every 6 (six) hours as needed for headache.   lisinopril 20 MG tablet Commonly known as: ZESTRIL Take 1 tablet (20 mg total) by mouth daily.   magnesium oxide 400 MG tablet Commonly known as: MAG-OX Take 400 mg by mouth 2 (two) times daily.   meloxicam 15 MG tablet Commonly known as: MOBIC Take 15 mg by mouth daily.   metoprolol tartrate 100 MG tablet Commonly known as: LOPRESSOR Take 1 tablet (100 mg total) by mouth 2 (two) times daily.   multivitamin with minerals Tabs tablet Take 1 tablet by mouth daily.   Oxycodone HCl 10 MG Tabs Take 10 mg by mouth in the morning, at noon, in the evening, and at bedtime.        Day of Discharge BP (!) 148/88   Pulse Marland Kitchen)  54   Temp 97.9 F (36.6 C) (Oral)   Resp 18   Ht '5\' 7"'$  (1.702 m)   Wt 69.4 kg   SpO2 97%   BMI 23.96 kg/m   Physical Exam: General: No acute respiratory distress Lungs: Clear to auscultation bilaterally without wheezes or crackles Cardiovascular: Regular rate and rhythm without murmur  Abdomen: Nontender, nondistended, soft, bowel sounds positive, no rebound, no ascites, no appreciable mass Extremities: No  significant cyanosis, clubbing, or edema bilateral lower extremities  Basic Metabolic Panel: Recent Labs  Lab 12/07/21 1721 12/08/21 0155 12/09/21 0512 12/10/21 0329  NA 135 135 139 139  K 5.2* 4.1 4.4 4.0  CL 100 103 104 103  CO2 '26 23 25 25  '$ GLUCOSE 108* 114* 82 94  BUN 28* 27* 16 12  CREATININE 1.78* 1.56* 1.35* 1.29*  CALCIUM 9.4 8.4* 9.1 8.9  MG  --  2.2  --  2.1    CBC: Recent Labs  Lab 12/07/21 1721 12/08/21 0155 12/09/21 0512 12/10/21 0329  WBC 8.8 7.8 7.2 7.5  NEUTROABS 6.3  --   --   --   HGB 15.1 12.7* 12.4* 12.1*  HCT 44.1 36.3* 36.6* 34.3*  MCV 87.3 87.3 86.9 85.3  PLT 199 148* 151 154    Recent Results (from the past 240 hour(s))  Blood culture (routine x 2)     Status: None (Preliminary result)   Collection Time: 12/07/21 11:10 PM   Specimen: BLOOD  Result Value Ref Range Status   Specimen Description BLOOD RIGHT ANTECUBITAL  Final   Special Requests   Final    BOTTLES DRAWN AEROBIC AND ANAEROBIC Blood Culture adequate volume   Culture   Final    NO GROWTH 3 DAYS Performed at [facility], [address], [place], [place] [address]    Report Status PENDING  Incomplete  Blood culture (routine x 2)     Status: None (Preliminary result)   Collection Time: 12/07/21 11:25 PM   Specimen: BLOOD  Result Value Ref Range Status   Specimen Description BLOOD LEFT ANTECUBITAL  Final   Special Requests   Final    BOTTLES DRAWN AEROBIC AND ANAEROBIC Blood Culture results may not be optimal due to an excessive volume of blood received in culture bottles   Culture   Final    NO GROWTH 3 DAYS Performed at [facility], [place] [address] [place], [place] [address]    Report Status PENDING  Incomplete  MRSA Next Gen by PCR, Nasal     Status: None   Collection Time: 12/07/21 11:29 PM   Specimen: Nasal Mucosa; Nasal Swab  Result Value Ref Range Status   MRSA by PCR Next Gen NOT DETECTED NOT DETECTED Final    Comment: (NOTE) The GeneXpert MRSA Assay  (FDA approved for NASAL specimens only), is one component of a comprehensive MRSA colonization surveillance program. It is not intended to diagnose MRSA infection nor to guide or monitor treatment for MRSA infections. Test performance is not FDA approved in patients less than 12 years old. Performed at [facility], [place] [address] [place], [place] [address]      Time spent in discharge (includes decision making & examination of pt): 35 minutes  12/10/2021, 5:19 PM   Cherene Altes, MD [facility] Office  [phone]

## 2021-12-12 LAB — CULTURE, BLOOD (ROUTINE X 2)
Culture: NO GROWTH
Culture: NO GROWTH
Special Requests: ADEQUATE

## 2022-08-02 ENCOUNTER — Other Ambulatory Visit: Payer: Self-pay | Admitting: Neurosurgery

## 2022-08-02 DIAGNOSIS — D333 Benign neoplasm of cranial nerves: Secondary | ICD-10-CM

## 2022-08-04 ENCOUNTER — Encounter: Payer: Self-pay | Admitting: Medical

## 2022-08-04 ENCOUNTER — Ambulatory Visit (INDEPENDENT_AMBULATORY_CARE_PROVIDER_SITE_OTHER): Payer: 59 | Admitting: Medical

## 2022-08-04 VITALS — BP 88/60 | HR 63 | Ht 67.5 in | Wt 139.4 lb

## 2022-08-04 DIAGNOSIS — M1A9XX0 Chronic gout, unspecified, without tophus (tophi): Secondary | ICD-10-CM | POA: Diagnosis not present

## 2022-08-04 DIAGNOSIS — I959 Hypotension, unspecified: Secondary | ICD-10-CM | POA: Insufficient documentation

## 2022-08-04 DIAGNOSIS — I2510 Atherosclerotic heart disease of native coronary artery without angina pectoris: Secondary | ICD-10-CM | POA: Diagnosis not present

## 2022-08-04 DIAGNOSIS — E785 Hyperlipidemia, unspecified: Secondary | ICD-10-CM | POA: Diagnosis not present

## 2022-08-04 DIAGNOSIS — Z125 Encounter for screening for malignant neoplasm of prostate: Secondary | ICD-10-CM

## 2022-08-04 DIAGNOSIS — R9431 Abnormal electrocardiogram [ECG] [EKG]: Secondary | ICD-10-CM

## 2022-08-04 DIAGNOSIS — Z0000 Encounter for general adult medical examination without abnormal findings: Secondary | ICD-10-CM | POA: Insufficient documentation

## 2022-08-04 DIAGNOSIS — R42 Dizziness and giddiness: Secondary | ICD-10-CM

## 2022-08-04 DIAGNOSIS — Z87898 Personal history of other specified conditions: Secondary | ICD-10-CM

## 2022-08-04 DIAGNOSIS — H04129 Dry eye syndrome of unspecified lacrimal gland: Secondary | ICD-10-CM

## 2022-08-04 DIAGNOSIS — I10 Essential (primary) hypertension: Secondary | ICD-10-CM | POA: Diagnosis not present

## 2022-08-04 MED ORDER — PREDNISONE 20 MG PO TABS: ORAL_TABLET | ORAL | 0 refills | Status: DC

## 2022-08-04 NOTE — Progress Notes (Unsigned)
Subjective:   HPI  Logan Phillips is a 72 y.o. male who presents for Chief Complaint  Patient presents with   Annual Exam    Fasting cpe, would like blood work     Patient Care Team: Idara Woodside, Cleda Mccreedy as PCP - General (Family Medicine) Dr. Lisbeth Renshaw, neurosurgery Dr. Charna Elizabeth, GI Pain managemnt, [facility] and [facility] prior  Concerns: Here as a returning patient.  He has been seen at this clinic in the past but it was several years ago in 2020.  Here for physical.  Recently has had some dizziness.  But no chest pain, palpitations, edema, slurred speech or changes otherwise.  He would like a PSA prostate cancer screening.  He wants other labs including uric acid, liver and kidney test.  He has chronic facial weakness since his brain tumor surgery.  He had brain tumor and surgery for this back in 2020  He has chronic dry eye  Recently has had gout flareups in multiple joints.  Was prescribed steroid and allopurinol.  He is taking prednisone twice a day and just had a renewed prescription but no taper.  He read about he should have a taper.  He does want to know if I can help with this.  Reviewed their medical, surgical, family, social, medication, and allergy history and updated chart as appropriate.  No Known Allergies  Past Medical History:  Diagnosis Date   Chronic pain in shoulder 2018   [facility]   H/O echocardiogram 10/08/2016   EF 65-70%, normal wall motion, systolic function vigorous   Hearing loss    pending hearing aids 06/2014   Hypertension    Lipoma    Varicose vein    Vestibular schwannoma (HCC) 04/2018    Current Outpatient Medications on File Prior to Visit  Medication Sig Dispense Refill   allopurinol (ZYLOPRIM) 100 MG tablet Take 1 tablet by mouth daily.     CRANBERRY PO Take by mouth.     lisinopril (ZESTRIL) 10 MG tablet Take 10 mg by mouth daily.     magnesium oxide (MAG-OX) 400 MG  tablet Take 400 mg by mouth 2 (two) times daily.     meloxicam (MOBIC) 15 MG tablet Take 15 mg by mouth daily.     metoprolol tartrate (LOPRESSOR) 100 MG tablet Take 1 tablet (100 mg total) by mouth 2 (two) times daily. 180 tablet 3   MILK THISTLE PO Take by mouth.     Multiple Vitamin (MULITIVITAMIN WITH MINERALS) TABS Take 1 tablet by mouth daily.     Oxycodone HCl 10 MG TABS Take 10 mg by mouth 5 (five) times daily.     predniSONE (DELTASONE) 20 MG tablet Take 20 mg by mouth 2 (two) times daily with a meal.     psyllium (REGULOID) 0.52 g capsule Take 0.52 g by mouth daily.     No current facility-administered medications on file prior to visit.      Current Outpatient Medications:    allopurinol (ZYLOPRIM) 100 MG tablet, Take 1 tablet by mouth daily., Disp: , Rfl:    CRANBERRY PO, Take by mouth., Disp: , Rfl:    lisinopril (ZESTRIL) 10 MG tablet, Take 10 mg by mouth daily., Disp: , Rfl:    magnesium oxide (MAG-OX) 400 MG tablet, Take 400 mg by mouth 2 (two) times daily., Disp: , Rfl:    meloxicam (MOBIC) 15 MG tablet, Take 15 mg by mouth daily., Disp: ,  Rfl:    metoprolol tartrate (LOPRESSOR) 100 MG tablet, Take 1 tablet (100 mg total) by mouth 2 (two) times daily., Disp: 180 tablet, Rfl: 3   MILK THISTLE PO, Take by mouth., Disp: , Rfl:    Multiple Vitamin (MULITIVITAMIN WITH MINERALS) TABS, Take 1 tablet by mouth daily., Disp: , Rfl:    Oxycodone HCl 10 MG TABS, Take 10 mg by mouth 5 (five) times daily., Disp: , Rfl:    predniSONE (DELTASONE) 20 MG tablet, Take 20 mg by mouth 2 (two) times daily with a meal., Disp: , Rfl:    predniSONE (DELTASONE) 20 MG tablet, 1 tablet daily for 5 days, then 1/2 tablet for 5 days, Disp: 10 tablet, Rfl: 0   psyllium (REGULOID) 0.52 g capsule, Take 0.52 g by mouth daily., Disp: , Rfl:   Family History  Problem Relation Age of Onset   Hypertension Mother    Dementia Father    Parkinsonism Father    Diabetes Father        early stages   Cancer  Maternal Grandmother        breast   Heart disease Neg Hx    Stroke Neg Hx     Past Surgical History:  Procedure Laterality Date   APPENDECTOMY     APPLICATION OF CRANIAL NAVIGATION Right 05/09/2018   Procedure: APPLICATION OF CRANIAL NAVIGATION;  Surgeon: Lisbeth Renshaw, MD;  Location: [facility] OR;  Service: Neurosurgery;  Laterality: Right;   BIOPSY  04/12/2018   Procedure: BIOPSY;  Surgeon: Charna Elizabeth, MD;  Location: [facility] ENDOSCOPY;  Service: Endoscopy;;   COLONOSCOPY     age 16, he did not f/u with referral 2018   COLONOSCOPY WITH PROPOFOL N/A 04/12/2018   Procedure: COLONOSCOPY WITH PROPOFOL;  Surgeon: Charna Elizabeth, MD;  Location: [facility] ENDOSCOPY;  Service: Endoscopy;  Laterality: N/A;   CRANIOTOMY Right 05/09/2018   Procedure: Right retrosigmoid craniectomy, resection of acoustic neuroma with intraoperative facial monitoring/Brain Lab;  Surgeon: Lisbeth Renshaw, MD;  Location: [facility] OR;  Service: Neurosurgery;  Laterality: Right;   LUMBAR DISC SURGERY  2002   Dr. Jule Ser   WISDOM TOOTH EXTRACTION      Review of Systems  Constitutional:  Negative for chills, fever, malaise/fatigue and weight loss.  HENT:  Negative for congestion, ear pain, hearing loss, sore throat and tinnitus.   Eyes:  Negative for blurred vision, pain and redness.  Respiratory:  Negative for cough, hemoptysis and shortness of breath.   Cardiovascular:  Negative for chest pain, palpitations, orthopnea, claudication and leg swelling.  Gastrointestinal:  Negative for abdominal pain, blood in stool, constipation, diarrhea, nausea and vomiting.  Genitourinary:  Negative for dysuria, flank pain, frequency, hematuria and urgency.  Musculoskeletal:  Negative for falls, joint pain and myalgias.  Skin:  Negative for itching and rash.  Neurological:  Positive for dizziness. Negative for tingling, speech change, weakness and headaches.  Endo/Heme/Allergies:  Negative for polydipsia. Does not bruise/bleed easily.   Psychiatric/Behavioral:  Negative for depression and memory loss. The patient is not nervous/anxious and does not have insomnia.        Objective:  BP (!) 88/60 (Patient Position: Standing)   Pulse 63   Ht 5' 7.5" (1.715 m)   Wt 139 lb 6.4 oz (63.2 kg)   BMI 21.51 kg/m   General appearance: alert, no distress, WD/WN, white male Skin: Warm, dry, no new worrisome lesions HEENT: normocephalic, conjunctiva/corneas normal, sclerae anicteric, PERRLA, EOMi, nares patent, no discharge or erythema, pharynx normal Oral cavity: MMM,  tongue normal, teeth in poor condition, decay throughout, missing some teeth Neck: supple, no lymphadenopathy, no thyromegaly, no masses, normal ROM, no bruits Chest: non tender, normal shape and expansion Heart: RRR, normal S1, S2, no murmurs Lungs: CTA bilaterally, no wheezes, rhonchi, or rales Abdomen: +bs, soft, non tender, non distended, no masses, no hepatomegaly, no splenomegaly, no bruits Back: non tender, normal ROM, no scoliosis Musculoskeletal: upper extremities non tender, no obvious deformity, normal ROM throughout, lower extremities non tender, no obvious deformity, normal ROM throughout Extremities: Chronic severe varicose veins left lower leg, no edema, no cyanosis, no clubbing Pulses: 2+ symmetric, upper and lower extremities, normal cap refill Neurological: Obvious right sided facial weakness compared to the left, ptosis on the right as well, that he reports chronic since brain tumor surgery in 2020, a little unsteady on his feet today at times likely due to low blood pressure, otherwise alert, oriented x 3, CN2-12 intact, strength normal upper extremities and lower extremities, sensation normal throughout, DTRs 2+ throughout,  gait normal Psychiatric: normal affect, behavior normal, pleasant  GU/rectal - declined    Assessment and Plan :   Encounter Diagnoses  Name Primary?   Routine general medical examination at a health care facility Yes    Essential hypertension    Hyperlipidemia, unspecified hyperlipidemia type    Coronary artery disease involving native coronary artery of native heart without angina pectoris    Chronic gout without tophus, unspecified cause, unspecified site    History of brain tumor    Abnormal EKG    Screening for prostate cancer    Dizziness    Hypotension, unspecified hypotension type    Dry eye     This visit was a preventative care visit, also known as wellness visit or routine physical.   Topics typically include healthy lifestyle, diet, exercise, preventative care, vaccinations, sick and well care, proper use of emergency dept and after hours care, as well as other concerns.     Separate significant issues discussed: Hypertension-he has been taking lisinopril 10 mg daily and metoprolol 100 mg twice daily but blood pressure too low today including orthostasis.  Advise he not take metoprolol tonight and call back tomorrow morning with a blood pressure reading before taking any other blood pressure medication.  Follow-up pending labs  Hypotension, dizziness, orthostasis-same plan as hypertension  Chronic gout-currently on prednisone and allopurinol.  Prescribed a prednisone taper since he was taking higher doses for more than 2 weeks now.  Follow-up with pain management as usual  Chronic pain-managed by pain management clinic  Hyperlipidemia-not currently on lipid medication.  There is some mild coronary artery disease on prior chest scan September 2023  Chronic dry eyes-consider over-the-counter wetting drops versus prescription restasis   General Recommendations: Continue to return yearly for your annual wellness and preventative care visits.  This gives [place] a chance to discuss healthy lifestyle, exercise, vaccinations, review your chart record, and perform screenings where appropriate.  I recommend you see your eye doctor yearly for routine vision care.  I recommend you see your dentist  yearly for routine dental care including hygiene visits twice yearly.   Vaccination  Immunization History  Administered Date(s) Administered   Pneumococcal Conjugate-13 10/04/2016   Pneumococcal Polysaccharide-23 10/05/2017   Tdap 07/08/2014   Zoster Recombinat (Shingrix) 03/21/2017, 06/14/2017    Vaccine recommendations: Consider RSV and updated prevnar 20 vaccine  Vaccines administered today: None, declines   Screening for cancer: Colon cancer screening: Prior or last colon cancer screen: 2020  Prostate Cancer screening: The recommended prostate cancer screening test is a blood test called the prostate-specific antigen (PSA) test. PSA is a protein that is made in the prostate. As you age, your prostate naturally produces more PSA. Abnormally high PSA levels may be caused by: Prostate cancer. An enlarged prostate that is not caused by cancer (benign prostatic hyperplasia, or BPH). This condition is very common in older men. A prostate gland infection (prostatitis) or urinary tract infection. Certain medicines such as male hormones (like testosterone) or other medicines that raise testosterone levels. A rectal exam may be done as part of prostate cancer screening to help provide information about the size of your prostate gland. When a rectal exam is performed, it should be done after the PSA level is drawn to avoid any effect on the results.   Skin cancer screening: Check your skin regularly for new changes, growing lesions, or other lesions of concern Come in for evaluation if you have skin lesions of concern.   Lung cancer screening: If you have a greater than 20 pack year history of tobacco use, then you may qualify for lung cancer screening with a chest CT scan.   Please call your insurance company to inquire about coverage for this test.   Pancreatic cancer:  no current screening test is available or routinely recommended. (risk factors: smoking, overweight or obese,  diabetes, chronic pancreatitis, work exposure - dry cleaning, metal working, 72yo>, M>F, Tree surgeon, family hx/o, hereditary breast, ovarian, melanoma, lynch, peutz-jeghers).  Symptoms: jaundice, dark urine, light color or greasy stools, itchy skin, belly or back pain, weight loss, poor appetite, nause, vomiting, liver enlargement, DVT/blood clots.   We currently don't have screenings for other cancers besides breast, cervical, colon, and lung cancers.  If you have a strong family history of cancer or have other cancer screening concerns, please let me know.  Genetic testing referral is an option for individuals with high cancer risk in the family.  There are some other cancer screenings in development currently.   Bone health: Get at least 150 minutes of aerobic exercise weekly Get weight bearing exercise at least once weekly Bone density test:  A bone density test is an imaging test that uses a type of X-ray to measure the amount of calcium and other minerals in your bones. The test may be used to diagnose or screen you for a condition that causes weak or thin bones (osteoporosis), predict your risk for a broken bone (fracture), or determine how well your osteoporosis treatment is working. The bone density test is recommended for females 65 and older, or females or males <65 if certain risk factors such as thyroid disease, long term use of steroids such as for asthma or rheumatological issues, vitamin D deficiency, estrogen deficiency, family history of osteoporosis, self or family history of fragility fracture in first degree relative.    Heart health: Get at least 150 minutes of aerobic exercise weekly Limit alcohol It is important to maintain a healthy blood pressure and healthy cholesterol numbers  Heart disease screening: Screening for heart disease includes screening for blood pressure, fasting lipids, glucose/diabetes screening, BMI height to weight ratio, reviewed of smoking  status, physical activity, and diet.    Goals include blood pressure 120/80 or less, maintaining a healthy lipid/cholesterol profile, preventing diabetes or keeping diabetes numbers under good control, not smoking or using tobacco products, exercising most days per week or at least 150 minutes per week of exercise, and eating healthy variety of  fruits and vegetables, healthy oils, and avoiding unhealthy food choices like fried food, fast food, high sugar and high cholesterol foods.    Other tests may possibly include EKG test, CT coronary calcium score, echocardiogram, exercise treadmill stress test.      Echocardiogram 10/08/16 Study Conclusions   - Left ventricle: GLSS is normal at -18.1% The cavity size was    normal. Systolic function was vigorous. The estimated ejection    fraction was in the range of 65% to 70%. Wall motion was normal;    there were no regional wall motion abnormalities. There was an    increased relative contribution of atrial contraction to    ventricular filling. Doppler parameters are consistent with    abnormal left ventricular relaxation (grade 1 diastolic    dysfunction).  - Mitral valve: There was trivial regurgitation.  - Tricuspid valve: There was trivial regurgitation.    Vascular disease screening: For higher risk individuals including smokers, diabetics, patients with known heart disease or high blood pressure, kidney disease, and others, screening for vascular disease or atherosclerosis of the arteries is available.  Examples may include carotid ultrasound, abdominal aortic ultrasound, ABI blood flow screening in the legs, thoracic aorta screening.   Medical care options: I recommend you continue to seek care here first for routine care.  We try really hard to have available appointments Monday through Friday daytime hours for sick visits, acute visits, and physicals.  Urgent care should be used for after hours and weekends for significant issues that  cannot wait till the next day.  The emergency department should be used for significant potentially life-threatening emergencies.  The emergency department is expensive, can often have long wait times for less significant concerns, so try to utilize primary care, urgent care, or telemedicine when possible to avoid unnecessary trips to the emergency department.  Virtual visits and telemedicine have been introduced since the pandemic started in 2020, and can be convenient ways to receive medical care.  We offer virtual appointments as well to assist you in a variety of options to seek medical care.   Legal  Take the time to do a last will and testament, Advanced Directives including Health Care Power of Attorney and Living Will documents.  Don't leave your family with burdens that can be handled ahead of time.   Advanced Directives: I recommend you consider completing a Health Care Power of Attorney and Living Will.   These documents respect your wishes and help alleviate burdens on your loved ones if you were to become terminally ill or be in a position to need those documents enforced.    You can complete Advanced Directives yourself, have them notarized, then have copies made for our office, for you and for anybody you feel should have them in safe keeping.  Or, you can have an attorney prepare these documents.   If you haven't updated your Last Will and Testament in a while, it may be worthwhile having an attorney prepare these documents together and save on some costs.       Spiritual and Emotional Health Keeping a healthy spiritual life can help you better manage your physical health. Your spiritual life can help you to cope with any issues that may arise with your physical health.  Balance can keep [place] healthy and help [place] to recover.  If you are struggling with your spiritual health there are questions that you may want to ask yourself:  What makes me feel most complete? When  do I feel most  connected to the rest of the world? Where do I find the most inner strength? What am I doing when I feel whole?  Helpful tips: Being in nature. Some people feel very connected and at peace when they are walking outdoors or are outside. Helping others. Some feel the largest sense of wellbeing when they are of service to others. Being of service can take on many forms. It can be doing volunteer work, being kind to strangers, or offering a hand to a friend in need. Gratitude. Some people find they feel the most connected when they remain grateful. They may make lists of all the things they are grateful for or say a thank you out loud for all they have.    Emotional Health Are you in tune with your emotional health?  Check out this link: http://www.marquez-love.com/    Financial Health Make sure you use a budget for your personal finances Make sure you are insured against risks (health insurance, life insurance, auto insurance, etc) Save more, spend less Set financial goals If you need help in this area, good resources include counseling through [employer] or other community resources, have a meeting with a Social research officer, government, and a good resource is the [employer]    Robinson was seen today for annual exam.  Diagnoses and all orders for this visit:  Routine general medical examination at a health care facility -     CBC with Differential/Platelet -     CMP14+EGFR -     PSA, total and free -     Lipid panel -     TSH -     Uric Acid -     Orthostatic vital signs -     POCT Urinalysis DIP (Proadvantage Device)  Essential hypertension -     CBC with Differential/Platelet  Hyperlipidemia, unspecified hyperlipidemia type -     Lipid panel  Coronary artery disease involving native coronary artery of native heart without angina pectoris  Chronic gout without tophus, unspecified cause, unspecified site -     Uric Acid  History of brain tumor  Abnormal  EKG  Screening for prostate cancer  Dizziness  Hypotension, unspecified hypotension type -     Orthostatic vital signs -     POCT Urinalysis DIP (Proadvantage Device)  Dry eye  Other orders -     predniSONE (DELTASONE) 20 MG tablet; 1 tablet daily for 5 days, then 1/2 tablet for 5 days   He was unable to leave a urine specimen today  Follow-up pending labs, yearly for physical

## 2022-08-05 ENCOUNTER — Other Ambulatory Visit: Payer: Self-pay

## 2022-08-05 ENCOUNTER — Other Ambulatory Visit: Payer: 59

## 2022-08-05 VITALS — BP 116/82

## 2022-08-05 DIAGNOSIS — Z0000 Encounter for general adult medical examination without abnormal findings: Secondary | ICD-10-CM

## 2022-08-05 DIAGNOSIS — I10 Essential (primary) hypertension: Secondary | ICD-10-CM

## 2022-08-05 LAB — CBC WITH DIFFERENTIAL/PLATELET
Basophils Absolute: 0 10*3/uL (ref 0.0–0.2)
Basos: 0 %
EOS (ABSOLUTE): 0 10*3/uL (ref 0.0–0.4)
Eos: 0 %
Hematocrit: 45 % (ref 37.5–51.0)
Hemoglobin: 15.7 g/dL (ref 13.0–17.7)
Immature Grans (Abs): 0.3 10*3/uL — ABNORMAL HIGH (ref 0.0–0.1)
Immature Granulocytes: 2 %
Lymphocytes Absolute: 1.4 10*3/uL (ref 0.7–3.1)
Lymphs: 9 %
MCH: 29.9 pg (ref 26.6–33.0)
MCHC: 34.9 g/dL (ref 31.5–35.7)
MCV: 86 fL (ref 79–97)
Monocytes Absolute: 0.9 10*3/uL (ref 0.1–0.9)
Monocytes: 6 %
Neutrophils Absolute: 13.8 10*3/uL — ABNORMAL HIGH (ref 1.4–7.0)
Neutrophils: 83 %
Platelets: 489 10*3/uL — ABNORMAL HIGH (ref 150–450)
RBC: 5.25 x10E6/uL (ref 4.14–5.80)
RDW: 13.1 % (ref 11.6–15.4)
WBC: 16.4 10*3/uL — ABNORMAL HIGH (ref 3.4–10.8)

## 2022-08-05 LAB — POCT URINALYSIS DIP (CLINITEK)
Bilirubin, UA: NEGATIVE
Blood, UA: NEGATIVE
Glucose, UA: NEGATIVE mg/dL
Ketones, POC UA: NEGATIVE mg/dL
Leukocytes, UA: NEGATIVE
Nitrite, UA: NEGATIVE
POC PROTEIN,UA: NEGATIVE
Spec Grav, UA: 1.01 (ref 1.010–1.025)
Urobilinogen, UA: 0.2 E.U./dL
pH, UA: 6 (ref 5.0–8.0)

## 2022-08-05 LAB — LIPID PANEL
Chol/HDL Ratio: 2.9 ratio (ref 0.0–5.0)
Cholesterol, Total: 160 mg/dL (ref 100–199)
HDL: 55 mg/dL (ref >39)
LDL Chol Calc (NIH): 83 mg/dL (ref 0–99)
Triglycerides: 125 mg/dL (ref 0–149)
VLDL Cholesterol Cal: 22 mg/dL (ref 5–40)

## 2022-08-05 LAB — CMP14+EGFR
ALT: 16 IU/L (ref 0–44)
AST: 20 IU/L (ref 0–40)
Albumin/Globulin Ratio: 1.7 (ref 1.2–2.2)
Albumin: 4.5 g/dL (ref 3.8–4.8)
Alkaline Phosphatase: 71 IU/L (ref 44–121)
BUN/Creatinine Ratio: 27 — ABNORMAL HIGH (ref 10–24)
BUN: 43 mg/dL — ABNORMAL HIGH (ref 8–27)
Bilirubin Total: 0.6 mg/dL (ref 0.0–1.2)
CO2: 23 mmol/L (ref 20–29)
Calcium: 10.2 mg/dL (ref 8.6–10.2)
Chloride: 94 mmol/L — ABNORMAL LOW (ref 96–106)
Creatinine, Ser: 1.59 mg/dL — ABNORMAL HIGH (ref 0.76–1.27)
Globulin, Total: 2.6 g/dL (ref 1.5–4.5)
Glucose: 93 mg/dL (ref 70–99)
Potassium: 5.4 mmol/L — ABNORMAL HIGH (ref 3.5–5.2)
Sodium: 133 mmol/L — ABNORMAL LOW (ref 134–144)
Total Protein: 7.1 g/dL (ref 6.0–8.5)
eGFR: 46 mL/min/{1.73_m2} — ABNORMAL LOW (ref >59)

## 2022-08-05 LAB — URIC ACID: Uric Acid: 5.1 mg/dL (ref 3.8–8.4)

## 2022-08-05 LAB — PSA, TOTAL AND FREE
PSA, Free Pct: 25 %
PSA, Free: 0.05 ng/mL
Prostate Specific Ag, Serum: 0.2 ng/mL (ref 0.0–4.0)

## 2022-08-05 LAB — TSH: TSH: 0.417 u[IU]/mL — ABNORMAL LOW (ref 0.450–4.500)

## 2022-08-05 MED ORDER — MELOXICAM 15 MG PO TABS: 15.0000 mg | ORAL_TABLET | Freq: Every day | ORAL | 0 refills | Status: DC

## 2022-08-05 NOTE — Telephone Encounter (Addendum)
Patient advised of lab results and message below. Patient verbalized understanding. He wants to keep the blood pressure medications on hold and continue to monitor blood pressure readings  until he comes back in to see you next week. Patient agrees to schedule appointment. Please review your schedule to let us know where to put patient. There are no 30 minute slots available. Is a 15 minute slot ok?  Also, patient denies running any fevers, night sweats or recent weight change in the last month.

## 2022-08-05 NOTE — Progress Notes (Signed)
There are several lab abnormalities including elevated white counts, elevated platelets, elevated neutrophils, kidney marker elevated suggesting dehydration given his findings yesterday, potassium is too high, thyroid marker also abnormal.  Prostate lab normal, cholesterol numbers look okay, uric acid gout marker is actually normal.  He is supposed to recheck this morning for urinalysis and repeat blood pressure  We are then going to decide what to do about his blood pressure pills.  Given these other findings we need to do a recheck next week to discuss findings and next steps  Has he been running any fevers, night sweats, any recent weight change in the last month?  He mention recent gout issues, where was he hurting at?

## 2022-08-05 NOTE — Progress Notes (Unsigned)
Patient came into the office for a nurse visit Urinalysis and blood pressure recheck. Blood pressure reading in the office was  116/82.    Patient has been checking and monitoring blood pressure at home. He brought in a log. Results are : last night  08/04/2022 9:00 pm                   88/70  11:30 pm                 103/75  Today (08/05/2022) 6:30am                  117/81         8:00am                 101/77

## 2022-08-05 NOTE — Telephone Encounter (Signed)
Patient came into the office this morning for a nurse visit Urinalysis and blood pressure recheck. Blood pressure reading in the office was 116/82.      Patient has been checking and monitoring blood pressure at home. He brought in a log. Results are : last night  08/04/2022 9:00 pm                   88/70  11:30 pm                 103/75   Today (08/05/2022) 6:30am                  117/81         8:00am                 101/77     I haven't advised of lab results yet. Please advise whether you wold like to add additional recommendations.

## 2022-08-05 NOTE — Telephone Encounter (Signed)
Patient came in the office requesting refill for Meloxicam and prednisone.

## 2022-08-05 NOTE — Telephone Encounter (Signed)
Tysinger, Kermit Balo, PA-C sent to Benjiman Core, CMA Okay, those blood pressure readings look better.  He can start back on lisinopril tomorrow but do not take the metoprolol until I see him back.  I want to see him back next week.  See other message about the abnormal lab results.  He should not resume meloxicam until he is done with the prednisone  I did send a prednisone yesterday that he can finish out his dosing pack and taper off  Lets make sure he is on the schedule for early next week such as Tuesday or Wednesday

## 2022-08-06 ENCOUNTER — Telehealth: Payer: Self-pay

## 2022-08-06 MED ORDER — MECLIZINE HCL 25 MG PO TABS: 25.0000 mg | ORAL_TABLET | Freq: Three times a day (TID) | ORAL | 0 refills | Status: DC | PRN

## 2022-08-06 NOTE — Telephone Encounter (Signed)
Sent!

## 2022-08-06 NOTE — Telephone Encounter (Signed)
Pt is scheduled for next Friday for Bp follow-up

## 2022-08-06 NOTE — Telephone Encounter (Signed)
Pt. Called stating he wanted to know if Vincenza Hews could call him in something for dizziness. I told him Vincenza Hews has already left for the day and would not be in until Tuesday. He said he used to have a script for something for dizziness. I asked him if it was meclizine and he said he thought so. I told him there was stuff he could get OTC for dizziness. He still wanted mew to send a message to the provider that was here to see if you could send it in for him.

## 2022-08-13 ENCOUNTER — Encounter: Payer: Self-pay | Admitting: Medical

## 2022-08-13 ENCOUNTER — Ambulatory Visit (INDEPENDENT_AMBULATORY_CARE_PROVIDER_SITE_OTHER): Payer: 59 | Admitting: Medical

## 2022-08-13 VITALS — BP 120/72 | HR 62 | Temp 97.7°F | Resp 16 | Wt 140.4 lb

## 2022-08-13 DIAGNOSIS — I10 Essential (primary) hypertension: Secondary | ICD-10-CM

## 2022-08-13 DIAGNOSIS — M1A49X0 Other secondary chronic gout, multiple sites, without tophus (tophi): Secondary | ICD-10-CM

## 2022-08-13 DIAGNOSIS — E875 Hyperkalemia: Secondary | ICD-10-CM

## 2022-08-13 DIAGNOSIS — D72829 Elevated white blood cell count, unspecified: Secondary | ICD-10-CM

## 2022-08-13 DIAGNOSIS — R7989 Other specified abnormal findings of blood chemistry: Secondary | ICD-10-CM

## 2022-08-13 DIAGNOSIS — R42 Dizziness and giddiness: Secondary | ICD-10-CM

## 2022-08-13 DIAGNOSIS — I9589 Other hypotension: Secondary | ICD-10-CM

## 2022-08-13 DIAGNOSIS — M79602 Pain in left arm: Secondary | ICD-10-CM

## 2022-08-13 DIAGNOSIS — M25632 Stiffness of left wrist, not elsewhere classified: Secondary | ICD-10-CM

## 2022-08-13 MED ORDER — COLCHICINE 0.6 MG PO TABS: 0.6000 mg | ORAL_TABLET | Freq: Two times a day (BID) | ORAL | 0 refills | Status: DC | PRN

## 2022-08-13 NOTE — Progress Notes (Signed)
Subjective:  Logan Phillips is a 72 y.o. male who presents for Chief Complaint  Patient presents with   Hypotension     Patient Care Team: Jennet Scroggin, Cleda Mccreedy as PCP - General (Family Medicine) Dr. Lisbeth Renshaw, neurosurgery Dr. Charna Elizabeth, GI Pain management, [facility] and [facility] prior    Here for f/u.  I saw him recently for a well visit on 08/04/22.  However, on that visit his blood pressure was quite low along with lightheadedness.  He has several abnormal labs.  We held his blood pressure medication at that time with instructions not to resume BP medicaiton until BP was back over 120/70.  He had not resumed Lisinopril 10mg  or Metoprolol 100mg  BID until 3 days ago, when he started back on Metoprolol.  Started back on Metoprolol 50mg  (1/2 tablet ) BID for last 3 days since BP has resolved.  Feels fine now, no more dizziness since BP back to normal.  Not currently taking Lisinopril.  He notes prior before being on Metoprolol pulse would stay over 100.  Here to follow up on these issues above.  He notes back in 11/2021 he had normal white counts.     Still finishing prednisone for recent gout flare up.  Prior to last visit 08/04/22 he notes that he had been having pains or gout flare ups in multiple joint. Still using left wrist brace.    Left wrist still hurting some.  Now has pain some on top of left hand.  Currently no other reported joint pains.  Starts physical therapy next week for pains. He has been back to work since the gout has calmed down.   Has a hard time with sleep.  Sometimes uses 1/2 benadryl and 1/2 flexeril some nights  No other aggravating or relieving factors.    No other c/o.  Past Medical History:  Diagnosis Date   Chronic pain in shoulder 2018   [facility]   H/O echocardiogram 10/08/2016   EF 65-70%, normal wall motion, systolic function vigorous   Hearing loss    pending hearing aids 06/2014    Hypertension    Lipoma    Varicose vein    Vestibular schwannoma (HCC) 04/2018   Current Outpatient Medications on File Prior to Visit  Medication Sig Dispense Refill   allopurinol (ZYLOPRIM) 100 MG tablet Take 1 tablet by mouth daily. Taking 1 in morning, 1/2 in afteroon     CRANBERRY PO Take by mouth.     magnesium oxide (MAG-OX) 400 MG tablet Take 400 mg by mouth 2 (two) times daily.     meclizine (ANTIVERT) 25 MG tablet Take 1 tablet (25 mg total) by mouth 3 (three) times daily as needed for dizziness. 30 tablet 0   meloxicam (MOBIC) 15 MG tablet Take 1 tablet (15 mg total) by mouth daily. 30 tablet 0   MILK THISTLE PO Take by mouth.     Multiple Vitamin (MULITIVITAMIN WITH MINERALS) TABS Take 1 tablet by mouth daily.     Oxycodone HCl 10 MG TABS Take 10 mg by mouth 5 (five) times daily.     predniSONE (DELTASONE) 20 MG tablet Take 20 mg by mouth 2 (two) times daily with a meal.     predniSONE (DELTASONE) 20 MG tablet 1 tablet daily for 5 days, then 1/2 tablet for 5 days 10 tablet 0   psyllium (REGULOID) 0.52 g capsule Take 0.52 g by mouth daily.  lisinopril (ZESTRIL) 10 MG tablet Take 10 mg by mouth daily. (Patient not taking: Reported on 08/13/2022)     metoprolol tartrate (LOPRESSOR) 100 MG tablet Take 1 tablet (100 mg total) by mouth 2 (two) times daily. (Patient not taking: Reported on 08/13/2022) 180 tablet 3   No current facility-administered medications on file prior to visit.     The following portions of the patient's history were reviewed and updated as appropriate: allergies, current medications, past family history, past medical history, past social history, past surgical history and problem list.  ROS Otherwise as in subjective above  Objective: BP 120/72   Pulse 62   Temp 97.7 F (36.5 C) (Oral)   Resp 16   Wt 140 lb 6.4 oz (63.7 kg)   SpO2 98% Comment: room air  BMI 21.67 kg/m   Wt Readings from Last 3 Encounters:  08/13/22 140 lb 6.4 oz (63.7 kg)   08/04/22 139 lb 6.4 oz (63.2 kg)  12/08/21 153 lb (69.4 kg)   BP Readings from Last 3 Encounters:  08/13/22 120/72  08/05/22 116/82  08/04/22 (!) 88/60    General appearance: alert, no distress, well developed, well nourished Neck: supple, no lymphadenopathy, no thyromegaly, no masses Heart: RRR, normal S1, S2, no murmurs Lungs: CTA bilaterally, no wheezes, rhonchi, or rales MSK: tender left medial wrist, quite decreased ROM of left wrist, otherwise hand and wrist and arm without tendnerss or swelling Arms and hands neurovascular intact Pulses: 2+ radial pulses, 2+ pedal pulses, normal cap refill Ext: no edema   Assessment: Encounter Diagnoses  Name Primary?   Other specified hypotension Yes   Hyperkalemia    Leukocytosis, unspecified type    Abnormal thyroid blood test    Essential hypertension    Dizziness    Other secondary chronic gout of multiple sites without tophus    Left arm pain    Decreased range of motion of left wrist      Plan: Fortunately BP is improved, dizziness resolved.  For now discontinue Lisinopril.  Continue lower dose Metoprolol 100mg , 1/2 tablet BID or 50mg  BID.  If BP starts to rise back over 130/80, then he can change back to Metoprolol 100mg  BID  Hyperkalemia on 08/04/22 - Updated labs today  Leukocytosis on 08/04/22.  Recheck labs today as he is improved and no significant infection symptoms today.  Abnormal thyroid labs 08/04/22 - recheck labs today  Hx/o gout - continue allopurinol daily, 100mg  in morning , 50mg  in afternoon, and can use some short term colchicine BID prn  Left arm and wrist pain, decrease wrist ROM - possible gout, vs tendinitis vs other.   Continue splint,finish out prednisone, can try a few days of colchicine.  If not improving in the next week, consider baseline let wrist xray    Dain was seen today for hypotension.  Diagnoses and all orders for this visit:  Other specified hypotension -     Comprehensive  metabolic panel  Hyperkalemia -     Comprehensive metabolic panel  Leukocytosis, unspecified type -     CBC with Differential/Platelet  Abnormal thyroid blood test -     TSH + free T4  Essential hypertension  Dizziness  Other secondary chronic gout of multiple sites without tophus  Left arm pain  Decreased range of motion of left wrist  Other orders -     colchicine 0.6 MG tablet; Take 1 tablet (0.6 mg total) by mouth 2 (two) times daily as needed.  Follow up: pending labs

## 2022-08-14 LAB — CBC WITH DIFFERENTIAL/PLATELET
Basophils Absolute: 0 x10E3/uL (ref 0.0–0.2)
Basos: 0 %
EOS (ABSOLUTE): 0.2 x10E3/uL (ref 0.0–0.4)
Eos: 2 %
Hematocrit: 43.9 % (ref 37.5–51.0)
Hemoglobin: 14.5 g/dL (ref 13.0–17.7)
Immature Grans (Abs): 0.1 x10E3/uL (ref 0.0–0.1)
Immature Granulocytes: 1 %
Lymphocytes Absolute: 1.1 x10E3/uL (ref 0.7–3.1)
Lymphs: 7 %
MCH: 28.4 pg (ref 26.6–33.0)
MCHC: 33 g/dL (ref 31.5–35.7)
MCV: 86 fL (ref 79–97)
Monocytes Absolute: 0.8 x10E3/uL (ref 0.1–0.9)
Monocytes: 6 %
Neutrophils Absolute: 12 x10E3/uL — ABNORMAL HIGH (ref 1.4–7.0)
Neutrophils: 84 %
Platelets: 208 x10E3/uL (ref 150–450)
RBC: 5.11 x10E6/uL (ref 4.14–5.80)
RDW: 12.9 % (ref 11.6–15.4)
WBC: 14.2 x10E3/uL — ABNORMAL HIGH (ref 3.4–10.8)

## 2022-08-14 LAB — COMPREHENSIVE METABOLIC PANEL WITH GFR
ALT: 17 IU/L (ref 0–44)
AST: 18 IU/L (ref 0–40)
Albumin/Globulin Ratio: 1.6 (ref 1.2–2.2)
Albumin: 4 g/dL (ref 3.8–4.8)
Alkaline Phosphatase: 86 IU/L (ref 44–121)
BUN/Creatinine Ratio: 22 (ref 10–24)
BUN: 28 mg/dL — ABNORMAL HIGH (ref 8–27)
Bilirubin Total: 0.3 mg/dL (ref 0.0–1.2)
CO2: 24 mmol/L (ref 20–29)
Calcium: 9.1 mg/dL (ref 8.6–10.2)
Chloride: 96 mmol/L (ref 96–106)
Creatinine, Ser: 1.28 mg/dL — ABNORMAL HIGH (ref 0.76–1.27)
Globulin, Total: 2.5 g/dL (ref 1.5–4.5)
Glucose: 124 mg/dL — ABNORMAL HIGH (ref 70–99)
Potassium: 4.6 mmol/L (ref 3.5–5.2)
Sodium: 136 mmol/L (ref 134–144)
Total Protein: 6.5 g/dL (ref 6.0–8.5)
eGFR: 60 mL/min/1.73

## 2022-08-14 LAB — TSH+FREE T4
Free T4: 1.48 ng/dL (ref 0.82–1.77)
TSH: 1.73 u[IU]/mL (ref 0.450–4.500)

## 2022-08-15 NOTE — Progress Notes (Signed)
Results sent through MyChart

## 2022-08-16 ENCOUNTER — Encounter: Payer: Self-pay | Admitting: Family Medicine

## 2022-08-16 ENCOUNTER — Other Ambulatory Visit: Payer: Self-pay | Admitting: Medical

## 2022-08-16 ENCOUNTER — Ambulatory Visit (INDEPENDENT_AMBULATORY_CARE_PROVIDER_SITE_OTHER): Payer: 59 | Admitting: Family Medicine

## 2022-08-16 VITALS — BP 100/68 | HR 64 | Temp 97.4°F | Ht 67.5 in | Wt 139.8 lb

## 2022-08-16 DIAGNOSIS — W57XXXA Bitten or stung by nonvenomous insect and other nonvenomous arthropods, initial encounter: Secondary | ICD-10-CM

## 2022-08-16 DIAGNOSIS — S30860A Insect bite (nonvenomous) of lower back and pelvis, initial encounter: Secondary | ICD-10-CM

## 2022-08-16 DIAGNOSIS — L0390 Cellulitis, unspecified: Secondary | ICD-10-CM | POA: Diagnosis not present

## 2022-08-16 DIAGNOSIS — R051 Acute cough: Secondary | ICD-10-CM | POA: Diagnosis not present

## 2022-08-16 MED ORDER — DOXYCYCLINE MONOHYDRATE 100 MG PO TABS: 100.0000 mg | ORAL_TABLET | Freq: Two times a day (BID) | ORAL | 0 refills | Status: DC

## 2022-08-16 MED ORDER — DOXYCYCLINE HYCLATE 100 MG PO TABS: 100.0000 mg | ORAL_TABLET | Freq: Two times a day (BID) | ORAL | 0 refills | Status: DC

## 2022-08-16 NOTE — Patient Instructions (Signed)
  Keep the areas clean with soap and water.   We are treating you for a skin infection.   The antibiotic also covers tick-borne illness (such as [facility] Spotted Fever and Lyme disease).  You do not have evidence of these illnesses, other than perhaps the low grade fever you had this morning, but that can also come from a simple skin infection. The antibiotic prescribed makes you sensitive to the sun and burn easily.  Please be sure to use sunscreen to all sun-exposed areas when outside, and reapply frequently throughout the day.  Return for re-check if you have persistent fevers, new symptoms, or increasing redness or pain. Body aches, other rashes, or other new symptoms should be re-evaluated.  Regarding your cough when laying down--this may be from either postnasal drainage or reflux. You can consider elevating the head of the bed, try taking an antihistamine at night, and/or taking your reflux medication either before dinner or before bedtime, if the cough persists.

## 2022-08-16 NOTE — Progress Notes (Signed)
Chief Complaint  Patient presents with   Tick Bites    Two tick bites last Thursday lower right back area. They have been removed. Area is red and he wants to make sure that there is no infection. He has had fever and cough since Friday he believes he related to the bite.    5/30 he moved a large brush pile.  He thinks that must be when he got the ticks.  On Friday, 5/31 he felt a bug bite on his back.  It felt larger on Saturday.  He isn't able to see it, grabbed a friend on Sunday and was told he had 2 ticks.  His friend removed them.  He reports it looks red. Denies itching or pain.  He noticed LG fever of 99, 100 this morning. Energy is lower than normal.  He notices a cough for the last 2-3 days, only when laying down. Cough is nonproductive. Denies any nasal congestion, allergy or cold symptoms. He reports having a medication for acid reflux at home (prescribed by another doctor), only uses it prn.  Uses it once or twice a month. Denies any current heartburn.   PMH, PSH, SH reviewed   Outpatient Encounter Medications as of 08/16/2022  Medication Sig Note   acetaminophen (TYLENOL) 500 MG tablet Take 1,000 mg by mouth every 6 (six) hours as needed. 08/16/2022: Last dose 8am   allopurinol (ZYLOPRIM) 100 MG tablet Take 1 tablet by mouth daily. Taking 1 in morning, 1/2 in afteroon    colchicine 0.6 MG tablet Take 1 tablet (0.6 mg total) by mouth 2 (two) times daily as needed.    CRANBERRY PO Take by mouth.    cyclobenzaprine (FLEXERIL) 10 MG tablet Take 1 tablet by mouth 3 (three) times daily. 08/16/2022: Took 1/2 last night (uses for sleep every night)   diphenhydrAMINE (BENADRYL) 25 MG tablet Take 25 mg by mouth every 6 (six) hours as needed. 08/16/2022: Took one last night(Takes one every night for sleep)   magnesium oxide (MAG-OX) 400 MG tablet Take 400 mg by mouth 2 (two) times daily.    metoprolol tartrate (LOPRESSOR) 100 MG tablet Take 1 tablet (100 mg total) by mouth 2 (two) times daily.     MILK THISTLE PO Take by mouth.    Multiple Vitamin (MULITIVITAMIN WITH MINERALS) TABS Take 1 tablet by mouth daily.    Oxycodone HCl 10 MG TABS Take 10 mg by mouth 5 (five) times daily.    predniSONE (DELTASONE) 20 MG tablet 1 tablet daily for 5 days, then 1/2 tablet for 5 days 08/16/2022: Taking 10mg  daily   psyllium (REGULOID) 0.52 g capsule Take 0.52 g by mouth daily.    [DISCONTINUED] lisinopril (ZESTRIL) 10 MG tablet Take 10 mg by mouth daily.    meclizine (ANTIVERT) 25 MG tablet Take 1 tablet (25 mg total) by mouth 3 (three) times daily as needed for dizziness. (Patient not taking: Reported on 08/16/2022) 08/16/2022: As needed, last dose yesterday   meloxicam (MOBIC) 15 MG tablet Take 1 tablet (15 mg total) by mouth daily. (Patient not taking: Reported on 08/16/2022)    [DISCONTINUED] predniSONE (DELTASONE) 20 MG tablet Take 20 mg by mouth 2 (two) times daily with a meal.    No facility-administered encounter medications on file as of 08/16/2022.   No Known Allergies  ROS:  no nausea, vomiting, abdominal pain, bowel changes. Bleeding, bruising. He has a lot of redness around the tick bite, no rash elsewhere on body. Chronic R shoulder  and back pain (reason for chronic pain meds).  Denies new pains. Cough per HPI. No chest pain, shortness of breath, heartburn.   PHYSICAL EXAM:  BP 100/68   Pulse 64   Temp (!) 97.4 F (36.3 C) (Tympanic)   Ht 5' 7.5" (1.715 m)   Wt 139 lb 12.8 oz (63.4 kg)   BMI 21.57 kg/m   Well-appearing, pleasant male, in no distress.  No sniffling, throat-clearing or coughing during visit HEENT: conjunctiva and sclera are clear, EOMI.  Nasal mucosa is without drainage or erythema.  OP is clear, no erythema or purulent drainage.  Sinuses are nontender. Neck: no lymphadenopathy or mass Heart: regular rate and rhythm Lungs: clear bilaterally Skin:  Right lower back-- 2 lesions that are scabbed and slightly ulcerated (medial lesion). These are 4cm apart from each  other. There is surrounding erythema around both lesions, in an oval shape, measuring  approximately 25 x 15mm in redness. This area is slightly indurated, no fluctuance. No streaks. Extremities: no edema Psych: normal mood, affect, hygiene and grooming Neuro: alert and oriented, cranial nerves grossly intact, normal gait.   ASSESSMENT/PLAN:  Cellulitis of skin - treat with doxy. f/u if persistent fever, increasing redness, pain, or other concerns - Plan: doxycycline (VIBRA-TABS) 100 MG tablet  Tick bite of lower back, initial encounter - nothing to suggest tick-borne illness, but doxy for cellulitis should cover. - Plan: doxycycline (VIBRA-TABS) 100 MG tablet  Acute cough - only when supine, recent onset.  Ddx reviewed, PND/allergies vs GERD. To restart reflux med he has, and antihistamine, elevated HOB. f/u if worse   Keep the areas clean with soap and water.   We are treating you for a skin infection.   The antibiotic also covers tick-borne illness (such as [facility] Spotted Fever and Lyme disease).  You do not have evidence of these illnesses, other than perhaps the low grade fever you had this morning, but that can also come from a simple skin infection. The antibiotic prescribed makes you sensitive to the sun and burn easily.  Please be sure to use sunscreen to all sun-exposed areas when outside, and reapply frequently throughout the day.  Return for re-check if you have persistent fevers, new symptoms, or increasing redness or pain. Body aches, other rashes, or other new symptoms should be re-evaluated.  Regarding your cough when laying down--this may be from either postnasal drainage or reflux. You can consider elevating the head of the bed, try taking an antihistamine at night, and/or taking your reflux medication either before dinner or before bedtime, if the cough persists.

## 2022-08-18 ENCOUNTER — Ambulatory Visit (INDEPENDENT_AMBULATORY_CARE_PROVIDER_SITE_OTHER): Payer: 59 | Admitting: Medical

## 2022-08-18 VITALS — BP 104/64 | HR 78 | Temp 97.9°F | Wt 138.2 lb

## 2022-08-18 DIAGNOSIS — J029 Acute pharyngitis, unspecified: Secondary | ICD-10-CM | POA: Diagnosis not present

## 2022-08-18 DIAGNOSIS — B370 Candidal stomatitis: Secondary | ICD-10-CM | POA: Diagnosis not present

## 2022-08-18 LAB — POCT WET PREP (WET MOUNT)
Clue Cells Wet Prep Whiff POC: NEGATIVE
Trichomonas Wet Prep HPF POC: ABSENT

## 2022-08-18 LAB — POCT RAPID STREP A (OFFICE): Rapid Strep A Screen: NEGATIVE

## 2022-08-18 MED ORDER — CLOTRIMAZOLE 10 MG MT TROC: 10.0000 mg | Freq: Every day | OROMUCOSAL | 0 refills | Status: DC

## 2022-08-18 NOTE — Progress Notes (Signed)
Subjective:  Logan Phillips is a 72 y.o. male who presents for Chief Complaint  Patient presents with   Sore Throat    Sore throat since last Friday. Just not getting better     Here for c/o sore throat, no ear pain, no cough. Top of mouth sore.   No runny nose, no sneezing.  No fever.  Sore throat started 4 days ago.  No sick contacts.  Using chloraseptic.  He was seen here 2 days ago for tick bite, on doxycycline already.  moved brush pile this past weekend, got tick bites.  No mono or strep contact.  No other aggravating or relieving factors.    No other c/o.  Past Medical History:  Diagnosis Date   Chronic pain in shoulder 2018   [facility]   H/O echocardiogram 10/08/2016   EF 65-70%, normal wall motion, systolic function vigorous   Hearing loss    pending hearing aids 06/2014   Hypertension    Lipoma    Varicose vein    Vestibular schwannoma (HCC) 04/2018     The following portions of the patient's history were reviewed and updated as appropriate: allergies, current medications, past family history, past medical history, past social history, past surgical history and problem list.  ROS Otherwise as in subjective above  Objective: BP 104/64   Pulse 78   Temp 97.9 F (36.6 C)   Wt 138 lb 3.2 oz (62.7 kg)   BMI 21.33 kg/m   BP Readings from Last 3 Encounters:  08/18/22 104/64  08/16/22 100/68  08/13/22 120/72    General appearance: alert, no distress, well developed, well nourished HEENT: normocephalic, sclerae anicteric, conjunctiva pink and moist, TMs pearly, nares patent, no discharge or erythema, pharynx with some post nasal drainage, no obvious thrust, no exudate, mild erythema Oral cavity: somewhat of a whitish film on tongue, MMM, several areas of tooth decay Neck: supple, slight tendnerss anterior nodes, otherwise no lymphadenopathy, no thyromegaly, no masses Lungs: CTA bilaterally, no wheezes, rhonchi, or rales    Assessment: Encounter  Diagnoses  Name Primary?   Sore throat Yes   Thrush      Plan: Strep negative KOH wet prep with few yeasts  Given recent prednisone use, this may have contributed to immune changes.  Begin medicaiton below, good hydration.  F/u if not much improved within a week.  Logan Phillips was seen today for sore throat.  Diagnoses and all orders for this visit:  Sore throat -     POCT Wet Prep [facility]) -     Rapid Strep A  Thrush  Other orders -     clotrimazole (MYCELEX) 10 MG troche; Take 1 tablet (10 mg total) by mouth 5 (five) times daily.    Follow up: prn

## 2022-08-23 ENCOUNTER — Other Ambulatory Visit: Payer: Self-pay | Admitting: Medical

## 2022-08-23 ENCOUNTER — Ambulatory Visit (INDEPENDENT_AMBULATORY_CARE_PROVIDER_SITE_OTHER): Payer: 59 | Admitting: Medical

## 2022-08-23 VITALS — BP 118/72 | HR 81 | Temp 97.8°F | Wt 137.6 lb

## 2022-08-23 DIAGNOSIS — I2510 Atherosclerotic heart disease of native coronary artery without angina pectoris: Secondary | ICD-10-CM

## 2022-08-23 DIAGNOSIS — R42 Dizziness and giddiness: Secondary | ICD-10-CM

## 2022-08-23 DIAGNOSIS — R031 Nonspecific low blood-pressure reading: Secondary | ICD-10-CM

## 2022-08-23 DIAGNOSIS — D72829 Elevated white blood cell count, unspecified: Secondary | ICD-10-CM

## 2022-08-23 DIAGNOSIS — R5383 Other fatigue: Secondary | ICD-10-CM | POA: Diagnosis not present

## 2022-08-23 DIAGNOSIS — I10 Essential (primary) hypertension: Secondary | ICD-10-CM | POA: Diagnosis not present

## 2022-08-23 LAB — CBC WITH DIFFERENTIAL/PLATELET
Eos: 5 %
Hemoglobin: 13.6 g/dL (ref 13.0–17.7)
Immature Grans (Abs): 0.1 10*3/uL (ref 0.0–0.1)
Immature Granulocytes: 1 %
MCH: 29.3 pg (ref 26.6–33.0)
Neutrophils Absolute: 3 10*3/uL (ref 1.4–7.0)
Neutrophils: 63 %

## 2022-08-23 MED ORDER — METOPROLOL SUCCINATE ER 25 MG PO TB24: 25.0000 mg | ORAL_TABLET | Freq: Every day | ORAL | 1 refills | Status: DC

## 2022-08-23 NOTE — Progress Notes (Signed)
Subjective:  Logan Phillips is a 72 y.o. male who presents for Chief Complaint  Patient presents with   Consult    Not feeling well, no energy, still dizziness going on,      Here for not feeling well.  Went to play golf last week, no energy, but no other symptoms.  Using 50mg  of metoprolol BID.  Compliant with other medicaiton as usual.  No chest pain, no sob, no diaphoresis, no swelling.  Walks several miles daily.  Drinks close to a gallon of water daily  No fever, no URI symptoms, no body aches or chills.    No prior eval for low testosterone.  From visit last week for thrush, mostly resolved on the troches.    No other aggravating or relieving factors.    No other c/o.  Past Medical History:  Diagnosis Date   Chronic pain in shoulder 2018   [facility]   H/O echocardiogram 10/08/2016   EF 65-70%, normal wall motion, systolic function vigorous   Hearing loss    pending hearing aids 06/2014   Hypertension    Lipoma    Varicose vein    Vestibular schwannoma (HCC) 04/2018   Current Outpatient Medications on File Prior to Visit  Medication Sig Dispense Refill   acetaminophen (TYLENOL) 500 MG tablet Take 1,000 mg by mouth every 6 (six) hours as needed.     clotrimazole (MYCELEX) 10 MG troche Take 1 tablet (10 mg total) by mouth 5 (five) times daily. 30 Troche 0   colchicine 0.6 MG tablet Take 1 tablet (0.6 mg total) by mouth 2 (two) times daily as needed. 60 tablet 0   CRANBERRY PO Take by mouth.     cyclobenzaprine (FLEXERIL) 10 MG tablet Take 1 tablet by mouth 3 (three) times daily.     diphenhydrAMINE (BENADRYL) 25 MG tablet Take 25 mg by mouth every 6 (six) hours as needed.     doxycycline (VIBRA-TABS) 100 MG tablet Take 1 tablet (100 mg total) by mouth 2 (two) times daily. 20 tablet 0   magnesium oxide (MAG-OX) 400 MG tablet Take 400 mg by mouth 2 (two) times daily.     meclizine (ANTIVERT) 25 MG tablet Take 1 tablet (25 mg total) by mouth 3 (three) times  daily as needed for dizziness. 30 tablet 0   MILK THISTLE PO Take by mouth.     Multiple Vitamin (MULITIVITAMIN WITH MINERALS) TABS Take 1 tablet by mouth daily.     Oxycodone HCl 10 MG TABS Take 10 mg by mouth 5 (five) times daily.     psyllium (REGULOID) 0.52 g capsule Take 0.52 g by mouth daily.     allopurinol (ZYLOPRIM) 100 MG tablet Take 1 tablet by mouth daily. Taking 1 in morning, 1/2 in afteroon (Patient not taking: Reported on 08/23/2022)     meloxicam (MOBIC) 15 MG tablet Take 1 tablet (15 mg total) by mouth daily. (Patient not taking: Reported on 08/23/2022) 30 tablet 0   No current facility-administered medications on file prior to visit.    The following portions of the patient's history were reviewed and updated as appropriate: allergies, current medications, past family history, past medical history, past social history, past surgical history and problem list.  ROS Otherwise as in subjective above  Objective: BP 118/72 Comment: rt. arm  Pulse 81   Temp 97.8 F (36.6 C)   Wt 137 lb 9.6 oz (62.4 kg)   SpO2 96%   BMI 21.23 kg/m  Wt Readings from Last 3 Encounters:  08/23/22 137 lb 9.6 oz (62.4 kg)  08/18/22 138 lb 3.2 oz (62.7 kg)  08/16/22 139 lb 12.8 oz (63.4 kg)   BP Readings from Last 3 Encounters:  08/23/22 118/72  08/18/22 104/64  08/16/22 100/68    General appearance: alert, no distress, well developed, well nourished HEENT: normocephalic, sclerae anicteric, conjunctiva pink and moist, TMs pearly, nares patent, no discharge or erythema, pharynx normal Oral cavity: MMM, no lesions Neck: supple, no lymphadenopathy, no thyromegaly, no masses Heart: RRR, normal S1, S2, no murmurs Lungs: CTA bilaterally, no wheezes, rhonchi, or rales Pulses: 2+ radial pulses, 2+ pedal pulses, normal cap refill Ext: no edema  EKG abnormal but no change from 2023 EKG showing T wave inversions in precordial leads  Assessment: Encounter Diagnoses  Name Primary?   Fatigue,  unspecified type Yes   Low blood pressure reading    Essential hypertension    Coronary artery disease involving native coronary artery of native heart without angina pectoris    Dizziness    Leukocytosis, unspecified type      Plan: We discussed his symptoms, concerns, possible differential.  EKG reviewed.  I readings on blood pressures from left and right arm were similar.  He may just be still overmedicated with the metoprolol.  Drop down to 25 mg Toprol.  Continue to hydrate well.  Limit alcohol.  Given recent leukocytosis, recheck CBC today.  Will likely refer for echocardiogram pending labs  Rolen was seen today for consult.  Diagnoses and all orders for this visit:  Fatigue, unspecified type -     CBC with Differential/Platelet -     Testosterone -     EKG 12-Lead  Low blood pressure reading -     EKG 12-Lead  Essential hypertension  Coronary artery disease involving native coronary artery of native heart without angina pectoris  Dizziness  Leukocytosis, unspecified type -     CBC with Differential/Platelet  Other orders -     Discontinue: metoprolol succinate (TOPROL-XL) 25 MG 24 hr tablet; Take 1 tablet (25 mg total) by mouth daily. -     metoprolol succinate (TOPROL-XL) 25 MG 24 hr tablet; Take 1 tablet (25 mg total) by mouth daily.    Follow up: pending labs

## 2022-08-24 ENCOUNTER — Other Ambulatory Visit: Payer: Self-pay | Admitting: Medical

## 2022-08-24 DIAGNOSIS — R5383 Other fatigue: Secondary | ICD-10-CM

## 2022-08-24 DIAGNOSIS — R7989 Other specified abnormal findings of blood chemistry: Secondary | ICD-10-CM

## 2022-08-24 LAB — CBC WITH DIFFERENTIAL/PLATELET
Basophils Absolute: 0 10*3/uL (ref 0.0–0.2)
Basos: 1 %
EOS (ABSOLUTE): 0.2 10*3/uL (ref 0.0–0.4)
Hematocrit: 39.3 % (ref 37.5–51.0)
Lymphocytes Absolute: 1 10*3/uL (ref 0.7–3.1)
Lymphs: 22 %
MCHC: 34.6 g/dL (ref 31.5–35.7)
MCV: 85 fL (ref 79–97)
Monocytes Absolute: 0.4 10*3/uL (ref 0.1–0.9)
Monocytes: 8 %
Platelets: 209 10*3/uL (ref 150–450)
RBC: 4.64 x10E6/uL (ref 4.14–5.80)
RDW: 12.7 % (ref 11.6–15.4)
WBC: 4.8 10*3/uL (ref 3.4–10.8)

## 2022-08-24 LAB — TESTOSTERONE: Testosterone: 96 ng/dL — ABNORMAL LOW (ref 264–916)

## 2022-08-24 NOTE — Progress Notes (Signed)
Your labs show quite low testosterone hormone.  The white counts are back to normal thankfully.  As we discussed yesterday changed to the lower dose of Toprol if your blood pressures come back up to the range we discussed.  If blood pressures are not going over 120/70 then hold off on blood pressure medicine in general, but continue to monitor your blood pressure  Regarding energy levels and testosterone, we could potentially pursue further evaluation and treatment options for low testosterone.  Have you been on low testosterone therapy in the past?  If not, there are a few other labs we have to do before we can initiate testosterone therapy

## 2022-08-25 ENCOUNTER — Encounter: Payer: Self-pay | Admitting: *Deleted

## 2022-08-27 ENCOUNTER — Telehealth (HOSPITAL_BASED_OUTPATIENT_CLINIC_OR_DEPARTMENT_OTHER): Payer: Self-pay | Admitting: *Deleted

## 2022-08-27 ENCOUNTER — Ambulatory Visit (INDEPENDENT_AMBULATORY_CARE_PROVIDER_SITE_OTHER): Payer: 59 | Admitting: Medical

## 2022-08-27 VITALS — BP 104/66 | HR 94 | Wt 136.0 lb

## 2022-08-27 DIAGNOSIS — R5383 Other fatigue: Secondary | ICD-10-CM | POA: Diagnosis not present

## 2022-08-27 DIAGNOSIS — I10 Essential (primary) hypertension: Secondary | ICD-10-CM | POA: Diagnosis not present

## 2022-08-27 DIAGNOSIS — R7989 Other specified abnormal findings of blood chemistry: Secondary | ICD-10-CM | POA: Diagnosis not present

## 2022-08-27 DIAGNOSIS — I2510 Atherosclerotic heart disease of native coronary artery without angina pectoris: Secondary | ICD-10-CM

## 2022-08-27 NOTE — Patient Instructions (Addendum)
Blood pressures Since your blood pressures continue to run low lets discontinue Toprol  Monitor blood pressures at home occasionally As long as you are less than 130/80 we would not restart medication at this time If you start seeing either top number over 130 or bottom number over 90, then let me know so we can decide on medication options I have placed an order for echocardiogram ultrasound the heart.  Expect a phone call about this Follow-up with your neurosurgeon in July as planned for brain scan If your testosterone is low but the other 2 labs are normal and we will likely call out Jatenzo testosterone as a trial You have some mild cholesterol plaques on a prior scan last year showing some atherosclerosis in your coronary arteries.  The recommendation would be low-cholesterol diet and to begin a medication such as Crestor to lower your risk of heart disease and stroke.  Let me know if you are agreeable to this

## 2022-08-27 NOTE — Telephone Encounter (Signed)
Left message for patient to call and discuss scheduling the Echocardiogram ordered by Crosby Oyster, PA

## 2022-08-27 NOTE — Progress Notes (Signed)
Subjective:  Logan Phillips is a 72 y.o. male who presents for Chief Complaint  Patient presents with   Consult    Testosterone therapy     Here to discuss testosterone.  On recent visit he reported significant fatigue.  Labs showed very low testosterone.   He came in last week for fatigue, ongoing low blood pressure readings and other symptoms.  We had him decrease his Toprol to 25 mg last week given he continues to run low normal blood pressure readings.  Sees brain surgeon in July for repeat surveillance brain scan due to history of brain tumor.    No other aggravating or relieving factors.    No other c/o.  Past Medical History:  Diagnosis Date   Chronic pain in shoulder 2018   [facility]   H/O echocardiogram 10/08/2016   EF 65-70%, normal wall motion, systolic function vigorous   Hearing loss    pending hearing aids 06/2014   Hypertension    Lipoma    Varicose vein    Vestibular schwannoma (HCC) 04/2018   Current Outpatient Medications on File Prior to Visit  Medication Sig Dispense Refill   acetaminophen (TYLENOL) 500 MG tablet Take 1,000 mg by mouth every 6 (six) hours as needed.     allopurinol (ZYLOPRIM) 100 MG tablet Take 1 tablet by mouth daily. Taking 1 in morning, 1/2 in afteroon     clotrimazole (MYCELEX) 10 MG troche Take 1 tablet (10 mg total) by mouth 5 (five) times daily. 30 Troche 0   colchicine 0.6 MG tablet Take 1 tablet (0.6 mg total) by mouth 2 (two) times daily as needed. 60 tablet 0   CRANBERRY PO Take by mouth.     cyclobenzaprine (FLEXERIL) 10 MG tablet Take 1 tablet by mouth 3 (three) times daily.     diphenhydrAMINE (BENADRYL) 25 MG tablet Take 25 mg by mouth every 6 (six) hours as needed.     doxycycline (VIBRA-TABS) 100 MG tablet Take 1 tablet (100 mg total) by mouth 2 (two) times daily. 20 tablet 0   magnesium oxide (MAG-OX) 400 MG tablet Take 400 mg by mouth 2 (two) times daily.     meclizine (ANTIVERT) 25 MG tablet Take 1 tablet  (25 mg total) by mouth 3 (three) times daily as needed for dizziness. 30 tablet 0   meloxicam (MOBIC) 15 MG tablet Take 1 tablet (15 mg total) by mouth daily. 30 tablet 0   metoprolol succinate (TOPROL-XL) 25 MG 24 hr tablet TAKE 1 TABLET(25 MG) BY MOUTH DAILY 90 tablet 0   MILK THISTLE PO Take by mouth.     Multiple Vitamin (MULITIVITAMIN WITH MINERALS) TABS Take 1 tablet by mouth daily.     Oxycodone HCl 10 MG TABS Take 10 mg by mouth 5 (five) times daily.     psyllium (REGULOID) 0.52 g capsule Take 0.52 g by mouth daily.     No current facility-administered medications on file prior to visit.     The following portions of the patient's history were reviewed and updated as appropriate: allergies, current medications, past family history, past medical history, past social history, past surgical history and problem list.  ROS Otherwise as in subjective above    Objective: BP 104/66 Comment: 110/74  Pulse 94   Wt 136 lb (61.7 kg)   BMI 20.99 kg/m   General appearance: alert, no distress, well developed, well nourished Neck: supple, no lymphadenopathy, no thyromegaly, no masses Heart: RRR, normal S1, S2, no  murmurs Lungs: CTA bilaterally, no wheezes, rhonchi, or rales Pulses: 2+ radial pulses, 2+ pedal pulses, normal cap refill Ext: no edema   Assessment: Encounter Diagnoses  Name Primary?   Low testosterone Yes   Other fatigue    Essential hypertension    Coronary artery disease involving native coronary artery of native heart without angina pectoris      Plan: Low testosterone, fatigue-we discussed his recent low testosterone results.  We discussed potential causes of low testosterone.  We discussed other evaluation before we could pursue testosterone therapy.  We discussed potential medications, and he would prefer oral Jatenzo or topical gel.  Hypertension-at his visit last week his blood pressures remain low and he was still having some fatigue and dizziness.  We  decreased his Toprol even more.  Today we will discontinue Toprol due to low readings.  He will continue to monitor BP at home.  Referral for echo.  Of note he had mild coronary artery disease calcifications on 2023 CT chest.  We discussed heart disease risk, low-cholesterol diet, medication to help reduce risk of cardiac event.  Advised statin and low-cholesterol diet.  He will consider  [place] was seen today for consult.  Diagnoses and all orders for this visit:  Low testosterone -     Testosterone -     FSH/LH -     Prolactin  Other fatigue -     Testosterone -     FSH/LH -     Prolactin  Essential hypertension -     ECHOCARDIOGRAM COMPLETE; Future  Coronary artery disease involving native coronary artery of native heart without angina pectoris -     ECHOCARDIOGRAM COMPLETE; Future    Follow up: pending labs, Echo

## 2022-08-28 LAB — TESTOSTERONE: Testosterone: 260 ng/dL — ABNORMAL LOW (ref 264–916)

## 2022-08-28 LAB — FSH/LH
FSH: 8.7 m[IU]/mL (ref 1.5–12.4)
LH: 9.7 m[IU]/mL — ABNORMAL HIGH (ref 1.7–8.6)

## 2022-08-28 LAB — PROLACTIN: Prolactin: 11.7 ng/mL (ref 3.6–25.2)

## 2022-08-31 NOTE — Telephone Encounter (Signed)
Left message for patient to call and discuss scheduling the Echocardiogram ordered by Crosby Oyster, PA

## 2022-09-05 NOTE — Progress Notes (Signed)
Your repeat testosterone was much better but still technically low.  The other labs are not worrisome.  Continue plan for repeat brain scans you have scheduled through your specialist  We can go ahead and do a trial of testosterone therapy if you would like.  Let me know if you want to start with the topical gel as we discussed or trying to get the oral medication approved?

## 2022-09-07 NOTE — Telephone Encounter (Signed)
Pt called Sabrina back, I tried to advise him of messages in his lab results but he stopped responding on the phone so I hung up and called back and I got no answer. Did not leave vm because I thought he would call back.

## 2022-09-08 ENCOUNTER — Other Ambulatory Visit: Payer: Self-pay | Admitting: Medical

## 2022-09-08 ENCOUNTER — Telehealth: Payer: Self-pay | Admitting: Medical

## 2022-09-08 MED ORDER — JATENZO 237 MG PO CAPS: 1.0000 | ORAL_CAPSULE | Freq: Two times a day (BID) | ORAL | 1 refills | Status: DC

## 2022-09-08 NOTE — Telephone Encounter (Signed)
Refill request last apt 08/27/22. 

## 2022-09-08 NOTE — Telephone Encounter (Signed)
Pt called back about testosterone, he wants to try oral

## 2022-09-09 NOTE — Telephone Encounter (Signed)
Left message for pt to call me back 

## 2022-09-21 ENCOUNTER — Ambulatory Visit (INDEPENDENT_AMBULATORY_CARE_PROVIDER_SITE_OTHER): Payer: 59

## 2022-09-21 VITALS — Ht 67.0 in | Wt 140.0 lb

## 2022-09-21 DIAGNOSIS — Z0000 Encounter for general adult medical examination without abnormal findings: Secondary | ICD-10-CM

## 2022-09-21 NOTE — Patient Instructions (Signed)
Logan Phillips , Thank you for taking time to come for your Medicare Wellness Visit. I appreciate your ongoing commitment to your health goals. Please review the following plan we discussed and let me know if I can assist you in the future.   These are the goals we discussed:  Goals      Patient Stated     09/21/2022, denies goal at this time        This is a list of the screening recommended for you and due dates:  Health Maintenance  Topic Date Due   COVID-19 Vaccine (1) Never done   Flu Shot  10/14/2022   Medicare Annual Wellness Visit  09/21/2023   DTaP/Tdap/Td vaccine (2 - Td or Tdap) 07/07/2024   Colon Cancer Screening  04/12/2028   Pneumonia Vaccine  Completed   Hepatitis C Screening  Completed   Zoster (Shingles) Vaccine  Completed   HPV Vaccine  Aged Out    Advanced directives: Advance directive discussed with you today.   Conditions/risks identified: none  Next appointment: Follow up in one year for your annual wellness visit.   Preventive Care 28 Years and Older, Male  Preventive care refers to lifestyle choices and visits with your health care provider that can promote health and wellness. What does preventive care include? A yearly physical exam. This is also called an annual well check. Dental exams once or twice a year. Routine eye exams. Ask your health care provider how often you should have your eyes checked. Personal lifestyle choices, including: Daily care of your teeth and gums. Regular physical activity. Eating a healthy diet. Avoiding tobacco and drug use. Limiting alcohol use. Practicing safe sex. Taking low doses of aspirin every day. Taking vitamin and mineral supplements as recommended by your health care provider. What happens during an annual well check? The services and screenings done by your health care provider during your annual well check will depend on your age, overall health, lifestyle risk factors, and family history of  disease. Counseling  Your health care provider may ask you questions about your: Alcohol use. Tobacco use. Drug use. Emotional well-being. Home and relationship well-being. Sexual activity. Eating habits. History of falls. Memory and ability to understand (cognition). Work and work Astronomer. Screening  You may have the following tests or measurements: Height, weight, and BMI. Blood pressure. Lipid and cholesterol levels. These may be checked every 5 years, or more frequently if you are over 85 years old. Skin check. Lung cancer screening. You may have this screening every year starting at age 12 if you have a 30-pack-year history of smoking and currently smoke or have quit within the past 15 years. Fecal occult blood test (FOBT) of the stool. You may have this test every year starting at age 35. Flexible sigmoidoscopy or colonoscopy. You may have a sigmoidoscopy every 5 years or a colonoscopy every 10 years starting at age 22. Prostate cancer screening. Recommendations will vary depending on your family history and other risks. Hepatitis C blood test. Hepatitis B blood test. Sexually transmitted disease (STD) testing. Diabetes screening. This is done by checking your blood sugar (glucose) after you have not eaten for a while (fasting). You may have this done every 1-3 years. Abdominal aortic aneurysm (AAA) screening. You may need this if you are a current or former smoker. Osteoporosis. You may be screened starting at age 43 if you are at high risk. Talk with your health care provider about your test results, treatment options, and  if necessary, the need for more tests. Vaccines  Your health care provider may recommend certain vaccines, such as: Influenza vaccine. This is recommended every year. Tetanus, diphtheria, and acellular pertussis (Tdap, Td) vaccine. You may need a Td booster every 10 years. Zoster vaccine. You may need this after age 25. Pneumococcal 13-valent  conjugate (PCV13) vaccine. One dose is recommended after age 40. Pneumococcal polysaccharide (PPSV23) vaccine. One dose is recommended after age 63. Talk to your health care provider about which screenings and vaccines you need and how often you need them. This information is not intended to replace advice given to you by your health care provider. Make sure you discuss any questions you have with your health care provider. Document Released: 03/28/2015 Document Revised: 11/19/2015 Document Reviewed: 12/31/2014 Elsevier Interactive Patient Education  2017 [employer].  Fall Prevention in the Home Falls can cause injuries. They can happen to people of all ages. There are many things you can do to make your home safe and to help prevent falls. What can I do on the outside of my home? Regularly fix the edges of walkways and driveways and fix any cracks. Remove anything that might make you trip as you walk through a door, such as a raised step or threshold. Trim any bushes or trees on the path to your home. Use bright outdoor lighting. Clear any walking paths of anything that might make someone trip, such as rocks or tools. Regularly check to see if handrails are loose or broken. Make sure that both sides of any steps have handrails. Any raised decks and porches should have guardrails on the edges. Have any leaves, snow, or ice cleared regularly. Use sand or salt on walking paths during winter. Clean up any spills in your garage right away. This includes oil or grease spills. What can I do in the bathroom? Use night lights. Install grab bars by the toilet and in the tub and shower. Do not use towel bars as grab bars. Use non-skid mats or decals in the tub or shower. If you need to sit down in the shower, use a plastic, non-slip stool. Keep the floor dry. Clean up any water that spills on the floor as soon as it happens. Remove soap buildup in the tub or shower regularly. Attach bath mats  securely with double-sided non-slip rug tape. Do not have throw rugs and other things on the floor that can make you trip. What can I do in the bedroom? Use night lights. Make sure that you have a light by your bed that is easy to reach. Do not use any sheets or blankets that are too big for your bed. They should not hang down onto the floor. Have a firm chair that has side arms. You can use this for support while you get dressed. Do not have throw rugs and other things on the floor that can make you trip. What can I do in the kitchen? Clean up any spills right away. Avoid walking on wet floors. Keep items that you use a lot in easy-to-reach places. If you need to reach something above you, use a strong step stool that has a grab bar. Keep electrical cords out of the way. Do not use floor polish or wax that makes floors slippery. If you must use wax, use non-skid floor wax. Do not have throw rugs and other things on the floor that can make you trip. What can I do with my stairs? Do not leave any  items on the stairs. Make sure that there are handrails on both sides of the stairs and use them. Fix handrails that are broken or loose. Make sure that handrails are as long as the stairways. Check any carpeting to make sure that it is firmly attached to the stairs. Fix any carpet that is loose or worn. Avoid having throw rugs at the top or bottom of the stairs. If you do have throw rugs, attach them to the floor with carpet tape. Make sure that you have a light switch at the top of the stairs and the bottom of the stairs. If you do not have them, ask someone to add them for you. What else can I do to help prevent falls? Wear shoes that: Do not have high heels. Have rubber bottoms. Are comfortable and fit you well. Are closed at the toe. Do not wear sandals. If you use a stepladder: Make sure that it is fully opened. Do not climb a closed stepladder. Make sure that both sides of the stepladder  are locked into place. Ask someone to hold it for you, if possible. Clearly mark and make sure that you can see: Any grab bars or handrails. First and last steps. Where the edge of each step is. Use tools that help you move around (mobility aids) if they are needed. These include: Canes. Walkers. Scooters. Crutches. Turn on the lights when you go into a dark area. Replace any light bulbs as soon as they burn out. Set up your furniture so you have a clear path. Avoid moving your furniture around. If any of your floors are uneven, fix them. If there are any pets around you, be aware of where they are. Review your medicines with your doctor. Some medicines can make you feel dizzy. This can increase your chance of falling. Ask your doctor what other things that you can do to help prevent falls. This information is not intended to replace advice given to you by your health care provider. Make sure you discuss any questions you have with your health care provider. Document Released: 12/26/2008 Document Revised: 08/07/2015 Document Reviewed: 04/05/2014 Elsevier Interactive Patient Education  2017 [employer] Managing Pain Without Opioids Opioids are strong medicines used to treat moderate to severe pain. For some people, especially those who have long-term (chronic) pain, opioids may not be the best choice for pain management due to: Side effects like nausea, constipation, and sleepiness. The risk of addiction (opioid use disorder). The longer you take opioids, the greater your risk of addiction. Pain that lasts for more than 3 months is called chronic pain. Managing chronic pain usually requires more than one approach and is often provided by a team of health care providers working together (multidisciplinary approach). Pain management may be done at a pain management center or pain clinic. How to manage pain without the use of opioids Use non-opioid medicines Non-opioid medicines for pain  may include: Over-the-counter or prescription non-steroidal anti-inflammatory drugs (NSAIDs). These may be the first medicines used for pain. They work well for muscle and bone pain, and they reduce swelling. Acetaminophen. This over-the-counter medicine may work well for milder pain but not swelling. Antidepressants. These may be used to treat chronic pain. A certain type of antidepressant (tricyclics) is often used. These medicines are given in lower doses for pain than when used for depression. Anticonvulsants. These are usually used to treat seizures but may also reduce nerve (neuropathic) pain. Muscle relaxants. These relieve pain caused by sudden  muscle tightening (spasms). You may also use a pain medicine that is applied to the skin as a patch, cream, or gel (topical analgesic), such as a numbing medicine. These may cause fewer side effects than medicines taken by mouth. Do certain therapies as directed Some therapies can help with pain management. They include: Physical therapy. You will do exercises to gain strength and flexibility. A physical therapist may teach you exercises to move and stretch parts of your body that are weak, stiff, or painful. You can learn these exercises at physical therapy visits and practice them at home. Physical therapy may also involve: Massage. Heat wraps or applying heat or cold to affected areas. Electrical signals that interrupt pain signals (transcutaneous electrical nerve stimulation, TENS). Weak lasers that reduce pain and swelling (low-level laser therapy). Signals from your body that help you learn to regulate pain (biofeedback). Occupational therapy. This helps you to learn ways to function at home and work with less pain. Recreational therapy. This involves trying new activities or hobbies, such as a physical activity or drawing. Mental health therapy, including: Cognitive behavioral therapy (CBT). This helps you learn coping skills for dealing with  pain. Acceptance and commitment therapy (ACT) to change the way you think and react to pain. Relaxation therapies, including muscle relaxation exercises and mindfulness-based stress reduction. Pain management counseling. This may be individual, family, or group counseling.  Receive medical treatments Medical treatments for pain management include: Nerve block injections. These may include a pain blocker and anti-inflammatory medicines. You may have injections: Near the spine to relieve chronic back or neck pain. Into joints to relieve back or joint pain. Into nerve areas that supply a painful area to relieve body pain. Into muscles (trigger point injections) to relieve some painful muscle conditions. A medical device placed near your spine to help block pain signals and relieve nerve pain or chronic back pain (spinal cord stimulation device). Acupuncture. Follow these instructions at home Medicines Take over-the-counter and prescription medicines only as told by your health care provider. If you are taking pain medicine, ask your health care providers about possible side effects to watch out for. Do not drive or use heavy machinery while taking prescription opioid pain medicine. Lifestyle  Do not use drugs or alcohol to reduce pain. If you drink alcohol, limit how much you have to: 0-1 drink a day for women who are not pregnant. 0-2 drinks a day for men. Know how much alcohol is in a drink. In the [place] one drink equals one 12 oz bottle of beer (355 mL), one 5 oz glass of wine (148 mL), or one 1 oz glass of hard liquor (44 mL). Do not use any products that contain nicotine or tobacco. These products include cigarettes, chewing tobacco, and vaping devices, such as e-cigarettes. If you need help quitting, ask your health care provider. Eat a healthy diet and maintain a healthy weight. Poor diet and excess weight may make pain worse. Eat foods that are high in fiber. These include fresh  fruits and vegetables, whole grains, and beans. Limit foods that are high in fat and processed sugars, such as fried and sweet foods. Exercise regularly. Exercise lowers stress and may help relieve pain. Ask your health care provider what activities and exercises are safe for you. If your health care provider approves, join an exercise class that combines movement and stress reduction. Examples include yoga and tai chi. Get enough sleep. Lack of sleep may make pain worse. Lower stress as much  as possible. Practice stress reduction techniques as told by your therapist. General instructions Work with all your pain management providers to find the treatments that work best for you. You are an important member of your pain management team. There are many things you can do to reduce pain on your own. Consider joining an online or in-person support group for people who have chronic pain. Keep all follow-up visits. This is important. Where to find more information You can find more information about managing pain without opioids from: American Academy of Pain Medicine: painmed.org Institute for Chronic Pain: instituteforchronicpain.org American Chronic Pain Association: theacpa.org Contact a health care provider if: You have side effects from pain medicine. Your pain gets worse or does not get better with treatments or home therapy. You are struggling with anxiety or depression. Summary Many types of pain can be managed without opioids. Chronic pain may respond better to pain management without opioids. Pain is best managed when you and a team of health care providers work together. Pain management without opioids may include non-opioid medicines, medical treatments, physical therapy, mental health therapy, and lifestyle changes. Tell your health care providers if your pain gets worse or is not being managed well enough. This information is not intended to replace advice given to you by your health  care provider. Make sure you discuss any questions you have with your health care provider. Document Revised: 06/11/2020 Document Reviewed: 06/11/2020 Elsevier Patient Education  2024 [employer].

## 2022-09-21 NOTE — Progress Notes (Signed)
Subjective:   Logan Phillips is a 72 y.o. male who presents for Medicare Annual/Subsequent preventive examination.  Visit Complete: Virtual  I connected with  Ronne Binning on 09/21/22 by a audio enabled telemedicine application and verified that I am speaking with the correct person using two identifiers.  Patient Location: Home  Provider Location: Office/Clinic  I discussed the limitations of evaluation and management by telemedicine. The patient expressed understanding and agreed to proceed.    Review of Systems     Cardiac Risk Factors include: advanced age (>23men, >61 women);hypertension;male gender     Objective:    Today's Vitals   09/21/22 0856  Weight: 140 lb (63.5 kg)  Height: 5\' 7"  (1.702 m)  PainSc: 5    Body mass index is 21.93 kg/m.     09/21/2022    9:03 AM 12/08/2021   10:00 PM 01/23/2019    9:39 PM 09/08/2018    8:28 PM 05/09/2018    5:02 PM 05/03/2018    8:58 AM 04/10/2018    5:47 PM  Advanced Directives  Does Patient Have a Medical Advance Directive? No No No No No No No  Would patient like information on creating a medical advance directive?  No - Patient declined   No - Patient declined No - Patient declined No - Patient declined    Current Medications (verified) Outpatient Encounter Medications as of 09/21/2022  Medication Sig   acetaminophen (TYLENOL) 500 MG tablet Take 1,000 mg by mouth every 6 (six) hours as needed.   allopurinol (ZYLOPRIM) 100 MG tablet Take 1 tablet by mouth daily. Taking 1 in morning, 1/2 in afteroon   colchicine 0.6 MG tablet Take 1 tablet (0.6 mg total) by mouth 2 (two) times daily as needed.   CRANBERRY PO Take by mouth.   cyclobenzaprine (FLEXERIL) 10 MG tablet Take 1 tablet by mouth 3 (three) times daily.   diphenhydrAMINE (BENADRYL) 25 MG tablet Take 25 mg by mouth every 6 (six) hours as needed.   meloxicam (MOBIC) 15 MG tablet TAKE 1 TABLET(15 MG) BY MOUTH DAILY   metoprolol succinate (TOPROL-XL) 25 MG 24 hr tablet  TAKE 1 TABLET(25 MG) BY MOUTH DAILY   MILK THISTLE PO Take by mouth.   Multiple Vitamin (MULITIVITAMIN WITH MINERALS) TABS Take 1 tablet by mouth daily.   Oxycodone HCl 10 MG TABS Take 10 mg by mouth 5 (five) times daily.   psyllium (REGULOID) 0.52 g capsule Take 0.52 g by mouth daily.   Testosterone Undecanoate (JATENZO) 237 MG CAPS Take 1 capsule (237 mg total) by mouth in the morning and at bedtime.   clotrimazole (MYCELEX) 10 MG troche Take 1 tablet (10 mg total) by mouth 5 (five) times daily. (Patient not taking: Reported on 09/21/2022)   doxycycline (VIBRA-TABS) 100 MG tablet Take 1 tablet (100 mg total) by mouth 2 (two) times daily. (Patient not taking: Reported on 09/21/2022)   magnesium oxide (MAG-OX) 400 MG tablet Take 400 mg by mouth 2 (two) times daily. (Patient not taking: Reported on 09/21/2022)   meclizine (ANTIVERT) 25 MG tablet Take 1 tablet (25 mg total) by mouth 3 (three) times daily as needed for dizziness. (Patient not taking: Reported on 09/21/2022)   No facility-administered encounter medications on file as of 09/21/2022.    Allergies (verified) Patient has no known allergies.   History: Past Medical History:  Diagnosis Date   Chronic pain in shoulder 2018   [facility]   H/O echocardiogram 10/08/2016  EF 65-70%, normal wall motion, systolic function vigorous   Hearing loss    pending hearing aids 06/2014   Hypertension    Lipoma    Varicose vein    Vestibular schwannoma (HCC) 04/2018   Past Surgical History:  Procedure Laterality Date   APPENDECTOMY     APPLICATION OF CRANIAL NAVIGATION Right 05/09/2018   Procedure: APPLICATION OF CRANIAL NAVIGATION;  Surgeon: Lisbeth Renshaw, MD;  Location: [facility] OR;  Service: Neurosurgery;  Laterality: Right;   BIOPSY  04/12/2018   Procedure: BIOPSY;  Surgeon: Charna Elizabeth, MD;  Location[facility] ENDOSCOPY;  Service: Endoscopy;;   COLONOSCOPY     age 34, he did not f/u with referral 2018   COLONOSCOPY WITH PROPOFOL N/A  04/12/2018   Procedure: COLONOSCOPY WITH PROPOFOL;  Surgeon: Charna Elizabeth, MD;  Location: [facility] ENDOSCOPY;  Service: Endoscopy;  Laterality: N/A;   CRANIOTOMY Right 05/09/2018   Procedure: Right retrosigmoid craniectomy, resection of acoustic neuroma with intraoperative facial monitoring/Brain Lab;  Surgeon: Lisbeth Renshaw, MD;  Location: [facility] OR;  Service: Neurosurgery;  Laterality: Right;   LUMBAR DISC SURGERY  2002   Dr. Jule Ser   WISDOM TOOTH EXTRACTION     Family History  Problem Relation Age of Onset   Hypertension Mother    Dementia Father    Parkinsonism Father    Diabetes Father        early stages   Cancer Maternal Grandmother        breast   Heart disease Neg Hx    Stroke Neg Hx    Social History   Socioeconomic History   Marital status: Divorced    Spouse name: Not on file   Number of children: Not on file   Years of education: Not on file   Highest education level: Not on file  Occupational History   Not on file  Tobacco Use   Smoking status: Never   Smokeless tobacco: Never  Vaping Use   Vaping Use: Never used  Substance and Sexual Activity   Alcohol use: Not Currently    Alcohol/week: 20.0 standard drinks of alcohol    Types: 20 Shots of liquor per week    Comment: stopped 3 weeks ago 04/12/2018   Drug use: No   Sexual activity: Not on file  Other Topics Concern   Not on file  Social History Narrative   Walking, single, 3 children, 3 girls. 07/2022   Social Determinants of Health   Financial Resource Strain: Low Risk  (09/21/2022)   Overall Financial Resource Strain (CARDIA)    Difficulty of Paying Living Expenses: Not hard at all  Food Insecurity: No Food Insecurity (09/21/2022)   Hunger Vital Sign    Worried About Running Out of Food in the Last Year: Never true    Ran Out of Food in the Last Year: Never true  Transportation Needs: No Transportation Needs (09/21/2022)   PRAPARE - Administrator, Civil Service (Medical): No    Lack of  Transportation (Non-Medical): No  Physical Activity: Sufficiently Active (09/21/2022)   Exercise Vital Sign    Days of Exercise per Week: 4 days    Minutes of Exercise per Session: 60 min  Stress: No Stress Concern Present (09/21/2022)   [employer] of Occupational Health - Occupational Stress Questionnaire    Feeling of Stress : Not at all  Social Connections: Socially Isolated (09/21/2022)   Social Connection and Isolation Panel [NHANES]    Frequency of Communication with Friends and Family: More  than three times a week    Frequency of Social Gatherings with Friends and Family: Three times a week    Attends Religious Services: Never    Active Member of Clubs or Organizations: No    Attends Engineer, structural: Never    Marital Status: Divorced    Tobacco Counseling Counseling given: Not Answered   Clinical Intake:  Pre-visit preparation completed: Yes  Pain : 0-10 Pain Score: 5  Pain Type: Chronic pain Pain Location: Back Pain Orientation: Lower Pain Descriptors / Indicators: Aching Pain Onset: More than a month ago Pain Frequency: Constant     Nutritional Status: BMI of 19-24  Normal Nutritional Risks: None Diabetes: No  How often do you need to have someone help you when you read instructions, pamphlets, or other written materials from your doctor or pharmacy?: 1 - Never  Interpreter Needed?: No  Information entered by :: NAllen LPN   Activities of Daily Living    09/21/2022    8:57 AM 12/08/2021   10:00 PM  In your present state of health, do you have any difficulty performing the following activities:  Hearing? 1 1  Comment deaf in one ear   Vision? 0 0  Difficulty concentrating or making decisions? 0 0  Walking or climbing stairs? 0 0  Dressing or bathing? 0 0  Doing errands, shopping? 0 1  Preparing Food and eating ? N   Using the Toilet? N   In the past six months, have you accidently leaked urine? N   Do you have problems with loss of  bowel control? N   Managing your Medications? N   Managing your Finances? N   Housekeeping or managing your Housekeeping? N     Patient Care Team: Tysinger, Kermit Balo, PA-C as PCP - General (Family Medicine)  Indicate any recent Medical Services you may have received from other than [facility] providers in the past year (date may be approximate).     Assessment:   This is a routine wellness examination for Yohei.  Hearing/Vision screen Hearing Screening - Comments:: Deaf in one ear Vision Screening - Comments:: No regular eye exams, [facility]  Dietary issues and exercise activities discussed:     Goals Addressed             This Visit's Progress    Patient Stated       09/21/2022, denies goal at this time       Depression Screen    09/21/2022    9:05 AM 08/04/2022    1:39 PM 10/05/2017    8:43 AM 10/04/2016    9:02 AM  PHQ 2/9 Scores  PHQ - 2 Score 0 0 0 0  PHQ- 9 Score 3       Fall Risk    09/21/2022    9:03 AM 08/04/2022    1:39 PM 10/05/2017    8:43 AM 10/04/2016    9:02 AM  Fall Risk   Falls in the past year? 1 0 No No  Comment passed out     Number falls in past yr: 0 0    Injury with Fall? 0 0    Risk for fall due to : Medication side effect No Fall Risks    Follow up Falls prevention discussed;Falls evaluation completed Falls evaluation completed      MEDICARE RISK AT HOME:  Medicare Risk at Home - 09/21/22 0904     Any stairs in or around the home? No  If so, are there any without handrails? No    Home free of loose throw rugs in walkways, pet beds, electrical cords, etc? Yes    Adequate lighting in your home to reduce risk of falls? Yes    Life alert? No    Use of a cane, walker or w/c? No    Grab bars in the bathroom? Yes    Shower chair or bench in shower? Yes    Elevated toilet seat or a handicapped toilet? Yes             TIMED UP AND GO:  Was the test performed?  No    Cognitive Function:        09/21/2022    9:06 AM  6CIT  Screen  What Year? 0 points  What month? 0 points  What time? 0 points  Count back from 20 0 points  Months in reverse 0 points  Repeat phrase 2 points  Total Score 2 points    Immunizations Immunization History  Administered Date(s) Administered   Pneumococcal Conjugate-13 10/04/2016   Pneumococcal Polysaccharide-23 10/05/2017   Tdap 07/08/2014   Zoster Recombinant(Shingrix) 03/21/2017, 06/14/2017    TDAP status: Up to date  Flu Vaccine status: Declined, Education has been provided regarding the importance of this vaccine but patient still declined. Advised may receive this vaccine at local pharmacy or Health Dept. Aware to provide a copy of the vaccination record if obtained from local pharmacy or Health Dept. Verbalized acceptance and understanding.  Pneumococcal vaccine status: Up to date  Covid-19 vaccine status: Declined, Education has been provided regarding the importance of this vaccine but patient still declined. Advised may receive this vaccine at local pharmacy or Health Dept.or vaccine clinic. Aware to provide a copy of the vaccination record if obtained from local pharmacy or Health Dept. Verbalized acceptance and understanding.  Qualifies for Shingles Vaccine? Yes   Zostavax completed Yes   Shingrix Completed?: Yes  Screening Tests Health Maintenance  Topic Date Due   COVID-19 Vaccine (1) Never done   INFLUENZA VACCINE  10/14/2022   Medicare Annual Wellness (AWV)  09/21/2023   DTaP/Tdap/Td (2 - Td or Tdap) 07/07/2024   Colonoscopy  04/12/2028   Pneumonia Vaccine 9+ Years old  Completed   Hepatitis C Screening  Completed   Zoster Vaccines- Shingrix  Completed   HPV VACCINES  Aged Out    Health Maintenance  Health Maintenance Due  Topic Date Due   COVID-19 Vaccine (1) Never done    Colorectal cancer screening: Type of screening: Colonoscopy. Completed 04/12/2018. Repeat every 10 years  Lung Cancer Screening: (Low Dose CT Chest recommended if Age  35-80 years, 20 pack-year currently smoking OR have quit w/in 15years.) does not qualify.   Lung Cancer Screening Referral: no  Additional Screening:  Hepatitis C Screening: does qualify; Completed 10/05/2017  Vision Screening: Recommended annual ophthalmology exams for early detection of glaucoma and other disorders of the eye. Is the patient up to date with their annual eye exam?  No  Who is the provider or what is the name of the office in which the patient attends annual eye exams? [facility] If pt is not established with a provider, would they like to be referred to a provider to establish care? No .   Dental Screening: Recommended annual dental exams for proper oral hygiene  Diabetic Foot Exam: n/a  Community Resource Referral / Chronic Care Management: CRR required this visit?  No   CCM required this  visit?  No     Plan:     I have personally reviewed and noted the following in the patient's chart:   Medical and social history Use of alcohol, tobacco or illicit drugs  Current medications and supplements including opioid prescriptions. Patient is currently taking opioid prescriptions. Information provided to patient regarding non-opioid alternatives. Patient advised to discuss non-opioid treatment plan with their provider. Functional ability and status Nutritional status Physical activity Advanced directives List of other physicians Hospitalizations, surgeries, and ER visits in previous 12 months Vitals Screenings to include cognitive, depression, and falls Referrals and appointments  In addition, I have reviewed and discussed with patient certain preventive protocols, quality metrics, and best practice recommendations. A written personalized care plan for preventive services as well as general preventive health recommendations were provided to patient.     Barb Merino, LPN   03/20/1094   After Visit Summary: (MyChart) Due to this being a telephonic visit,  the after visit summary with patients personalized plan was offered to patient via MyChart   Nurse Notes: none

## 2022-09-30 ENCOUNTER — Other Ambulatory Visit: Payer: Medicare Other

## 2022-09-30 DIAGNOSIS — D333 Benign neoplasm of cranial nerves: Secondary | ICD-10-CM

## 2022-09-30 MED ORDER — GADOPICLENOL 0.5 MMOL/ML IV SOLN
6.0000 mL | Freq: Once | INTRAVENOUS | Status: AC | PRN
  Administered 2022-09-30: 6 mL via INTRAVENOUS

## 2022-10-01 ENCOUNTER — Ambulatory Visit (INDEPENDENT_AMBULATORY_CARE_PROVIDER_SITE_OTHER): Payer: 59

## 2022-10-01 ENCOUNTER — Other Ambulatory Visit: Payer: Self-pay | Admitting: Medical

## 2022-10-01 DIAGNOSIS — I2510 Atherosclerotic heart disease of native coronary artery without angina pectoris: Secondary | ICD-10-CM

## 2022-10-01 DIAGNOSIS — I10 Essential (primary) hypertension: Secondary | ICD-10-CM

## 2022-10-01 DIAGNOSIS — R931 Abnormal findings on diagnostic imaging of heart and coronary circulation: Secondary | ICD-10-CM

## 2022-10-01 LAB — ECHOCARDIOGRAM COMPLETE
Area-P 1/2: 2.45 cm2
S' Lateral: 2.93 cm

## 2022-10-01 NOTE — Progress Notes (Signed)
Results sent through MyChart

## 2022-10-04 ENCOUNTER — Other Ambulatory Visit: Payer: Self-pay | Admitting: Medical

## 2022-10-11 NOTE — Progress Notes (Unsigned)
Cardiology Office Note:    Date:  10/11/2022   ID:  Logan Phillips, DOB [birth year], MRN [id number]  PCP:  Genia Del   [facility] Providers Cardiologist:  None { Click to update primary MD,subspecialty MD or APP then REFRESH:1}    Referring MD: Jac Canavan, PA-C   No chief complaint on file. ***  History of Present Illness:    Logan Phillips is a 72 y.o. male with no cardiac dx hx, referral for an abnormal echo.   He denies angina, dyspnea on exertion, lower extremity edema, PND or orthopnea.    Past Medical History:  Diagnosis Date   Chronic pain in shoulder 2018   [facility]   H/O echocardiogram 10/08/2016   EF 65-70%, normal wall motion, systolic function vigorous   Hearing loss    pending hearing aids 06/2014   Hypertension    Lipoma    Varicose vein    Vestibular schwannoma (HCC) 04/2018    Past Surgical History:  Procedure Laterality Date   APPENDECTOMY     APPLICATION OF CRANIAL NAVIGATION Right 05/09/2018   Procedure: APPLICATION OF CRANIAL NAVIGATION;  Surgeon: Lisbeth Renshaw, MD;  Location: [facility] OR;  Service: Neurosurgery;  Laterality: Right;   BIOPSY  04/12/2018   Procedure: BIOPSY;  Surgeon: Charna Elizabeth, MD;  Location: [facility] ENDOSCOPY;  Service: Endoscopy;;   COLONOSCOPY     age 57, he did not f/u with referral 2018   COLONOSCOPY WITH PROPOFOL N/A 04/12/2018   Procedure: COLONOSCOPY WITH PROPOFOL;  Surgeon: Charna Elizabeth, MD;  Location: [facility] ENDOSCOPY;  Service: Endoscopy;  Laterality: N/A;   CRANIOTOMY Right 05/09/2018   Procedure: Right retrosigmoid craniectomy, resection of acoustic neuroma with intraoperative facial monitoring/Brain Lab;  Surgeon: Lisbeth Renshaw, MD;  Location: [facility] OR;  Service: Neurosurgery;  Laterality: Right;   LUMBAR DISC SURGERY  2002   Dr. Jule Ser   WISDOM TOOTH EXTRACTION      Current Medications: No outpatient medications have been marked as taking for the 10/12/22 encounter  (Appointment) with Maisie Fus, MD.     Allergies:   Patient has no known allergies.   Social History   Socioeconomic History   Marital status: Divorced    Spouse name: Not on file   Number of children: Not on file   Years of education: Not on file   Highest education level: Not on file  Occupational History   Not on file  Tobacco Use   Smoking status: Never   Smokeless tobacco: Never  Vaping Use   Vaping status: Never Used  Substance and Sexual Activity   Alcohol use: Not Currently    Alcohol/week: 20.0 standard drinks of alcohol    Types: 20 Shots of liquor per week    Comment: stopped 3 weeks ago 04/12/2018   Drug use: No   Sexual activity: Not on file  Other Topics Concern   Not on file  Social History Narrative   Walking, single, 3 children, 3 girls. 07/2022   Social Determinants of Health   Financial Resource Strain: Low Risk  (09/21/2022)   Overall Financial Resource Strain (CARDIA)    Difficulty of Paying Living Expenses: Not hard at all  Food Insecurity: No Food Insecurity (09/21/2022)   Hunger Vital Sign    Worried About Running Out of Food in the Last Year: Never true    Ran Out of Food in the Last Year: Never true  Transportation Needs: No Transportation Needs (09/21/2022)  PRAPARE - Administrator, Civil Service (Medical): No    Lack of Transportation (Non-Medical): No  Physical Activity: Sufficiently Active (09/21/2022)   Exercise Vital Sign    Days of Exercise per Week: 4 days    Minutes of Exercise per Session: 60 min  Stress: No Stress Concern Present (09/21/2022)   [employer] of Occupational Health - Occupational Stress Questionnaire    Feeling of Stress : Not at all  Social Connections: Socially Isolated (09/21/2022)   Social Connection and Isolation Panel [NHANES]    Frequency of Communication with Friends and Family: More than three times a week    Frequency of Social Gatherings with Friends and Family: Three times a week    Attends  Religious Services: Never    Active Member of Clubs or Organizations: No    Attends Engineer, structural: Never    Marital Status: Divorced     Family History: The patient's ***family history includes Cancer in his maternal grandmother; Dementia in his father; Diabetes in his father; Hypertension in his mother; Parkinsonism in his father. There is no history of Heart disease or Stroke.  ROS:   Please see the history of present illness.    *** All other systems reviewed and are negative.  EKGs/Labs/Other Studies Reviewed:    The following studies were reviewed today: ***      Recent Labs: 12/07/2021: B Natriuretic Peptide 35.1 12/10/2021: Magnesium 2.1 08/13/2022: ALT 17; BUN 28; Creatinine, Ser 1.28; Potassium 4.6; Sodium 136; TSH 1.730 08/23/2022: Hemoglobin 13.6; Platelets 209  Recent Lipid Panel    Component Value Date/Time   CHOL 160 08/04/2022 1418   TRIG 125 08/04/2022 1418   HDL 55 08/04/2022 1418   CHOLHDL 2.9 08/04/2022 1418   CHOLHDL 2.8 10/04/2016 0826   VLDL 25 10/04/2016 0826   LDLCALC 83 08/04/2022 1418     Risk Assessment/Calculations:   {Does this patient have ATRIAL FIBRILLATION?:[id number]}  No BP recorded.  {Refresh Note OR Click here to enter BP  :1}***         Physical Exam:    VS:  There were no vitals taken for this visit.    Wt Readings from Last 3 Encounters:  09/21/22 140 lb (63.5 kg)  08/27/22 136 lb (61.7 kg)  08/23/22 137 lb 9.6 oz (62.4 kg)     GEN: *** Well nourished, well developed in no acute distress HEENT: Normal NECK: No JVD; No carotid bruits LYMPHATICS: No lymphadenopathy CARDIAC: ***RRR, no murmurs, rubs, gallops RESPIRATORY:  Clear to auscultation without rales, wheezing or rhonchi  ABDOMEN: Soft, non-tender, non-distended MUSCULOSKELETAL:  No edema; No deformity  SKIN: Warm and dry NEUROLOGIC:  Alert and oriented x 3 PSYCHIATRIC:  Normal affect   ASSESSMENT:    Posterior hypokinesis towards the apex -  will get a SPECT to assess for inducible ischemia vs. scar PLAN:    In order of problems listed above:  Lexiscan SPECT      {Are you ordering a CV Procedure (e.g. stress test, cath, DCCV, TEE, etc)?   Press F2        :[id number]}    Medication Adjustments/Labs and Tests Ordered: Current medicines are reviewed at length with the patient today.  Concerns regarding medicines are outlined above.  No orders of the defined types were placed in this encounter.  No orders of the defined types were placed in this encounter.   There are no Patient Instructions on file for this visit.   Signed, Mabrey Howland,  Alben Spittle, MD  10/11/2022 10:35 AM    [facility]

## 2022-10-12 ENCOUNTER — Ambulatory Visit: Payer: 59 | Attending: Internal Medicine | Admitting: Internal Medicine

## 2022-10-12 ENCOUNTER — Encounter (HOSPITAL_COMMUNITY): Payer: Self-pay | Admitting: *Deleted

## 2022-10-12 ENCOUNTER — Telehealth (HOSPITAL_COMMUNITY): Payer: Self-pay | Admitting: *Deleted

## 2022-10-12 ENCOUNTER — Encounter: Payer: Self-pay | Admitting: Internal Medicine

## 2022-10-12 VITALS — BP 110/78 | HR 57 | Ht 67.0 in | Wt 140.8 lb

## 2022-10-12 DIAGNOSIS — R931 Abnormal findings on diagnostic imaging of heart and coronary circulation: Secondary | ICD-10-CM | POA: Diagnosis not present

## 2022-10-12 MED ORDER — ROSUVASTATIN CALCIUM 10 MG PO TABS: 10.0000 mg | ORAL_TABLET | Freq: Every day | ORAL | 3 refills | Status: DC

## 2022-10-12 MED ORDER — ASPIRIN 81 MG PO TBEC: 81.0000 mg | DELAYED_RELEASE_TABLET | Freq: Every day | ORAL | Status: DC

## 2022-10-12 NOTE — Telephone Encounter (Signed)
Letter sent to my chart with instructions for upcoming stress test.  Phillips, Logan Jacqueline  

## 2022-10-12 NOTE — Patient Instructions (Addendum)
Medication Instructions:  Your physician has recommended you make the following change in your medication:  START CRESTOR 10 MG DAILY. START ASPIRIN 81 MG DAILY.   *If you need a refill on your cardiac medications before your next appointment, please call your pharmacy*   Lab Work: NONE If you have labs (blood work) drawn today and your tests are completely normal, you will receive your results only by: MyChart Message (if you have MyChart) OR A paper copy in the mail If you have any lab test that is abnormal or we need to change your treatment, we will call you to review the results.   Testing/Procedures: Your physician has requested that you have en exercise stress myoview. For further information please visit [email or web address] Please follow instruction sheet, as given.   Follow-Up: At [facility], you and your health needs are our priority.  As part of our continuing mission to provide you with exceptional heart care, we have created designated Provider Care Teams.  These Care Teams include your primary Cardiologist (physician) and Advanced Practice Providers (APPs -  Physician Assistants and Nurse Practitioners) who all work together to provide you with the care you need, when you need it.  We recommend signing up for the patient portal called "MyChart".  Sign up information is provided on this After Visit Summary.  MyChart is used to connect with patients for Virtual Visits (Telemedicine).  Patients are able to view lab/test results, encounter notes, upcoming appointments, etc.  Non-urgent messages can be sent to your provider as well.   To learn more about what you can do with MyChart, go to [email or web address].    Your next appointment:   6 month(s)  Provider:   DR. Wyline Mood

## 2022-10-13 ENCOUNTER — Ambulatory Visit (HOSPITAL_COMMUNITY): Payer: 59

## 2022-10-14 ENCOUNTER — Ambulatory Visit (HOSPITAL_COMMUNITY): Payer: 59

## 2022-10-14 ENCOUNTER — Telehealth (HOSPITAL_COMMUNITY): Payer: Self-pay | Admitting: *Deleted

## 2022-10-14 NOTE — Telephone Encounter (Signed)
Patient given detailed instructions per Myocardial Perfusion Study Information Sheet for the test on 10/18/2022 at 10:00. Patient notified to arrive 15 minutes early and that it is imperative to arrive on time for appointment to keep from having the test rescheduled.  If you need to cancel or reschedule your appointment, please call the office within 24 hours of your appointment. . Patient verbalized understanding.Logan Phillips

## 2022-10-18 ENCOUNTER — Ambulatory Visit (HOSPITAL_COMMUNITY): Payer: 59 | Attending: Internal Medicine

## 2022-10-18 ENCOUNTER — Other Ambulatory Visit (HOSPITAL_COMMUNITY): Payer: Self-pay | Admitting: Internal Medicine

## 2022-10-18 ENCOUNTER — Telehealth: Payer: Self-pay | Admitting: Medical

## 2022-10-18 DIAGNOSIS — R931 Abnormal findings on diagnostic imaging of heart and coronary circulation: Secondary | ICD-10-CM | POA: Diagnosis not present

## 2022-10-18 DIAGNOSIS — I10 Essential (primary) hypertension: Secondary | ICD-10-CM | POA: Insufficient documentation

## 2022-10-18 DIAGNOSIS — R42 Dizziness and giddiness: Secondary | ICD-10-CM | POA: Insufficient documentation

## 2022-10-18 LAB — MYOCARDIAL PERFUSION IMAGING
Rest Nuclear Isotope Dose: 10.1 mCi
Stress Nuclear Isotope Dose: 30.7 mCi

## 2022-10-18 MED ORDER — TECHNETIUM TC 99M TETROFOSMIN IV KIT
10.1000 | PACK | Freq: Once | INTRAVENOUS | Status: AC | PRN
  Administered 2022-10-18: 10.1 via INTRAVENOUS

## 2022-10-18 MED ORDER — TECHNETIUM TC 99M TETROFOSMIN IV KIT
30.7000 | PACK | Freq: Once | INTRAVENOUS | Status: AC | PRN
  Administered 2022-10-18: 30.7 via INTRAVENOUS

## 2022-10-18 NOTE — Telephone Encounter (Signed)
Pt states Testosterone undecanoate rx is $600 states we were able to get is paid for by insurance previously

## 2022-10-22 NOTE — Telephone Encounter (Signed)
Called pharmacy said they used discount card without insurance last time went thru for $10 & this time is $600, doesn't know if was just trial or what.  Had them run with ins & was rejected needs P.A.  I located [employer] card & if insurance denies P.A. he can get it for $150 a month with discount card.  Called pt left message need to know if he has ever tried any other testosterone therapies or why he needs oral ?  Preferred are Androderm or Testosterone gel

## 2022-10-22 NOTE — Telephone Encounter (Signed)
Pt called back & states he has not tried any testosterone meds.  Went ahead & sent in P.A. but more than likely will be denied with no trial of preferred.  I asked if he wanted to use discount card & pay $150 for Jatenzo & he'd rather change to preferred Androderm or Testosterone gel, whichever you'd prefer.  Please send into [facility]

## 2022-10-24 ENCOUNTER — Other Ambulatory Visit: Payer: Self-pay | Admitting: Medical

## 2022-10-24 MED ORDER — TESTOSTERONE 25 MG/2.5GM (1%) TD GEL: 2.0000 | Freq: Every day | TRANSDERMAL | 3 refills | Status: DC

## 2022-10-26 NOTE — Telephone Encounter (Signed)
Called pharmacy & Rx went thru for $0,  called pt & left message for pt with instructions

## 2022-10-26 NOTE — Progress Notes (Signed)
Echo showing wall motion abnormalities.

## 2022-11-01 ENCOUNTER — Encounter: Payer: Self-pay | Admitting: Medical

## 2022-11-01 ENCOUNTER — Ambulatory Visit (INDEPENDENT_AMBULATORY_CARE_PROVIDER_SITE_OTHER): Payer: 59 | Admitting: Medical

## 2022-11-01 VITALS — BP 108/68 | HR 58 | Ht 67.0 in | Wt 139.4 lb

## 2022-11-01 DIAGNOSIS — R5383 Other fatigue: Secondary | ICD-10-CM

## 2022-11-01 DIAGNOSIS — I10 Essential (primary) hypertension: Secondary | ICD-10-CM

## 2022-11-01 DIAGNOSIS — R7989 Other specified abnormal findings of blood chemistry: Secondary | ICD-10-CM | POA: Diagnosis not present

## 2022-11-01 DIAGNOSIS — R0989 Other specified symptoms and signs involving the circulatory and respiratory systems: Secondary | ICD-10-CM

## 2022-11-01 NOTE — Progress Notes (Signed)
Subjective:  Logan Phillips is a 72 y.o. male who presents for Chief Complaint  Patient presents with   Follow-up    Follow up on cardiac testing.      Here for concerns about energy.  He feels like he is having the boost up his energy.  He was on testosterone Jatenzo all and did well on this but recently insurance would not pay for this.  He has been on 2 days of AndroGel so wonders if he is just has to give this more time.  He recently had cardiac testing and saw cardiology.  They felt that the stress test looked fine although the original echocardiogram was abnormal  He notes home blood pressures running around 110/80 range.  He takes Toprol-XL 25 mg daily but also has leftover lisinopril and 100 mg metoprolol he uses periodically with blood pressure is too high.  He wonders about some type of medicine or stimulant to boost his energy or metabolism.  No other aggravating or relieving factors.    No other c/o.  Past Medical History:  Diagnosis Date   Chronic pain in shoulder 2018   [facility]   H/O echocardiogram 10/08/2016   EF 65-70%, normal wall motion, systolic function vigorous   Hearing loss    pending hearing aids 06/2014   Hypertension    Lipoma    Varicose vein    Vestibular schwannoma (HCC) 04/2018   Current Outpatient Medications on File Prior to Visit  Medication Sig Dispense Refill   acetaminophen (TYLENOL) 500 MG tablet Take 1,000 mg by mouth every 6 (six) hours as needed.     allopurinol (ZYLOPRIM) 100 MG tablet Take 1 tablet by mouth daily. Taking 1 in morning, 1/2 in afteroon     aspirin EC 81 MG tablet Take 1 tablet (81 mg total) by mouth daily. Swallow whole.     cyclobenzaprine (FLEXERIL) 10 MG tablet Take 1 tablet by mouth 3 (three) times daily.     diphenhydrAMINE (BENADRYL) 25 MG tablet Take 25 mg by mouth every 6 (six) hours as needed.     meloxicam (MOBIC) 15 MG tablet TAKE 1 TABLET(15 MG) BY MOUTH DAILY 30 tablet 0   metoprolol  succinate (TOPROL-XL) 25 MG 24 hr tablet TAKE 1 TABLET(25 MG) BY MOUTH DAILY 90 tablet 0   MILK THISTLE PO Take by mouth.     Multiple Vitamin (MULITIVITAMIN WITH MINERALS) TABS Take 1 tablet by mouth daily.     Oxycodone HCl 10 MG TABS Take 10 mg by mouth 5 (five) times daily.     rosuvastatin (CRESTOR) 10 MG tablet Take 1 tablet (10 mg total) by mouth daily. 90 tablet 3   Testosterone 25 MG/2.5GM (1%) GEL Place 2 Squirts onto the skin daily. 2.5 g 3   colchicine 0.6 MG tablet Take 1 tablet (0.6 mg total) by mouth 2 (two) times daily as needed. (Patient not taking: Reported on 10/12/2022) 60 tablet 0   CRANBERRY PO Take by mouth. (Patient not taking: Reported on 11/01/2022)     magnesium oxide (MAG-OX) 400 MG tablet Take 400 mg by mouth 2 (two) times daily. (Patient not taking: Reported on 11/01/2022)     Testosterone Undecanoate (JATENZO) 237 MG CAPS Take 1 capsule (237 mg total) by mouth in the morning and at bedtime. (Patient not taking: Reported on 11/01/2022) 60 capsule 1   No current facility-administered medications on file prior to visit.      The following portions of the patient's  history were reviewed and updated as appropriate: allergies, current medications, past family history, past medical history, past social history, past surgical history and problem list.  ROS Otherwise as in subjective above    Objective: BP 108/68   Pulse (!) 58   Ht 5\' 7"  (1.702 m)   Wt 139 lb 6.4 oz (63.2 kg)   SpO2 96%   BMI 21.83 kg/m   Wt Readings from Last 3 Encounters:  11/01/22 139 lb 6.4 oz (63.2 kg)  10/18/22 140 lb (63.5 kg)  10/14/22 140 lb (63.5 kg)   BP Readings from Last 3 Encounters:  11/01/22 108/68  10/12/22 110/78  08/27/22 104/66    General appearance: alert, no distress, well developed, well nourished Neck: supple, no lymphadenopathy, no thyromegaly, no masses Heart: RRR, normal S1, S2, no murmurs Lungs: CTA bilaterally, no wheezes, rhonchi, or rales Pulses: 2+  radial pulses, 2+ pedal pulses, normal cap refill Ext: no edema   Assessment: Encounter Diagnoses  Name Primary?   Labile blood pressure Yes   Other fatigue    Low testosterone    Essential hypertension      Plan: Discussed concerns, possible causes of fatigue.  Discussed recommendations below.   Patient Instructions  For the time being, change to Lisinopril 10mg , 2 tablets daily instead of just one.  Don't take any more metoprolol 100mg  for now  Monitor blood pressures.  Goal is around 120/70.   Under 105/65 is too low  For this week, stop Toprol Xl 25mg  daily, and just do the Lisinopril for this week  Call or my chart message me readings later this week    Continue for now with the testosterone topical gel.   Over the next 2 weeks if not seeing improvement, we can adjust does   Your recent labs does NOT show anemia, abnormal electrolytes, no abnormal thyroid.  So fatigue or decreased energy could be from low pressure, low testosterone, and other medications side effects.     Michelle was seen today for follow-up.  Diagnoses and all orders for this visit:  Labile blood pressure  Other fatigue  Low testosterone  Essential hypertension    Follow up: call report 2 week

## 2022-11-01 NOTE — Patient Instructions (Signed)
For the time being, change to Lisinopril 10mg , 2 tablets daily instead of just one.  Don't take any more metoprolol 100mg  for now  Monitor blood pressures.  Goal is around 120/70.   Under 105/65 is too low  For this week, stop Toprol Xl 25mg  daily, and just do the Lisinopril for this week  Call or my chart message me readings later this week    Continue for now with the testosterone topical gel.   Over the next 2 weeks if not seeing improvement, we can adjust does   Your recent labs does NOT show anemia, abnormal electrolytes, no abnormal thyroid.  So fatigue or decreased energy could be from low pressure, low testosterone, and other medications side effects.

## 2022-11-11 ENCOUNTER — Other Ambulatory Visit: Payer: Self-pay | Admitting: Medical

## 2022-11-12 NOTE — Telephone Encounter (Signed)
LAST APT 11/01/22

## 2022-11-30 ENCOUNTER — Institutional Professional Consult (permissible substitution): Payer: 59 | Admitting: Medical

## 2022-12-01 ENCOUNTER — Ambulatory Visit (INDEPENDENT_AMBULATORY_CARE_PROVIDER_SITE_OTHER): Payer: 59 | Admitting: Medical

## 2022-12-01 VITALS — BP 110/68 | HR 73 | Wt 145.0 lb

## 2022-12-01 DIAGNOSIS — R7989 Other specified abnormal findings of blood chemistry: Secondary | ICD-10-CM

## 2022-12-01 DIAGNOSIS — I10 Essential (primary) hypertension: Secondary | ICD-10-CM | POA: Diagnosis not present

## 2022-12-01 MED ORDER — LISINOPRIL 10 MG PO TABS: ORAL_TABLET | ORAL | 1 refills | Status: DC

## 2022-12-01 MED ORDER — JATENZO 237 MG PO CAPS: 1.0000 | ORAL_CAPSULE | Freq: Two times a day (BID) | ORAL | 2 refills | Status: DC

## 2022-12-01 NOTE — Progress Notes (Signed)
Subjective:  Logan Phillips is a 72 y.o. male who presents for Chief Complaint  Patient presents with   Follow-up    Follow-up on HTN and testosterone     Here for recheck on blood pressure.  Last visit he was getting low readings so we discontinue Toprol and modified his dose of lisinopril.  He has his blood pressure cuff with him today.  He has a few low readings but mostly normal readings are little bit elevated readings.  He has some readings around 140/90, several readings normal, few readings 130/80-ish.  No chest pain, no palpitations.  Here for recheck on low testosterone, using topical testosterone 1 Daily and does not see any improvement.  He did well on Jatenzo oral medication prior  No other aggravating or relieving factors.    No other c/o.  Past Medical History:  Diagnosis Date   Chronic pain in shoulder 2018   [facility]   H/O echocardiogram 10/08/2016   EF 65-70%, normal wall motion, systolic function vigorous   Hearing loss    pending hearing aids 06/2014   Hypertension    Lipoma    Varicose vein    Vestibular schwannoma (HCC) 04/2018   Current Outpatient Medications on File Prior to Visit  Medication Sig Dispense Refill   acetaminophen (TYLENOL) 500 MG tablet Take 1,000 mg by mouth every 6 (six) hours as needed.     allopurinol (ZYLOPRIM) 100 MG tablet Take 1 tablet by mouth daily. Taking 1 in morning, 1/2 in afteroon     aspirin EC 81 MG tablet Take 1 tablet (81 mg total) by mouth daily. Swallow whole.     colchicine 0.6 MG tablet Take 1 tablet (0.6 mg total) by mouth 2 (two) times daily as needed. 60 tablet 0   CRANBERRY PO Take by mouth.     cyclobenzaprine (FLEXERIL) 10 MG tablet Take 1 tablet by mouth 3 (three) times daily.     diphenhydrAMINE (BENADRYL) 25 MG tablet Take 25 mg by mouth every 6 (six) hours as needed.     magnesium oxide (MAG-OX) 400 MG tablet Take 400 mg by mouth 2 (two) times daily.     meloxicam (MOBIC) 15 MG tablet TAKE 1  TABLET(15 MG) BY MOUTH DAILY 30 tablet 0   MILK THISTLE PO Take by mouth.     Multiple Vitamin (MULITIVITAMIN WITH MINERALS) TABS Take 1 tablet by mouth daily.     Oxycodone HCl 10 MG TABS Take 10 mg by mouth 5 (five) times daily.     rosuvastatin (CRESTOR) 10 MG tablet Take 1 tablet (10 mg total) by mouth daily. 90 tablet 3   Testosterone 25 MG/2.5GM (1%) GEL Place 2 Squirts onto the skin daily. 2.5 g 3   No current facility-administered medications on file prior to visit.     The following portions of the patient's history were reviewed and updated as appropriate: allergies, current medications, past family history, past medical history, past social history, past surgical history and problem list.  ROS Otherwise as in subjective above    Objective: BP 110/68   Pulse 73   Wt 145 lb (65.8 kg)   BMI 22.71 kg/m   Wt Readings from Last 3 Encounters:  12/01/22 145 lb (65.8 kg)  11/01/22 139 lb 6.4 oz (63.2 kg)  10/18/22 140 lb (63.5 kg)   General appearance: alert, no distress, well developed, well nourished    Assessment: Encounter Diagnoses  Name Primary?   Essential hypertension Yes  Low testosterone      Plan: We discussed his blood pressure readings and his concerns and symptoms.  Discussed recommendations as below.  Patient Instructions  Recommendations:  Hypertension Change Lisinopril to 1 tablet (10mg ) daily in the morning, 1/2 tablet in the afternoon Monitor blood pressures as you are doing Goal is 120/70.  We want to avoid numbers < 100/60  Low testosterone We will try again for the Jatenzo oral medication If insurance still gives [place] a problems  we may have you use your topical testosterone , 2 packets daily instead of 1 daily    Logan Phillips was seen today for follow-up.  Diagnoses and all orders for this visit:  Essential hypertension  Low testosterone  Other orders -     lisinopril (ZESTRIL) 10 MG tablet; 1 tablet morning, 1/2 tablet afternoon -      Testosterone Undecanoate (JATENZO) 237 MG CAPS; Take 1 capsule (237 mg total) by mouth in the morning and at bedtime.    Follow up: 29mo

## 2022-12-01 NOTE — Patient Instructions (Signed)
Recommendations:  Hypertension Change Lisinopril to 1 tablet (10mg ) daily in the morning, 1/2 tablet in the afternoon Monitor blood pressures as you are doing Goal is 120/70.  We want to avoid numbers < 100/60  Low testosterone We will try again for the Jatenzo oral medication If insurance still gives [place] a problems  we may have you use your topical testosterone , 2 packets daily instead of 1 daily

## 2022-12-05 ENCOUNTER — Telehealth: Payer: Self-pay | Admitting: Medical

## 2022-12-05 NOTE — Progress Notes (Signed)
SEE TELEPHONE CALL 12/05/22

## 2022-12-05 NOTE — Telephone Encounter (Signed)
P.ABrooks Sailors  unable to process your request for prior authorization for JATENZO CAP 237MG  for the above member due to [employer] has a denied request on file for JATENZO CAP 237MG  for this member. Please refer to the appeals process outlined in the original denial or contact Prior Authorization Department at [phone]

## 2022-12-09 ENCOUNTER — Telehealth: Payer: Self-pay | Admitting: Medical

## 2022-12-09 NOTE — Telephone Encounter (Signed)
I believe this might be a PA as new testosterone med was sent in 9/18. Pt is waiting to hear back

## 2022-12-09 NOTE — Telephone Encounter (Signed)
Please call, patient states you were changing his testosterone meds and he is waiting to hear bcak

## 2023-01-14 ENCOUNTER — Other Ambulatory Visit: Payer: Self-pay | Admitting: Medical

## 2023-02-08 ENCOUNTER — Ambulatory Visit (INDEPENDENT_AMBULATORY_CARE_PROVIDER_SITE_OTHER): Payer: 59 | Admitting: Medical

## 2023-02-08 VITALS — BP 104/68 | HR 61 | Wt 141.4 lb

## 2023-02-08 DIAGNOSIS — R7989 Other specified abnormal findings of blood chemistry: Secondary | ICD-10-CM | POA: Diagnosis not present

## 2023-02-08 DIAGNOSIS — R031 Nonspecific low blood-pressure reading: Secondary | ICD-10-CM

## 2023-02-08 DIAGNOSIS — R42 Dizziness and giddiness: Secondary | ICD-10-CM

## 2023-02-08 DIAGNOSIS — R5383 Other fatigue: Secondary | ICD-10-CM

## 2023-02-08 DIAGNOSIS — I10 Essential (primary) hypertension: Secondary | ICD-10-CM | POA: Diagnosis not present

## 2023-02-08 NOTE — Progress Notes (Signed)
Subjective:  Logan Phillips is a 72 y.o. male who presents for Chief Complaint  Patient presents with   Follow-up    Follow-up on Blood pressure and wants blood work. Pt bp has been high and then been low and he says its all over the place     Here for med check.  History of HTN - in recent months we have backed off medications he was on previously due to low pressure, dizziness, lightheaded when he stands up.   Lately getting BP numbers ranging from too low to too high.   Currently on Lisinopril 10mg , sometimes once daily, sometimes 2 tablets daily if BP high.   Sometimes gets up in the morning and BP is quite high so he will take 2 tablets.  Other times 1 tablet.   But if in morning BP is low, doesn't take BP medicaiton at all.  Still can feel lightheaded if standing up.  No chest pain, on edema, no palpations.     Nonsmoker.  Has hx/o alcohol abuse, but notes current alcohol, 1 drink every 3-4 days  Works in Aeronautical engineer about 4 hours daily.  Low testosterone - uses 1 packet of testosterone gel daily, spread on both legs.   Was on jatenzo briefly but it was too expensive.  No breathing concerns, no SOB, no DOE.   No other aggravating or relieving factors.    No other c/o.  Past Medical History:  Diagnosis Date   Chronic pain in shoulder 2018   [facility]   H/O echocardiogram 10/08/2016   EF 65-70%, normal wall motion, systolic function vigorous   Hearing loss    pending hearing aids 06/2014   Hypertension    Lipoma    Varicose vein    Vestibular schwannoma (HCC) 04/2018   Current Outpatient Medications on File Prior to Visit  Medication Sig Dispense Refill   allopurinol (ZYLOPRIM) 100 MG tablet Take 1 tablet by mouth daily. Taking 1 in morning, 1/2 in afteroon     aspirin EC 81 MG tablet Take 1 tablet (81 mg total) by mouth daily. Swallow whole.     colchicine 0.6 MG tablet Take 1 tablet (0.6 mg total) by mouth 2 (two) times daily as needed. 60 tablet 0    CRANBERRY PO Take by mouth.     cyclobenzaprine (FLEXERIL) 10 MG tablet Take 1 tablet by mouth 3 (three) times daily.     diphenhydrAMINE (BENADRYL) 25 MG tablet Take 25 mg by mouth every 6 (six) hours as needed.     lisinopril (ZESTRIL) 10 MG tablet 1 tablet morning, 1/2 tablet afternoon 180 tablet 1   magnesium oxide (MAG-OX) 400 MG tablet Take 400 mg by mouth 2 (two) times daily.     meloxicam (MOBIC) 15 MG tablet TAKE 1 TABLET(15 MG) BY MOUTH DAILY 90 tablet 0   MILK THISTLE PO Take by mouth.     Multiple Vitamin (MULITIVITAMIN WITH MINERALS) TABS Take 1 tablet by mouth daily.     Oxycodone HCl 10 MG TABS Take 10 mg by mouth 5 (five) times daily.     Testosterone 25 MG/2.5GM (1%) GEL Place 2 Squirts onto the skin daily. 2.5 g 3   acetaminophen (TYLENOL) 500 MG tablet Take 1,000 mg by mouth every 6 (six) hours as needed.     rosuvastatin (CRESTOR) 10 MG tablet Take 1 tablet (10 mg total) by mouth daily. 90 tablet 3   No current facility-administered medications on file prior to visit.  The following portions of the patient's history were reviewed and updated as appropriate: allergies, current medications, past family history, past medical history, past social history, past surgical history and problem list.  ROS Otherwise as in subjective above    Objective: BP 104/68   Pulse 61   Wt 141 lb 6.4 oz (64.1 kg)   BMI 22.15 kg/m   Wt Readings from Last 3 Encounters:  02/08/23 141 lb 6.4 oz (64.1 kg)  12/01/22 145 lb (65.8 kg)  11/01/22 139 lb 6.4 oz (63.2 kg)   BP Readings from Last 3 Encounters:  02/08/23 104/68  12/01/22 110/68  11/01/22 108/68    General appearence: alert, no distress, WD/WN,  Neck: supple, no lymphadenopathy, no thyromegaly, no masses Heart: RRR, normal S1, S2, no murmurs Lungs: CTA bilaterally, no wheezes, rhonchi, or rales Ext: no edema Pulses: 2+ symmetric, upper and lower extremities, normal cap refill      Assessment: Encounter Diagnoses   Name Primary?   Essential hypertension Yes   Low testosterone    Fatigue, unspecified type    Dizziness    Low blood pressure reading       Plan: Hypertension-given some recent low readings and given his fluctuations, we are going to temporarily stop all blood pressure medication.  So at this point discontinue lisinopril 10 mg daily.  He was on metoprolol prior and had already stopped that.  I asked him to bring his blood pressure cuff so we can compare our cuff to his cuff.  We want to avoid symptoms related to hypotension.  Continue to hydrate well with water throughout the day.  Fatigue - given recent low readings and history of low testosterone, could be multifactorial.  Updated labs today at his request  Low testosterone-updated labs today.  He is currently only on 1 packet daily of topical testosterone 1%.  Will likely increase the dose pending labs    There are no Patient Instructions on file for this visit.  Keon was seen today for follow-up.  Diagnoses and all orders for this visit:  Essential hypertension -     CBC -     Comprehensive metabolic panel  Low testosterone -     Testosterone  Fatigue, unspecified type -     CBC -     Comprehensive metabolic panel  Dizziness -     CBC -     Comprehensive metabolic panel  Low blood pressure reading    Follow up: pending labs

## 2023-02-09 ENCOUNTER — Other Ambulatory Visit: Payer: Self-pay | Admitting: Medical

## 2023-02-09 LAB — COMPREHENSIVE METABOLIC PANEL
ALT: 8 [IU]/L (ref 0–44)
AST: 20 [IU]/L (ref 0–40)
Albumin: 4.4 g/dL (ref 3.8–4.8)
Alkaline Phosphatase: 80 [IU]/L (ref 44–121)
BUN/Creatinine Ratio: 18 (ref 10–24)
BUN: 24 mg/dL (ref 8–27)
Bilirubin Total: 0.7 mg/dL (ref 0.0–1.2)
CO2: 24 mmol/L (ref 20–29)
Calcium: 9.8 mg/dL (ref 8.6–10.2)
Chloride: 98 mmol/L (ref 96–106)
Creatinine, Ser: 1.31 mg/dL — ABNORMAL HIGH (ref 0.76–1.27)
Globulin, Total: 2.8 g/dL (ref 1.5–4.5)
Glucose: 91 mg/dL (ref 70–99)
Potassium: 4.7 mmol/L (ref 3.5–5.2)
Sodium: 138 mmol/L (ref 134–144)
Total Protein: 7.2 g/dL (ref 6.0–8.5)
eGFR: 58 mL/min/{1.73_m2} — ABNORMAL LOW (ref >59)

## 2023-02-09 LAB — CBC
Hematocrit: 39.1 % (ref 37.5–51.0)
Hemoglobin: 12.6 g/dL — ABNORMAL LOW (ref 13.0–17.7)
MCH: 28.1 pg (ref 26.6–33.0)
MCHC: 32.2 g/dL (ref 31.5–35.7)
MCV: 87 fL (ref 79–97)
Platelets: 198 10*3/uL (ref 150–450)
RBC: 4.49 x10E6/uL (ref 4.14–5.80)
RDW: 13.6 % (ref 11.6–15.4)
WBC: 6.7 10*3/uL (ref 3.4–10.8)

## 2023-02-09 LAB — TESTOSTERONE: Testosterone: 369 ng/dL (ref 264–916)

## 2023-02-09 NOTE — Progress Notes (Signed)
Results sent through MyChart

## 2023-02-20 NOTE — Telephone Encounter (Signed)
P.A. denied pt needs trial & failure of androderm or testosterone 1% or 1.62%, Pt is on 1 % currently & stable.

## 2023-02-25 ENCOUNTER — Other Ambulatory Visit: Payer: Self-pay | Admitting: Medical

## 2023-02-25 ENCOUNTER — Telehealth: Payer: Self-pay | Admitting: Medical

## 2023-02-25 DIAGNOSIS — R9389 Abnormal findings on diagnostic imaging of other specified body structures: Secondary | ICD-10-CM

## 2023-02-25 NOTE — Telephone Encounter (Signed)
Pt called and states at last visit that it was discussed about him getting a lung xray and he would like to have that done  Please call

## 2023-02-25 NOTE — Telephone Encounter (Signed)
Left voicemail for patient and send MyChart message

## 2023-02-28 ENCOUNTER — Ambulatory Visit
Admission: RE | Admit: 2023-02-28 | Discharge: 2023-02-28 | Disposition: A | Discharge status: Home / Self Care | Payer: 59 | Source: Ambulatory Visit | Attending: Medical | Admitting: Medical

## 2023-02-28 DIAGNOSIS — R9389 Abnormal findings on diagnostic imaging of other specified body structures: Secondary | ICD-10-CM

## 2023-03-01 ENCOUNTER — Other Ambulatory Visit: Payer: Self-pay | Admitting: Medical

## 2023-03-01 MED ORDER — PREDNISONE 10 MG PO TABS: ORAL_TABLET | ORAL | 0 refills | Status: DC

## 2023-03-01 MED ORDER — AZITHROMYCIN 250 MG PO TABS: ORAL_TABLET | ORAL | 0 refills | Status: DC

## 2023-03-01 NOTE — Progress Notes (Signed)
Chest x-ray shows abnormal findings in the left lower lung.  Begin trial of Z-Pak antibiotic and prednisone.  If agreeable they recommended possibly doing a CT.  We can either do this or refer to lung doctor.  See if he wants to do a CT versus referral onto lung doctor

## 2023-03-02 ENCOUNTER — Other Ambulatory Visit: Payer: Self-pay | Admitting: Internal Medicine

## 2023-03-02 DIAGNOSIS — R9389 Abnormal findings on diagnostic imaging of other specified body structures: Secondary | ICD-10-CM

## 2023-03-11 ENCOUNTER — Telehealth: Payer: Self-pay | Admitting: Medical

## 2023-03-11 ENCOUNTER — Institutional Professional Consult (permissible substitution): Payer: 59 | Admitting: Internal Medicine

## 2023-03-11 NOTE — Telephone Encounter (Signed)
Left message for pt to call.

## 2023-03-11 NOTE — Telephone Encounter (Signed)
Please call , pt wants to have another lung xray to make sure his lungs have cleared up

## 2023-03-14 NOTE — Telephone Encounter (Signed)
Left message for pt to call me back 

## 2023-03-15 NOTE — Telephone Encounter (Signed)
 Left message for pt to call me back

## 2023-03-17 NOTE — Telephone Encounter (Signed)
 He is 100% better but just wanted to follow-up CT Scan. Please order

## 2023-03-18 ENCOUNTER — Other Ambulatory Visit: Payer: Self-pay | Admitting: Medical

## 2023-03-18 DIAGNOSIS — R053 Chronic cough: Secondary | ICD-10-CM

## 2023-03-18 DIAGNOSIS — R9389 Abnormal findings on diagnostic imaging of other specified body structures: Secondary | ICD-10-CM

## 2023-03-31 ENCOUNTER — Other Ambulatory Visit: Payer: Self-pay | Admitting: Medical

## 2023-04-01 NOTE — Telephone Encounter (Signed)
Last apt 02/08/23

## 2023-04-03 ENCOUNTER — Other Ambulatory Visit: Payer: Self-pay | Admitting: Medical

## 2023-04-05 ENCOUNTER — Other Ambulatory Visit: Payer: 59

## 2023-04-08 ENCOUNTER — Encounter: Payer: Self-pay | Admitting: Internal Medicine

## 2023-04-08 ENCOUNTER — Ambulatory Visit: Payer: 59 | Attending: Internal Medicine | Admitting: Internal Medicine

## 2023-04-08 VITALS — BP 114/68 | HR 71 | Ht 68.0 in | Wt 144.4 lb

## 2023-04-08 DIAGNOSIS — I8392 Asymptomatic varicose veins of left lower extremity: Secondary | ICD-10-CM | POA: Diagnosis not present

## 2023-04-08 DIAGNOSIS — I10 Essential (primary) hypertension: Secondary | ICD-10-CM

## 2023-04-08 MED ORDER — ROSUVASTATIN CALCIUM 10 MG PO TABS: 10.0000 mg | ORAL_TABLET | Freq: Every day | ORAL | 3 refills | Status: DC

## 2023-04-08 NOTE — Patient Instructions (Signed)
Medication Instructions:  No changes  *If you need a refill on your cardiac medications before your next appointment, please call your pharmacy*   Lab Work: None  If you have labs (blood work) drawn today and your tests are completely normal, you will receive your results only by: MyChart Message (if you have MyChart) OR A paper copy in the mail If you have any lab test that is abnormal or we need to change your treatment, we will call you to review the results.   Testing/Procedures: None    Follow-Up: At [facility], you and your health needs are our priority.  As part of our continuing mission to provide you with exceptional heart care, we have created designated Provider Care Teams.  These Care Teams include your primary Cardiologist (physician) and Advanced Practice Providers (APPs -  Physician Assistants and Nurse Practitioners) who all work together to provide you with the care you need, when you need it.   Your next appointment:   6 months   Provider:   Any APP  Other Instructions

## 2023-04-08 NOTE — Progress Notes (Signed)
Cardiology Office Note:    Date:  04/08/2023   ID:  Logan Phillips, DOB [birth year], MRN [id number]  PCP:  Genia Del   [facility] Providers Cardiologist:  None     Referring MD: Jac Canavan, PA-C   No chief complaint on file. Fatigue  History of Present Illness:    Logan Phillips is a 73 y.o. male with no cardiac dx hx, referral for an abnormal echo.   The patient reports experiencing fatigue and occasional dizziness when bending over for the past couple of months. Blood work was normal except for low testosterone levels, for which the patient has started testosterone therapy. The patient has a history of heart catheterization in 1995 which he states was normal, no PCI. He underwent an EKG with his PCP which showed TWI anterolaterally. His echo showed posterior hypokinesis towards the apex The patient also has a history of uncontrolled blood pressure, now improved with medication.   EKG in June showed anterolateral TWI.  He denies angina, dyspnea on exertion.  Interim hx 04/08/2023 He comes in today for follow-up appointment.  Prior documented blood pressures have been normal.  He is at a healthy weight. He says sometimes his Bps fluctuate. Can get up to 160 sometimes. He notes he takes BP meds only if elevated. If it is normal and he takes Bp meds he gets dizziness. He denies chest pressure.    Current Medications: Current Outpatient Medications on File Prior to Visit  Medication Sig Dispense Refill   acetaminophen (TYLENOL) 500 MG tablet Take 1,000 mg by mouth every 6 (six) hours as needed.     allopurinol (ZYLOPRIM) 100 MG tablet TAKE 1 TABLET BY MOUTH EVERY DAY 90 tablet 1   aspirin EC 81 MG tablet Take 1 tablet (81 mg total) by mouth daily. Swallow whole.     azithromycin (ZITHROMAX) 250 MG tablet 2 tablets day 1, then 1 tablet days 2-4 6 tablet 0   colchicine 0.6 MG tablet Take 1 tablet (0.6 mg total) by mouth 2 (two) times daily as needed. 60  tablet 0   CRANBERRY PO Take by mouth.     cyclobenzaprine (FLEXERIL) 10 MG tablet TAKE 1 TABLET BY MOUTH THREE TIMES DAILY 90 tablet 0   diphenhydrAMINE (BENADRYL) 25 MG tablet Take 25 mg by mouth every 6 (six) hours as needed.     lisinopril (ZESTRIL) 10 MG tablet 1 tablet morning, 1/2 tablet afternoon 180 tablet 1   magnesium oxide (MAG-OX) 400 MG tablet Take 400 mg by mouth 2 (two) times daily.     meloxicam (MOBIC) 15 MG tablet TAKE 1 TABLET(15 MG) BY MOUTH DAILY 90 tablet 0   MILK THISTLE PO Take by mouth.     Multiple Vitamin (MULITIVITAMIN WITH MINERALS) TABS Take 1 tablet by mouth daily.     Oxycodone HCl 10 MG TABS Take 10 mg by mouth 5 (five) times daily.     predniSONE (DELTASONE) 10 MG tablet 6 tablets all together day 1, 5 tablets day 2, 4 tablets day 3, 3 tablets day 4, 2 tablets day 5, 1 tablet day 6. 21 tablet 0   Testosterone 25 MG/2.5GM (1%) GEL PLACE 2 PUMPS/ PACKETS ONTO THE SKIN DAILY 75 g 2   rosuvastatin (CRESTOR) 10 MG tablet Take 1 tablet (10 mg total) by mouth daily. 90 tablet 3   No current facility-administered medications on file prior to visit.    Allergies:   Patient has no known allergies.  Family History: The patient's family history includes Cancer in his maternal grandmother; Dementia in his father; Diabetes in his father; Hypertension in his mother; Parkinsonism in his father. There is no history of Heart disease or Stroke.  ROS:   Please see the history of present illness.     All other systems reviewed and are negative.  EKGs/Labs/Other Studies Reviewed:    The following studies were reviewed today:       Recent Labs: 08/13/2022: TSH 1.730 02/08/2023: ALT 8; BUN 24; Creatinine, Ser 1.31; Hemoglobin 12.6; Platelets 198; Potassium 4.7; Sodium 138   Recent Lipid Panel    Component Value Date/Time   CHOL 160 08/04/2022 1418   TRIG 125 08/04/2022 1418   HDL 55 08/04/2022 1418   CHOLHDL 2.9 08/04/2022 1418   CHOLHDL 2.8 10/04/2016 0826    VLDL 25 10/04/2016 0826   LDLCALC 83 08/04/2022 1418     Risk Assessment/Calculations:        Physical Exam:    VS:  Vitals:   04/08/23 1136  BP: 114/68  Pulse: 71  SpO2: 93%     Wt Readings from Last 3 Encounters:  02/08/23 141 lb 6.4 oz (64.1 kg)  12/01/22 145 lb (65.8 kg)  11/01/22 139 lb 6.4 oz (63.2 kg)     GEN: Belll's Palsys, Well nourished, well developed in no acute distress HEENT: Normal NECK: No JVD; No carotid bruits LYMPHATICS: No lymphadenopathy CARDIAC: RRR, no murmurs, rubs, gallops RESPIRATORY:  Clear to auscultation without rales, wheezing or rhonchi  ABDOMEN: Soft, non-tender, non-distended MUSCULOSKELETAL:  No edema; No deformity  SKIN: Warm and dry NEUROLOGIC:  Alert and oriented x 3 PSYCHIATRIC:  Normal affect   ASSESSMENT:    Posterior hypokinesis towards the apex on TTE in July  EKG showed anterolateral TWIs His SPECT showed average METs, hypertensive response.  He had ST depressions in the inferior lateral leads and PVCs.  However study was negative for inducible ischemia or infarction.LVEF was normal in August 2024.  EKG showed anterolateral T wave inversions.  Could possibly be circumflex disease. He had a cath in 1995 that showed ostial RCA spasm.  No obstructive coronary disease.  If he were to have recurrent chest pain would have low threshold to consider a coronary CTA.  -Can continue aspirin 81 mg daily and Crestor 10 mg daily   HTN: well controlled. continue lisinopril 10 mg daily   PLAN:    In order of problems listed above:   Follow up 6 months with an APP      Informed Consent   Shared Decision Making/Informed Consent The risks [chest pain, shortness of breath, cardiac arrhythmias, dizziness, blood pressure fluctuations, myocardial infarction, stroke/transient ischemic attack, nausea, vomiting, allergic reaction, radiation exposure, metallic taste sensation and life-threatening complications (estimated to be 1 in  10,000)], benefits (risk stratification, diagnosing coronary artery disease, treatment guidance) and alternatives of a nuclear stress test were discussed in detail with Mr. Sagan and he agrees to proceed.       Medication Adjustments/Labs and Tests Ordered: Current medicines are reviewed at length with the patient today.  Concerns regarding medicines are outlined above.  No orders of the defined types were placed in this encounter.  No orders of the defined types were placed in this encounter.   There are no Patient Instructions on file for this visit.   Signed, Maisie Fus, MD  04/08/2023 11:23 AM    [facility]

## 2023-04-15 ENCOUNTER — Other Ambulatory Visit: Payer: Self-pay | Admitting: Medical

## 2023-04-22 ENCOUNTER — Other Ambulatory Visit: Payer: Self-pay | Admitting: Medical

## 2023-04-27 ENCOUNTER — Other Ambulatory Visit: Payer: Self-pay | Admitting: Medical

## 2023-04-27 ENCOUNTER — Other Ambulatory Visit: Payer: 59

## 2023-04-27 DIAGNOSIS — R053 Chronic cough: Secondary | ICD-10-CM

## 2023-04-27 DIAGNOSIS — R9389 Abnormal findings on diagnostic imaging of other specified body structures: Secondary | ICD-10-CM

## 2023-05-02 ENCOUNTER — Institutional Professional Consult (permissible substitution): Payer: 59 | Admitting: Internal Medicine

## 2023-05-16 ENCOUNTER — Ambulatory Visit (INDEPENDENT_AMBULATORY_CARE_PROVIDER_SITE_OTHER): Payer: 59 | Admitting: Medical

## 2023-05-16 VITALS — BP 110/70 | HR 82 | Temp 97.1°F | Wt 145.6 lb

## 2023-05-16 DIAGNOSIS — Z87898 Personal history of other specified conditions: Secondary | ICD-10-CM

## 2023-05-16 DIAGNOSIS — I10 Essential (primary) hypertension: Secondary | ICD-10-CM

## 2023-05-16 DIAGNOSIS — R918 Other nonspecific abnormal finding of lung field: Secondary | ICD-10-CM | POA: Insufficient documentation

## 2023-05-16 DIAGNOSIS — I2510 Atherosclerotic heart disease of native coronary artery without angina pectoris: Secondary | ICD-10-CM

## 2023-05-16 DIAGNOSIS — M545 Low back pain, unspecified: Secondary | ICD-10-CM

## 2023-05-16 DIAGNOSIS — R5382 Chronic fatigue, unspecified: Secondary | ICD-10-CM | POA: Diagnosis not present

## 2023-05-16 DIAGNOSIS — G8929 Other chronic pain: Secondary | ICD-10-CM

## 2023-05-16 DIAGNOSIS — R9389 Abnormal findings on diagnostic imaging of other specified body structures: Secondary | ICD-10-CM | POA: Insufficient documentation

## 2023-05-16 DIAGNOSIS — M25511 Pain in right shoulder: Secondary | ICD-10-CM

## 2023-05-16 DIAGNOSIS — L729 Follicular cyst of the skin and subcutaneous tissue, unspecified: Secondary | ICD-10-CM

## 2023-05-16 MED ORDER — LIDOCAINE 0.5 % EX GEL: 1.0000 | Freq: Two times a day (BID) | CUTANEOUS | 0 refills | Status: DC

## 2023-05-16 MED ORDER — AMPHETAMINE-DEXTROAMPHETAMINE 10 MG PO TABS: 10.0000 mg | ORAL_TABLET | Freq: Every day | ORAL | 0 refills | Status: DC

## 2023-05-16 MED ORDER — DICLOFENAC SODIUM 1 % EX GEL: 2.0000 g | Freq: Four times a day (QID) | CUTANEOUS | 2 refills | Status: DC

## 2023-05-16 NOTE — Progress Notes (Signed)
 Subjective:  Logan Phillips is a 73 y.o. male who presents for Chief Complaint  Patient presents with   Fatigue    Fatigue- been going on for a while.      Here for med check, ongoing chronic fatigue since prior brain tumor surgery.  He has complained of fatigue for quite a while, several years.  He is compliant with his testosterone therapy, 2 packets daily.  Saw cardiology recently, although he reports BPs all over the place, sometimes high, sometimes low as low as 85/65 and as high as diastolic in the 160s, he takes his medicine lisinopril 10 mg daily when he has elevated readings or does not take it at all when he has low readings with normal readings.  This seems to be managing his blood pressure relatively well.  He was getting hypotensive on beta-blocker metoprolol last year.  He does take magnesium supplement daily.  He is not taking milk thistle supplement regularly.  He would like to go on Adderall 10 mg low dose.  He has been on this before and it helped his fatigue.  He would like prescriptions for lidocaine gel and Voltaren gel to use as needed for pains.  He has chronic back pain and chronic right shoulder pain.  He notes multiple rotator cuff tears in the right shoulder and the right now is holding off on any type of surgery.  He had a prior abnormal chest x-ray and abnormal lung fields.  He does have a CT chest scheduled on March 10 coming up  He has concerns of cyst of skin on his left lateral hip and right parietal scalp.  They have been there a while.  He has had prior lipomas and cyst removed.  He would like these to be removed.  No other aggravating or relieving factors.    No other c/o.  Past Medical History:  Diagnosis Date   Chronic pain in shoulder 2018   [facility]   H/O echocardiogram 10/08/2016   EF 65-70%, normal wall motion, systolic function vigorous   Hearing loss    pending hearing aids 06/2014   Hypertension    Lipoma    Varicose  vein    Vestibular schwannoma (HCC) 04/2018   Current Outpatient Medications on File Prior to Visit  Medication Sig Dispense Refill   acetaminophen (TYLENOL) 500 MG tablet Take 1,000 mg by mouth every 6 (six) hours as needed.     allopurinol (ZYLOPRIM) 100 MG tablet TAKE 1 TABLET BY MOUTH EVERY DAY 90 tablet 1   aspirin EC 81 MG tablet Take 1 tablet (81 mg total) by mouth daily. Swallow whole.     colchicine 0.6 MG tablet Take 1 tablet (0.6 mg total) by mouth 2 (two) times daily as needed. 60 tablet 0   CRANBERRY PO Take by mouth.     cyclobenzaprine (FLEXERIL) 10 MG tablet TAKE 1 TABLET BY MOUTH THREE TIMES DAILY 90 tablet 0   diphenhydrAMINE (BENADRYL) 25 MG tablet Take 25 mg by mouth every 6 (six) hours as needed.     lisinopril (ZESTRIL) 10 MG tablet 1 tablet morning, 1/2 tablet afternoon 180 tablet 1   meloxicam (MOBIC) 15 MG tablet TAKE 1 TABLET(15 MG) BY MOUTH DAILY 90 tablet 0   MILK THISTLE PO Take by mouth.     Multiple Vitamin (MULITIVITAMIN WITH MINERALS) TABS Take 1 tablet by mouth daily.     Oxycodone HCl 10 MG TABS Take 10 mg by mouth 5 (five) times  daily.     rosuvastatin (CRESTOR) 10 MG tablet Take 1 tablet (10 mg total) by mouth daily. 90 tablet 3   Testosterone 25 MG/2.5GM (1%) GEL PLACE 2 PUMPS/ PACKETS ONTO THE SKIN DAILY 75 g 2   magnesium oxide (MAG-OX) 400 MG tablet Take 400 mg by mouth 2 (two) times daily.     No current facility-administered medications on file prior to visit.     The following portions of the patient's history were reviewed and updated as appropriate: allergies, current medications, past family history, past medical history, past social history, past surgical history and problem list.  ROS Otherwise as in subjective above  Objective: BP 110/70   Pulse 82   Temp (!) 97.1 F (36.2 C)   Wt 145 lb 9.6 oz (66 kg)   BMI 22.14 kg/m   Wt Readings from Last 3 Encounters:  05/16/23 145 lb 9.6 oz (66 kg)  04/08/23 144 lb 6.4 oz (65.5 kg)   02/08/23 141 lb 6.4 oz (64.1 kg)    General appearance: alert, no distress, well developed, well nourished Decreased lung fields left lower, otherwise no wheezes, rhonchi, or rales Heart: Regular rate rhythm, normal S1-S2, no murmurs Pulses: 2+ radial pulses, 2+ pedal pulses, normal cap refill Ext: no edema Skin: Left lateral hip with approximately 2 to 3 mm diameter mobile cystic lesion subcutaneous, there is a similar 3 to 4 mm diameter of subcutaneous cyst of the right parietal scalp. Extremities: No edema   Assessment: Encounter Diagnoses  Name Primary?   Chronic fatigue Yes   History of brain tumor    Essential hypertension    Coronary artery disease involving native coronary artery of native heart without angina pectoris    Chronic right shoulder pain    Chronic low back pain without sciatica, unspecified back pain laterality    Cyst of skin    Abnormal chest x-ray    Abnormal lung field      Plan: Chronic fatigue-multifactorial.  Updated labs as below.  Begin trial of Adderall.  He has been on this before and felt like it really helped.  He continues on his testosterone therapy.  His levels have been adequate and we have checked it a few months ago.  Going forward consider PFTs given his prior lung scan  Hypertension-he just saw cardiology recently, visit notes reviewed from January 24th, 2025.  He basically continues on lisinopril 10 mg on days where his blood pressure is elevated and does not take his medicine and his blood pressure is normal.  He was getting hypotension with beta-blocker prior to metoprolol in 2024.  He will continue his current regimen at this time  CAD-continue aspirin 81 mg and rosuvastatin 10 mg daily  Chronic right shoulder pain, chronic low back pain-prescriptions for Voltaren and lidocaine to use as needed topically.  At some point he is going to follow-up with orthopedic about rotator cuff tear  Cyst of skin on the right parietal scalp and left  lateral hip.  We discussed his concerns.  Consider referral to dermatology or he can come back on a separate visit to attempt to resolve the cyst on his left hip which is very tiny.  Abnormal chest x-ray and lung fields.  He finally has his chest CT repeat coming up next week.  We recommended this back in December.  He was initially not agreeable.  He will also need to likely follow-up with pulmonology and have an updated PFT as well in case that  is playing a role with his fatigue  [place] was seen today for fatigue.  Diagnoses and all orders for this visit:  Chronic fatigue -     TSH -     Sedimentation rate -     CBC with Differential/Platelet -     ANA  History of brain tumor  Essential hypertension  Coronary artery disease involving native coronary artery of native heart without angina pectoris  Chronic right shoulder pain  Chronic low back pain without sciatica, unspecified back pain laterality -     Sedimentation rate -     CBC with Differential/Platelet -     ANA  Cyst of skin  Abnormal chest x-ray  Abnormal lung field  Other orders -     diclofenac Sodium (VOLTAREN) 1 % GEL; Apply 2 g topically 4 (four) times daily. -     Lidocaine 0.5 % GEL; Apply 1 Application topically in the morning and at bedtime. -     amphetamine-dextroamphetamine (ADDERALL) 10 MG tablet; Take 1 tablet (10 mg total) by mouth daily with breakfast.    Follow up: pending labs, CT chest

## 2023-05-17 ENCOUNTER — Other Ambulatory Visit: Payer: Self-pay | Admitting: Medical

## 2023-05-17 LAB — CBC WITH DIFFERENTIAL/PLATELET
Basophils Absolute: 0 10*3/uL (ref 0.0–0.2)
Basos: 1 %
EOS (ABSOLUTE): 0.2 10*3/uL (ref 0.0–0.4)
Eos: 3 %
Hematocrit: 41 % (ref 37.5–51.0)
Hemoglobin: 13.7 g/dL (ref 13.0–17.7)
Immature Grans (Abs): 0 10*3/uL (ref 0.0–0.1)
Immature Granulocytes: 0 %
Lymphocytes Absolute: 1.5 10*3/uL (ref 0.7–3.1)
Lymphs: 19 %
MCH: 28.7 pg (ref 26.6–33.0)
MCHC: 33.4 g/dL (ref 31.5–35.7)
MCV: 86 fL (ref 79–97)
Monocytes Absolute: 0.5 10*3/uL (ref 0.1–0.9)
Monocytes: 7 %
Neutrophils Absolute: 5.2 10*3/uL (ref 1.4–7.0)
Neutrophils: 70 %
Platelets: 232 10*3/uL (ref 150–450)
RBC: 4.78 x10E6/uL (ref 4.14–5.80)
RDW: 13.3 % (ref 11.6–15.4)
WBC: 7.5 10*3/uL (ref 3.4–10.8)

## 2023-05-17 LAB — TSH: TSH: 0.858 u[IU]/mL (ref 0.450–4.500)

## 2023-05-17 LAB — ANA: Anti Nuclear Antibody (ANA): NEGATIVE

## 2023-05-17 LAB — SEDIMENTATION RATE: Sed Rate: 10 mm/h (ref 0–30)

## 2023-05-17 MED ORDER — LIDOCAINE 5 % EX PTCH: 1.0000 | MEDICATED_PATCH | CUTANEOUS | 1 refills | Status: DC

## 2023-05-17 MED ORDER — DICLOFENAC SODIUM 3 % EX GEL: 1.0000 | Freq: Three times a day (TID) | CUTANEOUS | 1 refills | Status: DC | PRN

## 2023-05-17 NOTE — Telephone Encounter (Signed)
 Copied from CRM [id number]. Topic: Clinical - Medication Refill >> May 17, 2023  1:11 PM Payton Doughty wrote: Most Recent Primary Care Visit:  Provider: Jac Canavan  Department: [facility]  Visit Type: ACUTE  Date: 05/16/2023  Medication: 1. diclofenac Sodium (VOLTAREN) 1 % GEL Pt needs 3% Voltaren gel.  He says the 1% is not strong enough.  2. Pt states he needs the prescription strength Lidocaine.  What the dr prescribed was OTC strength.   Has the patient contacted their pharmacy? No Pt just saw the dr yesterday.  These meds are OTC, and he needs prescription strength. Is this the correct pharmacy for this prescription? Yes If no, delete pharmacy and type the correct one.  This is the patient's preferred pharmacy:  [id number] [place], [place] - [address] AT [facility] [address] [facility] [address] [place] [address] Phone: [phone] Fax: [phone]   Has the prescription been filled recently? Yes  Is the patient out of the medication? Yes  Has the patient been seen for an appointment in the last year OR does the patient have an upcoming appointment? Yes  Can we respond through MyChart? Yes  Agent: Please be advised that Rx refills may take up to 3 business days. We ask that you follow-up with your pharmacy.

## 2023-05-17 NOTE — Progress Notes (Signed)
 Results sent through MyChart

## 2023-05-18 ENCOUNTER — Other Ambulatory Visit (HOSPITAL_COMMUNITY): Payer: Self-pay

## 2023-05-18 ENCOUNTER — Telehealth: Payer: Self-pay

## 2023-05-18 NOTE — Telephone Encounter (Signed)
 Pharmacy Patient Advocate Encounter   Received notification from CoverMyMeds that prior authorization for Diclofenac Sodium 3% gel is required/requested.   Insurance verification completed.   The patient is insured through [facility] .   Per test claim: PA required; PA submitted to above mentioned insurance via CoverMyMeds Key/confirmation #/EOC (Key: [id number])   Status is pending

## 2023-05-19 NOTE — Telephone Encounter (Signed)
 Pharmacy Patient Advocate Encounter  Received notification from [facility] that Prior Authorization for  Diclofenac Sodium 3% gel  has been DENIED.  Full denial letter will be uploaded to the media tab. See denial reason below.        PA #/Case ID/Reference #: (Key: [id number])

## 2023-05-23 ENCOUNTER — Ambulatory Visit
Admission: RE | Admit: 2023-05-23 | Discharge: 2023-05-23 | Disposition: A | Discharge status: Home / Self Care | Payer: 59 | Source: Ambulatory Visit | Attending: Medical | Admitting: Medical

## 2023-05-23 ENCOUNTER — Telehealth: Payer: Self-pay | Admitting: Pharmacist

## 2023-05-23 DIAGNOSIS — R9389 Abnormal findings on diagnostic imaging of other specified body structures: Secondary | ICD-10-CM

## 2023-05-23 DIAGNOSIS — R053 Chronic cough: Secondary | ICD-10-CM

## 2023-05-23 NOTE — Telephone Encounter (Signed)
 Appeal has been submitted diclofenac 3% gel. Will advise when response is received, please be advised that most companies may take 30 days to make a decision. Appeal letter and supporting documentation have been faxed to [phone] on 05/23/2023 @3 :04 pm.    Thank you, Dellie Burns, PharmD Clinical Pharmacist  [place]  Direct Dial: [phone]

## 2023-05-23 NOTE — Telephone Encounter (Signed)
 Left detailed message about denial

## 2023-05-24 NOTE — Progress Notes (Signed)
 I am sending this to all 3 of you as I am not sure who would see if the soonest or who is in the office  This patient has chronic fatigue complaints which is what brought him in recently.  Recent white count normal but his CT is abnormal.  Please look at his recent CT chest done yesterday 05/23/2023  Given the findings, curious if he needs to be seen urgently with you guys or what you do recommend as far as appointment with you guys.  At his recent visit on 05/16/2023 he mainly complained of fatigue not chronic cough or fever congestion or other respiratory symptoms, more decreased energy and fatigue than anything.  I appreciate your help

## 2023-05-25 NOTE — Telephone Encounter (Signed)
 Additional information has been requested from the patient's insurance in order to proceed with the appeal request. Requested information has been sent, or form has been filled out and faxed back to [phone]

## 2023-05-27 ENCOUNTER — Other Ambulatory Visit: Payer: Self-pay | Admitting: Medical

## 2023-06-14 ENCOUNTER — Other Ambulatory Visit (HOSPITAL_COMMUNITY): Payer: Self-pay

## 2023-06-21 ENCOUNTER — Ambulatory Visit: Payer: 59 | Admitting: Internal Medicine

## 2023-06-21 ENCOUNTER — Encounter: Payer: Self-pay | Admitting: Internal Medicine

## 2023-06-21 VITALS — BP 134/80 | HR 74 | Ht 67.0 in | Wt 145.0 lb

## 2023-06-21 DIAGNOSIS — F1090 Alcohol use, unspecified, uncomplicated: Secondary | ICD-10-CM

## 2023-06-21 DIAGNOSIS — J189 Pneumonia, unspecified organism: Secondary | ICD-10-CM

## 2023-06-21 DIAGNOSIS — K089 Disorder of teeth and supporting structures, unspecified: Secondary | ICD-10-CM | POA: Diagnosis not present

## 2023-06-21 DIAGNOSIS — J984 Other disorders of lung: Secondary | ICD-10-CM | POA: Diagnosis not present

## 2023-06-21 MED ORDER — AMOXICILLIN-POT CLAVULANATE 875-125 MG PO TABS
1.0000 | ORAL_TABLET | Freq: Two times a day (BID) | ORAL | 0 refills | Status: DC
Start: 2023-06-21 — End: 2023-07-27

## 2023-06-21 NOTE — Patient Instructions (Addendum)
 VISIT SUMMARY:  During your visit, we discussed your ongoing shortness of breath and changes observed in your chest imaging. We reviewed your history of aspiration pneumonia and the potential contributing factors, including your current medications and lifestyle habits. We also addressed your alcohol use and dental health, which may be impacting your lung condition.  YOUR PLAN:  -CHRONIC ASPIRATION PNEUMONIA: Chronic aspiration pneumonia is a lung infection caused by inhaling food, liquid, or vomit into your lungs. We will treat this with a four-week course of antibiotics with augmentin  and follow up with a repeat chest CT scan. It is important to stop snorting pain pills as this may worsen your condition. We may also conduct a barium swallow study to check your swallowing function and consider a bronchoscopy if the issues persist.  -ALCOHOL USE DISORDER: Alcohol use disorder is a condition where you have difficulty controlling your drinking despite negative consequences. Reducing your alcohol consumption is important as it increases the risk of pneumonia and other lung issues.  -POOR DENTAL HEALTH: Poor dental health can increase the risk of lung infections like pneumonia. We recommend seeing a dentist and considering dentures to improve your oral health and reduce the risk of further lung infections.  INSTRUCTIONS:  Please complete the four-week course of antibiotics as prescribed. Schedule a follow-up chest CT scan after finishing the antibiotics. It is crucial to stop snorting pain pills immediately. Consider reducing your alcohol intake and seek a dental consultation to address your oral health. If your symptoms persist, we may need to perform additional tests such as a barium swallow study or bronchoscopy.

## 2023-06-21 NOTE — Progress Notes (Addendum)
 Logan Phillips    [id number]    [birth year]  Primary Care Physician:Tysinger, Alm RAMAN, PA-C  Referring Physician: Bulah Alm RAMAN, PA-C [address] [place],  [place] [address] Reason for Consultation: abnormal chest xray Date of Consultation: 06/21/2023  Chief complaint:   Chief Complaint  Patient presents with   Consult    Abnormal CXR. Patient states he's been feeling fatigue.     HPI:  Discussed the use of AI scribe software for clinical note transcription with the patient, who gave verbal consent to proceed.  History of Present Illness Logan Phillips is a 73 year old male who presents with shortness of breath and changes on chest imaging.  He has experienced shortness of breath for approximately a year, initially unable to walk more than thirty feet without stopping to catch his breath. His work in Therapist, music care, which requires significant physical activity, exacerbates this symptom. Despite treatment with antibiotics during a previous hospitalization, he has not fully recovered and reports less energy than usual.  In September 2023, he was admitted for aspiration pneumonia, treated with IV and oral antibiotics, resulting in improvement. However, shortness of breath returned a few months later, though less severe. He occasionally experiences a sensation of aspiration but not frequently.  He was hospitalized a year and a half ago due to a lung problem identified on a CT scan, though he does not recall which lung was affected. Treatment with antibiotics led to some improvement, but he has not felt fully recovered since then.  He takes a low dose of Ritalin on workdays for energy and snorts prescribed oxycodone  five times a day. He drinks two to three alcoholic drinks daily. He has never smoked.  He lives with a roommate who is like an uncle and manages daily activities at a slower pace due to his symptoms.  No trouble swallowing, nausea, vomiting, or chest pain when  eating. Occasional sensation of aspiration and recent congestion with a small cough at night. He acknowledges poor dental health, attributing it to age and financial constraints.   Social history:  Occupation: works in Catering manager.  Exposures: lives at home with an uncle.  Smoking history: never smoker.   Social History   Occupational History   Not on file  Tobacco Use   Smoking status: Never   Smokeless tobacco: Never  Vaping Use   Vaping status: Never Used  Substance and Sexual Activity   Alcohol use: Not Currently    Alcohol/week: 20.0 standard drinks of alcohol    Types: 20 Shots of liquor per week    Comment: stopped 3 weeks ago 04/12/2018   Drug use: No   Sexual activity: Not on file    Relevant family history:  Family History  Problem Relation Age of Onset   Hypertension Mother    Dementia Father    Parkinsonism Father    Diabetes Father        early stages   Cancer Maternal Grandmother        breast   Heart disease Neg Hx    Stroke Neg Hx     Past Medical History:  Diagnosis Date   Chronic pain in shoulder 2018   [facility]   H/O echocardiogram 10/08/2016   EF 65-70%, normal wall motion, systolic function vigorous   Hearing loss    pending hearing aids 06/2014   Hypertension    Lipoma    Varicose vein  Vestibular schwannoma (HCC) 04/2018    Past Surgical History:  Procedure Laterality Date   APPENDECTOMY     APPLICATION OF CRANIAL NAVIGATION Right 05/09/2018   Procedure: APPLICATION OF CRANIAL NAVIGATION;  Surgeon: Lanis Pupa, MD;  Location: [facility] OR;  Service: Neurosurgery;  Laterality: Right;   BIOPSY  04/12/2018   Procedure: BIOPSY;  Surgeon: Kristie Lamprey, MD;  Location: [facility] ENDOSCOPY;  Service: Endoscopy;;   COLONOSCOPY     age 34, he did not f/u with referral 2018   COLONOSCOPY WITH PROPOFOL  N/A 04/12/2018   Procedure: COLONOSCOPY WITH PROPOFOL ;  Surgeon: Kristie Lamprey, MD;  Location: [facility] ENDOSCOPY;  Service: Endoscopy;   Laterality: N/A;   CRANIOTOMY Right 05/09/2018   Procedure: Right retrosigmoid craniectomy, resection of acoustic neuroma with intraoperative facial monitoring/Brain Lab;  Surgeon: Lanis Pupa, MD;  Location: [facility] OR;  Service: Neurosurgery;  Laterality: Right;   LUMBAR DISC SURGERY  2002   Dr. Mora   WISDOM TOOTH EXTRACTION       Physical Exam: Blood pressure 134/80, pulse 74, height 5' 7 (1.702 m), weight 145 lb (65.8 kg), SpO2 100%. Gen:      No acute distress ENT:  poor dentition, multiple caries and missing teeth. no nasal polyps, mucus membranes moist Lungs:    No increased respiratory effort, symmetric chest wall excursion, clear to auscultation bilaterally, no wheezes or crackles CV:         Regular rate and rhythm; no murmurs, rubs, or gallops.  No pedal edema Abd:      + bowel sounds; soft, non-tender; no distension MSK: no acute synovitis of DIP or PIP joints, no mechanics hands.  Skin:      Warm and dry; no rashes Neuro: normal speech, no focal facial asymmetry Psych: alert and oriented x3, normal mood and affect   Data Reviewed/Medical Decision Making:  Independent interpretation of tests: Imaging:  Review of patient's CT Chest images March 2025 revealed chronic lower lobe ground glass on the left sided with dense consolidative changes on the right, cavitation in the RML, and bronchiectasis. The patient's images have been independently reviewed by me.    PFTs:  Labs:  Lab Results  Component Value Date   NA 138 02/08/2023   K 4.7 02/08/2023   CO2 24 02/08/2023   GLUCOSE 91 02/08/2023   BUN 24 02/08/2023   CREATININE 1.31 (H) 02/08/2023   CALCIUM  9.8 02/08/2023   EGFR 58 (L) 02/08/2023   GFRNONAA 60 (L) 12/10/2021   Lab Results  Component Value Date   WBC 7.5 05/16/2023   HGB 13.7 05/16/2023   HCT 41.0 05/16/2023   MCV 86 05/16/2023   PLT 232 05/16/2023     Immunization status:  Immunization History  Administered Date(s) Administered    Pneumococcal Conjugate-13 10/04/2016   Pneumococcal Polysaccharide-23 10/05/2017   Tdap 07/08/2014   Zoster Recombinant(Shingrix ) 03/21/2017, 06/14/2017     I reviewed prior external note(s) from ED, PCP  I reviewed the result(s) of the labs and imaging as noted above.   I have ordered CT Chest    Assessment & Plan Chronic Aspiration Pneumonia Recurrent aspiration pneumonia with CT showing bilateral consolidative changes, bronchiectasis, and cavities. Ongoing aspiration suspected, possibly worsened by snorting pain pills. Risk factors include snorting pills, alcohol use, and poor dental health. - Prescribe antibiotics for four weeks. - Order repeat chest CT post-antibiotics in 6 weeks. - Advise cessation of snorting pain pills. - Consider barium swallow study to assess swallowing function. - Discuss potential bronchoscopy if  issues persist.  Alcohol Use Disorder Daily alcohol consumption with withdrawal symptoms. Increases risk of pneumonia and lung issues. - Advise reduction of alcohol consumption.  Poor Dental Health Increases risk of aspiration and lung infection. Addressing dental issues could reduce pneumonia risk. - Recommend dental consultation. - Suggest considering dentures for improved oral health.  Shortness of breath - nebulizer machine and albuterol  treatments as needed   Return to Care: Return in about 2 months (around 08/21/2023).  Verdon Gore, MD Pulmonary and Critical Care Medicine [facility] [phone]  CC: Tysinger, Alm RAMAN, PA-C

## 2023-06-22 ENCOUNTER — Other Ambulatory Visit (HOSPITAL_COMMUNITY): Payer: Self-pay

## 2023-06-23 ENCOUNTER — Other Ambulatory Visit: Payer: Self-pay | Admitting: Medical

## 2023-06-24 ENCOUNTER — Other Ambulatory Visit (HOSPITAL_COMMUNITY): Payer: Self-pay

## 2023-06-24 NOTE — Telephone Encounter (Signed)
 Pt only uses Flexeril 1 tablet at night to help sleep. He wants to know if you can send in Crane.

## 2023-06-28 ENCOUNTER — Telehealth: Payer: Self-pay | Admitting: Medical

## 2023-06-28 NOTE — Telephone Encounter (Signed)
 Pt is requesting a refill on Cyclobenzaprine to  [facility] #[id number] - [place], [place] - [address] [facility] [address]

## 2023-06-28 NOTE — Telephone Encounter (Signed)
 Last apt 05/16/23.

## 2023-07-04 NOTE — Telephone Encounter (Signed)
 Left message for pt to schedule appointment.

## 2023-07-04 NOTE — Telephone Encounter (Signed)
 Please schedule appt. Logan Phillips wants him back to discuss medications

## 2023-07-08 ENCOUNTER — Ambulatory Visit: Admitting: Medical

## 2023-07-15 ENCOUNTER — Ambulatory Visit: Admitting: Medical

## 2023-07-18 ENCOUNTER — Other Ambulatory Visit (HOSPITAL_COMMUNITY): Payer: Self-pay

## 2023-07-18 NOTE — Telephone Encounter (Signed)
 Appeal was denied by the insurance.

## 2023-07-19 ENCOUNTER — Ambulatory Visit (INDEPENDENT_AMBULATORY_CARE_PROVIDER_SITE_OTHER): Admitting: Medical

## 2023-07-19 ENCOUNTER — Encounter: Payer: Self-pay | Admitting: Medical

## 2023-07-19 VITALS — BP 120/64 | HR 73 | Ht 67.0 in | Wt 143.0 lb

## 2023-07-19 DIAGNOSIS — G8929 Other chronic pain: Secondary | ICD-10-CM

## 2023-07-19 DIAGNOSIS — R918 Other nonspecific abnormal finding of lung field: Secondary | ICD-10-CM | POA: Diagnosis not present

## 2023-07-19 DIAGNOSIS — Z789 Other specified health status: Secondary | ICD-10-CM

## 2023-07-19 DIAGNOSIS — T17908D Unspecified foreign body in respiratory tract, part unspecified causing other injury, subsequent encounter: Secondary | ICD-10-CM

## 2023-07-19 DIAGNOSIS — R41840 Attention and concentration deficit: Secondary | ICD-10-CM

## 2023-07-19 DIAGNOSIS — T17908A Unspecified foreign body in respiratory tract, part unspecified causing other injury, initial encounter: Secondary | ICD-10-CM | POA: Insufficient documentation

## 2023-07-19 DIAGNOSIS — J189 Pneumonia, unspecified organism: Secondary | ICD-10-CM

## 2023-07-19 DIAGNOSIS — J984 Other disorders of lung: Secondary | ICD-10-CM | POA: Insufficient documentation

## 2023-07-19 DIAGNOSIS — G4700 Insomnia, unspecified: Secondary | ICD-10-CM | POA: Diagnosis not present

## 2023-07-19 DIAGNOSIS — R7989 Other specified abnormal findings of blood chemistry: Secondary | ICD-10-CM

## 2023-07-19 DIAGNOSIS — F1990 Other psychoactive substance use, unspecified, uncomplicated: Secondary | ICD-10-CM

## 2023-07-19 DIAGNOSIS — E291 Testicular hypofunction: Secondary | ICD-10-CM

## 2023-07-19 MED ORDER — CLONIDINE HCL 0.1 MG PO TABS: 0.1000 mg | ORAL_TABLET | Freq: Every day | ORAL | 0 refills | Status: DC

## 2023-07-19 MED ORDER — BELSOMRA 15 MG PO TABS: 15.0000 mg | ORAL_TABLET | Freq: Every evening | ORAL | 0 refills | Status: DC | PRN

## 2023-07-19 MED ORDER — AMPHETAMINE-DEXTROAMPHETAMINE 10 MG PO TABS: 10.0000 mg | ORAL_TABLET | Freq: Every day | ORAL | 0 refills | Status: DC

## 2023-07-19 MED ORDER — TESTOSTERONE 25 MG/2.5GM (1%) TD GEL: 2.0000 | Freq: Every day | TRANSDERMAL | 2 refills | Status: DC

## 2023-07-19 NOTE — Progress Notes (Signed)
 Subjective:  Logan Phillips is a 73 y.o. male who presents for Chief Complaint  Patient presents with   med check    refills     Here for med check and refills.   Needs refill on adderall and flexeril   He is using flexeril  nightly for sleep.  Doesn't use it every night, but somewhat often  He sees ortho/pain management for chronic pain.  He saw pulmonology recently after CT scan of chest and chest ray showed abnormality.  There was concern for aspiration.  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  He continues on testosterone  but not every day.   Sometimes forgets.  In the past xanax worked great for sleep.   Sometimes uses benadryl  He does endorse drinking on averages 1 beer daily  No other aggravating or relieving factors.    No other c/o.  Past Medical History:  Diagnosis Date   Chronic pain in shoulder 2018   [facility]   H/O echocardiogram 10/08/2016   EF 65-70%, normal wall motion, systolic function vigorous   Hearing loss    pending hearing aids 06/2014   Hypertension    Lipoma    Varicose vein    Vestibular schwannoma (HCC) 04/2018  ' Current Outpatient Medications on File Prior to Visit  Medication Sig Dispense Refill   acetaminophen  (TYLENOL ) 500 MG tablet Take 1,000 mg by mouth every 6 (six) hours as needed.     allopurinol (ZYLOPRIM) 100 MG tablet TAKE 1 TABLET BY MOUTH EVERY DAY 90 tablet 1   amoxicillin -clavulanate (AUGMENTIN ) 875-125 MG tablet Take 1 tablet by mouth 2 (two) times daily. 60 tablet 0   lisinopril  (ZESTRIL ) 10 MG tablet TAKE 1 TABLET BY MOUTH IN THE MORNING AND 1/2 TABLET IN THE AFTERNOON 135 tablet 1   meloxicam  (MOBIC ) 15 MG tablet TAKE 1 TABLET(15 MG) BY MOUTH DAILY 90 tablet 0   Multiple Vitamin (MULITIVITAMIN WITH MINERALS) TABS Take 1 tablet by mouth daily.     Oxycodone   HCl 10 MG TABS Take 10 mg by mouth 5 (five) times daily.     rosuvastatin  (CRESTOR ) 10 MG tablet Take 1 tablet (10 mg total) by mouth daily. 90 tablet 3   No current facility-administered medications on file prior to visit.  '  The following portions of the patient's history were reviewed and updated as appropriate: allergies, current medications, past family history, past medical history, past social history, past surgical history and problem list.  ROS Otherwise as in subjective above    Objective: BP 120/64   Pulse 73   Ht 5\' 7"  (1.702 m)   Wt 143 lb (64.9 kg)   SpO2 98%   BMI 22.40 kg/m   Wt Readings from Last 3 Encounters:  07/19/23 143 lb (64.9 kg)  06/21/23 145 lb (65.8 kg)  05/16/23 145 lb 9.6 oz (66 kg)    General appearance: alert, no distress, well developed, well nourished Neck: supple, no lymphadenopathy, no thyromegaly, no masses Heart: RRR, normal S1, S2, no murmurs Lungs: decreased right lower fields, otherwise clear, no wheezes, rhonchi, or rales Abdomen: +bs, soft, non tender, non distended, no masses, no hepatomegaly, no splenomegaly Pulses: 2+ radial pulses, 2+ pedal pulses, normal cap refill Ext: no edema   Assessment: Encounter Diagnoses  Name Primary?   Insomnia, unspecified type Yes   Attention  deficit    Regular alcohol consumption    Abnormal lung field    Low testosterone     Hypogonadism in male    Aspiration into airway, subsequent encounter    Inhales drugs    Cavitary pneumonia      Plan: Insomnia - discussed sleep hygiene, aggravating factors including stimulant medicaiton, alcohol use.   I strongly recommend cutting way down and eventually discontinuing alcohol.    Stop muscle relaxer as a sleep aid.  Begin trial of Belsomra.  If not covered by insurance, then switch to Clonidine QHS.  Call/recheck 63mo  Attention deficient - discussed risks/benefits of medicaiton.   He feels that adderall really helps his work and focus.  Continue  same medication for now  Regular alcohol use- advised gradual cessation in the next few weeks and ultimately quit. I recommended he consider AA meeting, counseling.    Low testosterone  - continue current therapy  Cavitary pneumonia, inhales drugs and aspiration - I reviewed recent abnormal CT chest, abnormal xray, and pulmonology notes.  He is currently on several weeks of Augmentin  therapy and he will follow up soon with pulmonology.  Inhaled drugs - advised he not snort any medication as he is currently being treated for possible aspiration/cavitary pneumonia.  Chronic pain - seeing pain/ortho currently  Overall is on some high risk medications.  We discussed medications, alcohol, safety of medicaiton, risk of medications    Haidar was seen today for med check.  Diagnoses and all orders for this visit:  Insomnia, unspecified type  Attention deficit  Regular alcohol consumption  Abnormal lung field  Low testosterone   Hypogonadism in male  Aspiration into airway, subsequent encounter  Inhales drugs  Cavitary pneumonia  Other orders -     Suvorexant (BELSOMRA) 15 MG TABS; Take 1 tablet (15 mg total) by mouth at bedtime as needed. -     cloNIDine (CATAPRES) 0.1 MG tablet; Take 1 tablet (0.1 mg total) by mouth at bedtime. -     Testosterone  25 MG/2.5GM (1%) GEL; Apply 2 Pump topically daily. -     amphetamine -dextroamphetamine  (ADDERALL) 10 MG tablet; Take 1 tablet (10 mg total) by mouth daily with breakfast.    Follow up: with pulmonology, 1-48mo

## 2023-07-20 ENCOUNTER — Telehealth: Payer: Self-pay | Admitting: Internal Medicine

## 2023-07-20 ENCOUNTER — Other Ambulatory Visit: Payer: Self-pay | Admitting: Medical

## 2023-07-20 NOTE — Telephone Encounter (Signed)
 Left detailed message for pt about medications being at pharmacy

## 2023-07-20 NOTE — Telephone Encounter (Signed)
 Copied from CRM [id number]. Topic: Clinical - Prescription Issue >> Jul 19, 2023  4:49 PM Hamp Levine R wrote: Reason for CRM: Patient states had a visit with the Dr today and he was supposed to refill his amphetamine -dextroamphetamine  (ADDERALL) 10 MG tablet and prescribe a sleep medication. Patient is following up, went to pharmacy who said to follow up with the Dr.  Patient can be reached at [phone].

## 2023-07-27 ENCOUNTER — Other Ambulatory Visit: Payer: Self-pay | Admitting: Medical

## 2023-07-27 ENCOUNTER — Ambulatory Visit (INDEPENDENT_AMBULATORY_CARE_PROVIDER_SITE_OTHER): Admitting: Medical

## 2023-07-27 VITALS — BP 110/74 | HR 88 | Wt 141.8 lb

## 2023-07-27 DIAGNOSIS — R0989 Other specified symptoms and signs involving the circulatory and respiratory systems: Secondary | ICD-10-CM | POA: Diagnosis not present

## 2023-07-27 DIAGNOSIS — G4700 Insomnia, unspecified: Secondary | ICD-10-CM | POA: Diagnosis not present

## 2023-07-27 DIAGNOSIS — Z789 Other specified health status: Secondary | ICD-10-CM

## 2023-07-27 DIAGNOSIS — R41840 Attention and concentration deficit: Secondary | ICD-10-CM

## 2023-07-27 DIAGNOSIS — R918 Other nonspecific abnormal finding of lung field: Secondary | ICD-10-CM

## 2023-07-27 DIAGNOSIS — I10 Essential (primary) hypertension: Secondary | ICD-10-CM

## 2023-07-27 MED ORDER — LISINOPRIL 2.5 MG PO TABS: 2.5000 mg | ORAL_TABLET | Freq: Two times a day (BID) | ORAL | 1 refills | Status: DC

## 2023-07-27 NOTE — Progress Notes (Signed)
 Subjective: Chief Complaint  Patient presents with   Follow-up    Follow-up from the last visit last from 07/19/23. Things are going ok   Here for follow-up.  I saw him just a few weeks ago regarding insomnia, blood pressure, ADD follow-up, and abnormal lung field follow-up.  Insomnia-he did try the Belsomra  and that seems to be helping.  He initially tried 15 mg tablet but that was a little bit too much so he is using 1/2 tablet now.  He did not begin the clonidine  prescription that was sent as an alternate.  At his last visit we discussed not using alprazolam or other more habit-forming medications to help with sleep  He is concerned about his blood pressure.  He checks his blood pressure every morning.  If the numbers below 120/70 he does not take his blood pressure pill.  If it is a little bit above that he will take half tablet of the lisinopril  10 mg daily, 5mg .  If his pressure is higher such as 160 systolic he will double up and take 2 of his lisinopril  10 mg or 20 mg.  He was playing golf through the day and his vision seemed to be off.  He felt like his blood pressure was low.  Some days his pressures lower than normal.  Some days it is as high as 150 or 160 systolic. No recent chest pain, edema, shortness of breath or palpitation.  Doing fine on current dose of Adderall  Regarding prior cardiac screening he notes having a more thorough vascular disease screening test several years ago, possibly 3 to 4 years ago including carotid ultrasound.  This was done at [facility] medical.  Past Medical History:  Diagnosis Date   Chronic pain in shoulder 2018   [facility]   H/O echocardiogram 10/08/2016   EF 65-70%, normal wall motion, systolic function vigorous   Hearing loss    pending hearing aids 06/2014   Hypertension    Lipoma    Varicose vein    Vestibular schwannoma (HCC) 04/2018   Current Outpatient Medications on File Prior to Visit  Medication Sig Dispense Refill    allopurinol (ZYLOPRIM) 100 MG tablet TAKE 1 TABLET BY MOUTH EVERY DAY 90 tablet 1   amphetamine -dextroamphetamine  (ADDERALL) 10 MG tablet Take 1 tablet (10 mg total) by mouth daily with breakfast. 30 tablet 0   lisinopril  (ZESTRIL ) 10 MG tablet TAKE 1 TABLET BY MOUTH IN THE MORNING AND 1/2 TABLET IN THE AFTERNOON 135 tablet 1   meloxicam  (MOBIC ) 15 MG tablet TAKE 1 TABLET(15 MG) BY MOUTH DAILY 90 tablet 0   Multiple Vitamin (MULITIVITAMIN WITH MINERALS) TABS Take 1 tablet by mouth daily.     Oxycodone  HCl 10 MG TABS Take 10 mg by mouth 5 (five) times daily.     rosuvastatin  (CRESTOR ) 10 MG tablet Take 1 tablet (10 mg total) by mouth daily. 90 tablet 3   Suvorexant  (BELSOMRA ) 15 MG TABS Take 1 tablet (15 mg total) by mouth at bedtime as needed. 30 tablet 0   Testosterone  25 MG/2.5GM (1%) GEL Apply 2 Pump topically daily. 75 g 2   acetaminophen  (TYLENOL ) 500 MG tablet Take 1,000 mg by mouth every 6 (six) hours as needed.     cloNIDine  (CATAPRES ) 0.1 MG tablet Take 1 tablet (0.1 mg total) by mouth at bedtime. (Patient not taking: Reported on 07/27/2023) 30 tablet 0   No current facility-administered medications on file prior to visit.   ROS as in  subjective    Objective: BP 110/74   Pulse 88   Wt 141 lb 12.8 oz (64.3 kg)   BMI 22.21 kg/m    Gen: wd, wn, nad Heart rrr, normal s1, s2, no mumrus No LE edema Pulses 2+    Assessment: Encounter Diagnoses  Name Primary?   Labile hypertension Yes   Attention deficit    Insomnia, unspecified type    Regular alcohol consumption    Essential hypertension    Abnormal lung field     Plan: Labile hypertension-he did not do well with metoprolol  prior last year, orthostatic and lightheaded.  We will discontinue lisinopril  10 mg and change to lower dose lisinopril  2.5 mg twice daily instead of once daily.  Monitor blood pressure daily and write them down.  I gave him a chart to record his blood pressures.  Limit salt.  We discussed his  alcohol use.  Advised that alcohol will affect his blood pressure.  Advised to limit alcohol in general.  Insomnia-seems to be doing okay on recent trial of Belsomra  15 mg  Cavitary pneumonia, inhales drugs and aspiration - I reviewed recent abnormal CT chest, abnormal xray, and pulmonology notes.  He just recently finished several weeks of Augmentin  therapy and he will follow up soon with pulmonology.     Talvin was seen today for follow-up.  Diagnoses and all orders for this visit:  Labile hypertension  Attention deficit  Insomnia, unspecified type  Regular alcohol consumption  Essential hypertension  Abnormal lung field  Other orders -     lisinopril  (ZESTRIL ) 2.5 MG tablet; Take 1 tablet (2.5 mg total) by mouth in the morning and at bedtime.     F/u 45mo

## 2023-07-31 ENCOUNTER — Other Ambulatory Visit: Payer: Self-pay | Admitting: Medical

## 2023-08-01 ENCOUNTER — Other Ambulatory Visit

## 2023-08-01 NOTE — Telephone Encounter (Signed)
 Last apt 07/27/23.

## 2023-08-03 ENCOUNTER — Other Ambulatory Visit: Payer: Self-pay | Admitting: Medical

## 2023-08-04 ENCOUNTER — Encounter: Payer: Self-pay | Admitting: Internal Medicine

## 2023-08-10 ENCOUNTER — Ambulatory Visit
Admission: RE | Admit: 2023-08-10 | Discharge: 2023-08-10 | Disposition: A | Discharge status: Home / Self Care | Source: Ambulatory Visit | Attending: Internal Medicine

## 2023-08-10 DIAGNOSIS — J189 Pneumonia, unspecified organism: Secondary | ICD-10-CM

## 2023-08-19 ENCOUNTER — Telehealth: Payer: Self-pay

## 2023-08-19 NOTE — Telephone Encounter (Signed)
 Copied from CRM [id number]. Topic: Clinical - Lab/Test Results >> Aug 19, 2023  8:32 AM Juliana Ocean wrote: Reason for CRM: Tiffany w/ [facility] radiology calling to make sure you got the results on the pt's CT.  She wants to make sure the dr sees these results.

## 2023-08-22 ENCOUNTER — Ambulatory Visit: Payer: Self-pay | Admitting: Internal Medicine

## 2023-08-22 NOTE — Telephone Encounter (Signed)
 Received. Will address with patient.

## 2023-08-23 ENCOUNTER — Telehealth: Payer: Self-pay | Admitting: Medical

## 2023-08-23 ENCOUNTER — Other Ambulatory Visit: Payer: Self-pay | Admitting: Medical

## 2023-08-23 MED ORDER — AMPHETAMINE-DEXTROAMPHETAMINE 10 MG PO TABS: 10.0000 mg | ORAL_TABLET | Freq: Every day | ORAL | 0 refills | Status: DC

## 2023-08-23 NOTE — Telephone Encounter (Signed)
 Copied from CRM [id number]. Topic: Clinical - Medication Refill >> Aug 23, 2023  3:57 PM Donald Frost wrote: Medication: amphetamine -dextroamphetamine  (ADDERALL) 10 MG tablet  Has the patient contacted their pharmacy? No   This is the patient's preferred pharmacy:  [facility] #[id number] [place] Nest[place],  - [address] AT [facility] [address] [facility] [address] [place] [address] Phone: [phone] Fax: [phone]    Is this the correct pharmacy for this prescription? Yes If no, delete pharmacy and type the correct one.   Has the prescription been filled recently? No  Is the patient out of the medication? Yes he contacted the pharmacy but didn't realize he needed to call his provider  Has the patient been seen for an appointment in the last year OR does the patient have an upcoming appointment? Yes  Can we respond through MyChart? Yes  Please assist patient further

## 2023-08-24 ENCOUNTER — Ambulatory Visit: Admitting: Internal Medicine

## 2023-08-24 ENCOUNTER — Telehealth: Payer: Self-pay | Admitting: *Deleted

## 2023-08-24 NOTE — Telephone Encounter (Signed)
 ATC patient x1.  Left detailed message per DPR.  Advised to call back.  Dr. Dione Franks wants him to reschedule to discuss test results.  She said she can see him tomorrow, 08/25/23 at either 8:30 am or 11 am, ok to double book at those 2 times only.  Will await return call.

## 2023-08-31 NOTE — Telephone Encounter (Signed)
 Patient scheduled for 09/01/2023.

## 2023-09-01 ENCOUNTER — Encounter: Payer: Self-pay | Admitting: Internal Medicine

## 2023-09-01 ENCOUNTER — Ambulatory Visit: Admitting: Internal Medicine

## 2023-09-01 VITALS — BP 121/75 | HR 79 | Ht 67.0 in | Wt 145.0 lb

## 2023-09-01 DIAGNOSIS — J984 Other disorders of lung: Secondary | ICD-10-CM

## 2023-09-01 DIAGNOSIS — J189 Pneumonia, unspecified organism: Secondary | ICD-10-CM

## 2023-09-01 NOTE — Progress Notes (Addendum)
 Logan Phillips    [id number]    17-Jun-1950  Primary Care Physician:Tysinger, Alm RAMAN, PA-C Date of Appointment: 09/01/2023 Established Patient Visit  Chief complaint:   Chief Complaint  Patient presents with   Follow-up    Ct f/u      HPI: Logan Phillips is a 73 y.o. man with alcohol use disorder, opioid use disorder including snorting pills,  who presented with cavitary pneumonia in April 2025. Findings have been present since 2023 on his CT Chest.    Interval Updates: Here after follow up CT Chest. He had empiric antibiotics with augmentin  for one month. Completed the course.   Repeat CT Chest shows ongoing cavitation with reports concerning for malignancy.   No dyspnea. No cough, hemoptysis. No respiratory symptoms at all. No fevers chills night sweats or weight loss.   No urinary symptoms or sinus disease.   I have reviewed the patient's family social and past medical history and updated as appropriate.   Past Medical History:  Diagnosis Date   Chronic pain in shoulder 2018   [facility]   H/O echocardiogram 10/08/2016   EF 65-70%, normal wall motion, systolic function vigorous   Hearing loss    pending hearing aids 06/2014   Hypertension    Lipoma    Varicose vein    Vestibular schwannoma (HCC) 04/2018    Past Surgical History:  Procedure Laterality Date   APPENDECTOMY     APPLICATION OF CRANIAL NAVIGATION Right 05/09/2018   Procedure: APPLICATION OF CRANIAL NAVIGATION;  Surgeon: Lanis Pupa, MD;  Location: [facility] OR;  Service: Neurosurgery;  Laterality: Right;   BIOPSY  04/12/2018   Procedure: BIOPSY;  Surgeon: Kristie Lamprey, MD;  Location: [facility] ENDOSCOPY;  Service: Endoscopy;;   COLONOSCOPY     age 7, he did not f/u with referral 2018   COLONOSCOPY WITH PROPOFOL  N/A 04/12/2018   Procedure: COLONOSCOPY WITH PROPOFOL ;  Surgeon: Kristie Lamprey, MD;  Location: [facility] ENDOSCOPY;  Service: Endoscopy;  Laterality: N/A;   CRANIOTOMY Right  05/09/2018   Procedure: Right retrosigmoid craniectomy, resection of acoustic neuroma with intraoperative facial monitoring/Brain Lab;  Surgeon: Lanis Pupa, MD;  Location: [facility] OR;  Service: Neurosurgery;  Laterality: Right;   LUMBAR DISC SURGERY  2002   Dr. Mora   WISDOM TOOTH EXTRACTION      Family History  Problem Relation Age of Onset   Hypertension Mother    Dementia Father    Parkinsonism Father    Diabetes Father        early stages   Cancer Maternal Grandmother        breast   Heart disease Neg Hx    Stroke Neg Hx     Social History   Occupational History   Not on file  Tobacco Use   Smoking status: Never    Passive exposure: Past   Smokeless tobacco: Never  Vaping Use   Vaping status: Never Used  Substance and Sexual Activity   Alcohol use: Not Currently    Alcohol/week: 20.0 standard drinks of alcohol    Types: 20 Shots of liquor per week    Comment: stopped 3 weeks ago 04/12/2018   Drug use: No   Sexual activity: Not on file     Physical Exam: Blood pressure 121/75, pulse 79, height 5' 7 (1.702 m), weight 145 lb (65.8 kg), SpO2 96%.  Gen:      No acute distress ENT:  no nasal polyps,  mucus membranes moist Lungs:    No increased respiratory effort, symmetric chest wall excursion, clear to auscultation bilaterally, no wheezes or crackles CV:         Regular rate and rhythm; no murmurs, rubs, or gallops.  No pedal edema   Data Reviewed: Imaging: I have personally reviewed the CT Chest from May 2025 which shows ongoing bilateral lower lobe consolidations with evolving cavitary lesion in the left lung.   PFTs:  Labs: Lab Results  Component Value Date   NA 138 02/08/2023   K 4.7 02/08/2023   CO2 24 02/08/2023   GLUCOSE 91 02/08/2023   BUN 24 02/08/2023   CREATININE 1.31 (H) 02/08/2023   CALCIUM  9.8 02/08/2023   EGFR 58 (L) 02/08/2023   GFRNONAA 60 (L) 12/10/2021   Lab Results  Component Value Date   WBC 7.5 05/16/2023   HGB 13.7  05/16/2023   HCT 41.0 05/16/2023   MCV 86 05/16/2023   PLT 232 05/16/2023    Immunization status: Immunization History  Administered Date(s) Administered   Pneumococcal Conjugate-13 10/04/2016   Pneumococcal Polysaccharide-23 10/05/2017   Tdap 07/08/2014   Zoster Recombinant(Shingrix ) 03/21/2017, 06/14/2017    External Records Personally Reviewed: pulmonary  Assessment:  Non resolving cavitary pneumonia Alcohol Use disorder Shortness of breath  Plan/Recommendations:  Symptoms did not improve with prolonged antibiotics. Differential includes granulomatosis with polyangitis, malignancy.  He also continues to snort pain pills. Denies IV drugs. CT findings have been present since 2023 which makes malignancy seem less likely.   Because your pneumonia is not resolving, I am recommending a bronchoscopy to investigate further.   We will try to arrange this for next week.   Before the procedure no eating at midnight the night before. Ok to take your medications that morning with a small sip of water.  Will plan for bronchoscopy with BAL, transbronchial biopsy and endobronchial brushings  Patient requires albuterol  nebulizer and nebulizer machine for ongoing shortness of breath.   Return to Care: Return in about 3 months (around 12/02/2023).   Verdon Gore, MD Pulmonary and Critical Care Medicine [facility] Office:[phone]

## 2023-09-01 NOTE — Patient Instructions (Addendum)
 It was a pleasure to see you today!  Please schedule follow up with myself in 3 months.  If my schedule is not open yet, we will contact you with a reminder closer to that time. Please call [phone] if you haven't heard from us  a month before, and always call us  sooner if issues or concerns arise. You can also send us  a message through MyChart, but but aware that this is not to be used for urgent issues and it may take up to 5-7 days to receive a reply. Please be aware that you will likely be able to view your results before I have a chance to respond to them. Please give us  5 business days to respond to any non-urgent results.    Because your pneumonia is not resolving, I am recommending a bronchoscopy to investigate further.   We will try to arrange this for next week.   Before the procedure no eating at midnight the night before. Ok to take your medications that morning with a small sip of water.

## 2023-09-01 NOTE — H&P (View-Only) (Signed)
 Logan Phillips    [id number]    02/25/1951  Primary Care Physician:Tysinger, Christiane Cowing, PA-C Date of Appointment: 09/01/2023 Established Patient Visit  Chief complaint:   Chief Complaint  Patient presents with   Follow-up    Ct f/u      HPI: Logan Phillips is a 73 y.o. man with alcohol use disorder, opioid use disorder including snorting pills,  who presented with cavitary pneumonia in April 2025. Findings have been present since 2023 on his CT Chest.    Interval Updates: Here after follow up CT Chest. He had empiric antibiotics with augmentin  for one month. Completed the course.   Repeat CT Chest shows ongoing cavitation with reports concerning for malignancy.   No dyspnea. No cough, hemoptysis. No respiratory symptoms at all. No fevers chills night sweats or weight loss.   No urinary symptoms or sinus disease.   I have reviewed the patient's family social and past medical history and updated as appropriate.   Past Medical History:  Diagnosis Date   Chronic pain in shoulder 2018   [facility]   H/O echocardiogram 10/08/2016   EF 65-70%, normal wall motion, systolic function vigorous   Hearing loss    pending hearing aids 06/2014   Hypertension    Lipoma    Varicose vein    Vestibular schwannoma (HCC) 04/2018    Past Surgical History:  Procedure Laterality Date   APPENDECTOMY     APPLICATION OF CRANIAL NAVIGATION Right 05/09/2018   Procedure: APPLICATION OF CRANIAL NAVIGATION;  Surgeon: Augusto Blonder, MD;  Location: [facility] OR;  Service: Neurosurgery;  Laterality: Right;   BIOPSY  04/12/2018   Procedure: BIOPSY;  Surgeon: Tami Falcon, MD;  Location: [facility] ENDOSCOPY;  Service: Endoscopy;;   COLONOSCOPY     age 52, he did not f/u with referral 2018   COLONOSCOPY WITH PROPOFOL  N/A 04/12/2018   Procedure: COLONOSCOPY WITH PROPOFOL ;  Surgeon: Tami Falcon, MD;  Location: [facility] ENDOSCOPY;  Service: Endoscopy;  Laterality: N/A;   CRANIOTOMY Right  05/09/2018   Procedure: Right retrosigmoid craniectomy, resection of acoustic neuroma with intraoperative facial monitoring/Brain Lab;  Surgeon: Augusto Blonder, MD;  Location: [facility] OR;  Service: Neurosurgery;  Laterality: Right;   LUMBAR DISC SURGERY  2002   Dr. Lawson Prey   WISDOM TOOTH EXTRACTION      Family History  Problem Relation Age of Onset   Hypertension Mother    Dementia Father    Parkinsonism Father    Diabetes Father        early stages   Cancer Maternal Grandmother        breast   Heart disease Neg Hx    Stroke Neg Hx     Social History   Occupational History   Not on file  Tobacco Use   Smoking status: Never    Passive exposure: Past   Smokeless tobacco: Never  Vaping Use   Vaping status: Never Used  Substance and Sexual Activity   Alcohol use: Not Currently    Alcohol/week: 20.0 standard drinks of alcohol    Types: 20 Shots of liquor per week    Comment: stopped 3 weeks ago 04/12/2018   Drug use: No   Sexual activity: Not on file     Physical Exam: Blood pressure 121/75, pulse 79, height 5' 7 (1.702 m), weight 145 lb (65.8 kg), SpO2 96%.  Gen:      No acute distress ENT:  no nasal polyps,  mucus membranes moist Lungs:    No increased respiratory effort, symmetric chest wall excursion, clear to auscultation bilaterally, no wheezes or crackles CV:         Regular rate and rhythm; no murmurs, rubs, or gallops.  No pedal edema   Data Reviewed: Imaging: I have personally reviewed the CT Chest from May 2025 which shows ongoing bilateral lower lobe consolidations with evolving cavitary lesion in the left lung.   PFTs:  Labs: Lab Results  Component Value Date   NA 138 02/08/2023   K 4.7 02/08/2023   CO2 24 02/08/2023   GLUCOSE 91 02/08/2023   BUN 24 02/08/2023   CREATININE 1.31 (H) 02/08/2023   CALCIUM  9.8 02/08/2023   EGFR 58 (L) 02/08/2023   GFRNONAA 60 (L) 12/10/2021   Lab Results  Component Value Date   WBC 7.5 05/16/2023   HGB 13.7  05/16/2023   HCT 41.0 05/16/2023   MCV 86 05/16/2023   PLT 232 05/16/2023    Immunization status: Immunization History  Administered Date(s) Administered   Pneumococcal Conjugate-13 10/04/2016   Pneumococcal Polysaccharide-23 10/05/2017   Tdap 07/08/2014   Zoster Recombinant(Shingrix ) 03/21/2017, 06/14/2017    External Records Personally Reviewed: pulmonary  Assessment:  Non resolving cavitary pneumonia Alcohol Use disorder  Plan/Recommendations:  Symptoms did not improve with prolonged antibiotics. Differential includes granulomatosis with polyangitis, malignancy.  He also continues to snort pain pills. Denies IV drugs. CT findings have been present since 2023 which makes malignancy seem less likely.   Because your pneumonia is not resolving, I am recommending a bronchoscopy to investigate further.   We will try to arrange this for next week.   Before the procedure no eating at midnight the night before. Ok to take your medications that morning with a small sip of water.  Will plan for bronchoscopy with BAL, transbronchial biopsy and endobronchial brushings  Return to Care: Return in about 3 months (around 12/02/2023).   Logan Rover, MD Pulmonary and Critical Care Medicine [facility] Office:[phone]

## 2023-09-05 NOTE — Anesthesia Preprocedure Evaluation (Addendum)
 Anesthesia Evaluation  Patient identified by MRN, date of birth, ID band Patient awake    Reviewed: Allergy & Precautions, NPO status , Patient's Chart, lab work & pertinent test results  History of Anesthesia Complications Negative for: history of anesthetic complications  Airway Mallampati: II  TM Distance: >3 FB Neck ROM: Full    Dental no notable dental hx. (+) Poor Dentition, Missing, Dental Advisory Given   Pulmonary    Pulmonary exam normal breath sounds clear to auscultation       Cardiovascular hypertension, Pt. on medications Normal cardiovascular exam Rhythm:Regular Rate:Normal     Neuro/Psych    GI/Hepatic ,GERD  Medicated and Controlled,,  Endo/Other    Renal/GU      Musculoskeletal   Abdominal   Peds  Hematology   Anesthesia Other Findings   Reproductive/Obstetrics                             Anesthesia Physical Anesthesia Plan  ASA: 2  Anesthesia Plan: General   Post-op Pain Management: Minimal or no pain anticipated   Induction: Intravenous  PONV Risk Score and Plan: 3 and Treatment may vary due to age or medical condition, Ondansetron  and Dexamethasone   Airway Management Planned: Oral ETT  Additional Equipment: None  Intra-op Plan:   Post-operative Plan: Extubation in OR  Informed Consent: I have reviewed the patients History and Physical, chart, labs and discussed the procedure including the risks, benefits and alternatives for the proposed anesthesia with the patient or authorized representative who has indicated his/her understanding and acceptance.     Dental advisory given  Plan Discussed with: CRNA and Anesthesiologist  Anesthesia Plan Comments: (Cavitary pneumonia non resolving)       Anesthesia Quick Evaluation

## 2023-09-06 ENCOUNTER — Ambulatory Visit (HOSPITAL_COMMUNITY)

## 2023-09-06 ENCOUNTER — Ambulatory Visit (HOSPITAL_BASED_OUTPATIENT_CLINIC_OR_DEPARTMENT_OTHER): Admitting: Anesthesiology

## 2023-09-06 ENCOUNTER — Encounter (HOSPITAL_COMMUNITY)
Admission: RE | Disposition: A | Discharge status: Home / Self Care | Payer: Self-pay | Source: Home / Self Care | Attending: Internal Medicine

## 2023-09-06 ENCOUNTER — Encounter (HOSPITAL_COMMUNITY): Payer: Self-pay | Admitting: Internal Medicine

## 2023-09-06 ENCOUNTER — Ambulatory Visit (HOSPITAL_COMMUNITY)
Admission: RE | Admit: 2023-09-06 | Discharge: 2023-09-06 | Disposition: A | Discharge status: Home / Self Care | Attending: Internal Medicine | Admitting: Internal Medicine

## 2023-09-06 ENCOUNTER — Other Ambulatory Visit: Payer: Self-pay

## 2023-09-06 ENCOUNTER — Ambulatory Visit (HOSPITAL_COMMUNITY): Admitting: Anesthesiology

## 2023-09-06 DIAGNOSIS — J189 Pneumonia, unspecified organism: Secondary | ICD-10-CM

## 2023-09-06 DIAGNOSIS — J984 Other disorders of lung: Secondary | ICD-10-CM | POA: Diagnosis present

## 2023-09-06 DIAGNOSIS — J691 Pneumonitis due to inhalation of oils and essences: Secondary | ICD-10-CM | POA: Diagnosis not present

## 2023-09-06 DIAGNOSIS — R918 Other nonspecific abnormal finding of lung field: Secondary | ICD-10-CM

## 2023-09-06 HISTORY — PX: VIDEO BRONCHOSCOPY: SHX5072

## 2023-09-06 HISTORY — DX: Pneumonia, unspecified organism: J18.9

## 2023-09-06 SURGERY — BRONCHOSCOPY, WITH FLUOROSCOPY
Anesthesia: General

## 2023-09-06 MED ORDER — CHLORHEXIDINE GLUCONATE 0.12 % MT SOLN
15.0000 mL | Freq: Once | OROMUCOSAL | Status: AC
  Administered 2023-09-06: 15 mL via OROMUCOSAL

## 2023-09-06 MED ORDER — CHLORHEXIDINE GLUCONATE 0.12 % MT SOLN
OROMUCOSAL | Status: AC
Start: 2023-09-06 — End: 2023-09-06
  Filled 2023-09-06: qty 15

## 2023-09-06 MED ORDER — ROCURONIUM BROMIDE 10 MG/ML (PF) SYRINGE
PREFILLED_SYRINGE | INTRAVENOUS | Status: DC | PRN
  Administered 2023-09-06: 10 mg via INTRAVENOUS

## 2023-09-06 MED ORDER — PROPOFOL 10 MG/ML IV BOLUS
INTRAVENOUS | Status: DC | PRN
  Administered 2023-09-06: 120 mg via INTRAVENOUS
  Administered 2023-09-06: 50 mg via INTRAVENOUS

## 2023-09-06 MED ORDER — ONDANSETRON HCL 4 MG/2ML IJ SOLN
INTRAMUSCULAR | Status: DC | PRN
  Administered 2023-09-06: 4 mg via INTRAVENOUS

## 2023-09-06 MED ORDER — SUCCINYLCHOLINE CHLORIDE 200 MG/10ML IV SOSY
PREFILLED_SYRINGE | INTRAVENOUS | Status: DC | PRN
  Administered 2023-09-06: 80 mg via INTRAVENOUS

## 2023-09-06 MED ORDER — FENTANYL CITRATE (PF) 100 MCG/2ML IJ SOLN
INTRAMUSCULAR | Status: AC
  Filled 2023-09-06: qty 2

## 2023-09-06 MED ORDER — LACTATED RINGERS IV SOLN
INTRAVENOUS | Status: AC | PRN
  Administered 2023-09-06: 20 mL/h via INTRAVENOUS

## 2023-09-06 MED ORDER — SUGAMMADEX SODIUM 200 MG/2ML IV SOLN
INTRAVENOUS | Status: DC | PRN
Start: 2023-09-06 — End: 2023-09-06
  Administered 2023-09-06: 200 mg via INTRAVENOUS

## 2023-09-06 MED ORDER — DEXAMETHASONE SODIUM PHOSPHATE 10 MG/ML IJ SOLN
INTRAMUSCULAR | Status: DC | PRN
  Administered 2023-09-06: 10 mg via INTRAVENOUS

## 2023-09-06 MED ORDER — MIDAZOLAM HCL 5 MG/5ML IJ SOLN
INTRAMUSCULAR | Status: DC | PRN
  Administered 2023-09-06 (×2): 1 mg via INTRAVENOUS

## 2023-09-06 MED ORDER — LIDOCAINE HCL (CARDIAC) PF 100 MG/5ML IV SOSY
PREFILLED_SYRINGE | INTRAVENOUS | Status: DC | PRN
  Administered 2023-09-06: 100 mg via INTRAVENOUS

## 2023-09-06 MED ORDER — FENTANYL CITRATE (PF) 100 MCG/2ML IJ SOLN
INTRAMUSCULAR | Status: DC | PRN
  Administered 2023-09-06 (×2): 50 ug via INTRAVENOUS

## 2023-09-06 MED ORDER — MIDAZOLAM HCL 2 MG/2ML IJ SOLN
INTRAMUSCULAR | Status: AC
  Filled 2023-09-06: qty 2

## 2023-09-06 NOTE — Op Note (Signed)
 [facility] Cardiopulmonary Patient Name: Logan Phillips Procedure Date: 09/06/2023 MRN: [id number] Attending MD: Verdon RAMAN. Meade MD, MD, [id number] Date of Birth: Apr 04, 1950 CSN: [id number] Age: 73 Admit Type: Inpatient Ethnicity: Not Hispanic or Latino Procedure:             Bronchoscopy Indications:           Bilateral infiltrate, Unresolving bilateral infiltrates Providers:             Verdon RAMAN. Meade MD, MD, Willy Hummer, RN, Coye Bade, Technician Referring MD:           Medicines:              Complications:         No immediate complications. Estimated blood loss:                         Minimal Estimated Blood Loss:  Estimated blood loss was minimal. Procedure:      Pre-Anesthesia Assessment:      - A History and Physical has been performed. The patient's medications,       allergies and sensitivities have been reviewed.      After obtaining informed consent, the bronchoscope was passed under       direct vision. Throughout the procedure, the patient's blood pressure,       pulse, and oxygen saturations were monitored continuously. the BF-[id number]       ([id number]) Olympus bronchoscope was introduced through the nose, via the       endotracheal tube (the patient was intubated for the procedure) and       advanced to the tracheobronchial tree of both lungs. The patient       tolerated the procedure well. Findings:      The endotracheal tube is in good position. The visualized portion of the       trachea is of normal caliber. The carina is sharp. The tracheobronchial       tree was examined to at least the first subsegmental level. Bronchial       mucosa and anatomy are normal; there are no endobronchial lesions, and       no secretions. Multiple passes of transbronchial biopsies were taken       from various segments of the LLL. There was mild bleeding which resolved       with cold saline instillation. Next a BAL was taken from  the RML. There       was diffuse erythema in the RML after suction return. the remainder of       the right tracheobronchial tree was normal. Impression:      - Unresolving bilateral infiltrates      - The airway examination was normal.      - LLL Transbronchial biopsies sent for cytology, tissue culture, fungal       and afb      - RML BAL sent for cytology, cell count, gram stain and culture Moderate Sedation:      see anesthesia note Recommendation:      - Await test results. Procedure Code(s):      --- Professional ---      [phone], Bronchoscopy, rigid or flexible, including fluoroscopic guidance,       when performed; diagnostic, with cell washing, when performed (separate  procedure) Diagnosis Code(s):      --- Professional ---      R91.8, Other nonspecific abnormal finding of lung field CPT copyright 2022 American Medical Association. All rights reserved. The codes documented in this report are preliminary and upon coder review may  be revised to meet current compliance requirements. [name] MD, MD 09/06/2023 12:58:58 PM This report has been signed electronically. Number of Addenda: 0 Scope In: Scope Out:

## 2023-09-06 NOTE — Anesthesia Postprocedure Evaluation (Signed)
 Anesthesia Post Note  Patient: Logan Phillips  Procedure(s) Performed: BRONCHOSCOPY, WITH FLUOROSCOPY     Patient location during evaluation: Endoscopy Anesthesia Type: General Level of consciousness: awake and alert Pain management: pain level controlled Vital Signs Assessment: post-procedure vital signs reviewed and stable Respiratory status: spontaneous breathing, nonlabored ventilation, respiratory function stable and patient connected to nasal cannula oxygen Cardiovascular status: blood pressure returned to baseline and stable Postop Assessment: no apparent nausea or vomiting Anesthetic complications: no  No notable events documented.  Last Vitals:  Vitals:   09/06/23 1310 09/06/23 1320  BP: (!) 155/95 (!) 158/97  Pulse: 94 73  Resp: 20 16  Temp:    SpO2: 100% 97%    Last Pain:  Vitals:   09/06/23 1320  TempSrc:   PainSc: 0-No pain                 Logan Phillips

## 2023-09-06 NOTE — Interval H&P Note (Signed)
 Patient presents for bronchoscopy with BAL and brushings.

## 2023-09-06 NOTE — Anesthesia Procedure Notes (Signed)
 Procedure Name: Intubation Date/Time: 09/06/2023 12:11 PM  Performed by: Delores Duwaine SAUNDERS, CRNAPre-anesthesia Checklist: Patient identified, Emergency Drugs available, Suction available and Patient being monitored Patient Re-evaluated:Patient Re-evaluated prior to induction Oxygen Delivery Method: Circle System Utilized Preoxygenation: Pre-oxygenation with 100% oxygen Induction Type: IV induction Ventilation: Mask ventilation without difficulty Laryngoscope Size: Mac and 4 Grade View: Grade I Tube type: Oral Tube size: 9.0 mm Number of attempts: 1 Airway Equipment and Method: Stylet and Oral airway Placement Confirmation: ETT inserted through vocal cords under direct vision, positive ETCO2 and breath sounds checked- equal and bilateral Secured at: 22 cm Tube secured with: Tape Dental Injury: Teeth and Oropharynx as per pre-operative assessment

## 2023-09-06 NOTE — Discharge Instructions (Addendum)
 Take it easy today, rest. Can resume normal activity tomorrow.  No changes to any medications.  I will contact you with the results likely next week once the pathology report returns.  Normal to be coughing a little, even some blood. This should resolve in the next 24 hours. Low grade fevers could occur - ok to take tylenol .  If coughing up large amounts of blood (more than a spoon full,) or having sudden onset sharp chest pain or shortness of breath, seek emergency care.

## 2023-09-06 NOTE — Transfer of Care (Signed)
 Immediate Anesthesia Transfer of Care Note  Patient: Logan Phillips  Procedure(s) Performed: BRONCHOSCOPY, WITH FLUOROSCOPY  Patient Location: PACU  Anesthesia Type:General  Level of Consciousness: sedated  Airway & Oxygen Therapy: Patient Spontanous Breathing  Post-op Assessment: Report given to RN  Post vital signs: Reviewed and stable  Last Vitals:  Vitals Value Taken Time  BP 132/76 09/06/23 13:02  Temp    Pulse 75 09/06/23 13:06  Resp 17 09/06/23 13:06  SpO2 96 % 09/06/23 13:06  Vitals shown include unfiled device data.  Last Pain:  Vitals:   09/06/23 1302  TempSrc:   PainSc: Asleep         Complications: No notable events documented.

## 2023-09-06 NOTE — Progress Notes (Signed)
 Dr Meade reviewed CXR. Per Dr Meade, okay to d/c to home.  Ozell VEAR Pouch, RN 09/06/23 1:44 PM

## 2023-09-08 ENCOUNTER — Ambulatory Visit (INDEPENDENT_AMBULATORY_CARE_PROVIDER_SITE_OTHER): Admitting: Medical

## 2023-09-08 ENCOUNTER — Encounter: Payer: Self-pay | Admitting: Medical

## 2023-09-08 ENCOUNTER — Ambulatory Visit: Payer: Self-pay | Admitting: Internal Medicine

## 2023-09-08 VITALS — BP 128/70 | HR 85 | Ht 67.0 in | Wt 144.0 lb

## 2023-09-08 DIAGNOSIS — R918 Other nonspecific abnormal finding of lung field: Secondary | ICD-10-CM | POA: Diagnosis not present

## 2023-09-08 DIAGNOSIS — F1090 Alcohol use, unspecified, uncomplicated: Secondary | ICD-10-CM | POA: Diagnosis not present

## 2023-09-08 DIAGNOSIS — R9389 Abnormal findings on diagnostic imaging of other specified body structures: Secondary | ICD-10-CM | POA: Diagnosis not present

## 2023-09-08 DIAGNOSIS — J189 Pneumonia, unspecified organism: Secondary | ICD-10-CM

## 2023-09-08 DIAGNOSIS — Z789 Other specified health status: Secondary | ICD-10-CM

## 2023-09-08 DIAGNOSIS — J984 Other disorders of lung: Secondary | ICD-10-CM

## 2023-09-08 DIAGNOSIS — T17908D Unspecified foreign body in respiratory tract, part unspecified causing other injury, subsequent encounter: Secondary | ICD-10-CM

## 2023-09-08 LAB — SURGICAL PATHOLOGY

## 2023-09-08 NOTE — Progress Notes (Signed)
 Subjective:  Logan Phillips is a 73 y.o. male who presents for Chief Complaint  Patient presents with   other    Procedure F/U- no issues     Here for follow-up.  He saw pulmonology recently 09/01/2023.  He had a bronchial lavage last week.  He is here to discuss results.  He has no new or different symptoms today.  He wonders if he needs to be on any more antibiotics.  Because it has been 95 degrees or higher this week he is staying out of the sun.  No other aggravating or relieving factors.    No other c/o.   Past Medical History:  Diagnosis Date   Chronic pain in shoulder 2018   [facility]   H/O echocardiogram 10/08/2016   EF 65-70%, normal wall motion, systolic function vigorous   Hearing loss    pending hearing aids 06/2014   Hypertension    Lipoma    Pneumonia    Varicose vein    Vestibular schwannoma (HCC) 04/2018   Current Outpatient Medications on File Prior to Visit  Medication Sig Dispense Refill   acetaminophen  (TYLENOL ) 500 MG tablet Take 1,000 mg by mouth every 6 (six) hours as needed.     allopurinol (ZYLOPRIM) 100 MG tablet TAKE 1 TABLET BY MOUTH EVERY DAY 90 tablet 1   amphetamine -dextroamphetamine  (ADDERALL) 10 MG tablet Take 1 tablet (10 mg total) by mouth daily with breakfast. 30 tablet 0   lisinopril  (ZESTRIL ) 2.5 MG tablet TAKE 1 TABLET(2.5 MG) BY MOUTH IN THE MORNING AND AT BEDTIME 180 tablet 0   meloxicam  (MOBIC ) 15 MG tablet TAKE 1 TABLET(15 MG) BY MOUTH DAILY 90 tablet 0   Multiple Vitamin (MULITIVITAMIN WITH MINERALS) TABS Take 1 tablet by mouth daily.     Oxycodone  HCl 10 MG TABS Take 10 mg by mouth 5 (five) times daily.     rosuvastatin  (CRESTOR ) 10 MG tablet Take 1 tablet (10 mg total) by mouth daily. 90 tablet 3   Suvorexant  (BELSOMRA ) 15 MG TABS Take 1 tablet (15 mg total) by mouth at bedtime as needed. 30 tablet 0   Testosterone  25 MG/2.5GM (1%) GEL Apply 2 Pump topically daily. 75 g 2   lisinopril  (ZESTRIL ) 10 MG tablet TAKE 1  TABLET BY MOUTH IN THE MORNING AND 1/2 TABLET IN THE AFTERNOON (Patient not taking: Reported on 09/08/2023) 135 tablet 1   No current facility-administered medications on file prior to visit.     The following portions of the patient's history were reviewed and updated as appropriate: allergies, current medications, past family history, past medical history, past social history, past surgical history and problem list.  ROS Otherwise as in subjective above    Objective: BP 128/70   Pulse 85   Ht 5' 7 (1.702 m)   Wt 144 lb (65.3 kg)   BMI 22.55 kg/m   General appearance: alert, no distress, well developed, well nourished    Assessment: Encounter Diagnoses  Name Primary?   Abnormal chest x-ray Yes   Cavitary pneumonia    Alcohol use    Abnormal lung field    Alcohol consumption heavy    Aspiration into airway, subsequent encounter      Plan: I reviewed his recent pulmonology notes from 09/01/2023 visit with Dr. Verdon Gore.  Differential is granulomatosis with polyangiitis, malignancy, infection or other.  He has not improved with prolonged antibiotic use.  He also per pulmonology notes still snorts pain medication through the nostrils.  He denies IV drugs.  He does drink alcohol heavily.  I advised that I looked at the results and preliminary culture shows no infection but the final is not back yet.  Likewise the cytology results are still pending  Thus I advised that there is nothing new and we have results to get him just yet.  His results should be back over the next week and  No new intervention today.  I advised he continue to stay out of high temperature since it is really hot and mostly on the right now 95+ degrees.  Take it easy, expect a call back on results within the next week   Logan Phillips was seen today for other.  Diagnoses and all orders for this visit:  Abnormal chest x-ray  Cavitary pneumonia  Alcohol use  Abnormal lung field  Alcohol consumption  heavy  Aspiration into airway, subsequent encounter    Follow up: pending results

## 2023-09-08 NOTE — Telephone Encounter (Signed)
 Called patient to review biopsy results. Went straight to voicemail and left message for call back. Please have him leave a reliable telephone number for me to call him.

## 2023-09-09 NOTE — Telephone Encounter (Signed)
 Copied from CRM [id number]. Topic: Clinical - Medication Question >> Sep 07, 2023  8:06 AM Benton O wrote: Reason for CRM: patient is requesting that dr meade start him back on the antibiotics he was taken before.thinking it was a combination of 2. Patient doesn't remember name . Patient said she prescribed this like 3 months ago  [facility] #[id number] [place], [place] - [address] AT [facility] OF [address] & [facility] [address] [place] [address] Phone: [phone] Fax: [phone] Hours: Not open 24 hours >> Sep 09, 2023 10:22 AM Joesph PARAS wrote: Patient is calling again to demand antibiotics right now. He states he knows what he has and desperately needs antibiotics. Patient is upset that he did not hear back yesterday. Please reach out to patient.   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 Patient stated he can not wait until Monday to get something medication.  Dr.Ramaswamy can you please advise.

## 2023-09-12 ENCOUNTER — Telehealth: Payer: Self-pay

## 2023-09-12 LAB — AEROBIC/ANAEROBIC CULTURE W GRAM STAIN (SURGICAL/DEEP WOUND)
Culture: NO GROWTH
Gram Stain: NONE SEEN

## 2023-09-12 NOTE — Telephone Encounter (Signed)
 Copied from CRM [id number]. Topic: Clinical - Medication Question >> Sep 12, 2023 11:09 AM Russell PARAS wrote: Reason for CRM:   Pt is requesting Meade send in order for prednisone , states currently has pneumonia. When asked about symptoms, he reports that Dr. Meade knows all about his situation. When asked for further info, the pt disconnected call.   CB# [phone]

## 2023-09-12 NOTE — Progress Notes (Signed)
Results through My Chart

## 2023-09-12 NOTE — Progress Notes (Signed)
 Results sent through MyChart

## 2023-09-15 ENCOUNTER — Telehealth: Payer: Self-pay | Admitting: *Deleted

## 2023-09-15 LAB — CYTOLOGY - NON PAP

## 2023-09-15 NOTE — Telephone Encounter (Signed)
 2nd message pt inquiring about the results of his bronchoscopy. On 6/30 there was another message re: rx prednisone .

## 2023-09-15 NOTE — Progress Notes (Signed)
 Cytology shows no cancer cells, but reactive signs of inflammation.  Culture showed no infection  AVOID snorting anything.  Avoid excess fumes, smoke, powders.  Uses masks when handling things that would create dust or lots of fumes.     I will await feedback from pulmonology for other suggestions.  No sign that we need to add antibiotics

## 2023-09-15 NOTE — Telephone Encounter (Signed)
 LMTCB x1  Copied from CRM [id number]. Topic: Clinical - Lab/Test Results >> Sep 13, 2023 11:20 AM Rilla NOVAK wrote: Reason for CRM:  Patient called stating he is returning a call to the office regarding his test results. Please call patient. (Verified phone number)

## 2023-09-19 NOTE — Telephone Encounter (Signed)
 Called patient to review biopsy results. He is feeling well, no pulmonary symptoms. Reviewed lipoid pneumonia diagnosis - he is going to stop drinking mineral oil daily. Also reviewed cytology positive for AFB - again he is not symptomatic and ct findings not as consistent with MAI infection so interpreting this as colonization vs infection for now. Since he is feeling well we discussed limited utility for prednisone  at this time. All questions answered. 6 month f/u. He will call sooner if infections signs/symptoms.

## 2023-09-19 NOTE — Telephone Encounter (Signed)
 Addressed in separate encounter.

## 2023-09-19 NOTE — Telephone Encounter (Signed)
 Chart reviewed.  The Pulmonology office has reached out to patient now.  It appears the patients frustration is for the pulmonology office, not ours.  Please advise if dismissal still wanted.

## 2023-09-20 NOTE — Telephone Encounter (Signed)
 Done NFN

## 2023-09-22 ENCOUNTER — Other Ambulatory Visit: Payer: Self-pay | Admitting: Medical

## 2023-09-22 MED ORDER — AMPHETAMINE-DEXTROAMPHETAMINE 10 MG PO TABS: 10.0000 mg | ORAL_TABLET | Freq: Every day | ORAL | 0 refills | Status: DC

## 2023-09-22 NOTE — Telephone Encounter (Signed)
 Copied from CRM [id number]. Topic: Clinical - Medication Refill >> Sep 22, 2023  9:46 AM Graeme ORN wrote: Medication: amphetamine -dextroamphetamine  (ADDERALL) 10 MG tablet  Has the patient contacted their pharmacy? No (Agent: If no, request that the patient contact the pharmacy for the refill. If patient does not wish to contact the pharmacy document the reason why and proceed with request.) (Agent: If yes, when and what did the pharmacy advise?)  This is the patient's preferred pharmacy:  [facility] #[id number] [place],  - [address] AT [facility] OF [address] & [facility] [address] [place] [address] Phone: [phone] Fax: [phone]   Is this the correct pharmacy for this prescription? Yes If no, delete pharmacy and type the correct one.   Has the prescription been filled recently? Yes  Is the patient out of the medication? Yes  Has the patient been seen for an appointment in the last year OR does the patient have an upcoming appointment? Yes  Can we respond through MyChart? Yes  Agent: Please be advised that Rx refills may take up to 3 business days. We ask that you follow-up with your pharmacy.

## 2023-09-23 ENCOUNTER — Telehealth: Payer: Self-pay

## 2023-09-23 DIAGNOSIS — J189 Pneumonia, unspecified organism: Secondary | ICD-10-CM

## 2023-09-23 NOTE — Telephone Encounter (Unsigned)
 Copied from CRM [id number]. Topic: Clinical - Medication Question >> Sep 23, 2023 11:57 AM Joesph PARAS wrote: Reason for CRM: Patient is requesting a prescription for assistance in breathing or keeping his lungs clear with the heat and humidity. Patient states provider should know all about the underlying causes and declined to explain further.

## 2023-09-24 ENCOUNTER — Other Ambulatory Visit: Payer: Self-pay | Admitting: Medical

## 2023-09-27 NOTE — Telephone Encounter (Signed)
 Copied from CRM [id number]. Topic: Clinical - Medication Question Reason for CRM: Patient is calling in once again, states he never heard anything. Patient states is developing a night-cough with phlegm that is colorless. Please advise patient.   Patient calling again.

## 2023-09-28 MED ORDER — ALBUTEROL SULFATE (2.5 MG/3ML) 0.083% IN NEBU: 2.5000 mg | INHALATION_SOLUTION | Freq: Four times a day (QID) | RESPIRATORY_TRACT | 12 refills | Status: DC | PRN

## 2023-09-28 NOTE — Telephone Encounter (Signed)
 FYI: Pt notified order placed he states he woke up with lung pain today. I asked if he had been coughing a lot lately pt states yes, I advised him to keep an eye on this pain as he thinks he may have just pulled a muscle from coughing. Pt will reach out if pain does not get any better.

## 2023-09-28 NOTE — Telephone Encounter (Signed)
 Ok to send in albuterol  nebulizer treatments and nebulizer machine for patient.

## 2023-09-29 ENCOUNTER — Telehealth: Payer: Self-pay | Admitting: Internal Medicine

## 2023-09-29 NOTE — Telephone Encounter (Signed)
 Per Paticia at [facility]-  Can we have provider updated his 09/01/2023 note mentioning the need for the nebulizer ,please.

## 2023-10-03 NOTE — Telephone Encounter (Signed)
 Informed DME NFN

## 2023-10-03 NOTE — Telephone Encounter (Signed)
 Find to send albuterol  prn

## 2023-10-03 NOTE — Telephone Encounter (Signed)
 Sent to DME company NFN

## 2023-10-03 NOTE — Telephone Encounter (Unsigned)
 Copied from CRM [id number]. Topic: Clinical - Prescription Issue >> Sep 30, 2023 12:50 PM Whitney O wrote: Reason for CRM: patient is calling because dr meade she was going to send me some type of machine to the house i dont really need the machine . just need the inhaler dont need the machine makes it lot easier for me

## 2023-10-03 NOTE — Telephone Encounter (Signed)
 Documentation from 6/19 office visit has been addended

## 2023-10-03 NOTE — Telephone Encounter (Signed)
 Per Avelina at [facility]-  We just need a narrative in the OV note mentioning the need for the nebulizer.

## 2023-10-05 NOTE — Telephone Encounter (Signed)
 We are not seeing notes from 6/19 mentioning a narrative for a nebulizer. Please advise

## 2023-10-06 MED ORDER — ALBUTEROL SULFATE HFA 108 (90 BASE) MCG/ACT IN AERS: 2.0000 | INHALATION_SPRAY | Freq: Four times a day (QID) | RESPIRATORY_TRACT | 6 refills | Status: DC | PRN

## 2023-10-06 NOTE — Addendum Note (Signed)
 Addended by: Ronnica Dreese on: 10/06/2023 03:20 PM   Modules accepted: Orders

## 2023-10-06 NOTE — Telephone Encounter (Signed)
 I have sent this to the DME company. NFN

## 2023-10-06 NOTE — Telephone Encounter (Signed)
Should be in there now 

## 2023-10-17 ENCOUNTER — Telehealth: Payer: Self-pay | Admitting: *Deleted

## 2023-10-17 NOTE — Telephone Encounter (Signed)
 Copied from CRM [id number]. Topic: Appointments - Transfer of Care >> Sep 28, 2023  9:03 AM Isabell A wrote: Pt is requesting to transfer FROM: Dr.Desai  Pt is requesting to transfer TO: Any provider that is available Reason for requested transfer: Only wants a second opinion (did not provide further details) It is the responsibility of the team the patient would like to transfer to reach out to the patient if for any reason this transfer is not acceptable. >> Sep 28, 2023 10:04 AM Charlanne KIDD wrote: Fwd to [facility] to ask if this is OK and to advise who we should call and resched PT with. TY.   ATC at [phone].  LVM to return call.  Mychart message sent as well.

## 2023-10-18 ENCOUNTER — Other Ambulatory Visit: Payer: Self-pay | Admitting: Medical

## 2023-10-18 MED ORDER — AMPHETAMINE-DEXTROAMPHETAMINE 10 MG PO TABS: 10.0000 mg | ORAL_TABLET | Freq: Every day | ORAL | 0 refills | Status: DC

## 2023-10-18 NOTE — Telephone Encounter (Signed)
 Copied from CRM #[id number]. Topic: Clinical - Medication Refill >> Oct 18, 2023 10:10 AM Donee H wrote: Medication: amphetamine -dextroamphetamine  (ADDERALL) 10 MG tablet   Has the patient contacted their pharmacy? No (Agent: If no, request that the patient contact the pharmacy for the refill. If patient does not wish to contact the pharmacy document the reason why and proceed with request.) (Agent: If yes, when and what did the pharmacy advise?)  This is the patient's preferred pharmacy:  [facility] #[id number] [address], [place] - [address] AT [facility] OF [address] & [facility] [address] [place] [address] Phone: [phone] Fax: [phone]    Is this the correct pharmacy for this prescription? Yes If no, delete pharmacy and type the correct one.   Has the prescription been filled recently? Yes  Is the patient out of the medication? No but only have 2 left   Has the patient been seen for an appointment in the last year OR does the patient have an upcoming appointment? Yes  Can we respond through MyChart? Yes  Agent: Please be advised that Rx refills may take up to 3 business days. We ask that you follow-up with your pharmacy.

## 2023-10-27 ENCOUNTER — Ambulatory Visit (INDEPENDENT_AMBULATORY_CARE_PROVIDER_SITE_OTHER): Admitting: Medical

## 2023-10-27 VITALS — BP 120/70 | HR 78 | Wt 140.6 lb

## 2023-10-27 DIAGNOSIS — T402X5A Adverse effect of other opioids, initial encounter: Secondary | ICD-10-CM | POA: Diagnosis not present

## 2023-10-27 DIAGNOSIS — J189 Pneumonia, unspecified organism: Secondary | ICD-10-CM | POA: Diagnosis not present

## 2023-10-27 DIAGNOSIS — Z789 Other specified health status: Secondary | ICD-10-CM

## 2023-10-27 DIAGNOSIS — G8929 Other chronic pain: Secondary | ICD-10-CM

## 2023-10-27 DIAGNOSIS — R41840 Attention and concentration deficit: Secondary | ICD-10-CM

## 2023-10-27 DIAGNOSIS — M545 Low back pain, unspecified: Secondary | ICD-10-CM

## 2023-10-27 DIAGNOSIS — R918 Other nonspecific abnormal finding of lung field: Secondary | ICD-10-CM | POA: Diagnosis not present

## 2023-10-27 DIAGNOSIS — K5903 Drug induced constipation: Secondary | ICD-10-CM | POA: Diagnosis not present

## 2023-10-27 DIAGNOSIS — R0989 Other specified symptoms and signs involving the circulatory and respiratory systems: Secondary | ICD-10-CM

## 2023-10-27 DIAGNOSIS — J984 Other disorders of lung: Secondary | ICD-10-CM

## 2023-10-27 MED ORDER — NALOXEGOL OXALATE 25 MG PO TABS: 25.0000 mg | ORAL_TABLET | Freq: Every day | ORAL | 2 refills | Status: DC

## 2023-10-27 MED ORDER — DOCUSATE SODIUM 100 MG PO CAPS: 100.0000 mg | ORAL_CAPSULE | Freq: Two times a day (BID) | ORAL | 0 refills | Status: DC

## 2023-10-27 NOTE — Progress Notes (Signed)
 Subjective:  Logan Phillips is a 73 y.o. male who presents for Chief Complaint  Patient presents with   Consult    Discuss health in general     Here for med check and follow up  Having bad constipation.  Fiber and probiotics doesn't help.  Uses a lot of probiotics, but not helping.   Only going once every 4-5 days  Uses oxycodone  5 times daily per pain management.   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    Planning to retire end of this year possibly  He is feeling good from a standpoint of breathing and energy.  He cut out using oral mineral oil for constipation which may have triggered possible pneumonia.  Saw pulmonology in recent months.  Hypertension-blood pressure tend to fluctuate still.  He takes lisinopril  2.5 mg daily.  On days when his systolic is over 859 he will take 2 of the lisinopril  tablets  Doing fine on his stimulant medicine Adderall 10 mg daily  Compliant with Crestor .  Compliant with rest of medicines as usual  No other aggravating or relieving factors.    No other c/o.   Past Medical History:  Diagnosis Date   Chronic pain in shoulder 2018   [facility]   H/O echocardiogram 10/08/2016   EF 65-70%, normal wall motion, systolic function vigorous   Hearing loss    pending hearing aids 06/2014   Hypertension    Lipoma    Pneumonia    Varicose vein    Vestibular schwannoma (HCC) 04/2018   Current Outpatient Medications on File Prior to Visit  Medication Sig Dispense Refill   acetaminophen  (TYLENOL ) 500 MG tablet Take 1,000 mg by mouth every 6 (six) hours as needed.     albuterol  (PROVENTIL ) (2.5 MG/3ML) 0.083% nebulizer solution Take 3 mLs (2.5 mg total) by nebulization every 6 (six) hours as needed for wheezing or shortness of breath. 75 mL 12   albuterol  (VENTOLIN  HFA) 108 (90 Base) MCG/ACT inhaler Inhale 2 puffs into the lungs every 6 (six) hours as needed for wheezing or  shortness of breath. 8 g 6   allopurinol (ZYLOPRIM) 100 MG tablet TAKE 1 TABLET BY MOUTH EVERY DAY 90 tablet 1   amphetamine -dextroamphetamine  (ADDERALL) 10 MG tablet Take 1 tablet (10 mg total) by mouth daily with breakfast. 30 tablet 0   lisinopril  (ZESTRIL ) 2.5 MG tablet TAKE 1 TABLET(2.5 MG) BY MOUTH IN THE MORNING AND AT BEDTIME 180 tablet 0   meloxicam  (MOBIC ) 15 MG tablet TAKE 1 TABLET(15 MG) BY MOUTH DAILY 90 tablet 0   Multiple Vitamin (MULITIVITAMIN WITH MINERALS) TABS Take 1 tablet by mouth daily.     Oxycodone  HCl 10 MG TABS Take 10 mg by mouth 5 (five) times daily.     rosuvastatin  (CRESTOR ) 10 MG tablet Take 1 tablet (10 mg total) by mouth daily. 90 tablet 3   Suvorexant  (BELSOMRA ) 15 MG TABS TAKE 1 TABLET(15 MG) BY MOUTH AT BEDTIME AS NEEDED 30 tablet 2   Testosterone  25 MG/2.5GM (1%) GEL Apply 2 Pump topically daily. 75 g 2   No current facility-administered medications on file prior to visit.   The following portions of the patient's history were reviewed and updated as appropriate: allergies, current medications, past family history, past medical history, past social history, past surgical history and problem list.  ROS Otherwise as in subjective above    Objective: BP 120/70  Pulse 78   Wt 140 lb 9.6 oz (63.8 kg)   BMI 22.02 kg/m   General appearance: alert, no distress, well developed, well nourished Neck: supple, no lymphadenopathy, no thyromegaly, no masses Heart: RRR, normal S1, S2, no murmurs Lungs: decreased right lower lung fields, otherwise clear, no wheezes, rhonchi, or rales Abdomen: +bs, soft, non tender, non distended, no masses, no hepatomegaly, no splenomegaly Pulses: 2+ radial pulses, 2+ pedal pulses, normal cap refill Ext: no edema    Assessment: Encounter Diagnoses  Name Primary?   Opioid-induced constipation Yes   Abnormal lung field    Cavitary pneumonia    Regular alcohol consumption    Chronic low back pain without sciatica,  unspecified back pain laterality    Attention deficit    Labile hypertension      Plan: Opioid-induced constipation Counseled on fiber and water intake Begin Movantik  trial once daily Continue colace prn  Chronic pain Follow-up with pain management regarding medications and need to cut down on chronic opioids  Abnormal lung field,  cavitary pneumonia He cut out using mineral oil by mouth to help with constipation.  That was a concern for trigger for his recent pneumonia issue. Follow up soon with pulmonology  Attention deficit Continue Adderall 10mg  daily  Regular alcohol consumption Needs to cut down and quit alcohol  Hypertension Continue lisinopril  2.5 mg daily or 2 tablets daily when systolic blood pressure greater than 140    Manus was seen today for consult.  Diagnoses and all orders for this visit:  Opioid-induced constipation  Abnormal lung field  Cavitary pneumonia  Regular alcohol consumption  Chronic low back pain without sciatica, unspecified back pain laterality  Attention deficit  Labile hypertension  Other orders -     naloxegol  oxalate (MOVANTIK ) 25 MG TABS tablet; Take 1 tablet (25 mg total) by mouth daily. -     docusate sodium  (COLACE) 100 MG capsule; Take 1 capsule (100 mg total) by mouth 2 (two) times daily.    Follow up: with pulmonology, pain management

## 2023-10-31 ENCOUNTER — Telehealth: Payer: Self-pay

## 2023-10-31 ENCOUNTER — Other Ambulatory Visit: Payer: Self-pay | Admitting: Medical

## 2023-10-31 NOTE — Telephone Encounter (Signed)
 Copied from CRM [id number]. Topic: Clinical - Prescription Issue >> Oct 31, 2023  2:37 PM Logan Phillips ORN wrote: Reason for CRM: Patient called. States he was seen - Prescribed something for constipation. States that one is over the counter. He would like to go with the other option [name] offered. Something medically prescribed. Thank You

## 2023-11-01 NOTE — Telephone Encounter (Signed)
 Pharmacy stated RX was ready for pickup with no copay. LMV for pt stating meds are ready at pharmacy.

## 2023-11-07 ENCOUNTER — Other Ambulatory Visit: Payer: Self-pay | Admitting: Medical

## 2023-11-17 ENCOUNTER — Telehealth: Payer: Self-pay | Admitting: Medical

## 2023-11-17 NOTE — Telephone Encounter (Unsigned)
 Copied from CRM #[id number]. Topic: Clinical - Request for Lab/Test Order >> Nov 17, 2023  4:05 PM Donee H wrote: Reason for CRM: Patient is requesting to have annual blood work ordered

## 2023-11-18 ENCOUNTER — Other Ambulatory Visit: Payer: Self-pay | Admitting: Medical

## 2023-11-18 MED ORDER — TESTOSTERONE 25 MG/2.5GM (1%) TD GEL: 2.0000 | Freq: Every day | TRANSDERMAL | 2 refills | Status: DC

## 2023-11-18 MED ORDER — AMPHETAMINE-DEXTROAMPHETAMINE 10 MG PO TABS: 10.0000 mg | ORAL_TABLET | Freq: Every day | ORAL | 0 refills | Status: DC

## 2023-11-18 NOTE — Telephone Encounter (Signed)
 Copied from CRM [id number]. Topic: Clinical - Medication Refill >> Nov 18, 2023 10:23 AM Selinda RAMAN wrote: Medication: amphetamine -dextroamphetamine  (ADDERALL) 10 MG tablet, Testosterone  25 MG/2.5GM (1%) GEL  Has the patient contacted their pharmacy? No   This is the patient's preferred pharmacy:  [facility] #[id number] [place] [place], [place] - [address] AT [facility] OF [address] & [facility] [address] [place] [address] Phone: [phone] Fax: [phone]    Is this the correct pharmacy for this prescription? No If no, delete pharmacy and type the correct one.   Has the prescription been filled recently? No  Is the patient out of the medication? Yes as far as the gel but he has a few days left of his Adderall  Has the patient been seen for an appointment in the last year OR does the patient have an upcoming appointment? Yes  Can we respond through MyChart? Yes  Please assist patient further

## 2023-11-21 ENCOUNTER — Ambulatory Visit (INDEPENDENT_AMBULATORY_CARE_PROVIDER_SITE_OTHER): Admitting: Medical

## 2023-11-21 ENCOUNTER — Encounter: Payer: Self-pay | Admitting: Medical

## 2023-11-21 VITALS — BP 110/72 | HR 84 | Ht 67.0 in | Wt 134.8 lb

## 2023-11-21 DIAGNOSIS — Z0000 Encounter for general adult medical examination without abnormal findings: Secondary | ICD-10-CM | POA: Diagnosis not present

## 2023-11-21 DIAGNOSIS — I10 Essential (primary) hypertension: Secondary | ICD-10-CM

## 2023-11-21 DIAGNOSIS — Z1211 Encounter for screening for malignant neoplasm of colon: Secondary | ICD-10-CM

## 2023-11-21 DIAGNOSIS — R41840 Attention and concentration deficit: Secondary | ICD-10-CM

## 2023-11-21 DIAGNOSIS — Z7185 Encounter for immunization safety counseling: Secondary | ICD-10-CM

## 2023-11-21 DIAGNOSIS — Z79899 Other long term (current) drug therapy: Secondary | ICD-10-CM

## 2023-11-21 DIAGNOSIS — G8929 Other chronic pain: Secondary | ICD-10-CM

## 2023-11-21 DIAGNOSIS — Z125 Encounter for screening for malignant neoplasm of prostate: Secondary | ICD-10-CM

## 2023-11-21 DIAGNOSIS — K5909 Other constipation: Secondary | ICD-10-CM

## 2023-11-21 DIAGNOSIS — M545 Low back pain, unspecified: Secondary | ICD-10-CM

## 2023-11-21 DIAGNOSIS — Z8739 Personal history of other diseases of the musculoskeletal system and connective tissue: Secondary | ICD-10-CM

## 2023-11-21 DIAGNOSIS — E785 Hyperlipidemia, unspecified: Secondary | ICD-10-CM | POA: Diagnosis not present

## 2023-11-21 DIAGNOSIS — F1011 Alcohol abuse, in remission: Secondary | ICD-10-CM

## 2023-11-21 DIAGNOSIS — Z87898 Personal history of other specified conditions: Secondary | ICD-10-CM

## 2023-11-21 DIAGNOSIS — G4700 Insomnia, unspecified: Secondary | ICD-10-CM | POA: Diagnosis not present

## 2023-11-21 DIAGNOSIS — R0989 Other specified symptoms and signs involving the circulatory and respiratory systems: Secondary | ICD-10-CM

## 2023-11-21 DIAGNOSIS — K219 Gastro-esophageal reflux disease without esophagitis: Secondary | ICD-10-CM

## 2023-11-21 DIAGNOSIS — I2510 Atherosclerotic heart disease of native coronary artery without angina pectoris: Secondary | ICD-10-CM

## 2023-11-21 DIAGNOSIS — E291 Testicular hypofunction: Secondary | ICD-10-CM

## 2023-11-21 LAB — LIPID PANEL

## 2023-11-21 NOTE — Progress Notes (Signed)
 Name: Logan Phillips   Date of Visit: 11/21/23   Date of last visit with me: 11/18/2023   CHIEF COMPLAINT:  Chief Complaint  Patient presents with   Annual Exam    Fasting cpe, , wants updated blood work. Declines flu shot       HPI:  Discussed the use of AI scribe software for clinical note transcription with the patient, who gave verbal consent to proceed.  History of Present Illness  Patient Care Team: Chi Woodham, Logan RAMAN, PA-C as PCP - General (Family Medicine) Orlando Anes, MD (Anesthesiology) Dr. Renaye Sous, GI Dr. Verdon Gore, pulmonology Dr. Ronal Ross, cardiology Dr. Gerldine Maizes, neurosurgery Dr. Lamar Millman, orthopedics Eye doctor    Concerns: Logan Phillips is a 73 year old male with a history of high blood pressure and brain tumor surgery who presents with persistent dizziness.  He experiences persistent dizziness daily, occurring intermittently throughout the day. The dizziness correlates with fluctuations in his blood pressure, with readings ranging from 180/120 to 110/70. He takes medication for dizziness, which provides some relief.  He has a history of high blood pressure and is currently on lisinopril . He underwent brain tumor surgery in 2020. He has not seen an ear, nose, and throat specialist recently.  He reports increased nocturia, a new symptom for him. He has not had a recent PSA test but had one in May of the previous year.  He has a history of back surgery, colonoscopy, and appendectomy. He denies smoking and consumes alcohol, specifically vodka, about once a day. He lives with an elderly handicapped individual and has three daughters.  He is currently on several medications, including allopurinol, Adderall, meloxicam , a multivitamin, Crestor , and testosterone  gel, which he applies once daily. He also takes Metamucil and stool softeners for constipation, along with a product called Maurilio, which he believes is helping with his bowel  movements.    Reviewed their medical, surgical, family, social, medication, and allergy history and updated chart as appropriate.  No Known Allergies  Past Medical History:  Diagnosis Date   Chronic pain in shoulder 2018   [facility]   H/O echocardiogram 10/08/2016   EF 65-70%, normal wall motion, systolic function vigorous   Hearing loss    pending hearing aids 06/2014   Hypertension    Lipoma    Pneumonia    Varicose vein    Vestibular schwannoma (HCC) 04/2018     Current Outpatient Medications:    acetaminophen  (TYLENOL ) 500 MG tablet, Take 1,000 mg by mouth every 6 (six) hours as needed., Disp: , Rfl:    albuterol  (PROVENTIL ) (2.5 MG/3ML) 0.083% nebulizer solution, Take 3 mLs (2.5 mg total) by nebulization every 6 (six) hours as needed for wheezing or shortness of breath., Disp: 75 mL, Rfl: 12   albuterol  (VENTOLIN  HFA) 108 (90 Base) MCG/ACT inhaler, Inhale 2 puffs into the lungs every 6 (six) hours as needed for wheezing or shortness of breath., Disp: 8 g, Rfl: 6   allopurinol (ZYLOPRIM) 100 MG tablet, TAKE 1 TABLET BY MOUTH EVERY DAY, Disp: 90 tablet, Rfl: 1   amphetamine -dextroamphetamine  (ADDERALL) 10 MG tablet, Take 1 tablet (10 mg total) by mouth daily with breakfast., Disp: 30 tablet, Rfl: 0   docusate sodium  (COLACE) 100 MG capsule, Take 1 capsule (100 mg total) by mouth 2 (two) times daily., Disp: 30 capsule, Rfl: 0   lisinopril  (ZESTRIL ) 2.5 MG tablet, TAKE 1 TABLET(2.5 MG) BY MOUTH IN THE MORNING AND AT BEDTIME, Disp:  180 tablet, Rfl: 0   meloxicam  (MOBIC ) 15 MG tablet, TAKE 1 TABLET(15 MG) BY MOUTH DAILY, Disp: 90 tablet, Rfl: 0   Multiple Vitamin (MULITIVITAMIN WITH MINERALS) TABS, Take 1 tablet by mouth daily., Disp: , Rfl:    Oxycodone  HCl 10 MG TABS, Take 10 mg by mouth 5 (five) times daily., Disp: , Rfl:    rosuvastatin  (CRESTOR ) 10 MG tablet, Take 1 tablet (10 mg total) by mouth daily., Disp: 90 tablet, Rfl: 3   Testosterone  25 MG/2.5GM (1%)  GEL, Apply 2 Pump topically daily., Disp: 75 g, Rfl: 2  Family History  Problem Relation Age of Onset   Hypertension Mother    Dementia Father    Parkinsonism Father    Diabetes Father        early stages   Cancer Maternal Grandmother        breast   Heart disease Neg Hx    Stroke Neg Hx     Past Surgical History:  Procedure Laterality Date   APPENDECTOMY     APPLICATION OF CRANIAL NAVIGATION Right 05/09/2018   Procedure: APPLICATION OF CRANIAL NAVIGATION;  Surgeon: Lanis Pupa, MD;  Location: [facility] OR;  Service: Neurosurgery;  Laterality: Right;   BIOPSY  04/12/2018   Procedure: BIOPSY;  Surgeon: Kristie Lamprey, MD;  Location: [facility] ENDOSCOPY;  Service: Endoscopy;;   COLONOSCOPY     age 32, he did not f/u with referral 2018   COLONOSCOPY WITH PROPOFOL  N/A 04/12/2018   Procedure: COLONOSCOPY WITH PROPOFOL ;  Surgeon: Kristie Lamprey, MD;  Location: [facility] ENDOSCOPY;  Service: Endoscopy;  Laterality: N/A;   CRANIOTOMY Right 05/09/2018   Procedure: Right retrosigmoid craniectomy, resection of acoustic neuroma with intraoperative facial monitoring/Brain Lab;  Surgeon: Lanis Pupa, MD;  Location: [facility] OR;  Service: Neurosurgery;  Laterality: Right;   LUMBAR DISC SURGERY  2002   Dr. Mora   VIDEO BRONCHOSCOPY N/A 09/06/2023   Procedure: BRONCHOSCOPY, WITH FLUOROSCOPY;  Surgeon: Meade Verdon RAMAN, MD;  Location: [facility] ENDOSCOPY;  Service: Pulmonary;  Laterality: N/A;   WISDOM TOOTH EXTRACTION       Review of Systems  Constitutional:  Negative for chills, fever, malaise/fatigue and weight loss.  HENT:  Negative for congestion, ear pain, hearing loss, sore throat and tinnitus.   Eyes:  Negative for blurred vision, pain and redness.  Respiratory:  Negative for cough, hemoptysis and shortness of breath.   Cardiovascular:  Negative for chest pain, palpitations, orthopnea, claudication and leg swelling.  Gastrointestinal:  Positive for constipation. Negative for abdominal pain, blood in stool,  diarrhea, nausea and vomiting.  Genitourinary:  Negative for dysuria, flank pain, frequency, hematuria and urgency.  Musculoskeletal:  Negative for falls, joint pain and myalgias.  Skin:  Negative for itching and rash.  Neurological:  Positive for dizziness. Negative for tingling, speech change, weakness and headaches.  Endo/Heme/Allergies:  Negative for polydipsia. Does not bruise/bleed easily.  Psychiatric/Behavioral:  Negative for depression and memory loss. The patient has insomnia. The patient is not nervous/anxious.      Objective:  BP 110/72   Pulse 84   Ht 5' 7 (1.702 m)   Wt 134 lb 12.8 oz (61.1 kg)   SpO2 95%   BMI 21.11 kg/m   Wt Readings from Last 3 Encounters:  11/21/23 134 lb 12.8 oz (61.1 kg)  10/27/23 140 lb 9.6 oz (63.8 kg)  09/08/23 144 lb (65.3 kg)   BP Readings from Last 3 Encounters:  11/21/23 110/72  10/27/23 120/70  09/08/23 128/70   General  appearance: alert, no distress, WD/WN, Caucasian male Skin: Scattered macules, no worrisome lesions HEENT: normocephalic, conjunctiva/corneas normal, sclerae anicteric, PERRLA, EOMi, nares patent, no discharge or erythema, pharynx normal Oral cavity: MMM, tongue normal, teeth normal Neck: supple, no lymphadenopathy, no thyromegaly, no masses, normal ROM, no bruits Chest: non tender, normal shape and expansion Heart: RRR, normal S1, S2, no murmurs Lungs: CTA bilaterally, no wheezes, rhonchi, or rales Abdomen: +bs, soft, non tender, non distended, no masses, no hepatomegaly, no splenomegaly, no bruits Back: non tender, normal ROM, no scoliosis Musculoskeletal: upper extremities non tender, no obvious deformity, normal ROM throughout, lower extremities non tender, no obvious deformity, normal ROM throughout Extremities: no edema, no cyanosis, no clubbing Pulses: 2+ symmetric, upper and lower extremities, normal cap refill Neurological: alert, oriented x 3, CN2-12 intact, strength normal upper extremities and lower  extremities, sensation normal throughout, DTRs 2+ throughout, no cerebellar signs, gait normal Psychiatric: normal affect, behavior normal, pleasant  GU/rectal - deferred    Assessment and Plan :   Encounter Diagnoses  Name Primary?   Encounter for health maintenance examination in adult Yes   Essential hypertension    History of brain tumor    Hyperlipidemia, unspecified hyperlipidemia type    Labile hypertension    Insomnia, unspecified type    Vaccine counseling    Attention deficit    Chronic low back pain without sciatica, unspecified back pain laterality    Coronary artery disease involving native coronary artery of native heart without angina pectoris    Hypogonadism in male    Screen for colon cancer    Gastroesophageal reflux disease, unspecified whether esophagitis present    History of alcohol abuse    Screening for prostate cancer    History of gout    Chronic constipation    High risk medication use     This visit was a preventative care visit, also known as wellness visit or routine physical.   Topics typically include healthy lifestyle, diet, exercise, preventative care, vaccinations, sick and well care, proper use of emergency dept and after hours care, as well as other concerns.     Separate significant issues discussed:  Labile hypertension-reviewed prior cardiology records, updated labs today  Dizziness, chronic -etiology unclear, updated labs as below, consider neurology or ENT consult  Chronic low back pain-uses meloxicam  and oxycodone , oxycodone  through another provider  Chronic constipation-he is using over-the-counter remedies, that seems to be helping  History of alcohol abuse-advise he limit or avoid alcohol.  History of gout-currently on allopurinol, labs today  Attention deficit-continue Adderall 10 mg daily  Low testosterone -on 1 pack of testosterone  daily, updated labs today   General Recommendations: Continue to return yearly for  your annual wellness and preventative care visits.  This gives us  a chance to discuss healthy lifestyle, exercise, vaccinations, review your chart record, and perform screenings where appropriate.  I recommend you see your eye doctor yearly for routine vision care.  I recommend you see your dentist yearly for routine dental care including hygiene visits twice yearly.   Vaccination  Immunization History  Administered Date(s) Administered   Pneumococcal Conjugate-13 10/04/2016   Pneumococcal Polysaccharide-23 10/05/2017   Tdap 07/08/2014   Zoster Recombinant(Shingrix ) 03/21/2017, 06/14/2017    Vaccine recommendations: Flu, prevnar 20, covid, tdap  Vaccines administered today: None, declines   Screening for cancer: Colon cancer screening: Prior or last colon cancer screen: 03/2018, Dr. Renaye Sous Refer back to gastro for possible EGD and colonoscopy   Prostate Cancer screening: The  recommended prostate cancer screening test is a blood test called the prostate-specific antigen (PSA) test. PSA is a protein that is made in the prostate. As you age, your prostate naturally produces more PSA. Abnormally high PSA levels may be caused by: Prostate cancer. An enlarged prostate that is not caused by cancer (benign prostatic hyperplasia, or BPH). This condition is very common in older men. A prostate gland infection (prostatitis) or urinary tract infection. Certain medicines such as male hormones (like testosterone ) or other medicines that raise testosterone  levels. A rectal exam may be done as part of prostate cancer screening to help provide information about the size of your prostate gland. When a rectal exam is performed, it should be done after the PSA level is drawn to avoid any effect on the results.   Skin cancer screening: Check your skin regularly for new changes, growing lesions, or other lesions of concern Come in for evaluation if you have skin lesions of concern.   Lung  cancer screening: If you have a greater than 20 pack year history of tobacco use, then you may qualify for lung cancer screening with a chest CT scan.   Please call your insurance company to inquire about coverage for this test.   Pancreatic cancer:  no current screening test is available or routinely recommended. (risk factors: smoking, overweight or obese, diabetes, chronic pancreatitis, work exposure - dry cleaning, metal working, 73yo>, M>F, Tree surgeon, family hx/o, hereditary breast, ovarian, melanoma, lynch, peutz-jeghers).  Symptoms: jaundice, dark urine, light color or greasy stools, itchy skin, belly or back pain, weight loss, poor appetite, nause, vomiting, liver enlargement, DVT/blood clots.   We currently don't have screenings for other cancers besides breast, cervical, colon, and lung cancers.  If you have a strong family history of cancer or have other cancer screening concerns, please let me know.  Genetic testing referral is an option for individuals with high cancer risk in the family.  There are some other cancer screenings in development currently.   Bone health: Get at least 150 minutes of aerobic exercise weekly Get weight bearing exercise at least once weekly Bone density test:  A bone density test is an imaging test that uses a type of X-ray to measure the amount of calcium  and other minerals in your bones. The test may be used to diagnose or screen you for a condition that causes weak or thin bones (osteoporosis), predict your risk for a broken bone (fracture), or determine how well your osteoporosis treatment is working. The bone density test is recommended for females 65 and older, or females or males <65 if certain risk factors such as thyroid  disease, long term use of steroids such as for asthma or rheumatological issues, vitamin D  deficiency, estrogen deficiency, family history of osteoporosis, self or family history of fragility fracture in first degree  relative.  Order placed for baseline screening given risk factors   Heart health: Get at least 150 minutes of aerobic exercise weekly Limit alcohol It is important to maintain a healthy blood pressure and healthy cholesterol numbers  Heart disease screening: Screening for heart disease includes screening for blood pressure, fasting lipids, glucose/diabetes screening, BMI height to weight ratio, reviewed of smoking status, physical activity, and diet.    Goals include blood pressure 120/80 or less, maintaining a healthy lipid/cholesterol profile, preventing diabetes or keeping diabetes numbers under good control, not smoking or using tobacco products, exercising most days per week or at least 150 minutes per week of exercise, and  eating healthy variety of fruits and vegetables, healthy oils, and avoiding unhealthy food choices like fried food, fast food, high sugar and high cholesterol foods.    Other tests may possibly include EKG test, CT coronary calcium  score, echocardiogram, exercise treadmill stress test.     Heart Disease Testing completed: Echocardiogram 10/01/2022 showing posterior lateral hypokinesis hyperdynamic apical function, LVEF 55 to 60%  Nuclear stress test 10/19/2022 showing normal low risk study no evidence of ischemia    Vascular disease screening: For higher risk individuals including smokers, diabetics, patients with known heart disease or high blood pressure, kidney disease, and others, screening for vascular disease or atherosclerosis of the arteries is available.  Examples may include carotid ultrasound, abdominal aortic ultrasound, ABI blood flow screening in the legs, thoracic aorta screening.     Medical care options: I recommend you continue to seek care here first for routine care.  We try really hard to have available appointments Monday through Friday daytime hours for sick visits, acute visits, and physicals.  Urgent care should be used for after hours and  weekends for significant issues that cannot wait till the next day.  The emergency department should be used for significant potentially life-threatening emergencies.  The emergency department is expensive, can often have long wait times for less significant concerns, so try to utilize primary care, urgent care, or telemedicine when possible to avoid unnecessary trips to the emergency department.  Virtual visits and telemedicine have been introduced since the pandemic started in 2020, and can be convenient ways to receive medical care.  We offer virtual appointments as well to assist you in a variety of options to seek medical care.   Legal  Take the time to do a last will and testament, Advanced Directives including Health Care Power of Attorney and Living Will documents.  Don't leave your family with burdens that can be handled ahead of time.   Advanced Directives: I recommend you consider completing a Health Care Power of Attorney and Living Will.   These documents respect your wishes and help alleviate burdens on your loved ones if you were to become terminally ill or be in a position to need those documents enforced.    You can complete Advanced Directives yourself, have them notarized, then have copies made for our office, for you and for anybody you feel should have them in safe keeping.  Or, you can have an attorney prepare these documents.   If you haven't updated your Last Will and Testament in a while, it may be worthwhile having an attorney prepare these documents together and save on some costs.       Jance was seen today for annual exam.  Diagnoses and all orders for this visit:  Encounter for health maintenance examination in adult -     Ambulatory referral to Gastroenterology -     PSA -     DG Bone Density; Future -     Cortisol -     Comprehensive metabolic panel with GFR -     CBC -     Lactate dehydrogenase -     Testosterone  -     TSH + free T4 -     Uric acid -      Lipid panel -     Drug Screen 10 W/Conf, Serum  Essential hypertension  History of brain tumor  Hyperlipidemia, unspecified hyperlipidemia type -     Lipid panel  Labile hypertension -     Cortisol -  Comprehensive metabolic panel with GFR -     CBC -     Lactate dehydrogenase -     TSH + free T4  Insomnia, unspecified type  Vaccine counseling  Attention deficit -     Drug Screen 10 W/Conf, Serum  Chronic low back pain without sciatica, unspecified back pain laterality  Coronary artery disease involving native coronary artery of native heart without angina pectoris  Hypogonadism in male -     Testosterone   Screen for colon cancer -     Ambulatory referral to Gastroenterology  Gastroesophageal reflux disease, unspecified whether esophagitis present -     Ambulatory referral to Gastroenterology  History of alcohol abuse -     Ambulatory referral to Gastroenterology -     DG Bone Density; Future  Screening for prostate cancer -     PSA  History of gout -     Uric acid  Chronic constipation  High risk medication use -     Drug Screen 10 W/Conf, Serum     Follow-up pending labs, yearly for physical

## 2023-11-22 ENCOUNTER — Other Ambulatory Visit: Payer: Self-pay | Admitting: Medical

## 2023-11-22 LAB — CBC
Hematocrit: 38.1 % (ref 37.5–51.0)
Hemoglobin: 12.1 g/dL — ABNORMAL LOW (ref 13.0–17.7)
MCH: 27 pg (ref 26.6–33.0)
MCHC: 31.8 g/dL (ref 31.5–35.7)
MCV: 85 fL (ref 79–97)
Platelets: 288 10*3/uL (ref 150–450)
RBC: 4.48 x10E6/uL (ref 4.14–5.80)
RDW: 13.4 % (ref 11.6–15.4)
WBC: 9.9 10*3/uL (ref 3.4–10.8)

## 2023-11-22 LAB — CORTISOL: Cortisol: 11 ug/dL (ref 6.2–19.4)

## 2023-11-22 LAB — COMPREHENSIVE METABOLIC PANEL WITH GFR
ALT: 8 IU/L (ref 0–44)
AST: 18 IU/L (ref 0–40)
Albumin: 4.2 g/dL (ref 3.8–4.8)
Alkaline Phosphatase: 84 IU/L (ref 44–121)
BUN/Creatinine Ratio: 19 (ref 10–24)
BUN: 21 mg/dL (ref 8–27)
Bilirubin Total: 0.4 mg/dL (ref 0.0–1.2)
CO2: 24 mmol/L (ref 20–29)
Calcium: 9.6 mg/dL (ref 8.6–10.2)
Chloride: 97 mmol/L (ref 96–106)
Creatinine, Ser: 1.1 mg/dL (ref 0.76–1.27)
Globulin, Total: 3.2 g/dL (ref 1.5–4.5)
Glucose: 90 mg/dL (ref 70–99)
Potassium: 4.9 mmol/L (ref 3.5–5.2)
Sodium: 138 mmol/L (ref 134–144)
Total Protein: 7.4 g/dL (ref 6.0–8.5)
eGFR: 71 mL/min/1.73 (ref >59)

## 2023-11-22 LAB — LIPID PANEL
Cholesterol, Total: 110 mg/dL (ref 100–199)
HDL: 58 mg/dL (ref >39)
LDL CALC COMMENT:: 1.9 ratio (ref 0.0–5.0)
LDL Chol Calc (NIH): 36 mg/dL (ref 0–99)
Triglycerides: 78 mg/dL (ref 0–149)
VLDL Cholesterol Cal: 16 mg/dL (ref 5–40)

## 2023-11-22 LAB — URIC ACID: Uric Acid: 5.1 mg/dL (ref 3.8–8.4)

## 2023-11-22 LAB — PSA: Prostate Specific Ag, Serum: 0.4 ng/mL (ref 0.0–4.0)

## 2023-11-22 LAB — TSH+FREE T4
Free T4: 1.17 ng/dL (ref 0.82–1.77)
TSH: 0.861 u[IU]/mL (ref 0.450–4.500)

## 2023-11-22 LAB — LACTATE DEHYDROGENASE: LDH: 183 IU/L (ref 121–224)

## 2023-11-22 LAB — TESTOSTERONE: Testosterone: 66 ng/dL — AB (ref 264–916)

## 2023-11-23 ENCOUNTER — Other Ambulatory Visit: Payer: Self-pay | Admitting: Medical

## 2023-11-23 ENCOUNTER — Ambulatory Visit: Payer: Self-pay | Admitting: Medical

## 2023-11-23 DIAGNOSIS — R0989 Other specified symptoms and signs involving the circulatory and respiratory systems: Secondary | ICD-10-CM

## 2023-11-23 DIAGNOSIS — R634 Abnormal weight loss: Secondary | ICD-10-CM

## 2023-11-23 NOTE — Progress Notes (Signed)
 Please have him come back in for additional labs.  We do not have enough blood for the remaining lab   Labs show hemoglobin slightly reduced, testosterone  low, much lower than expected.  The other labs are okay  I am confused as to why his testosterone  was low when he is using this reportedly daily.  Please ask about this  I recommend we change to injectable testosterone  here in our office every 2 to 3 weeks for the time being to see if we get more consistent results  Expect phone calls about referrals for bone density test and gastroenterology  I put in a referral into cardiology to have some follow-up regarding blood pressure better up and down.  Monitor blood pressures and take a list of readings with him when he goes to see cardiology for this and the dizziness  I put in an order for CT abdomen pelvis given that he has lost some more weight and he is having dizziness.  I want to rule out other unusual pain in the abdomen  I still may refer to neurology for dizziness below start with these recommendations and go

## 2023-11-25 ENCOUNTER — Other Ambulatory Visit

## 2023-11-30 ENCOUNTER — Other Ambulatory Visit: Payer: Self-pay | Admitting: Medical

## 2023-11-30 NOTE — Progress Notes (Signed)
 Go back over the prior labs and recommendations.  Still pending CT abdomen pelvis scan  We have to perform drug screens periodically when we are prescribing controlled substances.  When I recently saw him, he noted that he was in fact taking his Adderall daily which I have been prescribing.  Unfortunately this does not show up on the drug screen  If I am prescribing a controlled substance and quantities that he would be taking daily and he reports he is taking it daily and it does not show then we have a problem  I can no longer prescribe this medication for him given this inconsistency

## 2023-12-02 ENCOUNTER — Telehealth: Payer: Self-pay

## 2023-12-02 LAB — OPIATES,MS,WB/SP RFX
6-Acetylmorphine: NEGATIVE
Codeine: NEGATIVE ng/mL
Dihydrocodeine: NEGATIVE ng/mL
Hydrocodone: NEGATIVE ng/mL
Hydromorphone: NEGATIVE ng/mL
Morphine: NEGATIVE ng/mL
Opiate Confirmation: NEGATIVE

## 2023-12-02 LAB — DRUG SCREEN 10 W/CONF, SERUM
Amphetamines, IA: NEGATIVE ng/mL
Barbiturates, IA: NEGATIVE ug/mL
Benzodiazepines, IA: NEGATIVE ng/mL
Cocaine & Metabolite, IA: NEGATIVE ng/mL
Methadone, IA: NEGATIVE ng/mL
Oxycodones, IA: POSITIVE ng/mL — AB
Phencyclidine, IA: NEGATIVE ng/mL
Propoxyphene, IA: NEGATIVE ng/mL
THC(Marijuana) Metabolite, IA: NEGATIVE ng/mL

## 2023-12-02 LAB — OXYCODONES,MS,WB/SP RFX
Oxycocone: 96.2 ng/mL
Oxycodones Confirmation: POSITIVE
Oxymorphone: NEGATIVE ng/mL

## 2023-12-02 NOTE — Telephone Encounter (Signed)
 Is it just for the unexplained weight loss?

## 2023-12-02 NOTE — Telephone Encounter (Signed)
 Copied from CRM [id number]. Topic: Clinical - Medical Advice >> Dec 02, 2023  2:03 PM Delon HERO wrote: Reason for CRM: Patient is calling to ask why is [name] sending him for  CT abdomen pelvis scan?

## 2023-12-06 NOTE — Telephone Encounter (Signed)
 Called and left detailed message for pt on why CT is recommended

## 2023-12-08 ENCOUNTER — Other Ambulatory Visit: Payer: Self-pay | Admitting: Internal Medicine

## 2023-12-08 MED ORDER — TESTOSTERONE 25 MG/2.5GM (1%) TD GEL: 2.0000 | Freq: Every day | TRANSDERMAL | 2 refills | Status: DC

## 2023-12-08 NOTE — Telephone Encounter (Signed)
 [facility] in [facility]. I have pended order  Copied from CRM #[id number]. Topic: Clinical - Medication Question >> Dec 08, 2023 12:36 PM Santiya F wrote: Reason for CRM: Patient is calling in because he was offered a Gel from his provider and he said he wanted it, but hasn't heard anything from the office or pharmacy. Patient is requesting it be sent to [facility].

## 2023-12-12 ENCOUNTER — Other Ambulatory Visit: Payer: Self-pay | Admitting: Medical

## 2023-12-12 MED ORDER — TESTOSTERONE 25 MG/2.5GM (1%) TD GEL: 2.0000 | Freq: Every day | TRANSDERMAL | 2 refills | Status: DC

## 2023-12-12 NOTE — Telephone Encounter (Signed)
 Copied from CRM #[id number]. Topic: Clinical - Medication Refill >> Dec 12, 2023  1:25 PM Charlet HERO wrote: Medication: Testosterone  25 MG/2.5GM (1%) GEL  Has the patient contacted their pharmacy? Yes Told woul need to call the office  This is the patient's preferred pharmacy:  [id number] [place], [place] - [address] AT [facility] [address] & [facility] [address] [place] [address] Phone: [phone] Fax: [phone]  [id number] [place], [place] - [address] AT [facility] OF [facility] [address] & [facility] [address] [place] [address] Phone: [phone] Fax: [phone]  Is this the correct pharmacy for this prescription? Yes If no, delete pharmacy and type the correct one.   Has the prescription been filled recently? Yes  Is the patient out of the medication? Yes  Has the patient been seen for an appointment in the last year OR does the patient have an upcoming appointment? Yes  Can we respond through MyChart? Yes  Agent: Please be advised that Rx refills may take up to 3 business days. We ask that you follow-up with your pharmacy.

## 2023-12-14 ENCOUNTER — Telehealth: Payer: Self-pay | Admitting: Internal Medicine

## 2023-12-14 NOTE — Telephone Encounter (Unsigned)
 Patient called and would like an rx sent in to [facility] on [address] for Voltaren  gel 3% to help with his shoulder pain   Copied from CRM #[id number]. Topic: General - Other >> Dec 14, 2023 12:50 PM Kevelyn M wrote: Reason for CRM: Patient calling back for Olie Dibert.    Call back #[phone]

## 2023-12-15 ENCOUNTER — Other Ambulatory Visit: Payer: Self-pay | Admitting: Medical

## 2023-12-15 MED ORDER — DICLOFENAC SODIUM 1 % EX GEL: 2.0000 g | Freq: Four times a day (QID) | CUTANEOUS | 2 refills | Status: DC

## 2023-12-15 NOTE — Telephone Encounter (Signed)
 Resent to [facility] as it was initially sent to wrong pharmacy

## 2023-12-15 NOTE — Telephone Encounter (Signed)
 Called and left message for pt to call me back to find out what he wanted to speak with me about.

## 2023-12-19 ENCOUNTER — Ambulatory Visit: Attending: Cardiology | Admitting: Cardiology

## 2023-12-19 NOTE — Telephone Encounter (Signed)
 Closing encounter d/t no response from patient.

## 2023-12-20 ENCOUNTER — Encounter: Payer: Self-pay | Admitting: Cardiology

## 2023-12-23 ENCOUNTER — Other Ambulatory Visit: Payer: Self-pay | Admitting: Medical

## 2023-12-23 NOTE — Telephone Encounter (Signed)
 Copied from CRM [id number]. Topic: Clinical - Medication Refill >> Dec 23, 2023 12:18 PM Kevelyn M wrote: Medication: amphetamine -dextroamphetamine  (ADDERALL) 10 MG tablet, diclofenac  Sodium (VOLTAREN  ARTHRITIS PAIN) 3 % GEL  Has the patient contacted their pharmacy? No (Agent: If no, request that the patient contact the pharmacy for the refill. If patient does not wish to contact the pharmacy document the reason why and proceed with request.) (Agent: If yes, when and what did the pharmacy advise?)  This is the patient's preferred pharmacy:  [id number] [address], [place] - [address] AT [facility] OF [address] & [facility] [address] [place] [address] Phone: [phone] Fax: [phone]   Is this the correct pharmacy for this prescription? Yes If no, delete pharmacy and type the correct one.   Has the prescription been filled recently? No  Is the patient out of the medication? No  Has the patient been seen for an appointment in the last year OR does the patient have an upcoming appointment? Yes  Can we respond through MyChart? Yes  Agent: Please be advised that Rx refills may take up to 3 business days. We ask that you follow-up with your pharmacy.

## 2023-12-24 ENCOUNTER — Other Ambulatory Visit: Payer: Self-pay | Admitting: Family Medicine

## 2023-12-26 MED ORDER — DICLOFENAC SODIUM 1 % EX GEL: 2.0000 g | Freq: Four times a day (QID) | CUTANEOUS | 2 refills | Status: DC

## 2024-01-01 ENCOUNTER — Other Ambulatory Visit: Payer: Self-pay | Admitting: Medical

## 2024-01-28 ENCOUNTER — Emergency Department (HOSPITAL_COMMUNITY)

## 2024-01-28 ENCOUNTER — Other Ambulatory Visit: Payer: Self-pay

## 2024-01-28 ENCOUNTER — Encounter (HOSPITAL_COMMUNITY): Payer: Self-pay

## 2024-01-28 ENCOUNTER — Inpatient Hospital Stay (HOSPITAL_COMMUNITY)
Admission: EM | Admit: 2024-01-28 | Discharge: 2024-02-05 | DRG: 917 | Disposition: A | Discharge status: Home / Self Care | Attending: Internal Medicine | Admitting: Internal Medicine

## 2024-01-28 DIAGNOSIS — E119 Type 2 diabetes mellitus without complications: Secondary | ICD-10-CM | POA: Diagnosis present

## 2024-01-28 DIAGNOSIS — I4891 Unspecified atrial fibrillation: Secondary | ICD-10-CM | POA: Diagnosis not present

## 2024-01-28 DIAGNOSIS — G9341 Metabolic encephalopathy: Secondary | ICD-10-CM | POA: Diagnosis present

## 2024-01-28 DIAGNOSIS — Z6823 Body mass index (BMI) 23.0-23.9, adult: Secondary | ICD-10-CM

## 2024-01-28 DIAGNOSIS — Z791 Long term (current) use of non-steroidal anti-inflammatories (NSAID): Secondary | ICD-10-CM

## 2024-01-28 DIAGNOSIS — K219 Gastro-esophageal reflux disease without esophagitis: Secondary | ICD-10-CM | POA: Diagnosis present

## 2024-01-28 DIAGNOSIS — D509 Iron deficiency anemia, unspecified: Secondary | ICD-10-CM | POA: Diagnosis present

## 2024-01-28 DIAGNOSIS — Z8249 Family history of ischemic heart disease and other diseases of the circulatory system: Secondary | ICD-10-CM

## 2024-01-28 DIAGNOSIS — I10 Essential (primary) hypertension: Secondary | ICD-10-CM | POA: Diagnosis present

## 2024-01-28 DIAGNOSIS — F1010 Alcohol abuse, uncomplicated: Secondary | ICD-10-CM | POA: Diagnosis present

## 2024-01-28 DIAGNOSIS — Z8701 Personal history of pneumonia (recurrent): Secondary | ICD-10-CM

## 2024-01-28 DIAGNOSIS — H9190 Unspecified hearing loss, unspecified ear: Secondary | ICD-10-CM | POA: Diagnosis present

## 2024-01-28 DIAGNOSIS — E46 Unspecified protein-calorie malnutrition: Secondary | ICD-10-CM | POA: Diagnosis present

## 2024-01-28 DIAGNOSIS — T474X1A Poisoning by other laxatives, accidental (unintentional), initial encounter: Secondary | ICD-10-CM | POA: Diagnosis present

## 2024-01-28 DIAGNOSIS — I471 Supraventricular tachycardia, unspecified: Secondary | ICD-10-CM | POA: Diagnosis not present

## 2024-01-28 DIAGNOSIS — K5909 Other constipation: Secondary | ICD-10-CM | POA: Diagnosis present

## 2024-01-28 DIAGNOSIS — R2990 Unspecified symptoms and signs involving the nervous system: Secondary | ICD-10-CM

## 2024-01-28 DIAGNOSIS — E871 Hypo-osmolality and hyponatremia: Secondary | ICD-10-CM | POA: Diagnosis present

## 2024-01-28 DIAGNOSIS — R6520 Severe sepsis without septic shock: Secondary | ICD-10-CM

## 2024-01-28 DIAGNOSIS — I671 Cerebral aneurysm, nonruptured: Secondary | ICD-10-CM | POA: Diagnosis present

## 2024-01-28 DIAGNOSIS — I480 Paroxysmal atrial fibrillation: Secondary | ICD-10-CM | POA: Diagnosis not present

## 2024-01-28 DIAGNOSIS — I4892 Unspecified atrial flutter: Secondary | ICD-10-CM | POA: Diagnosis not present

## 2024-01-28 DIAGNOSIS — Z1152 Encounter for screening for COVID-19: Secondary | ICD-10-CM | POA: Diagnosis not present

## 2024-01-28 DIAGNOSIS — R4182 Altered mental status, unspecified: Principal | ICD-10-CM

## 2024-01-28 DIAGNOSIS — J691 Pneumonitis due to inhalation of oils and essences: Secondary | ICD-10-CM | POA: Diagnosis present

## 2024-01-28 DIAGNOSIS — M1A9XX0 Chronic gout, unspecified, without tophus (tophi): Secondary | ICD-10-CM | POA: Diagnosis present

## 2024-01-28 DIAGNOSIS — J189 Pneumonia, unspecified organism: Secondary | ICD-10-CM

## 2024-01-28 DIAGNOSIS — G8929 Other chronic pain: Secondary | ICD-10-CM | POA: Diagnosis present

## 2024-01-28 DIAGNOSIS — A419 Sepsis, unspecified organism: Principal | ICD-10-CM | POA: Diagnosis present

## 2024-01-28 DIAGNOSIS — R5381 Other malaise: Secondary | ICD-10-CM | POA: Diagnosis present

## 2024-01-28 DIAGNOSIS — Z79899 Other long term (current) drug therapy: Secondary | ICD-10-CM

## 2024-01-28 DIAGNOSIS — Z833 Family history of diabetes mellitus: Secondary | ICD-10-CM

## 2024-01-28 DIAGNOSIS — E785 Hyperlipidemia, unspecified: Secondary | ICD-10-CM | POA: Diagnosis present

## 2024-01-28 DIAGNOSIS — E440 Moderate protein-calorie malnutrition: Secondary | ICD-10-CM | POA: Insufficient documentation

## 2024-01-28 DIAGNOSIS — R509 Fever, unspecified: Secondary | ICD-10-CM

## 2024-01-28 DIAGNOSIS — R6510 Systemic inflammatory response syndrome (SIRS) of non-infectious origin without acute organ dysfunction: Secondary | ICD-10-CM

## 2024-01-28 HISTORY — DX: Hyperlipidemia, unspecified: E78.5

## 2024-01-28 LAB — URINALYSIS, W/ REFLEX TO CULTURE (INFECTION SUSPECTED)
Bacteria, UA: NONE SEEN
Bilirubin Urine: NEGATIVE
Glucose, UA: NEGATIVE mg/dL
Ketones, ur: NEGATIVE mg/dL
Leukocytes,Ua: NEGATIVE
Nitrite: NEGATIVE
Protein, ur: 100 mg/dL — AB
Specific Gravity, Urine: 1.025 (ref 1.005–1.030)
pH: 5 (ref 5.0–8.0)

## 2024-01-28 LAB — I-STAT VENOUS BLOOD GAS, ED
Acid-Base Excess: 0 mmol/L (ref 0.0–2.0)
Bicarbonate: 24.4 mmol/L (ref 20.0–28.0)
Calcium, Ion: 1.18 mmol/L (ref 1.15–1.40)
HCT: 36 % — ABNORMAL LOW (ref 39.0–52.0)
Hemoglobin: 12.2 g/dL — ABNORMAL LOW (ref 13.0–17.0)
O2 Saturation: 97 %
Potassium: 4.6 mmol/L (ref 3.5–5.1)
Sodium: 130 mmol/L — ABNORMAL LOW (ref 135–145)
TCO2: 26 mmol/L (ref 22–32)
pCO2, Ven: 38.6 mmHg — ABNORMAL LOW (ref 44–60)
pH, Ven: 7.408 (ref 7.25–7.43)
pO2, Ven: 94 mmHg — ABNORMAL HIGH (ref 32–45)

## 2024-01-28 LAB — CBC WITH DIFFERENTIAL/PLATELET
Abs Immature Granulocytes: 0.05 K/uL (ref 0.00–0.07)
Basophils Absolute: 0 K/uL (ref 0.0–0.1)
Basophils Relative: 0 %
Eosinophils Absolute: 0 K/uL (ref 0.0–0.5)
Eosinophils Relative: 0 %
HCT: 34.4 % — ABNORMAL LOW (ref 39.0–52.0)
Hemoglobin: 11.4 g/dL — ABNORMAL LOW (ref 13.0–17.0)
Immature Granulocytes: 0 %
Lymphocytes Relative: 4 %
Lymphs Abs: 0.6 K/uL — ABNORMAL LOW (ref 0.7–4.0)
MCH: 26.5 pg (ref 26.0–34.0)
MCHC: 33.1 g/dL (ref 30.0–36.0)
MCV: 79.8 fL — ABNORMAL LOW (ref 80.0–100.0)
Monocytes Absolute: 0.5 K/uL (ref 0.1–1.0)
Monocytes Relative: 4 %
Neutro Abs: 12 K/uL — ABNORMAL HIGH (ref 1.7–7.7)
Neutrophils Relative %: 92 %
Platelets: 218 K/uL (ref 150–400)
RBC: 4.31 MIL/uL (ref 4.22–5.81)
RDW: 14 % (ref 11.5–15.5)
WBC: 13.1 K/uL — ABNORMAL HIGH (ref 4.0–10.5)
nRBC: 0 % (ref 0.0–0.2)

## 2024-01-28 LAB — COMPREHENSIVE METABOLIC PANEL WITH GFR
ALT: 10 U/L (ref 0–44)
AST: 17 U/L (ref 15–41)
Albumin: 3.8 g/dL (ref 3.5–5.0)
Alkaline Phosphatase: 70 U/L (ref 38–126)
Anion gap: 12 (ref 5–15)
BUN: 23 mg/dL (ref 8–23)
CO2: 24 mmol/L (ref 22–32)
Calcium: 9.4 mg/dL (ref 8.9–10.3)
Chloride: 94 mmol/L — ABNORMAL LOW (ref 98–111)
Creatinine, Ser: 1.47 mg/dL — ABNORMAL HIGH (ref 0.61–1.24)
GFR, Estimated: 50 mL/min — ABNORMAL LOW (ref >60)
Glucose, Bld: 121 mg/dL — ABNORMAL HIGH (ref 70–99)
Potassium: 4.6 mmol/L (ref 3.5–5.1)
Sodium: 130 mmol/L — ABNORMAL LOW (ref 135–145)
Total Bilirubin: 1 mg/dL (ref 0.0–1.2)
Total Protein: 7.9 g/dL (ref 6.5–8.1)

## 2024-01-28 LAB — RESPIRATORY PANEL BY PCR

## 2024-01-28 LAB — PROTIME-INR
INR: 1.2 (ref 0.8–1.2)
Prothrombin Time: 15.5 s — ABNORMAL HIGH (ref 11.4–15.2)

## 2024-01-28 LAB — CBG MONITORING, ED: Glucose-Capillary: 134 mg/dL — ABNORMAL HIGH (ref 70–99)

## 2024-01-28 LAB — RESP PANEL BY RT-PCR (RSV, FLU A&B, COVID)  RVPGX2
Influenza A by PCR: NEGATIVE
Influenza B by PCR: NEGATIVE
Resp Syncytial Virus by PCR: NEGATIVE
SARS Coronavirus 2 by RT PCR: NEGATIVE

## 2024-01-28 LAB — ETHANOL: Alcohol, Ethyl (B): <15 mg/dL (ref <15)

## 2024-01-28 LAB — I-STAT CG4 LACTIC ACID, ED: Lactic Acid, Venous: 1 mmol/L (ref 0.5–1.9)

## 2024-01-28 LAB — AMMONIA: Ammonia: 23 umol/L (ref 9–35)

## 2024-01-28 MED ORDER — VANCOMYCIN HCL IN DEXTROSE 1-5 GM/200ML-% IV SOLN
1000.0000 mg | Freq: Once | INTRAVENOUS | Status: AC
  Administered 2024-01-28: 1000 mg via INTRAVENOUS
  Filled 2024-01-28: qty 200

## 2024-01-28 MED ORDER — LACTATED RINGERS IV BOLUS
1000.0000 mL | Freq: Once | INTRAVENOUS | Status: AC
  Administered 2024-01-28: 1000 mL via INTRAVENOUS

## 2024-01-28 MED ORDER — VANCOMYCIN HCL IN DEXTROSE 1-5 GM/200ML-% IV SOLN
1000.0000 mg | INTRAVENOUS | Status: DC
  Administered 2024-01-29 – 2024-01-31 (×3): 1000 mg via INTRAVENOUS
  Filled 2024-01-28 (×3): qty 200

## 2024-01-28 MED ORDER — ENOXAPARIN SODIUM 40 MG/0.4ML IJ SOSY
40.0000 mg | PREFILLED_SYRINGE | Freq: Every day | INTRAMUSCULAR | Status: DC
  Administered 2024-01-29 – 2024-02-05 (×8): 40 mg via SUBCUTANEOUS
  Filled 2024-01-28 (×8): qty 0.4

## 2024-01-28 MED ORDER — ACETAMINOPHEN 500 MG PO TABS: 1000.0000 mg | ORAL_TABLET | Freq: Once | ORAL | Status: DC

## 2024-01-28 MED ORDER — SODIUM CHLORIDE 0.9 % IV SOLN
500.0000 mg | INTRAVENOUS | Status: AC
  Administered 2024-01-29 – 2024-02-02 (×5): 500 mg via INTRAVENOUS
  Filled 2024-01-28 (×5): qty 5

## 2024-01-28 MED ORDER — ONDANSETRON HCL 4 MG/2ML IJ SOLN
4.0000 mg | Freq: Four times a day (QID) | INTRAMUSCULAR | Status: DC | PRN
  Administered 2024-02-03: 4 mg via INTRAVENOUS
  Filled 2024-01-28: qty 2

## 2024-01-28 MED ORDER — SODIUM CHLORIDE 0.9 % IV SOLN: 1.0000 g | Freq: Once | INTRAVENOUS | Status: DC

## 2024-01-28 MED ORDER — ROSUVASTATIN CALCIUM 5 MG PO TABS
10.0000 mg | ORAL_TABLET | Freq: Every day | ORAL | Status: DC
  Administered 2024-01-29 – 2024-02-05 (×8): 10 mg via ORAL
  Filled 2024-01-28 (×8): qty 2

## 2024-01-28 MED ORDER — ACETAMINOPHEN 325 MG PO TABS
650.0000 mg | ORAL_TABLET | Freq: Once | ORAL | Status: AC
  Administered 2024-01-28: 650 mg via ORAL
  Filled 2024-01-28: qty 2

## 2024-01-28 MED ORDER — AMPHETAMINE-DEXTROAMPHETAMINE 10 MG PO TABS
10.0000 mg | ORAL_TABLET | Freq: Every day | ORAL | Status: DC
  Administered 2024-01-29 – 2024-01-31 (×3): 10 mg via ORAL
  Filled 2024-01-28 (×3): qty 1

## 2024-01-28 MED ORDER — LORAZEPAM 1 MG PO TABS
0.5000 mg | ORAL_TABLET | ORAL | Status: AC | PRN
  Administered 2024-01-28: 0.5 mg via ORAL
  Filled 2024-01-28: qty 1

## 2024-01-28 MED ORDER — OXYCODONE HCL 5 MG PO TABS
10.0000 mg | ORAL_TABLET | Freq: Four times a day (QID) | ORAL | Status: DC | PRN
  Administered 2024-01-28 – 2024-02-05 (×24): 10 mg via ORAL
  Filled 2024-01-28 (×24): qty 2

## 2024-01-28 MED ORDER — IBUPROFEN 400 MG PO TABS
600.0000 mg | ORAL_TABLET | Freq: Once | ORAL | Status: AC
  Administered 2024-01-28: 600 mg via ORAL
  Filled 2024-01-28: qty 1

## 2024-01-28 MED ORDER — CEFTRIAXONE SODIUM 2 G IJ SOLR
2.0000 g | INTRAMUSCULAR | Status: AC
  Administered 2024-01-29 – 2024-02-03 (×6): 2 g via INTRAVENOUS
  Filled 2024-01-28 (×5): qty 20

## 2024-01-28 MED ORDER — GADOBUTROL 1 MMOL/ML IV SOLN
7.0000 mL | Freq: Once | INTRAVENOUS | Status: AC | PRN
  Administered 2024-01-28: 7 mL via INTRAVENOUS

## 2024-01-28 MED ORDER — SODIUM CHLORIDE 0.9 % IV SOLN
2.0000 g | Freq: Once | INTRAVENOUS | Status: AC
  Administered 2024-01-28: 2 g via INTRAVENOUS
  Filled 2024-01-28: qty 20

## 2024-01-28 MED ORDER — ONDANSETRON HCL 4 MG/2ML IJ SOLN
4.0000 mg | Freq: Once | INTRAMUSCULAR | Status: AC
  Administered 2024-01-28: 4 mg via INTRAVENOUS
  Filled 2024-01-28: qty 2

## 2024-01-28 MED ORDER — ACETAMINOPHEN 325 MG PO TABS
650.0000 mg | ORAL_TABLET | Freq: Four times a day (QID) | ORAL | Status: DC | PRN
  Administered 2024-01-29 – 2024-02-04 (×15): 650 mg via ORAL
  Filled 2024-01-28 (×17): qty 2

## 2024-01-28 MED ORDER — IOHEXOL 350 MG/ML SOLN
75.0000 mL | Freq: Once | INTRAVENOUS | Status: AC | PRN
  Administered 2024-01-28: 75 mL via INTRAVENOUS

## 2024-01-28 MED ORDER — SODIUM CHLORIDE 0.9 % IV SOLN
500.0000 mg | Freq: Once | INTRAVENOUS | Status: AC
  Administered 2024-01-28: 500 mg via INTRAVENOUS
  Filled 2024-01-28: qty 5

## 2024-01-28 MED ORDER — ACETAMINOPHEN 650 MG RE SUPP
650.0000 mg | Freq: Four times a day (QID) | RECTAL | Status: DC | PRN
Start: 2024-01-28 — End: 2024-02-05

## 2024-01-28 MED ORDER — ONDANSETRON HCL 4 MG PO TABS: 4.0000 mg | ORAL_TABLET | Freq: Four times a day (QID) | ORAL | Status: DC | PRN

## 2024-01-28 MED ORDER — SODIUM CHLORIDE 0.9 % IV SOLN: INTRAVENOUS | Status: AC

## 2024-01-28 MED ORDER — METOPROLOL TARTRATE 100 MG PO TABS
100.0000 mg | ORAL_TABLET | Freq: Every day | ORAL | Status: DC
  Administered 2024-01-29 – 2024-02-01 (×3): 100 mg via ORAL
  Filled 2024-01-28: qty 1
  Filled 2024-01-28: qty 4
  Filled 2024-01-28 (×2): qty 1

## 2024-01-28 MED ORDER — ALLOPURINOL 100 MG PO TABS
100.0000 mg | ORAL_TABLET | Freq: Every day | ORAL | Status: DC
  Administered 2024-01-29 – 2024-02-05 (×8): 100 mg via ORAL
  Filled 2024-01-28 (×8): qty 1

## 2024-01-28 MED ORDER — POLYETHYLENE GLYCOL 3350 17 G PO PACK
17.0000 g | PACK | Freq: Every day | ORAL | Status: DC | PRN
Start: 2024-01-28 — End: 2024-02-05
  Administered 2024-02-02 – 2024-02-04 (×2): 17 g via ORAL
  Filled 2024-01-28 (×2): qty 1

## 2024-01-28 MED ORDER — ALBUTEROL SULFATE (2.5 MG/3ML) 0.083% IN NEBU: 2.5000 mg | INHALATION_SOLUTION | Freq: Four times a day (QID) | RESPIRATORY_TRACT | Status: DC | PRN

## 2024-01-28 NOTE — ED Provider Notes (Signed)
 [place] EMERGENCY DEPARTMENT AT [facility] Provider Note   CSN: [id number] Arrival date & time: 01/28/24  1517     Patient presents with: Altered Mental Status   Logan Phillips is a 73 y.o. male.  {Add pertinent medical, surgical, social history, OB history to YEP:67052}  Altered Mental Status      Prior to Admission medications   Medication Sig Start Date End Date Taking? Authorizing Provider  acetaminophen  (TYLENOL ) 500 MG tablet Take 1,000 mg by mouth every 6 (six) hours as needed.    [provider]  albuterol  (PROVENTIL ) (2.5 MG/3ML) 0.083% nebulizer solution Take 3 mLs (2.5 mg total) by nebulization every 6 (six) hours as needed for wheezing or shortness of breath. 09/28/23   Meade Verdon RAMAN, MD  albuterol  (VENTOLIN  HFA) [phone] Base) MCG/ACT inhaler Inhale 2 puffs into the lungs every 6 (six) hours as needed for wheezing or shortness of breath. 10/06/23   Meade Verdon RAMAN, MD  allopurinol (ZYLOPRIM) 100 MG tablet TAKE 1 TABLET BY MOUTH EVERY DAY 01/02/24   Tysinger, Alm RAMAN, PA-C  amphetamine -dextroamphetamine  (ADDERALL) 10 MG tablet Take 1 tablet (10 mg total) by mouth daily with breakfast. 11/18/23   Tysinger, Alm RAMAN, PA-C  diclofenac  Sodium (VOLTAREN  ARTHRITIS PAIN) 1 % GEL Apply 2 g topically 4 (four) times daily. 12/26/23   Tysinger, Alm RAMAN, PA-C  docusate sodium  (COLACE) 100 MG capsule Take 1 capsule (100 mg total) by mouth 2 (two) times daily. 10/27/23   Tysinger, Alm RAMAN, PA-C  lisinopril  (ZESTRIL ) 2.5 MG tablet TAKE 1 TABLET(2.5 MG) BY MOUTH IN THE MORNING AND AT BEDTIME 12/26/23   Joyce Norleen BROCKS, MD  meloxicam  (MOBIC ) 15 MG tablet TAKE 1 TABLET(15 MG) BY MOUTH DAILY 10/31/23   Tysinger, Alm RAMAN, PA-C  Multiple Vitamin (MULITIVITAMIN WITH MINERALS) TABS Take 1 tablet by mouth daily.    [provider]  Oxycodone  HCl 10 MG TABS Take 10 mg by mouth 5 (five) times daily.    [provider]  rosuvastatin  (CRESTOR ) 10 MG tablet Take 1  tablet (10 mg total) by mouth daily. 04/08/23 11/21/23  Alvan Ronal BRAVO, MD  Testosterone  25 MG/2.5GM (1%) GEL Apply 2 Pump topically daily. 12/12/23   Tysinger, Alm RAMAN, PA-C    Allergies: Patient has no known allergies.    Review of Systems  Updated Vital Signs BP (!) 165/121 (BP Location: Left Arm)   Pulse (!) 126   Temp (!) 102 F (38.9 C) (Oral)   Resp (!) 24   Ht 5' 7 (1.702 m)   Wt 68 kg   SpO2 99%   BMI 23.49 kg/m   Physical Exam  (all labs ordered are listed, but only abnormal results are displayed) Labs Reviewed  CBG MONITORING, ED - Abnormal; Notable for the following components:      Result Value   Glucose-Capillary 134 (*)    All other components within normal limits  CULTURE, BLOOD (ROUTINE X 2)  CULTURE, BLOOD (ROUTINE X 2)  RESP PANEL BY RT-PCR (RSV, FLU A&B, COVID)  RVPGX2  COMPREHENSIVE METABOLIC PANEL WITH GFR  CBC WITH DIFFERENTIAL/PLATELET  PROTIME-INR  URINALYSIS, W/ REFLEX TO CULTURE (INFECTION SUSPECTED)  I-STAT CG4 LACTIC ACID, ED    EKG: None  Radiology: No results found.  {Document cardiac monitor, telemetry assessment procedure when appropriate:32947} Procedures   Medications Ordered in the ED  acetaminophen  (TYLENOL ) tablet 1,000 mg (has no administration in time range)      {Click here for  ABCD2, HEART and other calculators REFRESH Note before signing:1}                              Medical Decision Making Amount and/or Complexity of Data Reviewed Labs: ordered. Radiology: ordered.  Risk OTC drugs.   ***  {Document critical care time when appropriate  Document review of labs and clinical decision tools ie CHADS2VASC2, etc  Document your independent review of radiology images and any outside records  Document your discussion with family members, caretakers and with consultants  Document social determinants of health affecting pt's care  Document your decision making why or why not admission, treatments were  needed:32947:::1}   Final diagnoses:  None    ED Discharge Orders     None

## 2024-01-28 NOTE — ED Notes (Signed)
 Pt request pain med for 9/10 back pain, pt states he takes hydrocodone  daily and feels he's withdrawing.MD notified.

## 2024-01-28 NOTE — H&P (Signed)
 History and Physical    Logan Phillips FMW:[id number] DOB: [birth year] DOA: 01/28/2024  PCP: Bulah Alm RAMAN, PA-C   Patient coming from: Home  I have personally briefly reviewed patient's old medical records in [facility]  Chief Complaint: AMS  HPI: Logan Phillips is a 73 y.o. male with medical history significant for necrotizing pneumonia, hypertension, diabetes mellitus, gout, alcohol abuse. Patient was brought to the ED for reports of altered mental status.  On my evaluation, patient is awake, slightly lethargic, a bit hard of hearing but able to answer questions appropriately.  Patient's ex wife-Allison is at bedside.  Patient was playing golf with his friends who called EMS because of confusion.  Ex-wife reports that patient went to the parking lot and was opening and closing multiple doors randomly, he did not recognize his daughter.  He has had a productive cough over the past year, did not think it has worsened in severity, no difficulty breathing.  Reports patient woke up this morning saying he did not feel well and never wanted to play golf again which is unusual for him to say.  Reports he has felt unwell for a long time with associated weight loss.  He denies vomiting, no diarrhea, no abdominal pain.  No urinary symptoms.  Reports some headaches over the past 2 weeks for which he takes Tylenol .  No neck pain, no stiffness. Since being in the ED, mental status has improved.  ED Course: Febrile to 102.4.  Heart rate 108-126.  Respirate rate 18-27.  Blood pressure systolic 128-190.  O2 sats  99% on room air.  WBC 13.1.  Lactic acid 1.  VBG shows pH of 7.4, pCO2 of 30.6. COVID influenza RSV negative. Head CT-cortical hypodensity inferior right occipital lobe-could reflect age indeterminate infarct. CTA head and neck-approximately 3 to 4 mm basilar tip aneurysm. CT chest abdomen and pelvis with contrast-low-attenuation consolidation within the bilateral lungs with superimposed  area of cavitation, compatible with known history of lipoid pneumonia, slight progression since prior study.] 2 L bolus given. IV Ceftriaxone and azithromycin  started. EDP talked to Neurology, doubt acute infarct, MRI brain ordered.  Review of Systems: As per HPI all other systems reviewed and negative.  Past Medical History:  Diagnosis Date   Chronic pain in shoulder 2018   [facility]   H/O echocardiogram 10/08/2016   EF 65-70%, normal wall motion, systolic function vigorous   Hearing loss    pending hearing aids 06/2014   Hypertension    Lipoma    Pneumonia    Varicose vein    Vestibular schwannoma (HCC) 04/2018    Past Surgical History:  Procedure Laterality Date   APPENDECTOMY     APPLICATION OF CRANIAL NAVIGATION Right 05/09/2018   Procedure: APPLICATION OF CRANIAL NAVIGATION;  Surgeon: Lanis Pupa, MD;  Location: [facility] OR;  Service: Neurosurgery;  Laterality: Right;   BIOPSY  04/12/2018   Procedure: BIOPSY;  Surgeon: Kristie Lamprey, MD;  Location: [facility] ENDOSCOPY;  Service: Endoscopy;;   COLONOSCOPY     age 53, he did not f/u with referral 2018   COLONOSCOPY WITH PROPOFOL  N/A 04/12/2018   Procedure: COLONOSCOPY WITH PROPOFOL ;  Surgeon: Kristie Lamprey, MD;  Location[facility] END[facility]COPY;  Service: Endoscopy;  Laterality: N/A;   CRANIOTOMY Right 05/09/2018   Procedure: Right retrosigmoid craniectomy, resection of acoustic neuroma with intraoperative facial monitoring/Brain Lab;  Surgeon: Lanis Pupa, MD;  Location: [facility] OR;  Service: Neurosurgery;  Laterality: Right;   LUMBAR DISC SURGERY  2002  Dr. Mora   VIDEO BRONCHOSCOPY N/A 09/06/2023   Procedure: BRONCHOSCOPY, WITH FLUOROSCOPY;  Surgeon: Meade Verdon RAMAN, MD;  Location: [facility];  Service: Pulmonary;  Laterality: N/A;   WISDOM TOOTH EXTRACTION       reports that he has never smoked. He has been exposed to tobacco smoke. He has never used smokeless tobacco. He reports current alcohol use of about 7.0  standard drinks of alcohol per week. He reports that he does not use drugs.  No Known Allergies  Family History  Problem Relation Age of Onset   Hypertension Mother    Dementia Father    Parkinsonism Father    Diabetes Father        early stages   Cancer Maternal Grandmother        breast   Heart disease Neg Hx    Stroke Neg Hx     Prior to Admission medications   Medication Sig Start Date End Date Taking? Authorizing Provider  acetaminophen  (TYLENOL ) 500 MG tablet Take 1,000 mg by mouth every 6 (six) hours as needed.   Yes [provider]  albuterol  (PROVENTIL ) (2.5 MG/3ML) 0.083% nebulizer solution Take 3 mLs (2.5 mg total) by nebulization every 6 (six) hours as needed for wheezing or shortness of breath. 09/28/23  Yes Meade Verdon RAMAN, MD  albuterol  (VENTOLIN  HFA) 108 [phone] Base) MCG/ACT inhaler Inhale 2 puffs into the lungs every 6 (six) hours as needed for wheezing or shortness of breath. 10/06/23  Yes Meade Verdon RAMAN, MD  allopurinol (ZYLOPRIM) 100 MG tablet TAKE 1 TABLET BY MOUTH EVERY DAY 01/02/24  Yes Tysinger, Alm RAMAN, PA-C  amphetamine -dextroamphetamine  (ADDERALL) 10 MG tablet Take 1 tablet (10 mg total) by mouth daily with breakfast. 11/18/23  Yes Tysinger, Alm RAMAN, PA-C  lisinopril  (ZESTRIL ) 2.5 MG tablet TAKE 1 TABLET(2.5 MG) BY MOUTH IN THE MORNING AND AT BEDTIME 12/26/23  Yes Joyce Norleen BROCKS, MD  meloxicam  (MOBIC ) 15 MG tablet TAKE 1 TABLET(15 MG) BY MOUTH DAILY 10/31/23  Yes Tysinger, Alm RAMAN, PA-C  metoprolol  tartrate (LOPRESSOR ) 100 MG tablet 100 mg daily. 05/20/22  Yes [provider]  Oxycodone  HCl 10 MG TABS Take 10 mg by mouth 5 (five) times daily.   Yes [provider]  rosuvastatin  (CRESTOR ) 10 MG tablet Take 1 tablet (10 mg total) by mouth daily. 04/08/23 01/28/24 Yes BranchRonal BRAVO, MD  Testosterone  25 MG/2.5GM (1%) GEL Apply 2 Pump topically daily. 12/12/23  Yes Tysinger, Alm RAMAN, PA-C  docusate sodium  (COLACE) 100 MG capsule Take 1 capsule  (100 mg total) by mouth 2 (two) times daily. Patient not taking: Reported on 01/28/2024 10/27/23   Tysinger, Alm RAMAN, PA-C  Multiple Vitamin (MULITIVITAMIN WITH MINERALS) TABS Take 1 tablet by mouth daily. Patient not taking: Reported on 01/28/2024    [provider]    Physical Exam: Vitals:   01/28/24 1924 01/28/24 1955 01/28/24 2052 01/28/24 2100  BP:  (!) 177/96  (!) 128/90  Pulse:  (!) 120  (!) 108  Resp:  18  (!) 27  Temp:   (!) 102.4 F (39.1 C)   TempSrc:   Oral   SpO2: 99% 100%  99%  Weight:      Height:        Constitutional: NAD, calm, comfortable Vitals:   01/28/24 1924 01/28/24 1955 01/28/24 2052 01/28/24 2100  BP:  (!) 177/96  (!) 128/90  Pulse:  (!) 120  (!) 108  Resp:  18  (!) 27  Temp:   ROLLEN)  102.4 F (39.1 C)   TempSrc:   Oral   SpO2: 99% 100%  99%  Weight:      Height:       Eyes: PERRL, lids and conjunctivae normal ENMT: Mucous membranes are moist.  Neck: normal, supple, no masses, no thyromegaly Respiratory: clear to auscultation bilaterally, no wheezing, no crackles. Normal respiratory effort. No accessory muscle use.  Cardiovascular: Regular rate and rhythm, no murmurs / rubs / gallops. No extremity edema.  Abdomen: no tenderness, no masses palpated. No hepatosplenomegaly. Bowel sounds positive.  Musculoskeletal: no clubbing / cyanosis. No joint deformity upper and lower extremities.  Skin: no rashes, lesions, ulcers. No induration Neurologic: No facial asymmetry, speech fluent, equal grip strength bilaterally, 4+/5 strength to bilateral lower extremities. Psychiatric: Awake but slightly lethargic, answering all questions appropriately, oriented x 4.  Recognizes family.  Labs on Admission: I have personally reviewed following labs and imaging studies  CBC: Recent Labs  Lab 01/28/24 1723 01/28/24 1747  WBC 13.1*  --   NEUTROABS 12.0*  --   HGB 11.4* 12.2*  HCT 34.4* 36.0*  MCV 79.8*  --   PLT 218  --    Basic Metabolic  Panel: Recent Labs  Lab 01/28/24 1723 01/28/24 1747  NA 130* 130*  K 4.6 4.6  CL 94*  --   CO2 24  --   GLUCOSE 121*  --   BUN 23  --   CREATININE 1.47*  --   CALCIUM  9.4  --    GFR: Estimated Creatinine Clearance: 42.5 mL/min (A) (by C-G formula based on SCr of 1.47 mg/dL (H)). Liver Function Tests: Recent Labs  Lab 01/28/24 1723  AST 17  ALT 10  ALKPHOS 70  BILITOT 1.0  PROT 7.9  ALBUMIN 3.8   No results for input(s): LIPASE, AMYLASE in the last 168 hours. Recent Labs  Lab 01/28/24 1723  AMMONIA 23   Coagulation Profile: Recent Labs  Lab 01/28/24 1723  INR 1.2   CBG: Recent Labs  Lab 01/28/24 1540  GLUCAP 134*   Urine analysis:    Component Value Date/Time   COLORURINE YELLOW 01/28/2024 1556   APPEARANCEUR HAZY (A) 01/28/2024 1556   LABSPEC 1.025 01/28/2024 1556   LABSPEC 1.020 10/05/2017 0956   PHURINE 5.0 01/28/2024 1556   GLUCOSEU NEGATIVE 01/28/2024 1556   HGBUR MODERATE (A) 01/28/2024 1556   BILIRUBINUR NEGATIVE 01/28/2024 1556   BILIRUBINUR negative 08/05/2022 0949   BILIRUBINUR NEG 07/03/2013 1015   KETONESUR NEGATIVE 01/28/2024 1556   PROTEINUR 100 (A) 01/28/2024 1556   UROBILINOGEN 0.2 08/05/2022 0949   NITRITE NEGATIVE 01/28/2024 1556   LEUKOCYTESUR NEGATIVE 01/28/2024 1556    Radiological Exams on Admission: CT ANGIO HEAD NECK W WO CM Result Date: 01/28/2024 EXAM: CTA HEAD AND NECK WITH AND WITHOUT 01/28/2024 07:23:00 PM TECHNIQUE: CTA of the head and neck was performed with and without the administration of intravenous contrast. Multiplanar 2D and/or 3D reformatted images are provided for review. Automated exposure control, iterative reconstruction, and/or weight based adjustment of the mA/kV was utilized to reduce the radiation dose to as low as reasonably achievable. Stenosis of the internal carotid arteries measured using NASCET criteria. COMPARISON: None available CLINICAL HISTORY: Neuro deficit, acute, stroke suspected  FINDINGS: AORTIC ARCH AND ARCH VESSELS: No dissection or arterial injury. No significant stenosis of the brachiocephalic or subclavian arteries. CERVICAL CAROTID ARTERIES: No dissection, arterial injury, or hemodynamically significant stenosis by NASCET criteria. CERVICAL VERTEBRAL ARTERIES: No dissection, arterial injury, or significant stenosis. LUNGS  AND MEDIASTINUM: Unremarkable. SOFT TISSUES: No acute abnormality. BONES: No acute abnormality. ANTERIOR CIRCULATION: No significant stenosis of the internal carotid arteries. No significant stenosis of the anterior cerebral arteries. No significant stenosis of the middle cerebral arteries. Approximately 3-4 mm basilar tip aneurysm. POSTERIOR CIRCULATION: No significant stenosis of the posterior cerebral arteries. No significant stenosis of the basilar artery. No significant stenosis of the vertebral arteries. No aneurysm. OTHER: No dural venous sinus thrombosis on this non-dedicated study. IMPRESSION: 1. Approximately 3-4 mm basilar tip aneurysm. 2. No large vessel occlusion or hemodynamically significant stenosis. Electronically signed by: Gilmore Molt MD 01/28/2024 07:46 PM EST RP Workstation: [id number]   CT CHEST ABDOMEN PELVIS W CONTRAST Result Date: 01/28/2024 CLINICAL DATA:  Sepsis, neurologic deficit EXAM: CT CHEST, ABDOMEN, AND PELVIS WITH CONTRAST TECHNIQUE: Multidetector CT imaging of the chest, abdomen and pelvis was performed following the standard protocol during bolus administration of intravenous contrast. RADIATION DOSE REDUCTION: This exam was performed according to the departmental dose-optimization program which includes automated exposure control, adjustment of the mA and/or kV according to patient size and/or use of iterative reconstruction technique. CONTRAST:  75mL OMNIPAQUE  IOHEXOL  350 MG/ML SOLN COMPARISON:  08/10/2023 FINDINGS: CT CHEST FINDINGS Cardiovascular: The heart is unremarkable without pericardial effusion. No evidence of  thoracic aortic aneurysm or dissection. Mediastinum/Nodes: Stable mediastinal lymphadenopathy, with largest lymph node in the pretracheal region measuring 12 mm in short axis. Thyroid , trachea, and esophagus are unremarkable. Lungs/Pleura: Bilateral areas of cavitating pneumonia are again identified, most pronounced in the left lower lobe. Overall, there has been mild progression since prior study. The areas of lung consolidation demonstrate low attenuation, compatible with findings of lipoid pneumonia noted on recent bronchoscopy 09/06/2023. Trace right pleural effusion. No pneumothorax. Musculoskeletal: No acute or destructive bony abnormalities. Reconstructed images demonstrate no additional findings. CT ABDOMEN PELVIS FINDINGS Hepatobiliary: No focal liver abnormality is seen. No gallstones, gallbladder wall thickening, or biliary dilatation. Pancreas: Unremarkable. No pancreatic ductal dilatation or surrounding inflammatory changes. Spleen: Normal in size without focal abnormality. Adrenals/Urinary Tract: Simple appearing right renal cortical cyst does not require specific imaging follow-up. Otherwise the kidneys are unremarkable with no urinary tract calculi or obstructive uropathy. The adrenals and bladder are grossly normal. Stomach/Bowel: No bowel obstruction or ileus. No bowel wall thickening or inflammatory change. Vascular/Lymphatic: Aortic atherosclerosis. No enlarged abdominal or pelvic lymph nodes. Reproductive: Prostate is unremarkable. Other: No free fluid or free intraperitoneal gas. No abdominal wall hernia. Musculoskeletal: No acute or destructive bony abnormalities. Reconstructed images demonstrate no additional findings. IMPRESSION: 1. Low-attenuation consolidation within the bilateral lungs with superimposed areas of cavitation, compatible with known history of the lipoid pneumonia. Slight progression since prior study. 2. Stable mediastinal lymphadenopathy. 3. Trace right pleural effusion. 4.  No acute intra-abdominal or intrapelvic process. 5.  Aortic Atherosclerosis (ICD10-I70.0). Electronically Signed   By: Ozell Daring M.D.   On: 01/28/2024 19:44   CT Head Wo Contrast Result Date: 01/28/2024 CLINICAL DATA:  Altered level of consciousness, confusion EXAM: CT HEAD WITHOUT CONTRAST TECHNIQUE: Contiguous axial images were obtained from the base of the skull through the vertex without intravenous contrast. RADIATION DOSE REDUCTION: This exam was performed according to the departmental dose-optimization program which includes automated exposure control, adjustment of the mA and/or kV according to patient size and/or use of iterative reconstruction technique. COMPARISON:  09/08/2018 FINDINGS: Brain: There is hypodensity within the inferior right occipital cortex, reference image 11/3 and image 12/5, consistent with age-indeterminate infarct. No evidence of acute hemorrhage. Lateral ventricles and  midline structures are unremarkable. No acute extra-axial fluid collections. No mass effect. Vascular: No hyperdense vessel or unexpected calcification. Skull: Postsurgical changes from prior right occipital craniectomy. The remainder of the calvarium is unremarkable. Sinuses/Orbits: Mild mucosal thickening within the left sphenoid sinus. Remaining paranasal sinuses are clear. Other: None. IMPRESSION: 1. Cortical hypodensity within the inferior right occipital lobe, which could reflect age indeterminate cortical infarct. Further evaluation with MRI may be useful. 2. No evidence of acute hemorrhage. 3. Postsurgical changes from prior right-sided acoustic neuroma resection. These results were called by telephone at the time of interpretation on 01/28/2024 at 5:03 pm to provider [facility] , who verbally acknowledged these results. Electronically Signed   By: Ozell Daring M.D.   On: 01/28/2024 17:21   DG Chest 2 View if patient is not in a treatment room. Result Date: 01/28/2024 CLINICAL DATA:  Possible  sepsis.  Confusion. EXAM: CHEST - 2 VIEW COMPARISON:  09/06/2023, 02/28/2023 and chest CT 08/02/2023 FINDINGS: Lungs are adequately inflated demonstrate multifocal airspace density over the left mid to lower lung and right base as similar findings are seen on previous exams. Patient underwent bronchoscopy June 2025 with biopsy demonstrating lipoid pneumonia. No significant effusion or pneumothorax. Cardiomediastinal silhouette and remainder of the exam is unchanged. IMPRESSION: Multifocal airspace process over the left mid to lower lung and right base as similar findings are seen on previous exams. Previous bronchoscopy with biopsy June 2025 demonstrates lipoid pneumonia. Electronically Signed   By: Toribio Agreste M.D.   On: 01/28/2024 16:33    EKG: Independently reviewed.  Sinus rhythm, rate 129, QTc 383.  Anterior lateral T wave inversions, V4-V6.  Assessment/Plan Principal Problem:   Severe sepsis [facility]) Active Problems:   Essential hypertension   Gastroesophageal reflux disease   Chronic gout without tophus  Assessment and Plan:  Severe sepsis-meeting criteria with fever of 102.4, tachycardia heart rate 108-106, tachypnea respirate rate 18-27, leukocytosis of 15.1.  With evidence of endorgan dysfunction- encephalopathy.  CTA chest-bilateral lung consolidation with superimposed cavitation, compatible with history of lipoid pneumonia, slight progression from prior.  UA not suggestive of UTI.  Neck supple.  - 2 L bolus given, continue N/s 100 cc/h X 20hrs - Continue IV ceftriaxone and azithromycin , add IV Vanco for cavitation on imaging -Follow-up blood cultures, urine cultures  Pneumonia- history of necrotizing pneumonia, lipoid pneumonia.  O2 sats 99% on room air.  He has had ongoing productive cough over the past 1 year, no dyspnea, chronically feeling unwell. - Plan per above - Urine Legionella, and strep antigen -  Consider pulmonology consult in a.m.  Hyponatremia-sodium 130. -  Hydrate  Acute metabolic encephalopathy -in the setting of severe sepsis.  Head CT-possible age-indeterminate cortical infarct-right occipital lobe, CTA head shows 2 to 4 mm basilar tip aneurysm.  Neck supple.  Mental status has improved.  Doubt meningitis/encephalitis at this time. - Follow-up cultures - MRI brain negative for acute abnormality.  Alcohol abuse- drinks 1-2 beers or vodka, about 4 times a week -drinks socially.  No history of seizures, denies history of withdrawals.  Calm, no sign of withdrawal at this time. 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 - Per my secure chat with neurologist Dr. Sharlynn follow-up with neurosurgery  Hypertension-systolic 120s to 809d - Resume metoprolol  100 mg daily - Hold lisinopril  2.5 mg daily for contrast exposure  Gout - Resume allopurinol  DVT  prophylaxis: Lovenox  Code Status: Full code Family Communication: Ex-wife Isaiah at bedside. Disposition Plan: > 2 days Consults called: May benefit from pulmonology consultation Admission status: Inpatient, telemetry I certify that at the point of admission it is my clinical judgment that the patient will require inpatient hospital care spanning beyond 2 midnights from the point of admission due to high intensity of service, high risk for further deterioration and high frequency of surveillance required.    Author: Tully FORBES Carwin, MD 01/28/2024 11:54 PM  For on call review [email or web address].

## 2024-01-28 NOTE — Progress Notes (Signed)
 Pharmacy Antibiotic Note  Logan Phillips is a 73 y.o. male admitted on 01/28/2024 with pneumonia.  Pharmacy has been consulted for Vancomycin  dosing.  Plan: Vancomycin  1000 mg IV q24h   Height: 5' 7 (170.2 cm) Weight: 68 kg (150 lb) IBW/kg (Calculated) : 66.1  Temp (24hrs), Avg:102.4 F (39.1 C), Min:102 F (38.9 C), Max:102.6 F (39.2 C)  Recent Labs  Lab 01/28/24 1723 01/28/24 1744  WBC 13.1*  --   CREATININE 1.47*  --   LATICACIDVEN  --  1.0    Estimated Creatinine Clearance: 42.5 mL/min (A) (by C-G formula based on SCr of 1.47 mg/dL (H)).    No Known Allergies   Logan Phillips 01/28/2024 11:40 PM

## 2024-01-28 NOTE — ED Notes (Signed)
 Patient transported to MRI

## 2024-01-28 NOTE — ED Notes (Signed)
 Temp: 102.4 F  MD aware. Blankets removed & icepacks placed underarms.

## 2024-01-28 NOTE — ED Triage Notes (Addendum)
 Pt was out playing golf and his friends called because pt was confused and acting erratic. Pt only oriented to self and place, disoriented to time and events. EMS noted a fever and admin 650 mg of APAP. EMS reports a negative stroke screen. Pt has had some ETOH today. No unilateral weakness or slurred speech noted during triage.    EMS Vitals  CBG 105 HR 130 ETCo2 40 BP 180/110

## 2024-01-28 NOTE — ED Notes (Signed)
Pt ambulatory to restroom w/ assistance from wife.

## 2024-01-29 ENCOUNTER — Encounter (HOSPITAL_COMMUNITY): Payer: Self-pay | Admitting: Internal Medicine

## 2024-01-29 ENCOUNTER — Other Ambulatory Visit: Payer: Self-pay

## 2024-01-29 DIAGNOSIS — A419 Sepsis, unspecified organism: Secondary | ICD-10-CM | POA: Diagnosis not present

## 2024-01-29 DIAGNOSIS — R6520 Severe sepsis without septic shock: Secondary | ICD-10-CM | POA: Diagnosis not present

## 2024-01-29 LAB — CBC
HCT: 31.1 % — ABNORMAL LOW (ref 39.0–52.0)
Hemoglobin: 10.3 g/dL — ABNORMAL LOW (ref 13.0–17.0)
MCH: 26.3 pg (ref 26.0–34.0)
MCHC: 33.1 g/dL (ref 30.0–36.0)
MCV: 79.3 fL — ABNORMAL LOW (ref 80.0–100.0)
Platelets: 178 K/uL (ref 150–400)
RBC: 3.92 MIL/uL — ABNORMAL LOW (ref 4.22–5.81)
RDW: 14.1 % (ref 11.5–15.5)
WBC: 14.7 K/uL — ABNORMAL HIGH (ref 4.0–10.5)
nRBC: 0 % (ref 0.0–0.2)

## 2024-01-29 LAB — BASIC METABOLIC PANEL WITH GFR
Anion gap: 12 (ref 5–15)
BUN: 19 mg/dL (ref 8–23)
CO2: 23 mmol/L (ref 22–32)
Calcium: 8.7 mg/dL — ABNORMAL LOW (ref 8.9–10.3)
Chloride: 96 mmol/L — ABNORMAL LOW (ref 98–111)
Creatinine, Ser: 1.29 mg/dL — ABNORMAL HIGH (ref 0.61–1.24)
GFR, Estimated: 59 mL/min — ABNORMAL LOW (ref >60)
Glucose, Bld: 110 mg/dL — ABNORMAL HIGH (ref 70–99)
Potassium: 4.1 mmol/L (ref 3.5–5.1)
Sodium: 131 mmol/L — ABNORMAL LOW (ref 135–145)

## 2024-01-29 LAB — MAGNESIUM: Magnesium: 1.9 mg/dL (ref 1.7–2.4)

## 2024-01-29 LAB — STREP PNEUMONIAE URINARY ANTIGEN: Strep Pneumo Urinary Antigen: NEGATIVE

## 2024-01-29 LAB — PROCALCITONIN: Procalcitonin: 2.33 ng/mL

## 2024-01-29 MED ORDER — LORAZEPAM 2 MG/ML IJ SOLN: 1.0000 mg | INTRAMUSCULAR | Status: AC | PRN

## 2024-01-29 MED ORDER — LORAZEPAM 1 MG PO TABS
1.0000 mg | ORAL_TABLET | ORAL | Status: AC | PRN
  Administered 2024-01-30 – 2024-01-31 (×2): 2 mg via ORAL
  Filled 2024-01-29 (×2): qty 2

## 2024-01-29 MED ORDER — ORAL CARE MOUTH RINSE: 15.0000 mL | OROMUCOSAL | Status: DC | PRN

## 2024-01-29 MED ORDER — ENSURE PLUS HIGH PROTEIN PO LIQD
237.0000 mL | Freq: Two times a day (BID) | ORAL | Status: DC
  Administered 2024-01-29 – 2024-02-05 (×5): 237 mL via ORAL

## 2024-01-29 MED ORDER — ADULT MULTIVITAMIN W/MINERALS CH
1.0000 | ORAL_TABLET | Freq: Every day | ORAL | Status: DC
  Administered 2024-01-29 – 2024-02-05 (×8): 1 via ORAL
  Filled 2024-01-29 (×8): qty 1

## 2024-01-29 MED ORDER — MAGNESIUM SULFATE IN D5W 1-5 GM/100ML-% IV SOLN
1.0000 g | Freq: Once | INTRAVENOUS | Status: AC
  Administered 2024-01-29: 1 g via INTRAVENOUS
  Filled 2024-01-29: qty 100

## 2024-01-29 MED ORDER — THIAMINE MONONITRATE 100 MG PO TABS
100.0000 mg | ORAL_TABLET | Freq: Every day | ORAL | Status: DC
  Administered 2024-01-29 – 2024-02-05 (×8): 100 mg via ORAL
  Filled 2024-01-29 (×8): qty 1

## 2024-01-29 MED ORDER — THIAMINE HCL 100 MG/ML IJ SOLN
100.0000 mg | Freq: Every day | INTRAMUSCULAR | Status: DC
  Filled 2024-01-29 (×2): qty 2

## 2024-01-29 MED ORDER — FOLIC ACID 1 MG PO TABS
1.0000 mg | ORAL_TABLET | Freq: Every day | ORAL | Status: DC
  Administered 2024-01-29 – 2024-02-05 (×8): 1 mg via ORAL
  Filled 2024-01-29 (×8): qty 1

## 2024-01-29 NOTE — Progress Notes (Signed)
 PROGRESS NOTE    Logan Phillips  FMW:[id number] DOB: 1951/01/04 DOA: 01/28/2024 PCP: Bulah Alm RAMAN, PA-C  Outpatient Specialists:     Brief Narrative:  Patient is a 73 year old male past medical history significant for necrotizing pneumonia, hypertension, diabetes mellitus, gout, alcohol abuse.  Patient was admitted with altered mental status.  There are concerns for possible severe sepsis and pneumonia.  Procalcitonin is elevated.  However, patient tells me that he took 10 mg of oxycodone  and alcohol, and became confused/encephalopathic.  01/29/2024: Patient seen alongside patient's nurse.  No new complaints.  Patient is awake and alert.  Patient is coherent.  Tmax of 102.6 degrees.   Assessment & Plan:   Principal Problem:   Severe sepsis [facility]) Active Problems:   Essential hypertension   Gastroesophageal reflux disease   Chronic gout without tophus   Brain aneurysm   Severe sepsis: - Meeting criteria for sepsis based on fever of 102.4, tachycardia heart rate 108-106, tachypnea respirate rate 18-27, leukocytosis of 15.1.  With evidence of endorgan dysfunction- encephalopathy. - CTA chest-bilateral lung consolidation with superimposed cavitation, compatible with history of lipoid pneumonia, slight progression from prior.  UA not suggestive of UTI.  Neck supple.  - Continue IV ceftriaxone, azithromycin  and vancomycin  - Follow cultures. -Sepsis physiology seems to have resolved significantly.     Pneumonia: -History of necrotizing pneumonia, lipoid pneumonia. - Patient has had ongoing productive cough over the past 1 year, no dyspnea, chronically feeling unwell.  He does landscaping -  spraying chemicals on lawns.  Never smoked cigarettes.  - Continue antibiotics - Follow urine Legionella, and strep antigen - Procalcitonin is elevated.   -  Swallow eval by Speech therapy eval - Low threshold to consult the pulmonary team.     Hyponatremia: -Sodium of 131 today.  -  Chronic hyponatremia.   Acute metabolic encephalopathy: -in the setting of severe sepsis. 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  Neck supple.   - Encephalopathy has resolved.   - Follow-up cultures - MRI brain negative for acute abnormality.   Alcohol abuse: - Drinks 1-2 beers or vodka, about 4 times a week -drinks socially.  No history of seizures, denies history of withdrawals.  Calm, no sign of withdrawal at this time. - CIWA as needed - Thiamine  folate multivitamins   Brain aneurysm-CT head and neck-3 to 4 mm basilar tip aneurysm. - Per my secure chat with neurologist Dr. Sharlynn follow-up with neurosurgery   Hypertension: - Controlled.     Gout - Continue allopurinol   Chronic pain: - Cautious use of narcotics.   DVT prophylaxis: Subcutaneous Lovenox  Code Status: Full code Family Communication: Daughter, Waddell Loss Disposition Plan: Inpatient   Consultants:  None  Procedures:  None  Antimicrobials:  IV vancomycin  IV Rocephin IV azithromycin    Subjective: No new complaints  Objective: Vitals:   01/29/24 0600 01/29/24 0621 01/29/24 1029 01/29/24 1313  BP: 118/69  127/71 110/71  Pulse: 78  90 62  Resp: 17  16 19   Temp:  98.3 F (36.8 C) 99.2 F (37.3 C) 97.9 F (36.6 C)  TempSrc:  Temporal  Oral  SpO2: 97%  100% 98%  Weight:    62.4 kg  Height:       No intake or output data in the 24 hours ending 01/29/24 1433 Filed Weights   01/28/24 1536 01/29/24 1313  Weight: 68 kg 62.4 kg    Examination:  General  exam: Appears calm and comfortable  Respiratory system: Clear to auscultation. Respiratory effort normal. Cardiovascular system: S1 & S2 heard Gastrointestinal system: Abdomen is soft and nontender.  Central nervous system: Alert and oriented.  Patient moves all extremities.. Extremities: No leg edema  Data Reviewed: I have personally  reviewed following labs and imaging studies  CBC: Recent Labs  Lab 01/28/24 1723 01/28/24 1747 01/29/24 0158  WBC 13.1*  --  14.7*  NEUTROABS 12.0*  --   --   HGB 11.4* 12.2* 10.3*  HCT 34.4* 36.0* 31.1*  MCV 79.8*  --  79.3*  PLT 218  --  178   Basic Metabolic Panel: Recent Labs  Lab 01/28/24 1723 01/28/24 1747 01/29/24 0158  NA 130* 130* 131*  K 4.6 4.6 4.1  CL 94*  --  96*  CO2 24  --  23  GLUCOSE 121*  --  110*  BUN 23  --  19  CREATININE 1.47*  --  1.29*  CALCIUM  9.4  --  8.7*  MG  --   --  1.9   GFR: Estimated Creatinine Clearance: 45.7 mL/min (A) (by C-G formula based on SCr of 1.29 mg/dL (H)). Liver Function Tests: Recent Labs  Lab 01/28/24 1723  AST 17  ALT 10  ALKPHOS 70  BILITOT 1.0  PROT 7.9  ALBUMIN 3.8   No results for input(s): LIPASE, AMYLASE in the last 168 hours. Recent Labs  Lab 01/28/24 1723  AMMONIA 23   Coagulation Profile: Recent Labs  Lab 01/28/24 1723  INR 1.2   Cardiac Enzymes: No results for input(s): CKTOTAL, CKMB, CKMBINDEX, TROPONINI in the last 168 hours. BNP (last 3 results) No results for input(s): PROBNP in the last 8760 hours. HbA1C: No results for input(s): HGBA1C in the last 72 hours. CBG: Recent Labs  Lab 01/28/24 1540  GLUCAP 134*   Lipid Profile: No results for input(s): CHOL, HDL, LDLCALC, TRIG, CHOLHDL, LDLDIRECT in the last 72 hours. Thyroid  Function Tests: No results for input(s): TSH, T4TOTAL, FREET4, T3FREE, THYROIDAB in the last 72 hours. Anemia Panel: No results for input(s): VITAMINB12, FOLATE, FERRITIN, TIBC, IRON, RETICCTPCT in the last 72 hours. Urine analysis:    Component Value Date/Time   COLORURINE YELLOW 01/28/2024 1556   APPEARANCEUR HAZY (A) 01/28/2024 1556   LABSPEC 1.025 01/28/2024 1556   LABSPEC 1.020 10/05/2017 0956   PHURINE 5.0 01/28/2024 1556   GLUCOSEU NEGATIVE 01/28/2024 1556   HGBUR MODERATE (A) 01/28/2024 1556    BILIRUBINUR NEGATIVE 01/28/2024 1556   BILIRUBINUR negative 08/05/2022 0949   BILIRUBINUR NEG 07/03/2013 1015   KETONESUR NEGATIVE 01/28/2024 1556   PROTEINUR 100 (A) 01/28/2024 1556   UROBILINOGEN 0.2 08/05/2022 0949   NITRITE NEGATIVE 01/28/2024 1556   LEUKOCYTESUR NEGATIVE 01/28/2024 1556   Sepsis Labs: @LABRCNTIP (procalcitonin:4,lacticidven:4)  ) Recent Results (from the past 240 hours)  Resp panel by RT-PCR (RSV, Flu A&B, Covid) Anterior Nasal Swab     Status: None   Collection Time: 01/28/24  3:56 PM   Specimen: Anterior Nasal Swab  Result Value Ref Range Status   SARS Coronavirus 2 by RT PCR NEGATIVE NEGATIVE Final   Influenza A by PCR NEGATIVE NEGATIVE Final   Influenza B by PCR NEGATIVE NEGATIVE Final    Comment: (NOTE) The Xpert Xpress SARS-CoV-2/FLU/RSV plus assay is intended as an aid in the diagnosis of influenza from Nasopharyngeal swab specimens and should not be used as a sole basis for treatment. Nasal washings and aspirates are unacceptable for Xpert Xpress SARS-CoV-2/FLU/RSV testing.  Fact Sheet for Patients: bloggercourse.com  Fact Sheet for Healthcare Providers: seriousbroker.it  This test is not yet approved or cleared by the [place]  FDA and has been authorized for detection and/or diagnosis of SARS-CoV-2 by FDA under an Emergency Use Authorization (EUA). This EUA will remain in effect (meaning this test can be used) for the duration of the COVID-19 declaration under Section 564(b)(1) of the Act, 21 U.S.C. section 360bbb-3(b)(1), unless the authorization is terminated or revoked.     Resp Syncytial Virus by PCR NEGATIVE NEGATIVE Final    Comment: (NOTE) Fact Sheet for Patients: bloggercourse.com  Fact Sheet for Healthcare Providers: seriousbroker.it  This test is not yet approved or cleared by the [place]  FDA and has been authorized  for detection and/or diagnosis of SARS-CoV-2 by FDA under an Emergency Use Authorization (EUA). This EUA will remain in effect (meaning this test can be used) for the duration of the COVID-19 declaration under Section 564(b)(1) of the Act, 21 U.S.C. section 360bbb-3(b)(1), unless the authorization is terminated or revoked.  Performed at [facility], [address] [place], [place] [address]   Culture, blood (Routine x 2)     Status: None (Preliminary result)   Collection Time: 01/28/24  5:20 PM   Specimen: BLOOD LEFT ARM  Result Value Ref Range Status   Specimen Description BLOOD LEFT ARM  Final   Special Requests   Final    BOTTLES DRAWN AEROBIC AND ANAEROBIC Blood Culture adequate volume   Culture   Final    NO GROWTH < 24 HOURS Performed at [facility], [address], [place], [place] [address]    Report Status PENDING  Incomplete  Culture, blood (Routine x 2)     Status: None (Preliminary result)   Collection Time: 01/28/24  5:52 PM   Specimen: BLOOD LEFT ARM  Result Value Ref Range Status   Specimen Description BLOOD LEFT ARM  Final   Special Requests   Final    BOTTLES DRAWN AEROBIC ONLY Blood Culture results may not be optimal due to an inadequate volume of blood received in culture bottles   Culture   Final    NO GROWTH < 24 HOURS Performed at [facility], [address] [place], [place] [address]    Report Status PENDING  Incomplete  Respiratory (~20 pathogens) panel by PCR     Status: None   Collection Time: 01/28/24  8:17 PM   Specimen: Nasopharyngeal Swab; Respiratory  Result Value Ref Range Status   Adenovirus NOT DETECTED NOT DETECTED Final   Coronavirus 229E NOT DETECTED NOT DETECTED Final    Comment: (NOTE) The Coronavirus on the Respiratory Panel, DOES NOT test for the novel  Coronavirus (2019 nCoV)    Coronavirus HKU1 NOT DETECTED NOT DETECTED Final   Coronavirus NL63 NOT DETECTED NOT DETECTED Final   Coronavirus OC43 NOT  DETECTED NOT DETECTED Final   Metapneumovirus NOT DETECTED NOT DETECTED Final   Rhinovirus / Enterovirus NOT DETECTED NOT DETECTED Final   Influenza A NOT DETECTED NOT DETECTED Final   Influenza B NOT DETECTED NOT DETECTED Final   Parainfluenza Virus 1 NOT DETECTED NOT DETECTED Final   Parainfluenza Virus 2 NOT DETECTED NOT DETECTED Final   Parainfluenza Virus 3 NOT DETECTED NOT DETECTED Final   Parainfluenza Virus 4 NOT DETECTED NOT DETECTED Final   Respiratory Syncytial Virus NOT DETECTED NOT DETECTED Final   Bordetella pertussis NOT DETECTED NOT DETECTED Final   Bordetella Parapertussis NOT  DETECTED NOT DETECTED Final   Chlamydophila pneumoniae NOT DETECTED NOT DETECTED Final   Mycoplasma pneumoniae NOT DETECTED NOT DETECTED Final    Comment: Performed at [facility], [address] [place], [place] [address]         Radiology Studies: MR Brain W and Wo Contrast Result Date: 01/28/2024 EXAM: MRI BRAIN WITH AND WITHOUT CONTRAST 01/28/2024 10:43:03 PM TECHNIQUE: Multiplanar multisequence MRI of the head/brain was performed with and without the administration of intravenous contrast. COMPARISON: None available. CLINICAL HISTORY: Headache, neuro deficit; Fever, stroke FINDINGS: BRAIN AND VENTRICLES: No acute infarct. No acute intracranial hemorrhage. No mass effect or midline shift. No hydrocephalus. Similar postsurgical changes in the region of the right cerebellopontine angle with similar extra-axial fluid collection causing mild mass effect in the right cerebellum. Similar mild linear enhancement in the right internal auditory canal and pars acusticus. No masslike enhancement. Normal flow voids. ORBITS: No acute abnormality. SINUSES: No acute abnormality. BONES AND SOFT TISSUES: Normal bone marrow signal and enhancement. No acute soft tissue abnormality. IMPRESSION: 1. No acute intracranial abnormality. 2. Similar right cerebellopontine angle postoperative changes as detailed above.  Electronically signed by: Gilmore Molt MD 01/28/2024 11:11 PM EST RP Workstation: [id number]   CT ANGIO HEAD NECK W WO CM Result Date: 01/28/2024 EXAM: CTA HEAD AND NECK WITH AND WITHOUT 01/28/2024 07:23:00 PM TECHNIQUE: CTA of the head and neck was performed with and without the administration of intravenous contrast. Multiplanar 2D and/or 3D reformatted images are provided for review. Automated exposure control, iterative reconstruction, and/or weight based adjustment of the mA/kV was utilized to reduce the radiation dose to as low as reasonably achievable. Stenosis of the internal carotid arteries measured using NASCET criteria. COMPARISON: None available CLINICAL HISTORY: Neuro deficit, acute, stroke suspected FINDINGS: AORTIC ARCH AND ARCH VESSELS: No dissection or arterial injury. No significant stenosis of the brachiocephalic or subclavian arteries. CERVICAL CAROTID ARTERIES: No dissection, arterial injury, or hemodynamically significant stenosis by NASCET criteria. CERVICAL VERTEBRAL ARTERIES: No dissection, arterial injury, or significant stenosis. LUNGS AND MEDIASTINUM: Unremarkable. SOFT TISSUES: No acute abnormality. BONES: No acute abnormality. ANTERIOR CIRCULATION: No significant stenosis of the internal carotid arteries. No significant stenosis of the anterior cerebral arteries. No significant stenosis of the middle cerebral arteries. Approximately 3-4 mm basilar tip aneurysm. POSTERIOR CIRCULATION: No significant stenosis of the posterior cerebral arteries. No significant stenosis of the basilar artery. No significant stenosis of the vertebral arteries. No aneurysm. OTHER: No dural venous sinus thrombosis on this non-dedicated study. IMPRESSION: 1. Approximately 3-4 mm basilar tip aneurysm. 2. No large vessel occlusion or hemodynamically significant stenosis. Electronically signed by: Gilmore Molt MD 01/28/2024 07:46 PM EST RP Workstation: [id number]   CT CHEST ABDOMEN PELVIS W  CONTRAST Result Date: 01/28/2024 CLINICAL DATA:  Sepsis, neurologic deficit EXAM: CT CHEST, ABDOMEN, AND PELVIS WITH CONTRAST TECHNIQUE: Multidetector CT imaging of the chest, abdomen and pelvis was performed following the standard protocol during bolus administration of intravenous contrast. RADIATION DOSE REDUCTION: This exam was performed according to the departmental dose-optimization program which includes automated exposure control, adjustment of the mA and/or kV according to patient size and/or use of iterative reconstruction technique. CONTRAST:  75mL OMNIPAQUE  IOHEXOL  350 MG/ML SOLN COMPARISON:  08/10/2023 FINDINGS: CT CHEST FINDINGS Cardiovascular: The heart is unremarkable without pericardial effusion. No evidence of thoracic aortic aneurysm or dissection. Mediastinum/Nodes: Stable mediastinal lymphadenopathy, with largest lymph node in the pretracheal region measuring 12 mm in short axis. Thyroid , trachea, and esophagus are unremarkable. Lungs/Pleura: Bilateral areas  of cavitating pneumonia are again identified, most pronounced in the left lower lobe. Overall, there has been mild progression since prior study. The areas of lung consolidation demonstrate low attenuation, compatible with findings of lipoid pneumonia noted on recent bronchoscopy 09/06/2023. Trace right pleural effusion. No pneumothorax. Musculoskeletal: No acute or destructive bony abnormalities. Reconstructed images demonstrate no additional findings. CT ABDOMEN PELVIS FINDINGS Hepatobiliary: No focal liver abnormality is seen. No gallstones, gallbladder wall thickening, or biliary dilatation. Pancreas: Unremarkable. No pancreatic ductal dilatation or surrounding inflammatory changes. Spleen: Normal in size without focal abnormality. Adrenals/Urinary Tract: Simple appearing right renal cortical cyst does not require specific imaging follow-up. Otherwise the kidneys are unremarkable with no urinary tract calculi or obstructive uropathy.  The adrenals and bladder are grossly normal. Stomach/Bowel: No bowel obstruction or ileus. No bowel wall thickening or inflammatory change. Vascular/Lymphatic: Aortic atherosclerosis. No enlarged abdominal or pelvic lymph nodes. Reproductive: Prostate is unremarkable. Other: No free fluid or free intraperitoneal gas. No abdominal wall hernia. Musculoskeletal: No acute or destructive bony abnormalities. Reconstructed images demonstrate no additional findings. IMPRESSION: 1. Low-attenuation consolidation within the bilateral lungs with superimposed areas of cavitation, compatible with known history of the lipoid pneumonia. Slight progression since prior study. 2. Stable mediastinal lymphadenopathy. 3. Trace right pleural effusion. 4. No acute intra-abdominal or intrapelvic process. 5.  Aortic Atherosclerosis (ICD10-I70.0). Electronically Signed   By: Ozell Daring M.D.   On: 01/28/2024 19:44   CT Head Wo Contrast Result Date: 01/28/2024 CLINICAL DATA:  Altered level of consciousness, confusion EXAM: CT HEAD WITHOUT CONTRAST TECHNIQUE: Contiguous axial images were obtained from the base of the skull through the vertex without intravenous contrast. RADIATION DOSE REDUCTION: This exam was performed according to the departmental dose-optimization program which includes automated exposure control, adjustment of the mA and/or kV according to patient size and/or use of iterative reconstruction technique. COMPARISON:  09/08/2018 FINDINGS: Brain: There is hypodensity within the inferior right occipital cortex, reference image 11/3 and image 12/5, consistent with age-indeterminate infarct. No evidence of acute hemorrhage. Lateral ventricles and midline structures are unremarkable. No acute extra-axial fluid collections. No mass effect. Vascular: No hyperdense vessel or unexpected calcification. Skull: Postsurgical changes from prior right occipital craniectomy. The remainder of the calvarium is unremarkable.  Sinuses/Orbits: Mild mucosal thickening within the left sphenoid sinus. Remaining paranasal sinuses are clear. Other: None. IMPRESSION: 1. Cortical hypodensity within the inferior right occipital lobe, which could reflect age indeterminate cortical infarct. Further evaluation with MRI may be useful. 2. No evidence of acute hemorrhage. 3. Postsurgical changes from prior right-sided acoustic neuroma resection. These results were called by telephone at the time of interpretation on 01/28/2024 at 5:03 pm to provider [facility] , who verbally acknowledged these results. Electronically Signed   By: Ozell Daring M.D.   On: 01/28/2024 17:21   DG Chest 2 View if patient is not in a treatment room. Result Date: 01/28/2024 CLINICAL DATA:  Possible sepsis.  Confusion. EXAM: CHEST - 2 VIEW COMPARISON:  09/06/2023, 02/28/2023 and chest CT 08/02/2023 FINDINGS: Lungs are adequately inflated demonstrate multifocal airspace density over the left mid to lower lung and right base as similar findings are seen on previous exams. Patient underwent bronchoscopy June 2025 with biopsy demonstrating lipoid pneumonia. No significant effusion or pneumothorax. Cardiomediastinal silhouette and remainder of the exam is unchanged. IMPRESSION: Multifocal airspace process over the left mid to lower lung and right base as similar findings are seen on previous exams. Previous bronchoscopy with biopsy June 2025 demonstrates lipoid pneumonia. Electronically  Signed   By: Toribio Agreste M.D.   On: 01/28/2024 16:33        Scheduled Meds:  allopurinol  100 mg Oral Daily   amphetamine -dextroamphetamine   10 mg Oral Q breakfast   enoxaparin  (LOVENOX ) injection  40 mg Subcutaneous Daily   feeding supplement  237 mL Oral BID BM   folic acid   1 mg Oral Daily   metoprolol  tartrate  100 mg Oral Daily   multivitamin with minerals  1 tablet Oral Daily   rosuvastatin   10 mg Oral Daily   thiamine   100 mg Oral Daily   Or   thiamine   100 mg  Intravenous Daily   Continuous Infusions:  sodium chloride  100 mL/hr at 01/29/24 1035   azithromycin      cefTRIAXone (ROCEPHIN)  IV     vancomycin        LOS: 1 day    Time spent: 55 minutes.    Leatrice Chapel, MD  [facility] Pager #: [phone] 7PM-7AM contact night coverage as above

## 2024-01-29 NOTE — Plan of Care (Signed)
°  Problem: Activity: °Goal: Ability to tolerate increased activity will improve °Outcome: Progressing °  °Problem: Respiratory: °Goal: Ability to maintain adequate ventilation will improve °Outcome: Progressing °Goal: Ability to maintain a clear airway will improve °Outcome: Progressing °  °

## 2024-01-29 NOTE — Evaluation (Signed)
 Clinical/Bedside Swallow Evaluation Patient Details  Name: Logan Phillips MRN: [id number] Date of Birth: 04-09-1950  Today's Date: 01/29/2024 Time: SLP Start Time (ACUTE ONLY): 1345 SLP Stop Time (ACUTE ONLY): 1400 SLP Time Calculation (min) (ACUTE ONLY): 15 min  Past Medical History:  Past Medical History:  Diagnosis Date   Chronic pain in shoulder 2018   [facility]   H/O echocardiogram 10/08/2016   EF 65-70%, normal wall motion, systolic function vigorous   Hearing loss    pending hearing aids 06/2014   Hypertension    Lipoma    Pneumonia    Varicose vein    Vestibular schwannoma (HCC) 04/2018   Past Surgical History:  Past Surgical History:  Procedure Laterality Date   APPENDECTOMY     APPLICATION OF CRANIAL NAVIGATION Right 05/09/2018   Procedure: APPLICATION OF CRANIAL NAVIGATION;  Surgeon: Lanis Pupa, MD;  Location: [facility] OR;  Service: Neurosurgery;  Laterality: Right;   BIOPSY  04/12/2018   Procedure: BIOPSY;  Surgeon: Kristie Lamprey, MD;  Location: [facility] ENDOSCOPY;  Service: Endoscopy;;   COLONOSCOPY     age 56, he did not f/u with referral 2018   COLONOSCOPY WITH PROPOFOL  N/A 04/12/2018   Procedure: COLONOSCOPY WITH PROPOFOL ;  Surgeon: Kristie Lamprey, MD;  Location[facility] ENDOSCOPY;  Service: Endoscopy;  Laterality: N/A;   CRANIOTOMY Right 05/09/2018   Procedure: Right retrosigmoid craniectomy, resection of acoustic neuroma with intraoperative facial monitoring/Brain Lab;  Surgeon: Lanis Pupa, MD;  Location: [facility] OR;  Service: Neurosurgery;  Laterality: Right;   LUMBAR DISC SURGERY  2002   Dr. Mora   VIDEO BRONCHOSCOPY N/A 09/06/2023   Procedure: BRONCHOSCOPY, WITH FLUOROSCOPY;  Surgeon: Meade Verdon RAMAN, MD;  Location: [facility] ENDOSCOPY;  Service: Pulmonary;  Laterality: N/A;   WISDOM TOOTH EXTRACTION     HPI:  Patient is a 73 y.o. male with PMH: necrotizing PNA, HTN, DM, gout, ETOH abuse, cerebellopontine angle tumor s/p resection in 2020 with small  amount of residual tumor left on the facial nerve per operative report, vestibular schannoma, hearing loss. He presented to the ED on 01/28/2024; he was playing golf with friends, one of whom called EMS due to patient having confusion. His ex-wife reported that he went to the parking lot and was opening and closing multiple doors randomly and didn't seem to recognize his daughter. Head CT-cortical hypodensity inferior right occipital lobe-could reflect age indeterminate infarct, MRI brain did not show an acute intracranial abnormality. CT chest abdomen and pelvis with contrast-low-attenuation consolidation within the bilateral lungs with superimposed area of cavitation, compatible with known history of lipoid pneumonia, slight progression since prior study. He met the criteria for severe sepsis with fever of 102.4, tachycardia, tachypnea, leukocytosis of 15.1, UA not suggestive of UTI.    Assessment / Plan / Recommendation  Clinical Impression  Patient is not currently presenting with clinical s/s of dysphagia as per this bedside swallow evaluation. He compensates for right sided facial and bilabial weakness from prior tumor independently. No significant difficulty with masticating regular solids, no overt s/s aspiration with solids or thin liquids. SLP not recommending further skilled intervention at this time. SLP Visit Diagnosis: Dysphagia, unspecified (R13.10)    Aspiration Risk  No limitations    Diet Recommendation Regular;Thin liquid    Liquid Administration via: Cup;Straw Medication Administration: Whole meds with liquid Supervision: Patient able to self feed Compensations: Slow rate;Small sips/bites Postural Changes: Seated upright at 90 degrees    Other  Recommendations Oral Care Recommendations: Oral care BID;Patient independent  with oral care     Assistance Recommended at Discharge    Functional Status Assessment Patient has not had a recent decline in their functional status   Frequency and Duration     N/A       Prognosis   N/A     Swallow Study   General Date of Onset: 01/28/24 HPI: Patient is a 73 y.o. male with PMH: necrotizing PNA, HTN, DM, gout, ETOH abuse, cerebellopontine angle tumor s/p resection in 2020 with small amount of residual tumor left on the facial nerve per operative report, vestibular schannoma, hearing loss. He presented to the ED on 01/28/2024; he was playing golf with friends, one of whom called EMS due to patient having confusion. His ex-wife reported that he went to the parking lot and was opening and closing multiple doors randomly and didn't seem to recognize his daughter. Head CT-cortical hypodensity inferior right occipital lobe-could reflect age indeterminate infarct, MRI brain did not show an acute intracranial abnormality. CT chest abdomen and pelvis with contrast-low-attenuation consolidation within the bilateral lungs with superimposed area of cavitation, compatible with known history of lipoid pneumonia, slight progression since prior study. He met the criteria for severe sepsis with fever of 102.4, tachycardia, tachypnea, leukocytosis of 15.1, UA not suggestive of UTI. Type of Study: Bedside Swallow Evaluation Previous Swallow Assessment: remote, BSE in 2020 Diet Prior to this Study: Regular;Thin liquids (Level 0) Temperature Spikes Noted: No Respiratory Status: Room air History of Recent Intubation: No Behavior/Cognition: Alert;Cooperative;Pleasant mood Oral Cavity Assessment: Within Functional Limits Oral Care Completed by SLP: No Oral Cavity - Dentition: Adequate natural dentition Vision: Functional for self-feeding Self-Feeding Abilities: Able to feed self Patient Positioning: Upright in bed Baseline Vocal Quality: Normal Volitional Cough: Strong Volitional Swallow: Able to elicit    Oral/Motor/Sensory Function Overall Oral Motor/Sensory Function: Other (comment) (h/o right sided facial and bilabial weakness from  tumor)   Ice Chips     Thin Liquid Thin Liquid: Within functional limits Presentation: Self Fed;Straw    Nectar Thick     Honey Thick     Puree     Solid     Solid: Within functional limits Presentation: Self Fed      Norleen IVAR Blase, MA, CCC-SLP Speech Therapy

## 2024-01-30 ENCOUNTER — Other Ambulatory Visit: Payer: Self-pay | Admitting: Medical

## 2024-01-30 DIAGNOSIS — A419 Sepsis, unspecified organism: Secondary | ICD-10-CM | POA: Diagnosis not present

## 2024-01-30 DIAGNOSIS — R6520 Severe sepsis without septic shock: Secondary | ICD-10-CM | POA: Diagnosis not present

## 2024-01-30 LAB — RENAL FUNCTION PANEL
Albumin: 2.4 g/dL — ABNORMAL LOW (ref 3.5–5.0)
Anion gap: 10 (ref 5–15)
BUN: 15 mg/dL (ref 8–23)
CO2: 24 mmol/L (ref 22–32)
Calcium: 8.1 mg/dL — ABNORMAL LOW (ref 8.9–10.3)
Chloride: 101 mmol/L (ref 98–111)
Creatinine, Ser: 1.21 mg/dL (ref 0.61–1.24)
GFR, Estimated: >60 mL/min (ref >60)
Glucose, Bld: 99 mg/dL (ref 70–99)
Phosphorus: 2.4 mg/dL — ABNORMAL LOW (ref 2.5–4.6)
Potassium: 4.1 mmol/L (ref 3.5–5.1)
Sodium: 135 mmol/L (ref 135–145)

## 2024-01-30 LAB — CBC WITH DIFFERENTIAL/PLATELET
Abs Immature Granulocytes: 0.06 K/uL (ref 0.00–0.07)
Basophils Absolute: 0 K/uL (ref 0.0–0.1)
Basophils Relative: 0 %
Eosinophils Absolute: 0.1 K/uL (ref 0.0–0.5)
Eosinophils Relative: 1 %
HCT: 28.9 % — ABNORMAL LOW (ref 39.0–52.0)
Hemoglobin: 9.5 g/dL — ABNORMAL LOW (ref 13.0–17.0)
Immature Granulocytes: 1 %
Lymphocytes Relative: 8 %
Lymphs Abs: 0.7 K/uL (ref 0.7–4.0)
MCH: 26.2 pg (ref 26.0–34.0)
MCHC: 32.9 g/dL (ref 30.0–36.0)
MCV: 79.8 fL — ABNORMAL LOW (ref 80.0–100.0)
Monocytes Absolute: 0.5 K/uL (ref 0.1–1.0)
Monocytes Relative: 5 %
Neutro Abs: 8.1 K/uL — ABNORMAL HIGH (ref 1.7–7.7)
Neutrophils Relative %: 85 %
Platelets: 163 K/uL (ref 150–400)
RBC: 3.62 MIL/uL — ABNORMAL LOW (ref 4.22–5.81)
RDW: 13.9 % (ref 11.5–15.5)
WBC: 9.5 K/uL (ref 4.0–10.5)
nRBC: 0 % (ref 0.0–0.2)

## 2024-01-30 LAB — URINE CULTURE: Culture: NO GROWTH

## 2024-01-30 LAB — MAGNESIUM: Magnesium: 2 mg/dL (ref 1.7–2.4)

## 2024-01-30 MED ORDER — K PHOS MONO-SOD PHOS DI & MONO 155-852-130 MG PO TABS
250.0000 mg | ORAL_TABLET | Freq: Four times a day (QID) | ORAL | Status: DC
  Administered 2024-01-30 – 2024-01-31 (×4): 250 mg via ORAL
  Filled 2024-01-30 (×5): qty 1

## 2024-01-30 NOTE — Progress Notes (Signed)
 PROGRESS NOTE    TOA Logan Phillips  FMW:[id number] DOB: 1950-11-05 DOA: 01/28/2024 PCP: Bulah Alm RAMAN, PA-C  Outpatient Specialists:     Brief Narrative:  Patient is a 73 year old male past medical history significant for necrotizing pneumonia, hypertension, diabetes mellitus, gout, alcohol abuse.  Patient was admitted with altered mental status.  There are concerns for possible severe sepsis and pneumonia.  Procalcitonin is elevated.  However, patient tells me that he took 10 mg of oxycodone  and alcohol, and became confused/encephalopathic.  01/29/2024: Patient seen alongside patient's nurse.  No new complaints.  Patient is awake and alert.  Patient is coherent.  Tmax of 102.6 degrees.  01/30/2024: Patient seen.  No new complaints.  Tmax of 100.9 F.  Confusion has resolved.  Continue antibiotics.   Assessment & Plan:   Principal Problem:   Severe sepsis [facility]) Active Problems:   Essential hypertension   Gastroesophageal reflux disease   Chronic gout without tophus   Brain aneurysm   Severe sepsis: - Meeting criteria for sepsis based on fever of 102.4, tachycardia heart rate 108-106, tachypnea respirate rate 18-27, leukocytosis of 15.1.  With evidence of endorgan dysfunction- encephalopathy. - CTA chest-bilateral lung consolidation with superimposed cavitation, compatible with history of lipoid pneumonia, slight progression from prior.  UA not suggestive of UTI.  Neck supple.  - Continue IV ceftriaxone, azithromycin  and vancomycin  - Follow cultures. -Sepsis physiology seems to have resolved significantly.  01/30/2024: Continue antibiotics.   Pneumonia: -History of necrotizing pneumonia, lipoid pneumonia. - Patient has had ongoing productive cough over the past 1 year, no dyspnea, chronically feeling unwell.  He does landscaping -  spraying chemicals on lawns.  Never smoked cigarettes.  - Continue antibiotics - Follow urine Legionella, and strep antigen - Procalcitonin is  elevated.   -  Swallow eval by Speech therapy eval - Low threshold to consult the pulmonary team.   01/30/2024: Continue antibiotics.   Hyponatremia: - Resolved. - Sodium of 135 today.     Acute metabolic encephalopathy: -in the setting of severe sepsis. 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  Neck supple.   - Encephalopathy has resolved.   - Follow-up cultures - MRI brain negative for acute abnormality. 01/30/2024: Resolved.   Alcohol abuse: - Drinks 1-2 beers or vodka, about 4 times a week -drinks socially.  No history of seizures, denies history of withdrawals.  Calm, no sign of withdrawal at this time. - CIWA as needed - Thiamine  folate multivitamins   Brain aneurysm-CT head and neck-3 to 4 mm basilar tip aneurysm. - Per my secure chat with neurologist Dr. Sharlynn follow-up with neurosurgery   Hypertension: - Controlled.     Gout - Continue allopurinol   Chronic pain: - Cautious use of narcotics.   DVT prophylaxis: Subcutaneous Lovenox  Code Status: Full code Family Communication: Daughter, [place] Loss Disposition Plan: Inpatient   Consultants:  None  Procedures:  None  Antimicrobials:  IV vancomycin  IV Rocephin IV azithromycin    Subjective: No new complaints  Objective: Vitals:   01/29/24 1629 01/29/24 2111 01/30/24 0614 01/30/24 0844  BP: 122/65 127/70 133/77 119/73  Pulse: 64 91 73 81  Resp: 18 18 18 17   Temp: 98.2 F (36.8 C) (!) 100.9 F (38.3 C) 98.4 F (36.9 C) 98.4 F (36.9 C)  TempSrc: Oral Oral  Oral  SpO2: 99% 97% 98% 99%  Weight:      Height:  Intake/Output Summary (Last 24 hours) at 01/30/2024 2003 Last data filed at 01/30/2024 1510 Gross per 24 hour  Intake 462.5 ml  Output --  Net 462.5 ml   Filed Weights   01/28/24 1536 01/29/24 1313  Weight: 68 kg 62.4 kg    Examination:  General exam: Appears calm and  comfortable  Respiratory system: Clear to auscultation. Respiratory effort normal. Cardiovascular system: S1 & S2 heard Gastrointestinal system: Abdomen is soft and nontender.  Central nervous system: Alert and oriented.  Patient moves all extremities.. Extremities: No leg edema  Data Reviewed: I have personally reviewed following labs and imaging studies  CBC: Recent Labs  Lab 01/28/24 1723 01/28/24 1747 01/29/24 0158 01/30/24 0425  WBC 13.1*  --  14.7* 9.5  NEUTROABS 12.0*  --   --  8.1*  HGB 11.4* 12.2* 10.3* 9.5*  HCT 34.4* 36.0* 31.1* 28.9*  MCV 79.8*  --  79.3* 79.8*  PLT 218  --  178 163   Basic Metabolic Panel: Recent Labs  Lab 01/28/24 1723 01/28/24 1747 01/29/24 0158 01/30/24 0425 01/30/24 0426  NA 130* 130* 131*  --  135  K 4.6 4.6 4.1  --  4.1  CL 94*  --  96*  --  101  CO2 24  --  23  --  24  GLUCOSE 121*  --  110*  --  99  BUN 23  --  19  --  15  CREATININE 1.47*  --  1.29*  --  1.21  CALCIUM  9.4  --  8.7*  --  8.1*  MG  --   --  1.9 2.0  --   PHOS  --   --   --   --  2.4*   GFR: Estimated Creatinine Clearance: 48.7 mL/min (by C-G formula based on SCr of 1.21 mg/dL). Liver Function Tests: Recent Labs  Lab 01/28/24 1723 01/30/24 0426  AST 17  --   ALT 10  --   ALKPHOS 70  --   BILITOT 1.0  --   PROT 7.9  --   ALBUMIN 3.8 2.4*   No results for input(s): LIPASE, AMYLASE in the last 168 hours. Recent Labs  Lab 01/28/24 1723  AMMONIA 23   Coagulation Profile: Recent Labs  Lab 01/28/24 1723  INR 1.2   Cardiac Enzymes: No results for input(s): CKTOTAL, CKMB, CKMBINDEX, TROPONINI in the last 168 hours. BNP (last 3 results) No results for input(s): PROBNP in the last 8760 hours. HbA1C: No results for input(s): HGBA1C in the last 72 hours. CBG: Recent Labs  Lab 01/28/24 1540  GLUCAP 134*   Lipid Profile: No results for input(s): CHOL, HDL, LDLCALC, TRIG, CHOLHDL, LDLDIRECT in the last 72 hours. Thyroid   Function Tests: No results for input(s): TSH, T4TOTAL, FREET4, T3FREE, THYROIDAB in the last 72 hours. Anemia Panel: No results for input(s): VITAMINB12, FOLATE, FERRITIN, TIBC, IRON, RETICCTPCT in the last 72 hours. Urine analysis:    Component Value Date/Time   COLORURINE YELLOW 01/28/2024 1556   APPEARANCEUR HAZY (A) 01/28/2024 1556   LABSPEC 1.025 01/28/2024 1556   LABSPEC 1.020 10/05/2017 0956   PHURINE 5.0 01/28/2024 1556   GLUCOSEU NEGATIVE 01/28/2024 1556   HGBUR MODERATE (A) 01/28/2024 1556   BILIRUBINUR NEGATIVE 01/28/2024 1556   BILIRUBINUR negative 08/05/2022 0949   BILIRUBINUR NEG 07/03/2013 1015   KETONESUR NEGATIVE 01/28/2024 1556   PROTEINUR 100 (A) 01/28/2024 1556   UROBILINOGEN 0.2 08/05/2022 0949   NITRITE NEGATIVE 01/28/2024 1556   LEUKOCYTESUR NEGATIVE  01/28/2024 1556   Sepsis Labs: @LABRCNTIP (procalcitonin:4,lacticidven:4)  ) Recent Results (from the past 240 hours)  Resp panel by RT-PCR (RSV, Flu A&B, Covid) Anterior Nasal Swab     Status: None   Collection Time: 01/28/24  3:56 PM   Specimen: Anterior Nasal Swab  Result Value Ref Range Status   SARS Coronavirus 2 by RT PCR NEGATIVE NEGATIVE Final   Influenza A by PCR NEGATIVE NEGATIVE Final   Influenza B by PCR NEGATIVE NEGATIVE Final    Comment: (NOTE) The Xpert Xpress SARS-CoV-2/FLU/RSV plus assay is intended as an aid in the diagnosis of influenza from Nasopharyngeal swab specimens and should not be used as a sole basis for treatment. Nasal washings and aspirates are unacceptable for Xpert Xpress SARS-CoV-2/FLU/RSV testing.  Fact Sheet for Patients: bloggercourse.com  Fact Sheet for Healthcare Providers: seriousbroker.it  This test is not yet approved or cleared by the [place]  FDA and has been authorized for detection and/or diagnosis of SARS-CoV-2 by FDA under an Emergency Use Authorization (EUA). This EUA will  remain in effect (meaning this test can be used) for the duration of the COVID-19 declaration under Section 564(b)(1) of the Act, 21 U.S.C. section 360bbb-3(b)(1), unless the authorization is terminated or revoked.     Resp Syncytial Virus by PCR NEGATIVE NEGATIVE Final    Comment: (NOTE) Fact Sheet for Patients: bloggercourse.com  Fact Sheet for Healthcare Providers: seriousbroker.it  This test is not yet approved or cleared by the [place]  FDA and has been authorized for detection and/or diagnosis of SARS-CoV-2 by FDA under an Emergency Use Authorization (EUA). This EUA will remain in effect (meaning this test can be used) for the duration of the COVID-19 declaration under Section 564(b)(1) of the Act, 21 U.S.C. section 360bbb-3(b)(1), unless the authorization is terminated or revoked.  Performed at [facility], [address], [place], [place] [address]   Culture, blood (Routine x 2)     Status: None (Preliminary result)   Collection Time: 01/28/24  5:20 PM   Specimen: BLOOD LEFT ARM  Result Value Ref Range Status   Specimen Description BLOOD LEFT ARM  Final   Special Requests   Final    BOTTLES DRAWN AEROBIC AND ANAEROBIC Blood Culture adequate volume   Culture   Final    NO GROWTH 2 DAYS Performed at [facility], [address], [place], [place] [address]    Report Status PENDING  Incomplete  Culture, blood (Routine x 2)     Status: None (Preliminary result)   Collection Time: 01/28/24  5:52 PM   Specimen: BLOOD LEFT ARM  Result Value Ref Range Status   Specimen Description BLOOD LEFT ARM  Final   Special Requests   Final    BOTTLES DRAWN AEROBIC ONLY Blood Culture results may not be optimal due to an inadequate volume of blood received in culture bottles   Culture   Final    NO GROWTH 2 DAYS Performed at [facility], [address], [place], [place] [address]    Report Status PENDING   Incomplete  Respiratory (~20 pathogens) panel by PCR     Status: None   Collection Time: 01/28/24  8:17 PM   Specimen: Nasopharyngeal Swab; Respiratory  Result Value Ref Range Status   Adenovirus NOT DETECTED NOT DETECTED Final   Coronavirus 229E NOT DETECTED NOT DETECTED Final    Comment: (NOTE) The Coronavirus on the Respiratory Panel, DOES NOT test for the novel  Coronavirus (2019  nCoV)    Coronavirus HKU1 NOT DETECTED NOT DETECTED Final   Coronavirus NL63 NOT DETECTED NOT DETECTED Final   Coronavirus OC43 NOT DETECTED NOT DETECTED Final   Metapneumovirus NOT DETECTED NOT DETECTED Final   Rhinovirus / Enterovirus NOT DETECTED NOT DETECTED Final   Influenza A NOT DETECTED NOT DETECTED Final   Influenza B NOT DETECTED NOT DETECTED Final   Parainfluenza Virus 1 NOT DETECTED NOT DETECTED Final   Parainfluenza Virus 2 NOT DETECTED NOT DETECTED Final   Parainfluenza Virus 3 NOT DETECTED NOT DETECTED Final   Parainfluenza Virus 4 NOT DETECTED NOT DETECTED Final   Respiratory Syncytial Virus NOT DETECTED NOT DETECTED Final   Bordetella pertussis NOT DETECTED NOT DETECTED Final   Bordetella Parapertussis NOT DETECTED NOT DETECTED Final   Chlamydophila pneumoniae NOT DETECTED NOT DETECTED Final   Mycoplasma pneumoniae NOT DETECTED NOT DETECTED Final    Comment: Performed at [facility], [address], [place], [place] [address]  Urine Culture (for pregnant, neutropenic or urologic patients or patients with an indwelling urinary catheter)     Status: None   Collection Time: 01/28/24 10:10 PM   Specimen: Urine, Clean Catch  Result Value Ref Range Status   Specimen Description URINE, CLEAN CATCH  Final   Special Requests NONE  Final   Culture   Final    NO GROWTH Performed at [facility], [address], [place], [place] [address]    Report Status 01/30/2024 FINAL  Final         Radiology Studies: MR Brain W and Wo Contrast Result Date: 01/28/2024 EXAM: MRI BRAIN  WITH AND WITHOUT CONTRAST 01/28/2024 10:43:03 PM TECHNIQUE: Multiplanar multisequence MRI of the head/brain was performed with and without the administration of intravenous contrast. COMPARISON: None available. CLINICAL HISTORY: Headache, neuro deficit; Fever, stroke FINDINGS: BRAIN AND VENTRICLES: No acute infarct. No acute intracranial hemorrhage. No mass effect or midline shift. No hydrocephalus. Similar postsurgical changes in the region of the right cerebellopontine angle with similar extra-axial fluid collection causing mild mass effect in the right cerebellum. Similar mild linear enhancement in the right internal auditory canal and pars acusticus. No masslike enhancement. Normal flow voids. ORBITS: No acute abnormality. SINUSES: No acute abnormality. BONES AND SOFT TISSUES: Normal bone marrow signal and enhancement. No acute soft tissue abnormality. IMPRESSION: 1. No acute intracranial abnormality. 2. Similar right cerebellopontine angle postoperative changes as detailed above. Electronically signed by: Gilmore Molt MD 01/28/2024 11:11 PM EST RP Workstation: [id number]        Scheduled Meds:  allopurinol  100 mg Oral Daily   amphetamine -dextroamphetamine   10 mg Oral Q breakfast   enoxaparin  (LOVENOX ) injection  40 mg Subcutaneous Daily   feeding supplement  237 mL Oral BID BM   folic acid   1 mg Oral Daily   metoprolol  tartrate  100 mg Oral Daily   multivitamin with minerals  1 tablet Oral Daily   phosphorus  250 mg Oral QID   rosuvastatin   10 mg Oral Daily   thiamine   100 mg Oral Daily   Or   thiamine   100 mg Intravenous Daily   Continuous Infusions:  azithromycin  500 mg (01/30/24 1804)   cefTRIAXone (ROCEPHIN)  IV 2 g (01/30/24 1732)   vancomycin  1,000 mg (01/30/24 1739)     LOS: 2 days    Time spent: 35 minutes.    Leatrice Chapel, MD  [facility] Pager #: [phone] 7PM-7AM contact night coverage as above

## 2024-01-30 NOTE — Telephone Encounter (Unsigned)
 Copied from CRM #[id number]. Topic: Clinical - Medication Refill >> Jan 30, 2024 10:47 AM Charlet HERO wrote: Medication: amphetamine -dextroamphetamine  (ADDERALL) tablet 10 mg Testosterone  25 MG/2.5GM (1%) GEL meloxicam  (MOBIC ) 15 MG tablet  Has the patient contacted their pharmacy? Yes 0 reffills  This is the patient's preferred pharmacy:  [id number] [place] [place], [place] - [address] AT [facility] [address] [facility] [address] [place] [address] Phone: [phone] Fax: [phone]   Is this the correct pharmacy for this prescription? Yes If no, delete pharmacy and type the correct one.   Has the prescription been filled recently? Yes  Is the patient out of the medication? Yes  Has the patient been seen for an appointment in the last year OR does the patient have an upcoming appointment? Yes  Can we respond through MyChart? Yes  Agent: Please be advised that Rx refills may take up to 3 business days. We ask that you follow-up with your pharmacy.

## 2024-01-31 DIAGNOSIS — R6520 Severe sepsis without septic shock: Secondary | ICD-10-CM | POA: Diagnosis not present

## 2024-01-31 DIAGNOSIS — A419 Sepsis, unspecified organism: Secondary | ICD-10-CM | POA: Diagnosis not present

## 2024-01-31 LAB — MRSA NEXT GEN BY PCR, NASAL: MRSA by PCR Next Gen: DETECTED — AB

## 2024-01-31 LAB — GLUCOSE, CAPILLARY: Glucose-Capillary: 117 mg/dL — ABNORMAL HIGH (ref 70–99)

## 2024-01-31 LAB — LEGIONELLA PNEUMOPHILA SEROGP 1 UR AG: L. pneumophila Serogp 1 Ur Ag: NEGATIVE

## 2024-01-31 MED ORDER — K PHOS MONO-SOD PHOS DI & MONO 155-852-130 MG PO TABS
250.0000 mg | ORAL_TABLET | Freq: Three times a day (TID) | ORAL | Status: DC
  Administered 2024-01-31 – 2024-02-05 (×15): 250 mg via ORAL
  Filled 2024-01-31 (×15): qty 1

## 2024-01-31 MED ORDER — MUPIROCIN 2 % EX OINT
1.0000 | TOPICAL_OINTMENT | Freq: Two times a day (BID) | CUTANEOUS | Status: AC
  Administered 2024-01-31 – 2024-02-04 (×10): 1 via NASAL
  Filled 2024-01-31 (×4): qty 22

## 2024-01-31 MED ORDER — GUAIFENESIN ER 600 MG PO TB12
600.0000 mg | ORAL_TABLET | Freq: Two times a day (BID) | ORAL | Status: DC
  Administered 2024-01-31 – 2024-02-03 (×8): 600 mg via ORAL
  Filled 2024-01-31 (×8): qty 1

## 2024-01-31 MED ORDER — CHLORHEXIDINE GLUCONATE CLOTH 2 % EX PADS
6.0000 | MEDICATED_PAD | Freq: Every day | CUTANEOUS | Status: AC
  Administered 2024-01-31 – 2024-02-04 (×5): 6 via TOPICAL

## 2024-01-31 NOTE — Care Management Important Message (Signed)
 Important Message  Patient Details  Name: Logan Phillips MRN: [id number] Date of Birth: January 01, 1951   Important Message Given:  Yes - Medicare IM     Claretta Deed 01/31/2024, 1:03 PM

## 2024-01-31 NOTE — Discharge Instructions (Signed)
[facility] Stay Proper nutrition can help your body recover from illness and injury.   Foods and beverages high in protein, vitamins, and minerals help rebuild muscle loss, promote healing, & reduce fall risk.   In addition to eating healthy foods, a nutrition shake is an easy, delicious way to get the nutrition you need during and after your hospital stay  It is recommended that you continue to drink 2 bottles per day of:       Ensure Plus for at least 1 month (30 days) after your hospital stay   Tips for adding a nutrition shake into your routine: As allowed, drink one with vitamins or medications instead of water or juice Enjoy one as a tasty mid-morning or afternoon snack Drink cold or make a milkshake out of it Drink one instead of milk with cereal or snacks Use as a coffee creamer   Available at the following grocery stores and pharmacies:           * [place] [phone]            For COUPONS visit: www.[email or web address].com/join or [email or web address]   Suggested Substitutions Ensure Plus = Boost Plus = Carnation Breakfast Essentials = Boost Compact

## 2024-01-31 NOTE — Progress Notes (Signed)
 PROGRESS NOTE  Logan Phillips  FMW:[id number] DOB: 1950/05/28 DOA: 01/28/2024 PCP: Bulah Alm RAMAN, PA-C   Brief Narrative: Patient is a 73 year old male with history of necrotizing pneumonia, hypertension, diabetes type 2, gout, alcohol abuse who presented with confusion.  He was playing golf with his friends and suddenly became confused.  Was having protocol for posterior.  On condition he was febrile up to 102.4, tachycardic.  Blood pressure was stable.  He was saturating fine on room air.  Labs showed WC count of 13.1.  COVID/flu/RSV negative.  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  Patient is started on broad spectrum antibiotics for management of pneumonia.  Clinically improving.  Assessment & Plan:  Principal Problem:   Severe sepsis (HCC) Active Problems:   Essential hypertension   Gastroesophageal reflux disease   Chronic gout without tophus   Brain aneurysm   Severe sepsis: Presented with high-grade fever, tachycardia, tachypnea, leukocytosis.  CT imaging showed consolidation within the bilateral lungs with superimposed area of cavitation, compatible with known history of lipoid pneumonia, slight progression since prior study.  Patient was started on broad spectrum antibiotics for management of pneumonia.  Now clinically improving.  Cultures have remained negative so far.  Procalcitonin was elevated. COVID/flu/RSV negative.  Sepsis physiology has significantly improved.  Leukocytosis resolved.  Had fever of 100.5 F early this morning.  Continue current antibiotics.  Currently on ceftriaxone, azithromycin , vancomycin .  Will check MRSA  PCR.  If negative,please  DC vancomycin   History of recurrent pneumonia: History of recurrent  pneumonia pneumonia. Follows with Dr Meade. Productive cough over past 1 year no dyspnea.  Works as a arboriculturist .  Never smoked.  Currently on room air.  Speech therapy evaluated the patient and recommended regular diet.  We recommend to follow-up with Dr. Meade, pulmonology as an outpatient after discharge.  Hyponatremia: Resolved  Acute metabolic encephalopathy: In the setting of severe sepsis.  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  Case was discussed with neurology.  MRI did not show any acute intracranial abnormalities.  Currently alert and oriented  Alcohol use: Drinks 1-2 beers or vodka about 4 times a week.  No withdrawals.  Continue thiamine , folic acid   Brain aneurysm: CT head/neck showed 3 to 4 mm basilar tip aneurysm.  Case discussed with neurology.  Recommend outpatient follow-up with neurosurgery.  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  Hypertension: Currently blood pressure well-controlled.  Continue current medications  Gout: Continue allopurinol  Hypophosphatemia: Currently being supplemented  Poor oral intake/malnutrition: Mentioned that he has been eating poorly and losing weight since last few months.  We consulted dietitian  Deconditioning/debility: PT consulted          DVT prophylaxis:enoxaparin  (LOVENOX ) injection 40 mg Start: 01/29/24 1000     Code Status: Full Code  Family Communication: Discussed with daughter at bedside on 11/18  Patient status: Inpatient  Patient is from : Home  Anticipated discharge to: Home  Estimated DC date: 1 to 2 days   Consultants: None  Procedures: None  Antimicrobials:  Anti-infectives (From admission, onward)    Start     Dose/Rate  Route Frequency Ordered Stop    01/29/24 1800  cefTRIAXone (ROCEPHIN) 2 g in sodium chloride  0.9 % 100 mL IVPB        2 g 200 mL/hr over 30 Minutes Intravenous Every 24 hours 01/28/24 2308     01/29/24 1800  azithromycin  (ZITHROMAX ) 500 mg in sodium chloride  0.9 % 250 mL IVPB        500 mg 250 mL/hr over 60 Minutes Intravenous Every 24 hours 01/28/24 2308 02/03/24 1759   01/29/24 1800  vancomycin  (VANCOCIN ) IVPB 1000 mg/200 mL premix        1,000 mg 200 mL/hr over 60 Minutes Intravenous Every 24 hours 01/28/24 2342     01/28/24 2359  vancomycin  (VANCOCIN ) IVPB 1000 mg/200 mL premix        1,000 mg 200 mL/hr over 60 Minutes Intravenous  Once 01/28/24 2342 01/29/24 0207   01/28/24 1815  cefTRIAXone (ROCEPHIN) 1 g in sodium chloride  0.9 % 100 mL IVPB  Status:  Discontinued        1 g 200 mL/hr over 30 Minutes Intravenous  Once 01/28/24 1802 01/28/24 1802   01/28/24 1815  azithromycin  (ZITHROMAX ) 500 mg in sodium chloride  0.9 % 250 mL IVPB        500 mg 250 mL/hr over 60 Minutes Intravenous  Once 01/28/24 1802 01/28/24 2129   01/28/24 1815  cefTRIAXone (ROCEPHIN) 2 g in sodium chloride  0.9 % 100 mL IVPB        2 g 200 mL/hr over 30 Minutes Intravenous  Once 01/28/24 1802 01/28/24 2005       Subjective: Patient seen and examined the bedside today.  Hemodynamically stable.  Had fever of 100.5 F early this morning.  Afebrile during my evaluation.  Lying in bed.  Appears deconditioned but overall comfortable.  Has been on room air.  Complains of chest pain on cough.  Was speaking in full sentences during my evaluation.  Objective: Vitals:   01/30/24 2027 01/31/24 0509 01/31/24 0702 01/31/24 0759  BP: (!) 155/95 (!) 159/90 131/80 120/76  Pulse: 87 (!) 103 70 71  Resp: 17 17 16 19   Temp: 98.3 F (36.8 C) (!) 100.5 F (38.1 C) 98.6 F (37 C) 97.7 F (36.5 C)  TempSrc: Oral Oral    SpO2: 99% 95% 95% 96%  Weight:      Height:        Intake/Output Summary (Last 24 hours) at 01/31/2024 0800 Last data filed at  01/31/2024 0500 Gross per 24 hour  Intake 1152.5 ml  Output --  Net 1152.5 ml   Filed Weights   01/28/24 1536 01/29/24 1313  Weight: 68 kg 62.4 kg    Examination:  General exam: Overall comfortable, not in distress, thin built HEENT: PERRL Respiratory system:  no wheezes or crackles , mildly diminished sounds bilaterally in bases Cardiovascular system: S1 & S2 heard, RRR.  Gastrointestinal system: Abdomen is nondistended, soft and nontender. Central nervous system: Alert and oriented Extremities: No edema, no clubbing ,no cyanosis Skin: No rashes, no ulcers,no icterus     Data Reviewed: I have personally reviewed following labs and imaging studies  CBC: Recent Labs  Lab 01/28/24 1723 01/28/24 1747 01/29/24 0158 01/30/24 0425  WBC 13.1*  --  14.7* 9.5  NEUTROABS 12.0*  --   --  8.1*  HGB 11.4* 12.2* 10.3* 9.5*  HCT 34.4* 36.0* 31.1* 28.9*  MCV 79.8*  --  79.3* 79.8*  PLT 218  --  178 163   Basic  Metabolic Panel: Recent Labs  Lab 01/28/24 1723 01/28/24 1747 01/29/24 0158 01/30/24 0425 01/30/24 0426  NA 130* 130* 131*  --  135  K 4.6 4.6 4.1  --  4.1  CL 94*  --  96*  --  101  CO2 24  --  23  --  24  GLUCOSE 121*  --  110*  --  99  BUN 23  --  19  --  15  CREATININE 1.47*  --  1.29*  --  1.21  CALCIUM  9.4  --  8.7*  --  8.1*  MG  --   --  1.9 2.0  --   PHOS  --   --   --   --  2.4*     Recent Results (from the past 240 hours)  Resp panel by RT-PCR (RSV, Flu A&B, Covid) Anterior Nasal Swab     Status: None   Collection Time: 01/28/24  3:56 PM   Specimen: Anterior Nasal Swab  Result Value Ref Range Status   SARS Coronavirus 2 by RT PCR NEGATIVE NEGATIVE Final   Influenza A by PCR NEGATIVE NEGATIVE Final   Influenza B by PCR NEGATIVE NEGATIVE Final    Comment: (NOTE) The Xpert Xpress SARS-CoV-2/FLU/RSV plus assay is intended as an aid in the diagnosis of influenza from Nasopharyngeal swab specimens and should not be used as a sole basis for treatment.  Nasal washings and aspirates are unacceptable for Xpert Xpress SARS-CoV-2/FLU/RSV testing.  Fact Sheet for Patients: bloggercourse.com  Fact Sheet for Healthcare Providers: seriousbroker.it  This test is not yet approved or cleared by the [place]  FDA and has been authorized for detection and/or diagnosis of SARS-CoV-2 by FDA under an Emergency Use Authorization (EUA). This EUA will remain in effect (meaning this test can be used) for the duration of the COVID-19 declaration under Section 564(b)(1) of the Act, 21 U.S.C. section 360bbb-3(b)(1), unless the authorization is terminated or revoked.     Resp Syncytial Virus by PCR NEGATIVE NEGATIVE Final    Comment: (NOTE) Fact Sheet for Patients: bloggercourse.com  Fact Sheet for Healthcare Providers: seriousbroker.it  This test is not yet approved or cleared by the [place]  FDA and has been authorized for detection and/or diagnosis of SARS-CoV-2 by FDA under an Emergency Use Authorization (EUA). This EUA will remain in effect (meaning this test can be used) for the duration of the COVID-19 declaration under Section 564(b)(1) of the Act, 21 U.S.C. section 360bbb-3(b)(1), unless the authorization is terminated or revoked.  Performed at [facility], [address], [place], [place] [address]   Culture, blood (Routine x 2)     Status: None (Preliminary result)   Collection Time: 01/28/24  5:20 PM   Specimen: BLOOD LEFT ARM  Result Value Ref Range Status   Specimen Description BLOOD LEFT ARM  Final   Special Requests   Final    BOTTLES DRAWN AEROBIC AND ANAEROBIC Blood Culture adequate volume   Culture   Final    NO GROWTH 2 DAYS Performed at [facility], [address], [place], [place] [address]    Report Status PENDING  Incomplete  Culture, blood (Routine x 2)     Status: None (Preliminary result)    Collection Time: 01/28/24  5:52 PM   Specimen: BLOOD LEFT ARM  Result Value Ref Range Status   Specimen Description BLOOD LEFT ARM  Final   Special Requests   Final    BOTTLES DRAWN AEROBIC  ONLY Blood Culture results may not be optimal due to an inadequate volume of blood received in culture bottles   Culture   Final    NO GROWTH 2 DAYS Performed at [facility], [address] [place], [place] [address]    Report Status PENDING  Incomplete  Respiratory (~20 pathogens) panel by PCR     Status: None   Collection Time: 01/28/24  8:17 PM   Specimen: Nasopharyngeal Swab; Respiratory  Result Value Ref Range Status   Adenovirus NOT DETECTED NOT DETECTED Final   Coronavirus 229E NOT DETECTED NOT DETECTED Final    Comment: (NOTE) The Coronavirus on the Respiratory Panel, DOES NOT test for the novel  Coronavirus (2019 nCoV)    Coronavirus HKU1 NOT DETECTED NOT DETECTED Final   Coronavirus NL63 NOT DETECTED NOT DETECTED Final   Coronavirus OC43 NOT DETECTED NOT DETECTED Final   Metapneumovirus NOT DETECTED NOT DETECTED Final   Rhinovirus / Enterovirus NOT DETECTED NOT DETECTED Final   Influenza A NOT DETECTED NOT DETECTED Final   Influenza B NOT DETECTED NOT DETECTED Final   Parainfluenza Virus 1 NOT DETECTED NOT DETECTED Final   Parainfluenza Virus 2 NOT DETECTED NOT DETECTED Final   Parainfluenza Virus 3 NOT DETECTED NOT DETECTED Final   Parainfluenza Virus 4 NOT DETECTED NOT DETECTED Final   Respiratory Syncytial Virus NOT DETECTED NOT DETECTED Final   Bordetella pertussis NOT DETECTED NOT DETECTED Final   Bordetella Parapertussis NOT DETECTED NOT DETECTED Final   Chlamydophila pneumoniae NOT DETECTED NOT DETECTED Final   Mycoplasma pneumoniae NOT DETECTED NOT DETECTED Final    Comment: Performed at [facility], [address] [place], [place] [address]  Urine Culture (for pregnant, neutropenic or urologic patients or patients with an indwelling urinary catheter)      Status: None   Collection Time: 01/28/24 10:10 PM   Specimen: Urine, Clean Catch  Result Value Ref Range Status   Specimen Description URINE, CLEAN CATCH  Final   Special Requests NONE  Final   Culture   Final    NO GROWTH Performed at [facility], [address], [place], [place] [address]    Report Status 01/30/2024 FINAL  Final     Radiology Studies: No results found.  Scheduled Meds:  allopurinol  100 mg Oral Daily   amphetamine -dextroamphetamine   10 mg Oral Q breakfast   enoxaparin  (LOVENOX ) injection  40 mg Subcutaneous Daily   feeding supplement  237 mL Oral BID BM   folic acid   1 mg Oral Daily   metoprolol  tartrate  100 mg Oral Daily   multivitamin with minerals  1 tablet Oral Daily   phosphorus  250 mg Oral QID   rosuvastatin   10 mg Oral Daily   thiamine   100 mg Oral Daily   Or   thiamine   100 mg Intravenous Daily   Continuous Infusions:  azithromycin  500 mg (01/30/24 1804)   cefTRIAXone (ROCEPHIN)  IV 2 g (01/30/24 1732)   vancomycin  1,000 mg (01/30/24 1739)     LOS: 3 days   Ivonne Mustache, MD [facility] P11/18/2025, 8:00 AM

## 2024-01-31 NOTE — TOC CAGE-AID Note (Signed)
 Transition of Care [facility]) - CAGE-AID Screening   Patient Details  Name: Logan Phillips MRN: [id number] Date of Birth: Dec 25, 1950  Transition of Care [facility]) CM/SW Contact:    Lendia Dais, LCSWA Phone Number: 01/31/2024, 2:39 PM   Clinical Narrative: Pt denied any alcohol/substance use.  No resources provided.    CAGE-AID Screening: Substance Abuse Screening unable to be completed due to: : Patient Refused             Substance Abuse Education Offered: No

## 2024-01-31 NOTE — Progress Notes (Signed)
   01/31/24 1616  Spiritual Encounters  Type of Visit Initial  Care provided to: Patient  OnCall Visit No   Patient declined AD at this time. Chaplain remains available upon request

## 2024-01-31 NOTE — Care Management Important Message (Signed)
 Important Message  Patient Details  Name: Logan Phillips MRN: [id number] Date of Birth: [birth year]   Important Message Given:  Yes - Medicare IM     Claretta Deed 01/31/2024, 9:50 AM

## 2024-01-31 NOTE — Progress Notes (Signed)
 Initial Nutrition Assessment  DOCUMENTATION CODES:  Non-severe (moderate) malnutrition in context of chronic illness  INTERVENTION:  Continue Folic Acid , Thiamine , and Multivitamin w/ minerals daily Liberalized diet to Regular due to malnutrition Encourage good PO intake Ok for family to bring food as desired Ensure Plus High Protein po BID, each supplement provides 350 kcal and 20 grams of protein Magic cup BID with meals, each supplement provides 290 kcal and 9 grams of protein  NUTRITION DIAGNOSIS:  Moderate Malnutrition related to chronic illness as evidenced by mild fat depletion, moderate muscle depletion.  GOAL:  Patient will meet greater than or equal to 90% of their needs  MONITOR:  PO intake, Supplement acceptance, Weight trends, Labs  REASON FOR ASSESSMENT:  Consult Assessment of nutrition requirement/status  ASSESSMENT:  73 y.o. male presented to the ED with altered mental status. PMH includes necrotizing pneumonia, lipoid pneumonia, HTN, gout, GERD, CKD III, EtOH abuse, and HLD. Pt admitted with severe sepsis, pneumonia, hyponatremia, and acute metabolic encephalopathy.   11/15 - Admitted   Pt sleeping at time of RD visit, woke to RD voice and touch. Pt reports that he has been eating okay at home and denied any changes in his appetite. Reports that he does not skip meals and that he will have snacks occasionally, here and there. Shares that he will also drink protein shakes, but not regularly. Typically drinks diet coke or pepsi.   Typical Intake Breakfast: cereal Lunch: sandwich or frozen meals Dinner: sandwich or frozen meals  RD discussed using Ensure to help with meeting nutritional needs and maintaining lean muscle mass. Pt reports that he likes Ensure but prefers strawberry or vanilla flavor, RD informed pt that we only carry vanilla or chocolate in the hospital. Discussed RD also ordering Magic Cup on trays to help with PO intake as well. Pt agreeable to  both supplements. RD provided pt daughter with Ensure coupons for discharge.   Meal Intakes None documented   Patient reports that he has had weight loss over the past few years. Notes his UBW was 175-180# and last recalls weighing that in January 2020. Patient denies any recent weight loss within the past year. Per chart review, patients weight has fluctuated, but overall with a 5% weight loss within the past 5 months.  Admission Weight: 68 kg - appears stated Current Weight: 62.4 kg   Nutrition Related Medications: Folic acid , MVI, K phos, Thiamine  Drips Zithromax  Rocephin Vancomycin  Labs (01/30/24): Sodium 135, Potassium 4.1, BUN 15, Creatinine 1.21, Phosphorus 2.4, Magnesium  2.0  NUTRITION - FOCUSED PHYSICAL EXAM: Flowsheet Row Most Recent Value  Orbital Region Mild depletion  Upper Arm Region Mild depletion  Thoracic and Lumbar Region Mild depletion  Buccal Region Mild depletion  Temple Region Moderate depletion  Clavicle Bone Region Moderate depletion  Clavicle and Acromion Bone Region Mild depletion  Scapular Bone Region Mild depletion  Dorsal Hand No depletion  Patellar Region Moderate depletion  Anterior Thigh Region Moderate depletion  Posterior Calf Region Moderate depletion  Edema (RD Assessment) None  Hair Reviewed  Eyes Reviewed  Mouth Reviewed  Skin Reviewed  Nails Reviewed    Diet Order:   Diet Order             Diet regular Room service appropriate? Yes; Fluid consistency: Thin  Diet effective now                  EDUCATION NEEDS:  Education needs have been addressed  Skin:  Skin Assessment: Reviewed RN  Assessment  Last BM:  11/14  Height:  Ht Readings from Last 1 Encounters:  01/28/24 5' 7 (1.702 m)   Weight:  Wt Readings from Last 1 Encounters:  01/29/24 62.4 kg   Ideal Body Weight:  67.3 kg  BMI:  Body mass index is 21.55 kg/m.  Estimated Nutritional Needs:  Kcal:  1800-2000 Protein:  90-110 grams Fluid:  >/= 1.8  L   Nestora Glatter RD, LDN Registered Dietitian I Please see AMION for contact information

## 2024-01-31 NOTE — Progress Notes (Signed)
   01/31/24 0509  Assess: MEWS Score  Temp (!) 100.5 F (38.1 C)  BP (!) 159/90  MAP (mmHg) 112  Pulse Rate (!) 103  ECG Heart Rate (!) 114  Resp 17  SpO2 95 %  O2 Device Room Air  Assess: MEWS Score  MEWS Temp 1  MEWS Systolic 0  MEWS Pulse 2  MEWS RR 0  MEWS LOC 0  MEWS Score 3  MEWS Score Color Yellow  Assess: if the MEWS score is Yellow or Red  Were vital signs accurate and taken at a resting state? Yes  Does the patient meet 2 or more of the SIRS criteria? No  MEWS guidelines implemented  Yes, yellow  Treat  MEWS Interventions Considered administering scheduled or prn medications/treatments as ordered  Take Vital Signs  Increase Vital Sign Frequency  Yellow: Q2hr x1, continue Q4hrs until patient remains green for 12hrs  Escalate  MEWS: Escalate Yellow: Discuss with charge nurse and consider notifying provider and/or RRT  Notify: Charge Nurse/RN  Name of Charge Nurse/RN Notified Vertell, RN  Provider Notification  Provider Name/Title N/A  Notify: Rapid Response  Name of Rapid Response RN Notified N/A  Assess: SIRS CRITERIA  SIRS Temperature  0  SIRS Respirations  0  SIRS Pulse 1  SIRS WBC 0  SIRS Score Sum  1

## 2024-01-31 NOTE — TOC Initial Note (Addendum)
 Transition of Care [facility]) - Initial/Assessment Note    Patient Details  Name: Logan Phillips MRN: [id number] Date of Birth: 26-Aug-1950  Transition of Care [facility]) CM/SW Contact:    Logan Phillips, LCSWA Phone Number: 01/31/2024, 2:35 PM  Clinical Narrative: Pt is from home with uncle and pt gave Pt gave CSW verbal permission to contact their daughter Logan Phillips.  Pt is independent with ADL's, has no DME, and Logan is the HCPOA. CSW inquired about paper work for [employer] and pt stated that there wasn't any. CSW inquired if they could place a consult for spiritual care for an advance directive, pt was agreeable. Consult placed.  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 Pt reports a good support system of family and friends. Pt denies hx of MH/SU.         CSW will continue to monitor.             Expected Discharge Plan: Home/Self Care Barriers to Discharge: Continued Medical Work up   Patient Goals and CMS Choice Patient states their goals for this hospitalization and ongoing recovery are:: Getting lungs fixed          Expected Discharge Plan and Services In-house Referral: Clinical Social Work     Living arrangements for the past 2 months: Single Family Home                                      Prior Living Arrangements/Services Living arrangements for the past 2 months: Single Family Home Lives with:: Adult Children Patient language and need for interpreter reviewed:: Yes Do you feel safe going back to the place where you live?: Yes      Need for Family Participation in Patient Care: Yes (Comment) Care giver support system in place?: Yes (comment)   Criminal Activity/Legal Involvement Pertinent to Current Situation/Hospitalization: No - Comment as needed  Activities of Daily Living   ADL Screening (condition at time of admission) Independently performs ADLs?: Yes (appropriate for developmental age) Is the patient deaf or have difficulty hearing?:  Yes (Family states HOH) Does the patient have difficulty seeing, even when wearing glasses/contacts?: No Does the patient have difficulty concentrating, remembering, or making decisions?: Yes  Permission Sought/Granted Permission sought to share information with : Family Supports Permission granted to share information with : Yes, Verbal Permission Granted  Share Information with NAME: Logan Phillips     Permission granted to share info w Relationship: Daughter  Permission granted to share info w Contact Information: [phone]  Emotional Assessment Appearance:: Appears stated age Attitude/Demeanor/Rapport: Engaged Affect (typically observed): Appropriate, Pleasant Orientation: : Oriented to Situation, Oriented to Self, Oriented to Place, Oriented to  Time Alcohol / Substance Use: Alcohol Use Psych Involvement: No (comment)  Admission diagnosis:  SIRS (systemic inflammatory response syndrome) (HCC) [R65.10] Severe sepsis (HCC) [A41.9, R65.20] Fever, unspecified fever cause [R50.9] Altered mental status, unspecified altered mental status type [R41.82] Pneumonia due to infectious organism, unspecified laterality, unspecified part of lung [J18.9] Patient Active Problem List   Diagnosis Date Noted   Severe sepsis (HCC) 01/28/2024   Brain aneurysm 01/28/2024   Labile hypertension 07/27/2023   Insomnia 07/19/2023   Low testosterone  07/19/2023   Hypogonadism in male 07/19/2023   Attention deficit 07/19/2023   Regular alcohol consumption 07/19/2023   Cavitary pneumonia 07/19/2023   Aspiration into airway 07/19/2023   Inhales drugs 07/19/2023  Other chronic pain 07/19/2023   Cyst of skin 05/16/2023   Chronic low back pain without sciatica 05/16/2023   Chronic fatigue 05/16/2023   Coronary artery disease involving native coronary artery of native heart without angina pectoris 08/04/2022   Chronic gout without tophus 08/04/2022   Screening for prostate cancer 08/04/2022   History of  brain tumor 08/04/2022   Dry eye 08/04/2022   Necrotizing pneumonia (HCC) 12/07/2021   Hyperkalemia    HLD (hyperlipidemia) 09/09/2018   Acute renal failure superimposed on stage 3 chronic kidney disease (HCC) 09/09/2018   Fall (on) (from) other stairs and steps, initial encounter 07/13/2018   Brain tumor (HCC) 05/09/2018   Schwannoma 04/18/2018   Acute ischemic colitis 04/09/2018   Arthritis 02/22/2018   Alcohol consumption heavy 02/22/2018   Gastroesophageal reflux disease 02/22/2018   Skin lesion 10/05/2017   Ataxia 10/05/2017   Dizziness 10/05/2017   Memory change 10/05/2017   Chronic right shoulder pain 03/18/2017   Screening for diabetes mellitus 10/04/2016   Vaccine counseling 10/04/2016   Encounter for health maintenance examination in adult 10/04/2016   Need for pneumococcal vaccination 10/04/2016   Lipoma of torso 10/04/2016   Hearing difficulty of both ears 10/04/2016   Varicose veins of left lower extremity    Essential hypertension 07/08/2014   Foot pain, left 04/14/2011   PCP:  Logan Alm RAMAN, PA-C Pharmacy:   [id number] - [place], [place] - [address] AT [facility] [address] & [facility] [address] [place] [address] Phone: [phone] Fax: [phone]  [id number] [place], [place] - [address] AT [facility] OF [facility] [address] & [facility] [address] [place] [address] Phone: [phone] Fax: [phone]  [facility] [address] [place], [place] - [address] AT [facility] [address] & [address] [place] [address] Phone: [phone] Fax: [phone]     Social Drivers of Health (SDOH) Social History: SDOH Screenings   Food Insecurity: No Food Insecurity (09/21/2022)  Housing: Low Risk  (09/21/2022)  Transportation Needs: No Transportation Needs (09/21/2022)  Utilities: Not At Risk (09/21/2022)  Alcohol Screen: Low Risk  (09/21/2022)  Depression (PHQ2-9):  Low Risk  (10/27/2023)  Financial Resource Strain: Low Risk  (09/21/2022)  Physical Activity: Sufficiently Active (09/21/2022)  Social Connections: Socially Isolated (01/29/2024)  Stress: No Stress Concern Present (09/21/2022)  Tobacco Use: Low Risk  (01/29/2024)   SDOH Interventions:     Readmission Risk Interventions     No data to display

## 2024-02-01 ENCOUNTER — Inpatient Hospital Stay (HOSPITAL_COMMUNITY)

## 2024-02-01 ENCOUNTER — Encounter (HOSPITAL_COMMUNITY): Payer: Self-pay | Admitting: Internal Medicine

## 2024-02-01 DIAGNOSIS — I4891 Unspecified atrial fibrillation: Secondary | ICD-10-CM

## 2024-02-01 DIAGNOSIS — I10 Essential (primary) hypertension: Secondary | ICD-10-CM | POA: Diagnosis not present

## 2024-02-01 DIAGNOSIS — E440 Moderate protein-calorie malnutrition: Secondary | ICD-10-CM | POA: Insufficient documentation

## 2024-02-01 DIAGNOSIS — I480 Paroxysmal atrial fibrillation: Secondary | ICD-10-CM | POA: Diagnosis not present

## 2024-02-01 DIAGNOSIS — R6520 Severe sepsis without septic shock: Secondary | ICD-10-CM | POA: Diagnosis not present

## 2024-02-01 DIAGNOSIS — A419 Sepsis, unspecified organism: Secondary | ICD-10-CM | POA: Diagnosis not present

## 2024-02-01 LAB — PHOSPHORUS: Phosphorus: 3.2 mg/dL (ref 2.5–4.6)

## 2024-02-01 LAB — CBC
HCT: 33.8 % — ABNORMAL LOW (ref 39.0–52.0)
Hemoglobin: 11.1 g/dL — ABNORMAL LOW (ref 13.0–17.0)
MCH: 25.8 pg — ABNORMAL LOW (ref 26.0–34.0)
MCHC: 32.8 g/dL (ref 30.0–36.0)
MCV: 78.6 fL — ABNORMAL LOW (ref 80.0–100.0)
Platelets: 220 K/uL (ref 150–400)
RBC: 4.3 MIL/uL (ref 4.22–5.81)
RDW: 13.9 % (ref 11.5–15.5)
WBC: 9.9 K/uL (ref 4.0–10.5)
nRBC: 0 % (ref 0.0–0.2)

## 2024-02-01 LAB — VITAMIN B12: Vitamin B-12: 332 pg/mL (ref 180–914)

## 2024-02-01 LAB — BASIC METABOLIC PANEL WITH GFR
Anion gap: 13 (ref 5–15)
BUN: 10 mg/dL (ref 8–23)
CO2: 26 mmol/L (ref 22–32)
Calcium: 8.2 mg/dL — ABNORMAL LOW (ref 8.9–10.3)
Chloride: 93 mmol/L — ABNORMAL LOW (ref 98–111)
Creatinine, Ser: 0.98 mg/dL (ref 0.61–1.24)
GFR, Estimated: >60 mL/min (ref >60)
Glucose, Bld: 114 mg/dL — ABNORMAL HIGH (ref 70–99)
Potassium: 3.7 mmol/L (ref 3.5–5.1)
Sodium: 132 mmol/L — ABNORMAL LOW (ref 135–145)

## 2024-02-01 LAB — RETICULOCYTES
Immature Retic Fract: 13.3 % (ref 2.3–15.9)
RBC.: 4.8 MIL/uL (ref 4.22–5.81)
Retic Count, Absolute: 41.8 K/uL (ref 19.0–186.0)
Retic Ct Pct: 0.9 % (ref 0.4–3.1)

## 2024-02-01 LAB — FERRITIN: Ferritin: 460 ng/mL — ABNORMAL HIGH (ref 24–336)

## 2024-02-01 LAB — IRON AND TIBC
Iron: 10 ug/dL — ABNORMAL LOW (ref 45–182)
Saturation Ratios: 5 % — ABNORMAL LOW (ref 17.9–39.5)
TIBC: 192 ug/dL — ABNORMAL LOW (ref 250–450)
UIBC: 182 ug/dL

## 2024-02-01 LAB — ECHOCARDIOGRAM COMPLETE
Area-P 1/2: 2.43 cm2
Height: 67 in
S' Lateral: 2.9 cm
Weight: 2201.07 [oz_av]

## 2024-02-01 LAB — VANCOMYCIN, RANDOM: Vancomycin Rm: 11 ug/mL

## 2024-02-01 LAB — FOLATE: Folate: >20 ng/mL (ref >5.9)

## 2024-02-01 MED ORDER — METOPROLOL TARTRATE 25 MG PO TABS
25.0000 mg | ORAL_TABLET | Freq: Two times a day (BID) | ORAL | Status: DC
  Administered 2024-02-02: 25 mg via ORAL
  Filled 2024-02-01: qty 1

## 2024-02-01 MED ORDER — METOPROLOL TARTRATE 5 MG/5ML IV SOLN
5.0000 mg | Freq: Once | INTRAVENOUS | Status: AC
  Administered 2024-02-01: 5 mg via INTRAVENOUS
  Filled 2024-02-01: qty 5

## 2024-02-01 MED ORDER — VANCOMYCIN HCL 1250 MG/250ML IV SOLN: 1250.0000 mg | INTRAVENOUS | Status: DC

## 2024-02-01 MED ORDER — DILTIAZEM LOAD VIA INFUSION
10.0000 mg | Freq: Once | INTRAVENOUS | Status: AC
  Administered 2024-02-01: 10 mg via INTRAVENOUS
  Filled 2024-02-01: qty 10

## 2024-02-01 MED ORDER — VANCOMYCIN HCL 1250 MG/250ML IV SOLN
1250.0000 mg | INTRAVENOUS | Status: DC
  Administered 2024-02-01 – 2024-02-02 (×2): 1250 mg via INTRAVENOUS
  Filled 2024-02-01 (×3): qty 250

## 2024-02-01 MED ORDER — MENTHOL 3 MG MT LOZG
1.0000 | LOZENGE | OROMUCOSAL | Status: DC | PRN
  Administered 2024-02-01: 3 mg via ORAL
  Filled 2024-02-01: qty 9

## 2024-02-01 MED ORDER — DILTIAZEM HCL-DEXTROSE 125-5 MG/125ML-% IV SOLN (PREMIX)
5.0000 mg/h | INTRAVENOUS | Status: DC
  Administered 2024-02-01 (×2): 5 mg/h via INTRAVENOUS
  Filled 2024-02-01 (×2): qty 125

## 2024-02-01 NOTE — Progress Notes (Signed)
 PROGRESS NOTE  Logan Phillips FMW:[id number] DOB: 1950/03/28 DOA: 01/28/2024 PCP: Bulah Alm RAMAN, PA-C   LOS: 4 days   Brief Narrative / Interim history: 73 year old male with history of lipoid/necrotizing pneumonia, HTN, gout, EtOH use who presented to the hospital with confusion.  He apparently was playing golf with his friends and suddenly became confused.  He was found to be febrile to 102.4 on admission, tachycardic, had a leukocytosis and imaging was concerning for worsening pneumonia.  He was placed on antibiotics and admitted to the hospital.  COVID, influenza, RSV all negative.  Hospital course complicated by new onset A-fib with RVR 11/19  Subjective / 24h Interval events: Rapid response called this morning, evaluated patient at 7:00 at bedside.  He is in A-fib with RVR with rates between 180 and 200.  He currently complains of pleuritic type chest pain on the left side, and has been going on for several weeks.  He denies any substernal chest pressure.  Denies any palpitations.  He has no nausea or vomiting.  Assesement and Plan: Principal problem Severe sepsis -patient presented to the hospital with high-grade fever, tachycardia, tachypnea, elevated white count.  CT scan of the chest showed known history of lipoid pneumonia, slightly progressed since prior study.  Suspect superimposed infection, started on antibiotics, will continue for now - COVID, influenza, RSV all negative - Sepsis physiology overall improving  Active problems Paroxysmal A-fib / flutter with RVR -new onset this morning, he is relatively asymptomatic.  I evaluated the patient stat at bedside along with rapid response team.  Status post IV and p.o. metoprolol , rates not improved, switch to Cardizem infusion, rates are now better.  Cards consulted, appreciate input  History of recurrent lipoid pneumonia - History of recurrent  pneumonia pneumonia. Follows with Dr Meade. Productive cough over past 1 year no  dyspnea.  Works as a administrator, public affairs consultant .  Never smoked.  Currently on room air.  Speech therapy evaluated the patient and recommended regular diet.  We recommend to follow-up with Dr. Meade, pulmonology as an outpatient after discharge.   Hyponatremia -sodium overall stable, no large shifts noted   Acute metabolic encephalopathy - In the setting of severe sepsis.  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  Case was discussed with neurology.  MRI did not show any acute intracranial abnormalities.   Alcohol use - Drinks 1-2 beers or vodka about 4 times a week.  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  Case discussed with neurology.  Recommend outpatient follow-up with neurosurgery.  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   Hypertension - Currently blood pressure well-controlled.  Continue current medications   Gout - Continue allopurinol   Hypophosphatemia -continue to monitor and supplement as indicated   Poor oral intake/malnutrition - Mentioned that he has been eating poorly and losing weight since last few months.  We consulted dietitian   Deconditioning/debility - PT consulted   Scheduled Meds:  allopurinol  100 mg Oral Daily   Chlorhexidine  Gluconate Cloth  6 each Topical Daily   enoxaparin  (LOVENOX ) injection  40 mg Subcutaneous Daily   feeding supplement  237 mL Oral BID BM   folic acid   1 mg Oral Daily   guaiFENesin  600 mg Oral BID   multivitamin with minerals  1 tablet Oral  Daily   mupirocin ointment  1 Application Nasal BID   phosphorus  250 mg Oral TID   rosuvastatin   10 mg Oral Daily   thiamine   100 mg Oral Daily   Or   thiamine   100 mg Intravenous Daily   Continuous Infusions:  azithromycin  500 mg (01/31/24 1833)   cefTRIAXone (ROCEPHIN)  IV 2 g (01/31/24 1702)    diltiazem (CARDIZEM) infusion 5 mg/hr (02/01/24 0839)   vancomycin      PRN Meds:.acetaminophen  **OR** acetaminophen , albuterol , ondansetron  **OR** ondansetron  (ZOFRAN ) IV, mouth rinse, oxyCODONE , polyethylene glycol  Current Outpatient Medications  Medication Instructions   acetaminophen  (TYLENOL ) 1,000 mg, Oral, Every 6 hours PRN   albuterol  (PROVENTIL ) 2.5 mg, Nebulization, Every 6 hours PRN   albuterol  (VENTOLIN  HFA) 108 (90 Base) MCG/ACT inhaler 2 puffs, Inhalation, Every 6 hours PRN   allopurinol (ZYLOPRIM) 100 mg, Oral, Daily   amphetamine -dextroamphetamine  (ADDERALL) 10 MG tablet 10 mg, Oral, Daily with breakfast   docusate sodium  (COLACE) 100 mg, Oral, 2 times daily   lisinopril  (ZESTRIL ) 2.5 MG tablet TAKE 1 TABLET(2.5 MG) BY MOUTH IN THE MORNING AND AT BEDTIME   meloxicam  (MOBIC ) 15 MG tablet TAKE 1 TABLET(15 MG) BY MOUTH DAILY   metoprolol  tartrate (LOPRESSOR ) 100 mg, Daily   Multiple Vitamin (MULITIVITAMIN WITH MINERALS) TABS 1 tablet, Daily   Oxycodone  HCl 10 mg, Oral, 5 times daily   rosuvastatin  (CRESTOR ) 10 mg, Oral, Daily   Testosterone  25 MG/2.5GM (1%) GEL 2 Pump, Topical, Daily    Diet Orders (From admission, onward)     Start     Ordered   01/31/24 1515  Diet regular Room service appropriate? Yes; Fluid consistency: Thin  Diet effective now       Question Answer Comment  Room service appropriate? Yes   Fluid consistency: Thin      01/31/24 1514            DVT prophylaxis: enoxaparin  (LOVENOX ) injection 40 mg Start: 01/29/24 1000   Lab Results  Component Value Date   PLT 220 02/01/2024      Code Status: Full Code  Family Communication: No family at bedside  Status is: Inpatient Remains inpatient appropriate because: Severity of illness  Level of care: Progressive  Consultants:  Cardiology  Objective: Vitals:   02/01/24 0844 02/01/24 0900 02/01/24 0912 02/01/24 0950  BP: 132/87 (!) 114/98 (!) 123/93 119/84  Pulse:  81 85 86  Resp: (!)  26     Temp:      TempSrc:      SpO2:  94% 94% 95%  Weight:      Height:        Intake/Output Summary (Last 24 hours) at 02/01/2024 1024 Last data filed at 02/01/2024 0750 Gross per 24 hour  Intake 240 ml  Output 0 ml  Net 240 ml   Wt Readings from Last 3 Encounters:  01/29/24 62.4 kg  11/21/23 61.1 kg  10/27/23 63.8 kg    Examination:  Constitutional: NAD Eyes: no scleral icterus ENMT: Mucous membranes are moist.  Neck: normal, supple Respiratory: Diminished at the bases, no wheezing Cardiovascular: Tachycardic, irregular Abdomen: non distended, no tenderness. Bowel sounds positive.  Musculoskeletal: no clubbing / cyanosis.   Data Reviewed: I have independently reviewed following labs and imaging studies   CBC Recent Labs  Lab 01/28/24 1723 01/28/24 1747 01/29/24 0158 01/30/24 0425 02/01/24 0401  WBC 13.1*  --  14.7* 9.5 9.9  HGB 11.4* 12.2* 10.3* 9.5* 11.1*  HCT 34.4* 36.0*  31.1* 28.9* 33.8*  PLT 218  --  178 163 220  MCV 79.8*  --  79.3* 79.8* 78.6*  MCH 26.5  --  26.3 26.2 25.8*  MCHC 33.1  --  33.1 32.9 32.8  RDW 14.0  --  14.1 13.9 13.9  LYMPHSABS 0.6*  --   --  0.7  --   MONOABS 0.5  --   --  0.5  --   EOSABS 0.0  --   --  0.1  --   BASOSABS 0.0  --   --  0.0  --     Recent Labs  Lab 01/28/24 1723 01/28/24 1744 01/28/24 1747 01/29/24 0158 01/30/24 0425 01/30/24 0426 02/01/24 0401  NA 130*  --  130* 131*  --  135 132*  K 4.6  --  4.6 4.1  --  4.1 3.7  CL 94*  --   --  96*  --  101 93*  CO2 24  --   --  23  --  24 26  GLUCOSE 121*  --   --  110*  --  99 114*  BUN 23  --   --  19  --  15 10  CREATININE 1.47*  --   --  1.29*  --  1.21 0.98  CALCIUM  9.4  --   --  8.7*  --  8.1* 8.2*  AST 17  --   --   --   --   --   --   ALT 10  --   --   --   --   --   --   ALKPHOS 70  --   --   --   --   --   --   BILITOT 1.0  --   --   --   --   --   --   ALBUMIN 3.8  --   --   --   --  2.4*  --   MG  --   --   --  1.9 2.0  --   --   PROCALCITON  --    --   --  2.33  --   --   --   LATICACIDVEN  --  1.0  --   --   --   --   --   INR 1.2  --   --   --   --   --   --   AMMONIA 23  --   --   --   --   --   --     ------------------------------------------------------------------------------------------------------------------ No results for input(s): CHOL, HDL, LDLCALC, TRIG, CHOLHDL, LDLDIRECT in the last 72 hours.  Lab Results  Component Value Date   HGBA1C 4.8 10/05/2017   ------------------------------------------------------------------------------------------------------------------ No results for input(s): TSH, T4TOTAL, T3FREE, THYROIDAB in the last 72 hours.  Invalid input(s): FREET3  Cardiac Enzymes No results for input(s): CKMB, TROPONINI, MYOGLOBIN in the last 168 hours.  Invalid input(s): CK ------------------------------------------------------------------------------------------------------------------    Component Value Date/Time   BNP 35.1 12/07/2021 2149    CBG: Recent Labs  Lab 01/28/24 1540 01/31/24 2232  GLUCAP 134* 117*    Recent Results (from the past 240 hours)  Resp panel by RT-PCR (RSV, Flu A&B, Covid) Anterior Nasal Swab     Status: None   Collection Time: 01/28/24  3:56 PM   Specimen: Anterior Nasal Swab  Result Value Ref Range Status   SARS Coronavirus 2 by RT PCR NEGATIVE  NEGATIVE Final   Influenza A by PCR NEGATIVE NEGATIVE Final   Influenza B by PCR NEGATIVE NEGATIVE Final    Comment: (NOTE) The Xpert Xpress SARS-CoV-2/FLU/RSV plus assay is intended as an aid in the diagnosis of influenza from Nasopharyngeal swab specimens and should not be used as a sole basis for treatment. Nasal washings and aspirates are unacceptable for Xpert Xpress SARS-CoV-2/FLU/RSV testing.  Fact Sheet for Patients: bloggercourse.com  Fact Sheet for Healthcare Providers: seriousbroker.it  This test is not yet approved or  cleared by the [place]  FDA and has been authorized for detection and/or diagnosis of SARS-CoV-2 by FDA under an Emergency Use Authorization (EUA). This EUA will remain in effect (meaning this test can be used) for the duration of the COVID-19 declaration under Section 564(b)(1) of the Act, 21 U.S.C. section 360bbb-3(b)(1), unless the authorization is terminated or revoked.     Resp Syncytial Virus by PCR NEGATIVE NEGATIVE Final    Comment: (NOTE) Fact Sheet for Patients: bloggercourse.com  Fact Sheet for Healthcare Providers: seriousbroker.it  This test is not yet approved or cleared by the [place]  FDA and has been authorized for detection and/or diagnosis of SARS-CoV-2 by FDA under an Emergency Use Authorization (EUA). This EUA will remain in effect (meaning this test can be used) for the duration of the COVID-19 declaration under Section 564(b)(1) of the Act, 21 U.S.C. section 360bbb-3(b)(1), unless the authorization is terminated or revoked.  Performed at [facility], [address], [place], [place] [address]   Culture, blood (Routine x 2)     Status: None (Preliminary result)   Collection Time: 01/28/24  5:20 PM   Specimen: BLOOD LEFT ARM  Result Value Ref Range Status   Specimen Description BLOOD LEFT ARM  Final   Special Requests   Final    BOTTLES DRAWN AEROBIC AND ANAEROBIC Blood Culture adequate volume   Culture   Final    NO GROWTH 4 DAYS Performed at [facility], [address] [place], [place] [address]    Report Status PENDING  Incomplete  Culture, blood (Routine x 2)     Status: None (Preliminary result)   Collection Time: 01/28/24  5:52 PM   Specimen: BLOOD LEFT ARM  Result Value Ref Range Status   Specimen Description BLOOD LEFT ARM  Final   Special Requests   Final    BOTTLES DRAWN AEROBIC ONLY Blood Culture results may not be optimal due to an inadequate volume of blood  received in culture bottles   Culture   Final    NO GROWTH 4 DAYS Performed at [facility], [address] [place], [place] [address]    Report Status PENDING  Incomplete  Respiratory (~20 pathogens) panel by PCR     Status: None   Collection Time: 01/28/24  8:17 PM   Specimen: Nasopharyngeal Swab; Respiratory  Result Value Ref Range Status   Adenovirus NOT DETECTED NOT DETECTED Final   Coronavirus 229E NOT DETECTED NOT DETECTED Final    Comment: (NOTE) The Coronavirus on the Respiratory Panel, DOES NOT test for the novel  Coronavirus (2019 nCoV)    Coronavirus HKU1 NOT DETECTED NOT DETECTED Final   Coronavirus NL63 NOT DETECTED NOT DETECTED Final   Coronavirus OC43 NOT DETECTED NOT DETECTED Final   Metapneumovirus NOT DETECTED NOT DETECTED Final   Rhinovirus / Enterovirus NOT DETECTED NOT DETECTED Final   Influenza A NOT DETECTED NOT DETECTED Final   Influenza B NOT DETECTED NOT  DETECTED Final   Parainfluenza Virus 1 NOT DETECTED NOT DETECTED Final   Parainfluenza Virus 2 NOT DETECTED NOT DETECTED Final   Parainfluenza Virus 3 NOT DETECTED NOT DETECTED Final   Parainfluenza Virus 4 NOT DETECTED NOT DETECTED Final   Respiratory Syncytial Virus NOT DETECTED NOT DETECTED Final   Bordetella pertussis NOT DETECTED NOT DETECTED Final   Bordetella Parapertussis NOT DETECTED NOT DETECTED Final   Chlamydophila pneumoniae NOT DETECTED NOT DETECTED Final   Mycoplasma pneumoniae NOT DETECTED NOT DETECTED Final    Comment: Performed at [facility], [address] [place], [place] [address]  Urine Culture (for pregnant, neutropenic or urologic patients or patients with an indwelling urinary catheter)     Status: None   Collection Time: 01/28/24 10:10 PM   Specimen: Urine, Clean Catch  Result Value Ref Range Status   Specimen Description URINE, CLEAN CATCH  Final   Special Requests NONE  Final   Culture   Final    NO GROWTH Performed at [facility], [address], [place], [place] [address]    Report Status 01/30/2024 FINAL  Final  MRSA Next Gen by PCR, Nasal     Status: Abnormal   Collection Time: 01/31/24  8:12 AM   Specimen: Nasal Mucosa; Nasal Swab  Result Value Ref Range Status   MRSA by PCR Next Gen DETECTED (A) NOT DETECTED Final    Comment: RESULT CALLED TO, READ BACK BY AND VERIFIED WITH: RN J.NARAMDAS AT 1254 ON 01/31/2024 BY T.SAAD. (NOTE) The GeneXpert MRSA Assay (FDA approved for NASAL specimens only), is one component of a comprehensive MRSA colonization surveillance program. It is not intended to diagnose MRSA infection nor to guide or monitor treatment for MRSA infections. Test performance is not FDA approved in patients less than 19 years old. Performed at [facility], [address] [place], [place] [address]     Radiology Studies: No results found.  CRITICAL CARE Performed by: Nilda Fendt   Total critical care time: 40 minutes  Critical care time was exclusive of separately billable procedures and treating other patients.  Critical care was necessary to treat or prevent imminent or life-threatening deterioration.  Critical care was time spent personally by me on the following activities: bedside evaluation of new onset A fib with RVR with rates up to 200, evaluate hemodynamic stability, attempted BB with no significant the rate, transition to Cardizem  continuous IV infusion, development of treatment plan with patient and/or surrogate as well as nursing, discussions with cardiology consultants, evaluation of patient's response to treatment, examination of patient, obtaining history from patient or surrogate, ordering and performing treatments and interventions, ordering and review of laboratory studies, ordering and review of radiographic studies, pulse oximetry and re-evaluation of patient's condition.   Nilda Fendt, MD, PhD [facility]  Between 7 am - 7 pm I am available, please contact me via  Amion (for emergencies) or Securechat (non urgent messages)  Between 7 pm - 7 am I am not available, please contact night coverage MD/APP via Amion

## 2024-02-01 NOTE — Consult Note (Signed)
 Cardiology Consultation   Patient ID: YAHEL FUSTON MRN: [id number]; DOB: 06/09/1950  Admit date: 01/28/2024 Date of Consult: 02/01/2024  PCP:  Bulah Alm RAMAN, PA-C   [facility] Providers Cardiologist:  Dr Ronal Ross   Patient Profile: Logan Phillips is a 73 y.o. male with a hx of hypertension, hyperlipidemia, gout admitted with confusion and found to have sepsis/pneumonia who is being seen 02/01/2024 for the evaluation of atrial fibrillation/atrial flutter at the request of Nilda Fendt MD.  History of Present Illness: Cardiac catheterization 1995 showed no coronary disease.  Echocardiogram July 2024 showed hypokinesis of the posterior lateral wall with overall preserved LV function.  Nuclear study August 2024 showed normal LV function with no ischemia or infarction.  Patient [place] 15 [place]  He was found to be febrile and ultimately to have lipoid pneumonia.  MRI showed no significant brain abnormalities.  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  He has improved with antibiotics.  This morning he was found to have paroxysmal atrial fibrillation as well as flutter on telemetry with rapid ventricular spots.  Cardiology now asked to evaluate.  He denies dyspnea, chest pain, palpitations or syncope.   Past Medical History:  Diagnosis Date   Chronic pain in shoulder 2018   [facility]   H/O echocardiogram 10/08/2016   EF 65-70%, normal wall motion, systolic function vigorous   Hearing loss    pending hearing aids 06/2014   Hyperlipidemia    Hypertension    Lipoma    Pneumonia    Varicose vein    Vestibular schwannoma (HCC) 04/2018    Past Surgical History:  Procedure Laterality Date   APPENDECTOMY     APPLICATION OF CRANIAL NAVIGATION Right 05/09/2018   Procedure: APPLICATION OF CRANIAL NAVIGATION;  Surgeon: Lanis Pupa, MD;  Location: [facility] OR;  Service: Neurosurgery;  Laterality:  Right;   BIOPSY  04/12/2018   Procedure: BIOPSY;  Surgeon: Kristie Lamprey, MD;  Location: [facility] ENDOSCOPY;  Service: Endoscopy;;   COLONOSCOPY     age 17, he did not f/u with referral 2018   COLONOSCOPY WITH PROPOFOL  N/A 04/12/2018   Procedure: COLONOSCOPY WITH PROPOFOL ;  Surgeon: Kristie Lamprey, MD;  Location: [facility] ENDOSCOPY;  Service: Endoscopy;  Laterality: N/A;   CRANIOTOMY Right 05/09/2018   Procedure: Right retrosigmoid craniectomy, resection of acoustic neuroma with intraoperative facial monitoring/Brain Lab;  Surgeon: Lanis Pupa, MD;  Location: [facility] OR;  Service: Neurosurgery;  Laterality: Right;   LUMBAR DISC SURGERY  2002   Dr. Mora   VIDEO BRONCHOSCOPY N/A 09/06/2023   Procedure: BRONCHOSCOPY, WITH FLUOROSCOPY;  Surgeon: Meade Verdon RAMAN, MD;  Location: [facility] ENDOSCOPY;  Service: Pulmonary;  Laterality: N/A;   WISDOM TOOTH EXTRACTION         Scheduled Meds:  allopurinol  100 mg Oral Daily   Chlorhexidine  Gluconate Cloth  6 each Topical Daily   enoxaparin  (LOVENOX ) injection  40 mg Subcutaneous Daily   feeding supplement  237 mL Oral BID BM   folic acid   1 mg Oral Daily   guaiFENesin  600 mg Oral BID   multivitamin with minerals  1 tablet Oral Daily   mupirocin ointment  1 Application Nasal BID   phosphorus  250 mg Oral TID   rosuvastatin   10 mg Oral Daily   thiamine   100 mg Oral Daily   Or   thiamine   100 mg Intravenous Daily   Continuous Infusions:  azithromycin  500  mg (01/31/24 1833)   cefTRIAXone (ROCEPHIN)  IV 2 g (01/31/24 1702)   diltiazem (CARDIZEM) infusion 5 mg/hr (02/01/24 0839)   vancomycin      PRN Meds: acetaminophen  **OR** acetaminophen , albuterol , ondansetron  **OR** ondansetron  (ZOFRAN ) IV, mouth rinse, oxyCODONE , polyethylene glycol  Allergies:   No Known Allergies  Social History:   Social History   Socioeconomic History   Marital status: Divorced    Spouse name: Not on file   Number of children: 3   Years of education: Not on file   Highest  education level: Not on file  Occupational History   Not on file  Tobacco Use   Smoking status: Never    Passive exposure: Past   Smokeless tobacco: Never  Vaping Use   Vaping status: Never Used  Substance and Sexual Activity   Alcohol use: Yes    Alcohol/week: 7.0 standard drinks of alcohol    Types: 7 Shots of liquor per week    Comment: heavier prior   Drug use: No   Sexual activity: Not on file  Other Topics Concern   Not on file  Social History Narrative   Lives with handicap room mate.  Walking, single, 3 children, 3 girls. 11/2023   Social Drivers of Health   Financial Resource Strain: Low Risk  (09/21/2022)   Overall Financial Resource Strain (CARDIA)    Difficulty of Paying Living Expenses: Not hard at all  Food Insecurity: No Food Insecurity (09/21/2022)   Hunger Vital Sign    Worried About Running Out of Food in the Last Year: Never true    Ran Out of Food in the Last Year: Never true  Transportation Needs: No Transportation Needs (09/21/2022)   PRAPARE - Administrator, Civil Service (Medical): No    Lack of Transportation (Non-Medical): No  Physical Activity: Sufficiently Active (09/21/2022)   Exercise Vital Sign    Days of Exercise per Week: 4 days    Minutes of Exercise per Session: 60 min  Stress: No Stress Concern Present (09/21/2022)   [employer] of Occupational Health - Occupational Stress Questionnaire    Feeling of Stress : Not at all  Social Connections: Socially Isolated (01/29/2024)   Social Connection and Isolation Panel    Frequency of Communication with Friends and Family: More than three times a week    Frequency of Social Gatherings with Friends and Family: Three times a week    Attends Religious Services: Never    Active Member of Clubs or Organizations: No    Attends Banker Meetings: Never    Marital Status: Divorced  Catering Manager Violence: Not At Risk (09/21/2022)   Humiliation, Afraid, Rape, and Kick  questionnaire    Fear of Current or Ex-Partner: No    Emotionally Abused: No    Physically Abused: No    Sexually Abused: No    Family History:    Family History  Problem Relation Age of Onset   Hypertension Mother    Dementia Father    Parkinsonism Father    Diabetes Father        early stages   Cancer Maternal Grandmother        breast   Heart disease Neg Hx    Stroke Neg Hx      ROS:  Please see the history of present illness.  Recent altered mental status, fever and now with pain in left chest with inspiration. All other ROS reviewed and negative.     Physical  Exam/Data: Vitals:   02/01/24 0900 02/01/24 0912 02/01/24 0945 02/01/24 0950  BP: (!) 114/98 (!) 123/93  119/84  Pulse: 81 85  86  Resp:   19   Temp:      TempSrc:      SpO2: 94% 94%  95%  Weight:      Height:        Intake/Output Summary (Last 24 hours) at 02/01/2024 1033 Last data filed at 02/01/2024 0750 Gross per 24 hour  Intake 240 ml  Output 0 ml  Net 240 ml      01/29/2024    1:13 PM 01/28/2024    3:36 PM 11/21/2023   11:34 AM  Last 3 Weights  Weight (lbs) 137 lb 9.1 oz 150 lb 134 lb 12.8 oz  Weight (kg) 62.4 kg 68.04 kg 61.145 kg     Body mass index is 21.55 kg/m.  General:  Well nourished, well developed, in no acute distress HEENT: normal Neck: no JVD Vascular: No carotid bruits; Distal pulses 2+ bilaterally Cardiac:  normal S1, S2; RRR; no murmur  Lungs: Diminished breath sounds bases. Abd: soft, nontender, no hepatomegaly  Ext: no edema Musculoskeletal:  No deformities, BUE and BLE strength normal and equal Skin: warm and dry  Neuro:  CNs 2-12 intact, no focal abnormalities noted Psych:  Normal affect   EKG:  The EKG was personally reviewed and demonstrates: Atrial fibrillation with PVCs or aberrantly conducted beats, rapid ventricular response, nonspecific ST changes. Telemetry:  Telemetry was personally reviewed and demonstrates: Paroxysmal atrial  fibrillation/flutter.   Laboratory Data:  Chemistry Recent Labs  Lab 01/29/24 0158 01/30/24 0425 01/30/24 0426 02/01/24 0401  NA 131*  --  135 132*  K 4.1  --  4.1 3.7  CL 96*  --  101 93*  CO2 23  --  24 26  GLUCOSE 110*  --  99 114*  BUN 19  --  15 10  CREATININE 1.29*  --  1.21 0.98  CALCIUM  8.7*  --  8.1* 8.2*  MG 1.9 2.0  --   --   GFRNONAA 59*  --  >60 >60  ANIONGAP 12  --  10 13    Recent Labs  Lab 01/28/24 1723 01/30/24 0426  PROT 7.9  --   ALBUMIN 3.8 2.4*  AST 17  --   ALT 10  --   ALKPHOS 70  --   BILITOT 1.0  --    Hematology Recent Labs  Lab 01/29/24 0158 01/30/24 0425 02/01/24 0401  WBC 14.7* 9.5 9.9  RBC 3.92* 3.62* 4.30  HGB 10.3* 9.5* 11.1*  HCT 31.1* 28.9* 33.8*  MCV 79.3* 79.8* 78.6*  MCH 26.3 26.2 25.8*  MCHC 33.1 32.9 32.8  RDW 14.1 13.9 13.9  PLT 178 163 220    Radiology/Studies:  MR Brain W and Wo Contrast Result Date: 01/28/2024 EXAM: MRI BRAIN WITH AND WITHOUT CONTRAST 01/28/2024 10:43:03 PM TECHNIQUE: Multiplanar multisequence MRI of the head/brain was performed with and without the administration of intravenous contrast. COMPARISON: None available. CLINICAL HISTORY: Headache, neuro deficit; Fever, stroke FINDINGS: BRAIN AND VENTRICLES: No acute infarct. No acute intracranial hemorrhage. No mass effect or midline shift. No hydrocephalus. Similar postsurgical changes in the region of the right cerebellopontine angle with similar extra-axial fluid collection causing mild mass effect in the right cerebellum. Similar mild linear enhancement in the right internal auditory canal and pars acusticus. No masslike enhancement. Normal flow voids. ORBITS: No acute abnormality. SINUSES: No acute abnormality. BONES AND  SOFT TISSUES: Normal bone marrow signal and enhancement. No acute soft tissue abnormality. IMPRESSION: 1. No acute intracranial abnormality. 2. Similar right cerebellopontine angle postoperative changes as detailed above.  Electronically signed by: Gilmore Molt MD 01/28/2024 11:11 PM EST RP Workstation: [id number]   CT ANGIO HEAD NECK W WO CM Result Date: 01/28/2024 EXAM: CTA HEAD AND NECK WITH AND WITHOUT 01/28/2024 07:23:00 PM TECHNIQUE: CTA of the head and neck was performed with and without the administration of intravenous contrast. Multiplanar 2D and/or 3D reformatted images are provided for review. Automated exposure control, iterative reconstruction, and/or weight based adjustment of the mA/kV was utilized to reduce the radiation dose to as low as reasonably achievable. Stenosis of the internal carotid arteries measured using NASCET criteria. COMPARISON: None available CLINICAL HISTORY: Neuro deficit, acute, stroke suspected FINDINGS: AORTIC ARCH AND ARCH VESSELS: No dissection or arterial injury. No significant stenosis of the brachiocephalic or subclavian arteries. CERVICAL CAROTID ARTERIES: No dissection, arterial injury, or hemodynamically significant stenosis by NASCET criteria. CERVICAL VERTEBRAL ARTERIES: No dissection, arterial injury, or significant stenosis. LUNGS AND MEDIASTINUM: Unremarkable. SOFT TISSUES: No acute abnormality. BONES: No acute abnormality. ANTERIOR CIRCULATION: No significant stenosis of the internal carotid arteries. No significant stenosis of the anterior cerebral arteries. No significant stenosis of the middle cerebral arteries. Approximately 3-4 mm basilar tip aneurysm. POSTERIOR CIRCULATION: No significant stenosis of the posterior cerebral arteries. No significant stenosis of the basilar artery. No significant stenosis of the vertebral arteries. No aneurysm. OTHER: No dural venous sinus thrombosis on this non-dedicated study. IMPRESSION: 1. Approximately 3-4 mm basilar tip aneurysm. 2. No large vessel occlusion or hemodynamically significant stenosis. Electronically signed by: Gilmore Molt MD 01/28/2024 07:46 PM EST RP Workstation: [id number]   CT CHEST ABDOMEN PELVIS W  CONTRAST Result Date: 01/28/2024 CLINICAL DATA:  Sepsis, neurologic deficit EXAM: CT CHEST, ABDOMEN, AND PELVIS WITH CONTRAST TECHNIQUE: Multidetector CT imaging of the chest, abdomen and pelvis was performed following the standard protocol during bolus administration of intravenous contrast. RADIATION DOSE REDUCTION: This exam was performed according to the departmental dose-optimization program which includes automated exposure control, adjustment of the mA and/or kV according to patient size and/or use of iterative reconstruction technique. CONTRAST:  75mL OMNIPAQUE  IOHEXOL  350 MG/ML SOLN COMPARISON:  08/10/2023 FINDINGS: CT CHEST FINDINGS Cardiovascular: The heart is unremarkable without pericardial effusion. No evidence of thoracic aortic aneurysm or dissection. Mediastinum/Nodes: Stable mediastinal lymphadenopathy, with largest lymph node in the pretracheal region measuring 12 mm in short axis. Thyroid , trachea, and esophagus are unremarkable. Lungs/Pleura: Bilateral areas of cavitating pneumonia are again identified, most pronounced in the left lower lobe. Overall, there has been mild progression since prior study. The areas of lung consolidation demonstrate low attenuation, compatible with findings of lipoid pneumonia noted on recent bronchoscopy 09/06/2023. Trace right pleural effusion. No pneumothorax. Musculoskeletal: No acute or destructive bony abnormalities. Reconstructed images demonstrate no additional findings. CT ABDOMEN PELVIS FINDINGS Hepatobiliary: No focal liver abnormality is seen. No gallstones, gallbladder wall thickening, or biliary dilatation. Pancreas: Unremarkable. No pancreatic ductal dilatation or surrounding inflammatory changes. Spleen: Normal in size without focal abnormality. Adrenals/Urinary Tract: Simple appearing right renal cortical cyst does not require specific imaging follow-up. Otherwise the kidneys are unremarkable with no urinary tract calculi or obstructive uropathy.  The adrenals and bladder are grossly normal. Stomach/Bowel: No bowel obstruction or ileus. No bowel wall thickening or inflammatory change. Vascular/Lymphatic: Aortic atherosclerosis. No enlarged abdominal or pelvic lymph nodes. Reproductive: Prostate is unremarkable. Other: No free fluid or free intraperitoneal gas. No  abdominal wall hernia. Musculoskeletal: No acute or destructive bony abnormalities. Reconstructed images demonstrate no additional findings. IMPRESSION: 1. Low-attenuation consolidation within the bilateral lungs with superimposed areas of cavitation, compatible with known history of the lipoid pneumonia. Slight progression since prior study. 2. Stable mediastinal lymphadenopathy. 3. Trace right pleural effusion. 4. No acute intra-abdominal or intrapelvic process. 5.  Aortic Atherosclerosis (ICD10-I70.0). Electronically Signed   By: Ozell Daring M.D.   On: 01/28/2024 19:44   CT Head Wo Contrast Result Date: 01/28/2024 CLINICAL DATA:  Altered level of consciousness, confusion EXAM: CT HEAD WITHOUT CONTRAST TECHNIQUE: Contiguous axial images were obtained from the base of the skull through the vertex without intravenous contrast. RADIATION DOSE REDUCTION: This exam was performed according to the departmental dose-optimization program which includes automated exposure control, adjustment of the mA and/or kV according to patient size and/or use of iterative reconstruction technique. COMPARISON:  09/08/2018 FINDINGS: Brain: There is hypodensity within the inferior right occipital cortex, reference image 11/3 and image 12/5, consistent with age-indeterminate infarct. No evidence of acute hemorrhage. Lateral ventricles and midline structures are unremarkable. No acute extra-axial fluid collections. No mass effect. Vascular: No hyperdense vessel or unexpected calcification. Skull: Postsurgical changes from prior right occipital craniectomy. The remainder of the calvarium is unremarkable.  Sinuses/Orbits: Mild mucosal thickening within the left sphenoid sinus. Remaining paranasal sinuses are clear. Other: None. IMPRESSION: 1. Cortical hypodensity within the inferior right occipital lobe, which could reflect age indeterminate cortical infarct. Further evaluation with MRI may be useful. 2. No evidence of acute hemorrhage. 3. Postsurgical changes from prior right-sided acoustic neuroma resection. These results were called by telephone at the time of interpretation on 01/28/2024 at 5:03 pm to provider [facility] , who verbally acknowledged these results. Electronically Signed   By: Ozell Daring M.D.   On: 01/28/2024 17:21   DG Chest 2 View if patient is not in a treatment room. Result Date: 01/28/2024 CLINICAL DATA:  Possible sepsis.  Confusion. EXAM: CHEST - 2 VIEW COMPARISON:  09/06/2023, 02/28/2023 and chest CT 08/02/2023 FINDINGS: Lungs are adequately inflated demonstrate multifocal airspace density over the left mid to lower lung and right base as similar findings are seen on previous exams. Patient underwent bronchoscopy June 2025 with biopsy demonstrating lipoid pneumonia. No significant effusion or pneumothorax. Cardiomediastinal silhouette and remainder of the exam is unchanged. IMPRESSION: Multifocal airspace process over the left mid to lower lung and right base as similar findings are seen on previous exams. Previous bronchoscopy with biopsy June 2025 demonstrates lipoid pneumonia. Electronically Signed   By: Toribio Agreste M.D.   On: 01/28/2024 16:33     Assessment and Plan: Paroxysmal atrial fibrillation/flutter-patient is back in sinus rhythm.  Question if atrial arrhythmias are related to the hyperadrenergic state associated with ongoing pneumonia.  Patient had recent TSH in September that was normal.  Agree with repeat echocardiogram.  Will continue IV Cardizem for now.  Will also add low-dose metoprolol  and advance as tolerated.  CHA2DS2-VASc is 2 for age greater than 4 and  hypertension.  However issue of anticoagulation is somewhat complicated.  He is noted to have a mild microcytic anemia and has had occasional hematochezia in the past though none recently by his report.  He also has a history of alcohol use and a small intracerebral aneurysm.  For now we will not anticoagulate and follow closely on telemetry.  If he has more frequent episodes then would need to reassess.  Would then plan outpatient monitor to evaluate burden. Lipoid  pneumonia/sepsis-patient is on antibiotics for potential superimposed infection.  He is improving. Hypertension-continue Cardizem and metoprolol  for rate control which will also treat hypertension.  Follow and adjust as needed. History of alcohol use-patient admits to 5-7 alcoholic beverages weekly. Metabolic encephalopathy-improved following treatment of pneumonia. History of intracerebral aneurysm-follow-up neurosurgery as an outpatient.   For questions or updates, please contact [facility] Please consult [email or web address] for contact info under   Signed, Redell Shallow, MD  02/01/2024 10:33 AM

## 2024-02-01 NOTE — Progress Notes (Signed)
 HR on tele monitor noted to be up to 180's.  EKG done and showed Afib with RVR.  Dr. Trixie at bedside.  Pt's only c/o is left chest/flank pain which is r/t his pneumonia.  Per MD order, pt received his AM dose of Metoprolol  100mg  po and Metoprolol  5mg  IV.  Plan to transfer to progressive care for further treatment.  Report given to oncoming RN, Jamie.    02/01/24 0711  Assess: MEWS Score  BP (!) 149/107  MAP (mmHg) 118  Pulse Rate 98  ECG Heart Rate (!) 149  Resp (!) 22  Level of Consciousness Alert  SpO2 96 %  O2 Device Room Air  Assess: MEWS Score  MEWS Temp 0  MEWS Systolic 0  MEWS Pulse 3  MEWS RR 1  MEWS LOC 0  MEWS Score 4  MEWS Score Color Red  Assess: if the MEWS score is Yellow or Red  Were vital signs accurate and taken at a resting state? Yes  Does the patient meet 2 or more of the SIRS criteria? Yes  Does the patient have a confirmed or suspected source of infection? No  MEWS guidelines implemented  Yes, red  Treat  MEWS Interventions Considered administering scheduled or prn medications/treatments as ordered  Take Vital Signs  Increase Vital Sign Frequency  Red: Q1hr x2, continue Q4hrs until patient remains green for 12hrs  Escalate  MEWS: Escalate Red: Discuss with charge nurse and notify provider. Consider notifying RRT. If remains red for 2 hours consider need for higher level of care  Notify: Charge Nurse/RN  Name of Charge Nurse/RN Notified Cabin Crew  Provider Notification  Provider Name/Title Gherghe  Date Provider Notified 02/01/24  Time Provider Notified [phone]  Method of Notification Face-to-face  Notification Reason Change in status (Afib RVR)  Provider response At bedside  Date of Provider Response 02/01/24  Time of Provider Response 0705  Assess: SIRS CRITERIA  SIRS Temperature  0  SIRS Respirations  1  SIRS Pulse 1  SIRS WBC 0  SIRS Score Sum  2   Glade Lee BSN RN [facility] 02/01/2024, 7:48 AM

## 2024-02-01 NOTE — Progress Notes (Signed)
 Pharmacy Antibiotic Note  Logan Phillips is a 73 y.o. male admitted on 01/28/2024 with pneumonia.  Pharmacy has been consulted for Vancomycin  dosing.  Plan: Adjust Vancomycin  1250 mg IV q24h eAUC 486; Tss 10.3, scr 00.98  Height: 5' 7 (170.2 cm) Weight: 62.4 kg (137 lb 9.1 oz) IBW/kg (Calculated) : 66.1  Temp (24hrs), Avg:98.4 F (36.9 C), Min:97.7 F (36.5 C), Max:99.3 F (37.4 C)  Recent Labs  Lab 01/28/24 1723 01/28/24 1744 01/29/24 0158 01/30/24 0425 01/30/24 0426 02/01/24 0401  WBC 13.1*  --  14.7* 9.5  --  9.9  CREATININE 1.47*  --  1.29*  --  1.21 0.98  LATICACIDVEN  --  1.0  --   --   --   --     Estimated Creatinine Clearance: 60.1 mL/min (by C-G formula based on SCr of 0.98 mg/dL).    No Known Allergies   Bud Kaeser BS, PharmD, BCPS Clinical Pharmacist 02/01/2024 7:30 AM  Contact: [phone] after 3 PM

## 2024-02-01 NOTE — Progress Notes (Signed)
 PT Cancellation Note  Patient Details Name: Logan Phillips MRN: [id number] DOB: 1950-09-05   Cancelled Treatment:    Reason Eval/Treat Not Completed: Patient not medically ready. Pt with tachycardia with HR in 180s and in afib with RVR. Pt being transferred to progressive care unit. PT to return as able, as appropriate to complete PT eval.  Norene Ames, PT, DPT Acute Rehabilitation Services Secure chat preferred Office #: [phone]    Norene CHRISTELLA Ames 02/01/2024, 8:03 AM

## 2024-02-02 ENCOUNTER — Ambulatory Visit: Payer: Self-pay

## 2024-02-02 DIAGNOSIS — I480 Paroxysmal atrial fibrillation: Secondary | ICD-10-CM | POA: Diagnosis not present

## 2024-02-02 LAB — COMPREHENSIVE METABOLIC PANEL WITH GFR
ALT: 10 U/L (ref 0–44)
AST: 12 U/L — ABNORMAL LOW (ref 15–41)
Albumin: 2.3 g/dL — ABNORMAL LOW (ref 3.5–5.0)
Alkaline Phosphatase: 57 U/L (ref 38–126)
Anion gap: 12 (ref 5–15)
BUN: 12 mg/dL (ref 8–23)
CO2: 26 mmol/L (ref 22–32)
Calcium: 8.2 mg/dL — ABNORMAL LOW (ref 8.9–10.3)
Chloride: 94 mmol/L — ABNORMAL LOW (ref 98–111)
Creatinine, Ser: 1.08 mg/dL (ref 0.61–1.24)
GFR, Estimated: >60 mL/min (ref >60)
Glucose, Bld: 113 mg/dL — ABNORMAL HIGH (ref 70–99)
Potassium: 3.5 mmol/L (ref 3.5–5.1)
Sodium: 132 mmol/L — ABNORMAL LOW (ref 135–145)
Total Bilirubin: 0.6 mg/dL (ref 0.0–1.2)
Total Protein: 6.2 g/dL — ABNORMAL LOW (ref 6.5–8.1)

## 2024-02-02 LAB — CULTURE, BLOOD (ROUTINE X 2)
Culture: NO GROWTH
Culture: NO GROWTH
Special Requests: ADEQUATE

## 2024-02-02 LAB — CBC
HCT: 34.5 % — ABNORMAL LOW (ref 39.0–52.0)
Hemoglobin: 11.3 g/dL — ABNORMAL LOW (ref 13.0–17.0)
MCH: 25.6 pg — ABNORMAL LOW (ref 26.0–34.0)
MCHC: 32.8 g/dL (ref 30.0–36.0)
MCV: 78.2 fL — ABNORMAL LOW (ref 80.0–100.0)
Platelets: 249 K/uL (ref 150–400)
RBC: 4.41 MIL/uL (ref 4.22–5.81)
RDW: 13.9 % (ref 11.5–15.5)
WBC: 11.2 K/uL — ABNORMAL HIGH (ref 4.0–10.5)
nRBC: 0 % (ref 0.0–0.2)

## 2024-02-02 LAB — MAGNESIUM: Magnesium: 1.9 mg/dL (ref 1.7–2.4)

## 2024-02-02 MED ORDER — METOPROLOL TARTRATE 50 MG PO TABS
50.0000 mg | ORAL_TABLET | Freq: Two times a day (BID) | ORAL | Status: DC
  Administered 2024-02-02 – 2024-02-03 (×2): 50 mg via ORAL
  Filled 2024-02-02 (×2): qty 1

## 2024-02-02 MED ORDER — VANCOMYCIN HCL 1250 MG/250ML IV SOLN
1250.0000 mg | INTRAVENOUS | Status: AC
  Administered 2024-02-03: 1250 mg via INTRAVENOUS
  Filled 2024-02-02: qty 250

## 2024-02-02 NOTE — TOC Progression Note (Signed)
 Transition of Care [facility]) - Progression Note    Patient Details  Name: Logan Phillips MRN: [id number] Date of Birth: 09-19-1950  [facility] CM/SW Contact  Waddell Barnie Rama, RN Phone Number: 02/02/2024, 2:57 PM  Clinical Narrative:    Per pt eval rec outpatient PT,  NCM spoke with patient, he states he does not want this NCM to set it up for him, he just want the information so that he can call and set it up.  NCM informed him that it needed to be set up prior to dc.  Patient states he wants to get his feet on the ground first and then look into outpt PT.  NCM will give him the information for the rehab center [facility].    Expected Discharge Plan: Home/Self Care Barriers to Discharge: Continued Medical Work up               Expected Discharge Plan and Services In-house Referral: Clinical Social Work     Living arrangements for the past 2 months: Single Family Home                                       Social Drivers of Health (SDOH) Interventions SDOH Screenings   Food Insecurity: No Food Insecurity (09/21/2022)  Housing: Low Risk  (09/21/2022)  Transportation Needs: No Transportation Needs (09/21/2022)  Utilities: Not At Risk (09/21/2022)  Alcohol Screen: Low Risk  (09/21/2022)  Depression (PHQ2-9): Low Risk  (10/27/2023)  Financial Resource Strain: Low Risk  (09/21/2022)  Physical Activity: Sufficiently Active (09/21/2022)  Social Connections: Socially Isolated (01/29/2024)  Stress: No Stress Concern Present (09/21/2022)  Tobacco Use: Low Risk  (02/01/2024)    Readmission Risk Interventions     No data to display

## 2024-02-02 NOTE — Evaluation (Signed)
 Physical Therapy Evaluation Patient Details Name: Logan Phillips MRN: [id number] DOB: 1950-11-24 Today's Date: 02/02/2024  History of Present Illness  Pt is a 73 y/o male presenting 11/15 to the hospital with confusion which started abruptly while playing golf with friends.  Work up concerning for worsening (lipoid) pneumonia.  Course complicated by new onset of Afib with RVR 11/19.  PMH, lipoid/necrotizing pneumonia, htn, gout with ETOH use.  Clinical Impression  Pt admitted with/for confusion and signs of worsening PNA.  Pt presently weak and fatigued and needing CGA for safety.  Pt currently limited functionally due to the problems listed below.  (see problems list.)  Pt will benefit from PT to maximize function and safety to be able to get home safely with available assist .         If plan is discharge home, recommend the following:  (PRN assist from roommate)   Can travel by private vehicle        Equipment Recommendations None recommended by PT  Recommendations for Other Services       Functional Status Assessment Patient has had a recent decline in their functional status and demonstrates the ability to make significant improvements in function in a reasonable and predictable amount of time.     Precautions / Restrictions Precautions Precautions: Fall Recall of Precautions/Restrictions: Intact      Mobility  Bed Mobility Overal bed mobility: Needs Assistance Bed Mobility: Supine to Sit, Sit to Supine     Supine to sit: Supervision Sit to supine: Supervision   General bed mobility comments: slow movement egress/ingress, appropriate use of UE's    Transfers Overall transfer level: Needs assistance   Transfers: Sit to/from Stand Sit to Stand: Supervision           General transfer comment: used the back of his legs for stability until was able to stand unassisted.  pt reported dizzy from days without mobility.    Ambulation/Gait Ambulation/Gait  assistance: Contact guard assist Gait Distance (Feet): 160 Feet Assistive device: None Gait Pattern/deviations: Step-through pattern   Gait velocity interpretation: <1.8 ft/sec, indicate of risk for recurrent falls   General Gait Details: short mildly unsteady steps, guarded with short stride for age or for working part-time.  VSS with HR max with activity 93 bpm and sats in lower 90's %.  Pt notably fatigued after activity in the halls.  Stairs            Wheelchair Mobility     Tilt Bed    Modified Rankin (Stroke Patients Only)       Balance Overall balance assessment: Needs assistance Sitting-balance support: No upper extremity supported, Feet supported Sitting balance-Leahy Scale: Good     Standing balance support: Single extremity supported, No upper extremity supported, During functional activity Standing balance-Leahy Scale: Fair Standing balance comment: after initial stability on bed frame, pt able to balance at EOB, look over each shoulder, reach to the floor and turn 360 degrees both directions, then stop and hold generally steady.                             Pertinent Vitals/Pain Pain Assessment Pain Assessment: Faces Faces Pain Scale: No hurt Pain Intervention(s): Monitored during session    Home Living Family/patient expects to be discharged to:: Private residence Living Arrangements: Non-relatives/Friends Available Help at Discharge: Friend(s);Available PRN/intermittently Type of Home: House Home Access: Stairs to enter Entrance Stairs-Rails: Right;Left Entrance Stairs-Number of Steps:  several   Home Layout: One level Home Equipment: None      Prior Function Prior Level of Function : Independent/Modified Independent;Working/employed;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, 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.)                     Extremity/Trunk Assessment   Upper Extremity Assessment Upper Extremity Assessment: Overall WFL  for tasks assessed;Generalized weakness    Lower Extremity Assessment Lower Extremity Assessment: Overall WFL for tasks assessed;Generalized weakness       Communication   Communication Communication: No apparent difficulties    Cognition Arousal: Alert Behavior During Therapy: Flat affect, WFL for tasks assessed/performed   PT - Cognitive impairments: No apparent impairments                         Following commands: Intact       Cueing Cueing Techniques: Verbal cues     General Comments General comments (skin integrity, edema, etc.): vss, on RA  see gait comments    Exercises     Assessment/Plan    PT Assessment Patient needs continued PT services  PT Problem List Decreased strength;Decreased activity tolerance;Decreased balance;Cardiopulmonary status limiting activity;Decreased mobility       PT Treatment Interventions Gait training;Functional mobility training;Therapeutic activities;Balance training;Patient/family education;Stair training    PT Goals (Current goals can be found in the Care Plan section)  Acute Rehab PT Goals Patient Stated Goal: home Independent when released. PT Goal Formulation: With patient Time For Goal Achievement: 02/16/24 Potential to Achieve Goals: Good    Frequency Min 3X/week     Co-evaluation               AM-PAC PT 6 Clicks Mobility  Outcome Measure Help needed turning from your back to your side while in a flat bed without using bedrails?: A Little Help needed moving from lying on your back to sitting on the side of a flat bed without using bedrails?: A Little Help needed moving to and from a bed to a chair (including a wheelchair)?: A Little Help needed standing up from a chair using your arms (e.g., wheelchair or bedside chair)?: A Little Help needed to walk in hospital room?: A Little Help needed climbing 3-5 steps with a railing? : A Little 6 Click Score: 18    End of Session   Activity  Tolerance: Patient tolerated treatment well;Patient limited by fatigue Patient left: in bed;with call bell/phone within reach Nurse Communication: Mobility status PT Visit Diagnosis: Unsteadiness on feet (R26.81);Difficulty in walking, not elsewhere classified (R26.2)    Time: 8944-8887 PT Time Calculation (min) (ACUTE ONLY): 17 min   Charges:   PT Evaluation $PT Eval Moderate Complexity: 1 Mod   PT General Charges $$ ACUTE PT VISIT: 1 Visit         02/02/2024  [place] HERO., PT Acute Rehabilitation Services [phone]  (office)02/02/2024  [place] HERO., PT Acute Rehabilitation Services [phone]  (office)  Vinie GAILS Julez Huseby 02/02/2024, 12:08 PM

## 2024-02-02 NOTE — Progress Notes (Unsigned)
 Patient is in hospital

## 2024-02-02 NOTE — Progress Notes (Signed)
 PROGRESS NOTE  Logan Phillips FMW:[id number] DOB: 20-Oct-1950 DOA: 01/28/2024 PCP: Bulah Alm RAMAN, PA-C   LOS: 5 days   Brief Narrative / Interim history: 73 year old male with history of lipoid/necrotizing pneumonia, HTN, gout, EtOH use who presented to the hospital with confusion.  He apparently was playing golf with his friends and suddenly became confused.  He was found to be febrile to 102.4 on admission, tachycardic, had a leukocytosis and imaging was concerning for worsening pneumonia.  He was placed on antibiotics and admitted to the hospital.  COVID, influenza, RSV all negative.  Hospital course complicated by new onset A-fib with RVR 11/19  Subjective / 24h Interval events: Feeling better, denies any central/substernal chest pain, denies any shortness of breath.  Has some left-sided pleuritic type pain with deep breathing.  Denies any palpitations overnight  Assesement and Plan: Principal problem Severe sepsis -patient presented to the hospital with high-grade fever, tachycardia, tachypnea, elevated white count.  CT scan of the chest showed known history of lipoid pneumonia, slightly progressed since prior study.  Suspect superimposed infection, started on antibiotics, will continue for now, plan for total of 7 days - COVID, influenza, RSV all negative - Sepsis physiology improving  Active problems Paroxysmal A-fib / flutter with RVR -new onset 11/19, he is relatively asymptomatic. Cards consulted, appreciate input, remains in A-fib this morning and on Cardizem  infusion, rates are controlled for now.  History of recurrent lipoid pneumonia - History of recurrent  pneumonia pneumonia. Follows with Dr Meade. Productive cough over past 1 year no dyspnea.  Works as a administrator, public affairs consultant .  Never smoked.  Currently on room air.  Speech therapy evaluated the patient and recommended regular diet.  We recommend to follow-up with Dr. Meade, pulmonology as an outpatient after  discharge.   Hyponatremia -sodium overall stable, no large shifts noted   Acute metabolic encephalopathy - In the setting of severe sepsis.  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  Case was discussed with neurology.  MRI did not show any acute intracranial abnormalities.   Alcohol use - Drinks 1-2 beers or vodka about 4 times a week.  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  Case discussed with neurology.  Recommend outpatient follow-up with neurosurgery.  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   Hypertension -blood pressure is stable and acceptable  Hyponatremia-no large shifts, monitor  Gout - Continue allopurinol    Hypophosphatemia -continue to monitor and supplement as indicated   Poor oral intake/malnutrition - Mentioned that he has been eating poorly and losing weight since last few months.  We consulted dietitian   Deconditioning/debility - PT consulted   Scheduled Meds:  allopurinol   100 mg Oral Daily   Chlorhexidine  Gluconate Cloth  6 each Topical Daily   enoxaparin  (LOVENOX ) injection  40 mg Subcutaneous Daily   feeding supplement  237 mL Oral BID BM   folic acid   1 mg Oral Daily   guaiFENesin   600 mg Oral BID   metoprolol  tartrate  25 mg Oral BID   multivitamin with minerals  1 tablet Oral Daily   mupirocin  ointment  1 Application Nasal BID   phosphorus  250 mg Oral TID   rosuvastatin   10 mg Oral Daily   thiamine   100 mg Oral Daily   Or   thiamine   100 mg Intravenous Daily   Continuous Infusions:  azithromycin  Stopped (02/01/24 2016)   cefTRIAXone  (ROCEPHIN )  IV 200 mL/hr at 02/02/24 0414   diltiazem  (CARDIZEM ) infusion 5 mg/hr (02/02/24 0414)   vancomycin  Stopped (02/01/24 2257)   PRN Meds:.acetaminophen  **OR** acetaminophen , albuterol , menthol , ondansetron  **OR**  ondansetron  (ZOFRAN ) IV, mouth rinse, oxyCODONE , polyethylene glycol  Current Outpatient Medications  Medication Instructions   acetaminophen  (TYLENOL ) 1,000 mg, Oral, Every 6 hours PRN   albuterol  (PROVENTIL ) 2.5 mg, Nebulization, Every 6 hours PRN   albuterol  (VENTOLIN  HFA) 108 (90 Base) MCG/ACT inhaler 2 puffs, Inhalation, Every 6 hours PRN   allopurinol  (ZYLOPRIM ) 100 mg, Oral, Daily   amphetamine -dextroamphetamine  (ADDERALL) 10 MG tablet 10 mg, Oral, Daily with breakfast   docusate sodium  (COLACE) 100 mg, Oral, 2 times daily   lisinopril  (ZESTRIL ) 2.5 MG tablet TAKE 1 TABLET(2.5 MG) BY MOUTH IN THE MORNING AND AT BEDTIME   meloxicam  (MOBIC ) 15 MG tablet TAKE 1 TABLET(15 MG) BY MOUTH DAILY   metoprolol  tartrate (LOPRESSOR ) 100 mg, Daily   Multiple Vitamin (MULITIVITAMIN WITH MINERALS) TABS 1 tablet, Daily   Oxycodone  HCl 10 mg, Oral, 5 times daily   rosuvastatin  (CRESTOR ) 10 mg, Oral, Daily   Testosterone  25 MG/2.5GM (1%) GEL 2 Pump, Topical, Daily    Diet Orders (From admission, onward)     Start     Ordered   01/31/24 1515  Diet regular Room service appropriate? Yes; Fluid consistency: Thin  Diet effective now       Question Answer Comment  Room service appropriate? Yes   Fluid consistency: Thin      01/31/24 1514            DVT prophylaxis: enoxaparin  (LOVENOX ) injection 40 mg Start: 01/29/24 1000   Lab Results  Component Value Date   PLT 249 02/02/2024      Code Status: Full Code  Family Communication: No family at bedside  Status is: Inpatient Remains inpatient appropriate because: Severity of illness  Level of care: Progressive  Consultants:  Cardiology  Objective: Vitals:   02/01/24 2038 02/01/24 2319 02/02/24 0352 02/02/24 0846  BP: 129/80 112/83 (!) 143/87 (!) 130/94  Pulse: 79 79 79 91  Resp: 19 16 18 17   Temp: 98.3 F (36.8 C) 98.4 F (36.9 C) 98.4 F (36.9 C) 98.6 F (37 C)  TempSrc: Oral Oral Oral Oral  SpO2: 96% 96% 97% 96%   Weight:      Height:        Intake/Output Summary (Last 24 hours) at 02/02/2024 0943 Last data filed at 02/02/2024 0414 Gross per 24 hour  Intake 970.12 ml  Output 600 ml  Net 370.12 ml   Wt Readings from Last 3 Encounters:  02/01/24 64.8 kg  11/21/23 61.1 kg  10/27/23 63.8 kg    Examination:  Constitutional: NAD Eyes: lids and conjunctivae normal, no scleral icterus ENMT: mmm Neck: normal, supple Respiratory: clear to auscultation bilaterally, no wheezing, no crackles.  Cardiovascular: irregular Abdomen: soft, no distention, no tenderness. Bowel sounds positive.   Data Reviewed: I have independently reviewed following labs and imaging studies   CBC Recent Labs  Lab 01/28/24 1723 01/28/24 1747 01/29/24 0158 01/30/24 0425 02/01/24 0401 02/02/24 0242  WBC 13.1*  --  14.7* 9.5 9.9 11.2*  HGB 11.4* 12.2* 10.3* 9.5* 11.1* 11.3*  HCT 34.4* 36.0* 31.1* 28.9* 33.8* 34.5*  PLT 218  --  178 163 220 249  MCV 79.8*  --  79.3* 79.8* 78.6* 78.2*  MCH 26.5  --  26.3 26.2 25.8* 25.6*  MCHC 33.1  --  33.1 32.9 32.8 32.8  RDW 14.0  --  14.1 13.9 13.9 13.9  LYMPHSABS 0.6*  --   --  0.7  --   --   MONOABS 0.5  --   --  0.5  --   --   EOSABS 0.0  --   --  0.1  --   --   BASOSABS 0.0  --   --  0.0  --   --     Recent Labs  Lab 01/28/24 1723 01/28/24 1744 01/28/24 1747 01/29/24 0158 01/30/24 0425 01/30/24 0426 02/01/24 0401 02/02/24 0242  NA 130*  --  130* 131*  --  135 132* 132*  K 4.6  --  4.6 4.1  --  4.1 3.7 3.5  CL 94*  --   --  96*  --  101 93* 94*  CO2 24  --   --  23  --  24 26 26   GLUCOSE 121*  --   --  110*  --  99 114* 113*  BUN 23  --   --  19  --  15 10 12   CREATININE 1.47*  --   --  1.29*  --  1.21 0.98 1.08  CALCIUM  9.4  --   --  8.7*  --  8.1* 8.2* 8.2*  AST 17  --   --   --   --   --   --  12*  ALT 10  --   --   --   --   --   --  10  ALKPHOS 70  --   --   --   --   --   --  57  BILITOT 1.0  --   --   --   --   --   --  0.6  ALBUMIN 3.8  --   --    --   --  2.4*  --  2.3*  MG  --   --   --  1.9 2.0  --   --  1.9  PROCALCITON  --   --   --  2.33  --   --   --   --   LATICACIDVEN  --  1.0  --   --   --   --   --   --   INR 1.2  --   --   --   --   --   --   --   AMMONIA 23  --   --   --   --   --   --   --     ------------------------------------------------------------------------------------------------------------------ No results for input(s): CHOL, HDL, LDLCALC, TRIG, CHOLHDL, LDLDIRECT in the last 72 hours.  Lab Results  Component Value Date   HGBA1C 4.8 10/05/2017   ------------------------------------------------------------------------------------------------------------------ No results for input(s): TSH, T4TOTAL, T3FREE, THYROIDAB in the last 72 hours.  Invalid input(s): FREET3  Cardiac Enzymes No results for input(s): CKMB, TROPONINI, MYOGLOBIN in the last 168 hours.  Invalid input(s): CK ------------------------------------------------------------------------------------------------------------------    Component Value Date/Time   BNP 35.1 12/07/2021 2149    CBG: Recent Labs  Lab 01/28/24 1540 01/31/24 2232  GLUCAP 134* 117*    Recent Results (from the past 240 hours)  Resp panel by RT-PCR (RSV, Flu A&B, Covid) Anterior Nasal Swab     Status: None   Collection Time: 01/28/24  3:56 PM   Specimen: Anterior Nasal  Swab  Result Value Ref Range Status   SARS Coronavirus 2 by RT PCR NEGATIVE NEGATIVE Final   Influenza A by PCR NEGATIVE NEGATIVE Final   Influenza B by PCR NEGATIVE NEGATIVE Final    Comment: (NOTE) The Xpert Xpress SARS-CoV-2/FLU/RSV plus assay is intended as an aid in the diagnosis of influenza from Nasopharyngeal swab specimens and should not be used as a sole basis for treatment. Nasal washings and aspirates are unacceptable for Xpert Xpress SARS-CoV-2/FLU/RSV testing.  Fact Sheet for Patients: bloggercourse.com  Fact Sheet for  Healthcare Providers: seriousbroker.it  This test is not yet approved or cleared by the [place]  FDA and has been authorized for detection and/or diagnosis of SARS-CoV-2 by FDA under an Emergency Use Authorization (EUA). This EUA will remain in effect (meaning this test can be used) for the duration of the COVID-19 declaration under Section 564(b)(1) of the Act, 21 U.S.C. section 360bbb-3(b)(1), unless the authorization is terminated or revoked.     Resp Syncytial Virus by PCR NEGATIVE NEGATIVE Final    Comment: (NOTE) Fact Sheet for Patients: bloggercourse.com  Fact Sheet for Healthcare Providers: seriousbroker.it  This test is not yet approved or cleared by the [place]  FDA and has been authorized for detection and/or diagnosis of SARS-CoV-2 by FDA under an Emergency Use Authorization (EUA). This EUA will remain in effect (meaning this test can be used) for the duration of the COVID-19 declaration under Section 564(b)(1) of the Act, 21 U.S.C. section 360bbb-3(b)(1), unless the authorization is terminated or revoked.  Performed at [facility], [address] [place], [place] [address]   Culture, blood (Routine x 2)     Status: None   Collection Time: 01/28/24  5:20 PM   Specimen: BLOOD LEFT ARM  Result Value Ref Range Status   Specimen Description BLOOD LEFT ARM  Final   Special Requests   Final    BOTTLES DRAWN AEROBIC AND ANAEROBIC Blood Culture adequate volume   Culture   Final    NO GROWTH 5 DAYS Performed at [facility], [address], [place], [place] [address]    Report Status 02/02/2024 FINAL  Final  Culture, blood (Routine x 2)     Status: None   Collection Time: 01/28/24  5:52 PM   Specimen: BLOOD LEFT ARM  Result Value Ref Range Status   Specimen Description BLOOD LEFT ARM  Final   Special Requests   Final    BOTTLES DRAWN AEROBIC ONLY Blood Culture results  may not be optimal due to an inadequate volume of blood received in culture bottles   Culture   Final    NO GROWTH 5 DAYS Performed at [facility], [address] [place], [place] [address]    Report Status 02/02/2024 FINAL  Final  Respiratory (~20 pathogens) panel by PCR     Status: None   Collection Time: 01/28/24  8:17 PM   Specimen: Nasopharyngeal Swab; Respiratory  Result Value Ref Range Status   Adenovirus NOT DETECTED NOT DETECTED Final   Coronavirus 229E NOT DETECTED NOT DETECTED Final    Comment: (NOTE) The Coronavirus on the Respiratory Panel, DOES NOT test for the novel  Coronavirus (2019 nCoV)    Coronavirus HKU1 NOT DETECTED NOT DETECTED Final   Coronavirus NL63 NOT DETECTED NOT DETECTED Final   Coronavirus OC43 NOT DETECTED NOT DETECTED Final   Metapneumovirus NOT DETECTED NOT DETECTED Final   Rhinovirus / Enterovirus NOT DETECTED NOT DETECTED Final  Influenza A NOT DETECTED NOT DETECTED Final   Influenza B NOT DETECTED NOT DETECTED Final   Parainfluenza Virus 1 NOT DETECTED NOT DETECTED Final   Parainfluenza Virus 2 NOT DETECTED NOT DETECTED Final   Parainfluenza Virus 3 NOT DETECTED NOT DETECTED Final   Parainfluenza Virus 4 NOT DETECTED NOT DETECTED Final   Respiratory Syncytial Virus NOT DETECTED NOT DETECTED Final   Bordetella pertussis NOT DETECTED NOT DETECTED Final   Bordetella Parapertussis NOT DETECTED NOT DETECTED Final   Chlamydophila pneumoniae NOT DETECTED NOT DETECTED Final   Mycoplasma pneumoniae NOT DETECTED NOT DETECTED Final    Comment: Performed at [facility], [address], [place], [place] [address]  Urine Culture (for pregnant, neutropenic or urologic patients or patients with an indwelling urinary catheter)     Status: None   Collection Time: 01/28/24 10:10 PM   Specimen: Urine, Clean Catch  Result Value Ref Range Status   Specimen Description URINE, CLEAN CATCH  Final   Special Requests NONE  Final   Culture   Final     NO GROWTH Performed at [facility], [address] [place], [place] [address]    Report Status 01/30/2024 FINAL  Final  MRSA Next Gen by PCR, Nasal     Status: Abnormal   Collection Time: 01/31/24  8:12 AM   Specimen: Nasal Mucosa; Nasal Swab  Result Value Ref Range Status   MRSA by PCR Next Gen DETECTED (A) NOT DETECTED Final    Comment: RESULT CALLED TO, READ BACK BY AND VERIFIED WITH: RN J.NARAMDAS AT 1254 ON 01/31/2024 BY T.SAAD. (NOTE) The GeneXpert MRSA Assay (FDA approved for NASAL specimens only), is one component of a comprehensive MRSA colonization surveillance program. It is not intended to diagnose MRSA infection nor to guide or monitor treatment for MRSA infections. Test performance is not FDA approved in patients less than 58 years old. Performed at [facility], [address] [place], [place] [address]     Radiology Studies: ECHOCARDIOGRAM COMPLETE Result Date: 02/01/2024    ECHOCARDIOGRAM REPORT   Patient Name:   Danile DELENA FAITH Date of Exam: 02/01/2024 Medical Rec #:  [id number]      Height:       67.0 in Accession #:    [id number]     Weight:       137.6 lb Date of Birth:  1950-12-17      BSA:          1.725 m Patient Age:    73 years       BP:           127/81 mmHg Patient Gender: M              HR:           70 bpm. Exam Location:  Inpatient Procedure: 2D Echo, 3D Echo, Cardiac Doppler and Color Doppler (Both Spectral            and Color Flow Doppler were utilized during procedure). Indications:    Atrial fibrillation  History:        Patient has prior history of Echocardiogram examinations, most                 recent 10/01/2022. CAD; Risk Factors:Hypertension and                 Dyslipidemia.  Sonographer:    Philomena Daring Referring Phys: [id number] NILDA HERO Shatika Grinnell IMPRESSIONS  1. Left ventricular  ejection fraction, by estimation, is 60 to 65%. Left ventricular ejection fraction by 3D volume is 58 %. The left ventricle has normal function. The left ventricle has  no regional wall motion abnormalities. There is mild concentric left ventricular hypertrophy. Left ventricular diastolic parameters are consistent with Grade I diastolic dysfunction (impaired relaxation).  2. Right ventricular systolic function is normal. The right ventricular size is normal.  3. The mitral valve is normal in structure. Trivial mitral valve regurgitation. No evidence of mitral stenosis.  4. The aortic valve is tricuspid. There is moderate calcification of the aortic valve. Aortic valve regurgitation is not visualized. No aortic stenosis is present.  5. The inferior vena cava is normal in size with greater than 50% respiratory variability, suggesting right atrial pressure of 3 mmHg. FINDINGS  Left Ventricle: Left ventricular ejection fraction, by estimation, is 60 to 65%. Left ventricular ejection fraction by 3D volume is 58 %. The left ventricle has normal function. The left ventricle has no regional wall motion abnormalities. The left ventricular internal cavity size was normal in size. There is mild concentric left ventricular hypertrophy. Left ventricular diastolic parameters are consistent with Grade I diastolic dysfunction (impaired relaxation). Right Ventricle: The right ventricular size is normal. No increase in right ventricular wall thickness. Right ventricular systolic function is normal. Left Atrium: Left atrial size was normal in size. Right Atrium: Right atrial size was normal in size. Pericardium: There is no evidence of pericardial effusion. Mitral Valve: The mitral valve is normal in structure. Trivial mitral valve regurgitation. No evidence of mitral valve stenosis. Tricuspid Valve: The tricuspid valve is normal in structure. Tricuspid valve regurgitation is trivial. No evidence of tricuspid stenosis. Aortic Valve: The aortic valve is tricuspid. There is moderate calcification of the aortic valve. Aortic valve regurgitation is not visualized. No aortic stenosis is present. Pulmonic  Valve: The pulmonic valve was not well visualized. Pulmonic valve regurgitation is not visualized. No evidence of pulmonic stenosis. Aorta: The aortic root is normal in size and structure. Venous: The inferior vena cava is normal in size with greater than 50% respiratory variability, suggesting right atrial pressure of 3 mmHg. IAS/Shunts: No atrial level shunt detected by color flow Doppler. Additional Comments: 3D was performed not requiring image post processing on an independent workstation and was normal.  LEFT VENTRICLE PLAX 2D LVIDd:         4.10 cm         Diastology LVIDs:         2.90 cm         LV e' medial:    6.42 cm/s LV PW:         1.10 cm         LV E/e' medial:  9.2 LV IVS:        1.10 cm         LV e' lateral:   6.09 cm/s LVOT diam:     1.94 cm         LV E/e' lateral: 9.7 LV SV:         58 LV SV Index:   34 LVOT Area:     2.96 cm        3D Volume EF                                LV 3D EF:    Left  ventricul                                             ar                                             ejection                                             fraction                                             by 3D                                             volume is                                             58 %.                                 3D Volume EF:                                3D EF:        58 %                                LV EDV:       137 ml                                LV ESV:       58 ml                                LV SV:        79 ml RIGHT VENTRICLE             IVC RV Basal diam:  2.39 cm     IVC diam: 1.41 cm RV Mid diam:    1.88 cm RV S prime:     10.00 cm/s TAPSE (M-mode): 1.9 cm LEFT ATRIUM             Index        RIGHT ATRIUM          Index LA diam:        2.56 cm 1.48 cm/m   RA Area:     8.89 cm LA Vol (A2C):   35.4 ml 20.52 ml/m  RA Volume:   15.50 ml 8.99 ml/m  LA Vol (A4C):   28.6 ml 16.58 ml/m LA Biplane Vol: 34.5 ml  20.00 ml/m  AORTIC VALVE LVOT Vmax:   101.00 cm/s LVOT Vmean:  62.900 cm/s LVOT VTI:    0.196 m  AORTA Ao Root diam: 2.88 cm Ao Asc diam:  2.99 cm MITRAL VALVE MV Area (PHT): 2.43 cm    SHUNTS MV Decel Time: 312 msec    Systemic VTI:  0.20 m MV E velocity: 59.20 cm/s  Systemic Diam: 1.94 cm MV A velocity: 82.30 cm/s MV E/A ratio:  0.72 Toribio Fuel MD Electronically signed by Toribio Fuel MD Signature Date/Time: 02/01/2024/8:51:25 PM    Final     CRITICAL CARE Performed by: Nilda Fendt   Total critical care time: 40 minutes  Critical care time was exclusive of separately billable procedures and treating other patients.  Critical care was necessary to treat or prevent imminent or life-threatening deterioration.  Critical care was time spent personally by me on the following activities: bedside evaluation of new onset A fib with RVR with rates up to 200, evaluate hemodynamic stability, attempted BB with no significant the rate, transition to Cardizem continuous IV infusion, development of treatment plan with patient and/or surrogate as well as nursing, discussions with cardiology consultants, evaluation of patient's response to treatment, examination of patient, obtaining history from patient or surrogate, ordering and performing treatments and interventions, ordering and review of laboratory studies, ordering and review of radiographic studies, pulse oximetry and re-evaluation of patient's condition.   Nilda Fendt, MD, PhD [facility]  Between 7 am - 7 pm I am available, please contact me via Amion (for emergencies) or Securechat (non urgent messages)  Between 7 pm - 7 am I am not available, please contact night coverage MD/APP via Amion

## 2024-02-02 NOTE — Progress Notes (Signed)
 Rounding Note   Patient Name: Logan Phillips Date of Encounter: 02/02/2024  [facility] Cardiologist: Redell Shallow, MD   Subjective Patient has chest pain on the left side with inspiration; occasional mild dyspnea.  Scheduled Meds:  allopurinol   100 mg Oral Daily   Chlorhexidine  Gluconate Cloth  6 each Topical Daily   enoxaparin  (LOVENOX ) injection  40 mg Subcutaneous Daily   feeding supplement  237 mL Oral BID BM   folic acid   1 mg Oral Daily   guaiFENesin   600 mg Oral BID   metoprolol  tartrate  25 mg Oral BID   multivitamin with minerals  1 tablet Oral Daily   mupirocin  ointment  1 Application Nasal BID   phosphorus  250 mg Oral TID   rosuvastatin   10 mg Oral Daily   thiamine   100 mg Oral Daily   Or   thiamine   100 mg Intravenous Daily   Continuous Infusions:  azithromycin  Stopped (02/01/24 2016)   cefTRIAXone  (ROCEPHIN )  IV 200 mL/hr at 02/02/24 0414   diltiazem  (CARDIZEM ) infusion 5 mg/hr (02/02/24 0414)   vancomycin  Stopped (02/01/24 2257)   PRN Meds: acetaminophen  **OR** acetaminophen , albuterol , menthol , ondansetron  **OR** ondansetron  (ZOFRAN ) IV, mouth rinse, oxyCODONE , polyethylene glycol   Vital Signs  Vitals:   02/01/24 2038 02/01/24 2319 02/02/24 0352 02/02/24 0846  BP: 129/80 112/83 (!) 143/87 (!) 130/94  Pulse: 79 79 79 91  Resp: 19 16 18 17   Temp: 98.3 F (36.8 C) 98.4 F (36.9 C) 98.4 F (36.9 C) 98.6 F (37 C)  TempSrc: Oral Oral Oral Oral  SpO2: 96% 96% 97% 96%  Weight:      Height:        Intake/Output Summary (Last 24 hours) at 02/02/2024 0937 Last data filed at 02/02/2024 0414 Gross per 24 hour  Intake 970.12 ml  Output 600 ml  Net 370.12 ml      02/01/2024   11:09 AM 01/29/2024    1:13 PM 01/28/2024    3:36 PM  Last 3 Weights  Weight (lbs) 142 lb 13.7 oz 137 lb 9.1 oz 150 lb  Weight (kg) 64.8 kg 62.4 kg 68.04 kg      Telemetry Sinus with short runs of SVT and 1 run of nonsustained ventricular tachycardia, PACs  noted- Personally Reviewed   Physical Exam  GEN: No acute distress.   Neck: No JVD Cardiac: RRR, no murmurs, rubs, or gallops.  Respiratory: Clear to auscultation bilaterally. GI: Soft, nontender, non-distended  MS: No edema Neuro:  Nonfocal  Psych: Normal affect   Labs  Chemistry Recent Labs  Lab 01/28/24 1723 01/28/24 1747 01/29/24 0158 01/30/24 0425 01/30/24 0426 02/01/24 0401 02/02/24 0242  NA 130*   < > 131*  --  135 132* 132*  K 4.6   < > 4.1  --  4.1 3.7 3.5  CL 94*  --  96*  --  101 93* 94*  CO2 24  --  23  --  24 26 26   GLUCOSE 121*  --  110*  --  99 114* 113*  BUN 23  --  19  --  15 10 12   CREATININE 1.47*  --  1.29*  --  1.21 0.98 1.08  CALCIUM  9.4  --  8.7*  --  8.1* 8.2* 8.2*  MG  --   --  1.9 2.0  --   --  1.9  PROT 7.9  --   --   --   --   --  6.2*  ALBUMIN 3.8  --   --   --  2.4*  --  2.3*  AST 17  --   --   --   --   --  12*  ALT 10  --   --   --   --   --  10  ALKPHOS 70  --   --   --   --   --  57  BILITOT 1.0  --   --   --   --   --  0.6  GFRNONAA 50*  --  59*  --  >60 >60 >60  ANIONGAP 12  --  12  --  10 13 12    < > = values in this interval not displayed.    Hematology Recent Labs  Lab 01/30/24 0425 02/01/24 0401 02/01/24 1118 02/02/24 0242  WBC 9.5 9.9  --  11.2*  RBC 3.62* 4.30 4.80 4.41  HGB 9.5* 11.1*  --  11.3*  HCT 28.9* 33.8*  --  34.5*  MCV 79.8* 78.6*  --  78.2*  MCH 26.2 25.8*  --  25.6*  MCHC 32.9 32.8  --  32.8  RDW 13.9 13.9  --  13.9  PLT 163 220  --  249    Radiology  ECHOCARDIOGRAM COMPLETE Result Date: 02/01/2024    ECHOCARDIOGRAM REPORT   Patient Name:   Logan Phillips Date of Exam: 02/01/2024 Medical Rec #:  [id number]      Height:       67.0 in Accession #:    [id number]     Weight:       137.6 lb Date of Birth:  05/28/1950      BSA:          1.725 m Patient Age:    73 years       BP:           127/81 mmHg Patient Gender: M              HR:           70 bpm. Exam Location:  Inpatient Procedure: 2D Echo, 3D  Echo, Cardiac Doppler and Color Doppler (Both Spectral            and Color Flow Doppler were utilized during procedure). Indications:    Atrial fibrillation  History:        Patient has prior history of Echocardiogram examinations, most                 recent 10/01/2022. CAD; Risk Factors:Hypertension and                 Dyslipidemia.  Sonographer:    Philomena Daring Referring Phys: [id number] NILDA HERO GHERGHE IMPRESSIONS  1. Left ventricular ejection fraction, by estimation, is 60 to 65%. Left ventricular ejection fraction by 3D volume is 58 %. The left ventricle has normal function. The left ventricle has no regional wall motion abnormalities. There is mild concentric left ventricular hypertrophy. Left ventricular diastolic parameters are consistent with Grade I diastolic dysfunction (impaired relaxation).  2. Right ventricular systolic function is normal. The right ventricular size is normal.  3. The mitral valve is normal in structure. Trivial mitral valve regurgitation. No evidence of mitral stenosis.  4. The aortic valve is tricuspid. There is moderate calcification of the aortic valve. Aortic valve regurgitation is not visualized. No aortic stenosis is present.  5. The inferior vena cava is normal in size with greater than  50% respiratory variability, suggesting right atrial pressure of 3 mmHg. FINDINGS  Left Ventricle: Left ventricular ejection fraction, by estimation, is 60 to 65%. Left ventricular ejection fraction by 3D volume is 58 %. The left ventricle has normal function. The left ventricle has no regional wall motion abnormalities. The left ventricular internal cavity size was normal in size. There is mild concentric left ventricular hypertrophy. Left ventricular diastolic parameters are consistent with Grade I diastolic dysfunction (impaired relaxation). Right Ventricle: The right ventricular size is normal. No increase in right ventricular wall thickness. Right ventricular systolic function is normal. Left  Atrium: Left atrial size was normal in size. Right Atrium: Right atrial size was normal in size. Pericardium: There is no evidence of pericardial effusion. Mitral Valve: The mitral valve is normal in structure. Trivial mitral valve regurgitation. No evidence of mitral valve stenosis. Tricuspid Valve: The tricuspid valve is normal in structure. Tricuspid valve regurgitation is trivial. No evidence of tricuspid stenosis. Aortic Valve: The aortic valve is tricuspid. There is moderate calcification of the aortic valve. Aortic valve regurgitation is not visualized. No aortic stenosis is present. Pulmonic Valve: The pulmonic valve was not well visualized. Pulmonic valve regurgitation is not visualized. No evidence of pulmonic stenosis. Aorta: The aortic root is normal in size and structure. Venous: The inferior vena cava is normal in size with greater than 50% respiratory variability, suggesting right atrial pressure of 3 mmHg. IAS/Shunts: No atrial level shunt detected by color flow Doppler. Additional Comments: 3D was performed not requiring image post processing on an independent workstation and was normal.  LEFT VENTRICLE PLAX 2D LVIDd:         4.10 cm         Diastology LVIDs:         2.90 cm         LV e' medial:    6.42 cm/s LV PW:         1.10 cm         LV E/e' medial:  9.2 LV IVS:        1.10 cm         LV e' lateral:   6.09 cm/s LVOT diam:     1.94 cm         LV E/e' lateral: 9.7 LV SV:         58 LV SV Index:   34 LVOT Area:     2.96 cm        3D Volume EF                                LV 3D EF:    Left                                             ventricul                                             ar  ejection                                             fraction                                             by 3D                                             volume is                                             58 %.                                 3D Volume EF:                                 3D EF:        58 %                                LV EDV:       137 ml                                LV ESV:       58 ml                                LV SV:        79 ml RIGHT VENTRICLE             IVC RV Basal diam:  2.39 cm     IVC diam: 1.41 cm RV Mid diam:    1.88 cm RV S prime:     10.00 cm/s TAPSE (M-mode): 1.9 cm LEFT ATRIUM             Index        RIGHT ATRIUM          Index LA diam:        2.56 cm 1.48 cm/m   RA Area:     8.89 cm LA Vol (A2C):   35.4 ml 20.52 ml/m  RA Volume:   15.50 ml 8.99 ml/m LA Vol (A4C):   28.6 ml 16.58 ml/m LA Biplane Vol: 34.5 ml 20.00 ml/m  AORTIC VALVE LVOT Vmax:   101.00 cm/s LVOT Vmean:  62.900 cm/s LVOT VTI:    0.196 m  AORTA Ao Root diam: 2.88 cm Ao Asc diam:  2.99 cm MITRAL VALVE MV Area (PHT): 2.43 cm    SHUNTS MV Decel Time: 312 msec    Systemic VTI:  0.20 m MV E velocity: 59.20 cm/s  Systemic Diam: 1.94 cm MV A velocity: 82.30 cm/s  MV E/A ratio:  0.72 Toribio Fuel MD Electronically signed by Toribio Fuel MD Signature Date/Time: 02/01/2024/8:51:25 PM    Final     Patient Profile   73 y.o. male with a hx of hypertension, hyperlipidemia, gout admitted with confusion and found to have sepsis/pneumonia who is being seen 02/01/2024 for the evaluation of atrial fibrillation/atrial flutter.  Echocardiogram shows normal LV function and grade 1 diastolic dysfunction.  Assessment & Plan  Paroxysmal atrial fibrillation/flutter-patient remains in sinus rhythm on telemetry with short runs of SVT.  Hyperadrenergic state associated with pneumonia may be contributing to atrial arrhythmias.  LV function is normal.  Will discontinue IV Cardizem  and increase metoprolol  to 50 mg twice daily.  Follow on telemetry.  CHA2DS2-VASc is 2 for age greater than 41 and hypertension.  However as outlined previously issue of anticoagulation is somewhat complicated.  He is noted to have a mild microcytic anemia and has had occasional hematochezia in the  past though none recently by his report.  He also has a history of alcohol use and a small intracerebral aneurysm.  For now we will not anticoagulate and follow closely on telemetry.  If he has more frequent episodes of atrial fibrillation then would need to reassess.  Would then plan outpatient monitor to evaluate burden. Lipoid pneumonia/sepsis-patient is on antibiotics for potential superimposed infection. Hypertension-continue metoprolol  as outlined and follow. History of alcohol use-patient admits to 5-7 alcoholic beverages weekly. Metabolic encephalopathy-improved following treatment of pneumonia. History of intracerebral aneurysm-follow-up neurosurgery as an outpatient. For questions or updates, please contact [facility] Please consult [email or web address] for contact info under     Signed, Redell Shallow, MD  02/02/2024, 9:37 AM

## 2024-02-03 DIAGNOSIS — A419 Sepsis, unspecified organism: Secondary | ICD-10-CM | POA: Diagnosis not present

## 2024-02-03 DIAGNOSIS — R6520 Severe sepsis without septic shock: Secondary | ICD-10-CM | POA: Diagnosis not present

## 2024-02-03 DIAGNOSIS — I480 Paroxysmal atrial fibrillation: Secondary | ICD-10-CM | POA: Diagnosis not present

## 2024-02-03 MED ORDER — PREDNISONE 20 MG PO TABS
20.0000 mg | ORAL_TABLET | Freq: Every day | ORAL | Status: DC
Start: 2024-02-09 — End: 2024-02-12

## 2024-02-03 MED ORDER — PREDNISONE 10 MG PO TABS
10.0000 mg | ORAL_TABLET | Freq: Every day | ORAL | Status: DC
Start: 2024-02-12 — End: 2024-02-15

## 2024-02-03 MED ORDER — AMOXICILLIN-POT CLAVULANATE 875-125 MG PO TABS
1.0000 | ORAL_TABLET | Freq: Two times a day (BID) | ORAL | Status: DC
Start: 2024-02-04 — End: 2024-02-11
  Administered 2024-02-04 – 2024-02-05 (×3): 1 via ORAL
  Filled 2024-02-03 (×4): qty 1

## 2024-02-03 MED ORDER — PREDNISONE 20 MG PO TABS
30.0000 mg | ORAL_TABLET | Freq: Every day | ORAL | Status: DC
Start: 2024-02-06 — End: 2024-02-09

## 2024-02-03 MED ORDER — LIDOCAINE 5 % EX PTCH
1.0000 | MEDICATED_PATCH | CUTANEOUS | Status: DC
  Administered 2024-02-03: 1 via TRANSDERMAL
  Filled 2024-02-03: qty 1

## 2024-02-03 MED ORDER — MELATONIN 3 MG PO TABS
3.0000 mg | ORAL_TABLET | Freq: Every day | ORAL | Status: DC
  Administered 2024-02-03 – 2024-02-04 (×2): 3 mg via ORAL
  Filled 2024-02-03 (×2): qty 1

## 2024-02-03 MED ORDER — METOPROLOL TARTRATE 50 MG PO TABS
50.0000 mg | ORAL_TABLET | Freq: Once | ORAL | Status: AC
  Administered 2024-02-03: 50 mg via ORAL
  Filled 2024-02-03: qty 1

## 2024-02-03 MED ORDER — PREDNISONE 20 MG PO TABS: 40.0000 mg | ORAL_TABLET | Freq: Every day | ORAL | Status: DC

## 2024-02-03 MED ORDER — PREDNISONE 10 MG PO TABS: 10.0000 mg | ORAL_TABLET | Freq: Every day | ORAL | Status: DC

## 2024-02-03 MED ORDER — PREDNISONE 20 MG PO TABS: 30.0000 mg | ORAL_TABLET | Freq: Every day | ORAL | Status: DC

## 2024-02-03 MED ORDER — PREDNISONE 20 MG PO TABS: 20.0000 mg | ORAL_TABLET | Freq: Every day | ORAL | Status: DC

## 2024-02-03 MED ORDER — METOPROLOL TARTRATE 100 MG PO TABS
100.0000 mg | ORAL_TABLET | Freq: Two times a day (BID) | ORAL | Status: DC
  Administered 2024-02-03 – 2024-02-05 (×4): 100 mg via ORAL
  Filled 2024-02-03 (×4): qty 1

## 2024-02-03 MED ORDER — PREDNISONE 20 MG PO TABS
40.0000 mg | ORAL_TABLET | Freq: Every day | ORAL | Status: DC
Start: 2024-02-03 — End: 2024-02-06
  Administered 2024-02-04 – 2024-02-05 (×2): 40 mg via ORAL
  Filled 2024-02-03 (×3): qty 2

## 2024-02-03 NOTE — Plan of Care (Signed)

## 2024-02-03 NOTE — Progress Notes (Signed)
 Mobility Specialist Progress Note:    02/03/24 1506  Mobility  Activity Turned to back - supine;Stood at bedside (Ankle Pumps, Heel Slides, Leg Lifts, Mini Squats)  Level of Assistance Contact guard assist, steadying assist  Assistive Device Other (Comment) (HHA)  Range of Motion/Exercises Active;Active Assistive  Activity Response Tolerated well  Mobility Referral Yes  Mobility visit 1 Mobility  Mobility Specialist Start Time (ACUTE ONLY) 1506  Mobility Specialist Stop Time (ACUTE ONLY) 1514  Mobility Specialist Time Calculation (min) (ACUTE ONLY) 8 min   Received pt laying in bed not agreeable to ambulation but agreeable to bed mobility. No c/o any symptoms. Pt able to perform movements decently w/ HHA and assistive assistance. Returned pt to bed w/ all needs met.   Venetia Keel Mobility Specialist Please Neurosurgeon or Rehab Office at [phone]

## 2024-02-03 NOTE — Progress Notes (Signed)
 PROGRESS NOTE  Logan Phillips FMW:[id number] DOB: 03-29-1950 DOA: 01/28/2024 PCP: Bulah Alm RAMAN, PA-C   LOS: 6 days   Brief Narrative / Interim history: 73 year old male with history of lipoid/necrotizing pneumonia, HTN, gout, EtOH use who presented to the hospital with confusion.  He apparently was playing golf with his friends and suddenly became confused.  He was found to be febrile to 102.4 on admission, tachycardic, had a leukocytosis and imaging was concerning for worsening pneumonia.  He was placed on antibiotics and admitted to the hospital.  COVID, influenza, RSV all negative.  Hospital course complicated by new onset A-fib with RVR 11/19  Subjective / 24h Interval events: Complains of left flank pain still.  Denies any chest discomfort, no palpitations.  Denies any shortness of breath.  Assesement and Plan: Principal problem Severe sepsis -patient presented to the hospital with high-grade fever, tachycardia, tachypnea, elevated white count.  CT scan of the chest showed known history of lipoid pneumonia, slightly progressed since prior study.  Suspect superimposed infection, started on antibiotics, will continue for now, plan for total of 7 days, 2 days left - COVID, influenza, RSV all negative - Sepsis physiology improving  Active problems Paroxysmal A-fib / flutter with RVR -new onset 11/19, he is relatively asymptomatic. Cards consulted, appreciate input, continue to uptitrate metoprolol  today.  He is off Cardizem  infusion  History of recurrent lipoid pneumonia - History of recurrent  pneumonia pneumonia. Follows with Dr Meade. Productive cough over past 1 year no dyspnea.  Works as a administrator, public affairs consultant .  Never smoked.  Currently on room air.  Speech therapy evaluated the patient and recommended regular diet.  We recommend to follow-up with Dr. Meade, pulmonology as an outpatient after discharge.   Hyponatremia -sodium overall stable, no large shifts noted    Acute metabolic encephalopathy - In the setting of severe sepsis.  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  Case was discussed with neurology.  MRI did not show any acute intracranial abnormalities.   Alcohol use - Drinks 1-2 beers or vodka about 4 times a week.  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  Case discussed with neurology.  Recommend outpatient follow-up with neurosurgery.  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   Hypertension -blood pressure is stable and acceptable  Hyponatremia-no large shifts, monitor  Gout - Continue allopurinol    Hypophosphatemia -continue to monitor and supplement as indicated   Poor oral intake/malnutrition - Mentioned that he has been eating poorly and losing weight since last few months.  We consulted dietitian   Deconditioning/debility - PT consulted   Scheduled Meds:  allopurinol   100 mg Oral Daily   Chlorhexidine  Gluconate Cloth  6 each Topical Daily   enoxaparin  (LOVENOX ) injection  40 mg Subcutaneous Daily   feeding supplement  237 mL Oral BID BM   folic acid   1 mg Oral Daily   guaiFENesin   600 mg Oral BID   lidocaine   1 patch Transdermal Q24H   metoprolol  tartrate  100 mg Oral BID   metoprolol  tartrate  50 mg Oral Once   multivitamin with minerals  1 tablet Oral Daily   mupirocin  ointment  1 Application Nasal BID   phosphorus  250 mg Oral TID   rosuvastatin   10 mg Oral Daily   thiamine   100  mg Oral Daily   Or   thiamine   100 mg Intravenous Daily   Continuous Infusions:  cefTRIAXone  (ROCEPHIN )  IV 2 g (02/02/24 1739)   vancomycin      PRN Meds:.acetaminophen  **OR** acetaminophen , albuterol , menthol , ondansetron  **OR** ondansetron  (ZOFRAN ) IV, mouth rinse, oxyCODONE , polyethylene glycol  Current Outpatient Medications  Medication Instructions    acetaminophen  (TYLENOL ) 1,000 mg, Oral, Every 6 hours PRN   albuterol  (PROVENTIL ) 2.5 mg, Nebulization, Every 6 hours PRN   albuterol  (VENTOLIN  HFA) 108 (90 Base) MCG/ACT inhaler 2 puffs, Inhalation, Every 6 hours PRN   allopurinol  (ZYLOPRIM ) 100 mg, Oral, Daily   amphetamine -dextroamphetamine  (ADDERALL) 10 MG tablet 10 mg, Oral, Daily with breakfast   docusate sodium  (COLACE) 100 mg, Oral, 2 times daily   lisinopril  (ZESTRIL ) 2.5 MG tablet TAKE 1 TABLET(2.5 MG) BY MOUTH IN THE MORNING AND AT BEDTIME   meloxicam  (MOBIC ) 15 MG tablet TAKE 1 TABLET(15 MG) BY MOUTH DAILY   metoprolol  tartrate (LOPRESSOR ) 100 mg, Daily   Multiple Vitamin (MULITIVITAMIN WITH MINERALS) TABS 1 tablet, Daily   Oxycodone  HCl 10 mg, Oral, 5 times daily   rosuvastatin  (CRESTOR ) 10 mg, Oral, Daily   Testosterone  25 MG/2.5GM (1%) GEL 2 Pump, Topical, Daily    Diet Orders (From admission, onward)     Start     Ordered   01/31/24 1515  Diet regular Room service appropriate? Yes; Fluid consistency: Thin  Diet effective now       Question Answer Comment  Room service appropriate? Yes   Fluid consistency: Thin      01/31/24 1514            DVT prophylaxis: enoxaparin  (LOVENOX ) injection 40 mg Start: 01/29/24 1000   Lab Results  Component Value Date   PLT 249 02/02/2024      Code Status: Full Code  Family Communication: No family at bedside  Status is: Inpatient Remains inpatient appropriate because: Severity of illness  Level of care: Progressive  Consultants:  Cardiology  Objective: Vitals:   02/02/24 2010 02/02/24 2324 02/03/24 0333 02/03/24 0823  BP: (!) 148/80 (!) 154/96 (!) 157/74 (!) 158/80  Pulse: 89 (!) 50 89 99  Resp: 18 20 19 18   Temp: 99 F (37.2 C) 99.9 F (37.7 C) 98.6 F (37 C) 99.2 F (37.3 C)  TempSrc: Oral Oral Oral Oral  SpO2: 95% 93% 92% 93%  Weight:      Height:        Intake/Output Summary (Last 24 hours) at 02/03/2024 0950 Last data filed at 02/03/2024  0900 Gross per 24 hour  Intake 120 ml  Output 800 ml  Net -680 ml   Wt Readings from Last 3 Encounters:  02/01/24 64.8 kg  11/21/23 61.1 kg  10/27/23 63.8 kg    Examination:  Constitutional: NAD Eyes: lids and conjunctivae normal, no scleral icterus ENMT: mmm Neck: normal, supple Respiratory: clear to auscultation bilaterally, no wheezing, no crackles. Normal respiratory effort.  Cardiovascular: Regular rate and rhythm, no murmurs / rubs / gallops. Abdomen: soft, no distention, no tenderness. Bowel sounds positive.    Data Reviewed: I have independently reviewed following labs and imaging studies   CBC Recent Labs  Lab 01/28/24 1723 01/28/24 1747 01/29/24 0158 01/30/24 0425 02/01/24 0401 02/02/24 0242  WBC 13.1*  --  14.7* 9.5 9.9 11.2*  HGB 11.4* 12.2* 10.3* 9.5* 11.1* 11.3*  HCT 34.4* 36.0* 31.1* 28.9* 33.8* 34.5*  PLT 218  --  178 163 220 249  MCV 79.8*  --  79.3* 79.8* 78.6* 78.2*  MCH 26.5  --  26.3 26.2 25.8* 25.6*  MCHC 33.1  --  33.1 32.9 32.8 32.8  RDW 14.0  --  14.1 13.9 13.9 13.9  LYMPHSABS 0.6*  --   --  0.7  --   --   MONOABS 0.5  --   --  0.5  --   --   EOSABS 0.0  --   --  0.1  --   --   BASOSABS 0.0  --   --  0.0  --   --     Recent Labs  Lab 01/28/24 1723 01/28/24 1744 01/28/24 1747 01/29/24 0158 01/30/24 0425 01/30/24 0426 02/01/24 0401 02/02/24 0242  NA 130*  --  130* 131*  --  135 132* 132*  K 4.6  --  4.6 4.1  --  4.1 3.7 3.5  CL 94*  --   --  96*  --  101 93* 94*  CO2 24  --   --  23  --  24 26 26   GLUCOSE 121*  --   --  110*  --  99 114* 113*  BUN 23  --   --  19  --  15 10 12   CREATININE 1.47*  --   --  1.29*  --  1.21 0.98 1.08  CALCIUM  9.4  --   --  8.7*  --  8.1* 8.2* 8.2*  AST 17  --   --   --   --   --   --  12*  ALT 10  --   --   --   --   --   --  10  ALKPHOS 70  --   --   --   --   --   --  57  BILITOT 1.0  --   --   --   --   --   --  0.6  ALBUMIN 3.8  --   --   --   --  2.4*  --  2.3*  MG  --   --   --  1.9 2.0   --   --  1.9  PROCALCITON  --   --   --  2.33  --   --   --   --   LATICACIDVEN  --  1.0  --   --   --   --   --   --   INR 1.2  --   --   --   --   --   --   --   AMMONIA 23  --   --   --   --   --   --   --     ------------------------------------------------------------------------------------------------------------------ No results for input(s): CHOL, HDL, LDLCALC, TRIG, CHOLHDL, LDLDIRECT in the last 72 hours.  Lab Results  Component Value Date   HGBA1C 4.8 10/05/2017   ------------------------------------------------------------------------------------------------------------------ No results for input(s): TSH, T4TOTAL, T3FREE, THYROIDAB in the last 72 hours.  Invalid input(s): FREET3  Cardiac Enzymes No results for input(s): CKMB, TROPONINI, MYOGLOBIN in the last 168 hours.  Invalid input(s): CK ------------------------------------------------------------------------------------------------------------------    Component Value Date/Time   BNP 35.1 12/07/2021 2149    CBG: Recent Labs  Lab 01/28/24 1540 01/31/24 2232  GLUCAP 134* 117*    Recent Results (from the past 240 hours)  Resp panel by RT-PCR (RSV, Flu A&B, Covid) Anterior Nasal Swab     Status: None   Collection  Time: 01/28/24  3:56 PM   Specimen: Anterior Nasal Swab  Result Value Ref Range Status   SARS Coronavirus 2 by RT PCR NEGATIVE NEGATIVE Final   Influenza A by PCR NEGATIVE NEGATIVE Final   Influenza B by PCR NEGATIVE NEGATIVE Final    Comment: (NOTE) The Xpert Xpress SARS-CoV-2/FLU/RSV plus assay is intended as an aid in the diagnosis of influenza from Nasopharyngeal swab specimens and should not be used as a sole basis for treatment. Nasal washings and aspirates are unacceptable for Xpert Xpress SARS-CoV-2/FLU/RSV testing.  Fact Sheet for Patients: bloggercourse.com  Fact Sheet for Healthcare  Providers: seriousbroker.it  This test is not yet approved or cleared by the [place]  FDA and has been authorized for detection and/or diagnosis of SARS-CoV-2 by FDA under an Emergency Use Authorization (EUA). This EUA will remain in effect (meaning this test can be used) for the duration of the COVID-19 declaration under Section 564(b)(1) of the Act, 21 U.S.C. section 360bbb-3(b)(1), unless the authorization is terminated or revoked.     Resp Syncytial Virus by PCR NEGATIVE NEGATIVE Final    Comment: (NOTE) Fact Sheet for Patients: bloggercourse.com  Fact Sheet for Healthcare Providers: seriousbroker.it  This test is not yet approved or cleared by the [place]  FDA and has been authorized for detection and/or diagnosis of SARS-CoV-2 by FDA under an Emergency Use Authorization (EUA). This EUA will remain in effect (meaning this test can be used) for the duration of the COVID-19 declaration under Section 564(b)(1) of the Act, 21 U.S.C. section 360bbb-3(b)(1), unless the authorization is terminated or revoked.  Performed at [facility], [address], [place], [place] [address]   Culture, blood (Routine x 2)     Status: None   Collection Time: 01/28/24  5:20 PM   Specimen: BLOOD LEFT ARM  Result Value Ref Range Status   Specimen Description BLOOD LEFT ARM  Final   Special Requests   Final    BOTTLES DRAWN AEROBIC AND ANAEROBIC Blood Culture adequate volume   Culture   Final    NO GROWTH 5 DAYS Performed at [facility], [address], [place], [place] [address]    Report Status 02/02/2024 FINAL  Final  Culture, blood (Routine x 2)     Status: None   Collection Time: 01/28/24  5:52 PM   Specimen: BLOOD LEFT ARM  Result Value Ref Range Status   Specimen Description BLOOD LEFT ARM  Final   Special Requests   Final    BOTTLES DRAWN AEROBIC ONLY Blood Culture results may not be  optimal due to an inadequate volume of blood received in culture bottles   Culture   Final    NO GROWTH 5 DAYS Performed at [facility], [address] [place], [place] [address]    Report Status 02/02/2024 FINAL  Final  Respiratory (~20 pathogens) panel by PCR     Status: None   Collection Time: 01/28/24  8:17 PM   Specimen: Nasopharyngeal Swab; Respiratory  Result Value Ref Range Status   Adenovirus NOT DETECTED NOT DETECTED Final   Coronavirus 229E NOT DETECTED NOT DETECTED Final    Comment: (NOTE) The Coronavirus on the Respiratory Panel, DOES NOT test for the novel  Coronavirus (2019 nCoV)    Coronavirus HKU1 NOT DETECTED NOT DETECTED Final   Coronavirus NL63 NOT DETECTED NOT DETECTED Final   Coronavirus OC43 NOT DETECTED NOT DETECTED Final   Metapneumovirus NOT DETECTED NOT DETECTED Final  Rhinovirus / Enterovirus NOT DETECTED NOT DETECTED Final   Influenza A NOT DETECTED NOT DETECTED Final   Influenza B NOT DETECTED NOT DETECTED Final   Parainfluenza Virus 1 NOT DETECTED NOT DETECTED Final   Parainfluenza Virus 2 NOT DETECTED NOT DETECTED Final   Parainfluenza Virus 3 NOT DETECTED NOT DETECTED Final   Parainfluenza Virus 4 NOT DETECTED NOT DETECTED Final   Respiratory Syncytial Virus NOT DETECTED NOT DETECTED Final   Bordetella pertussis NOT DETECTED NOT DETECTED Final   Bordetella Parapertussis NOT DETECTED NOT DETECTED Final   Chlamydophila pneumoniae NOT DETECTED NOT DETECTED Final   Mycoplasma pneumoniae NOT DETECTED NOT DETECTED Final    Comment: Performed at [facility], [address], [place], [place] [address]  Urine Culture (for pregnant, neutropenic or urologic patients or patients with an indwelling urinary catheter)     Status: None   Collection Time: 01/28/24 10:10 PM   Specimen: Urine, Clean Catch  Result Value Ref Range Status   Specimen Description URINE, CLEAN CATCH  Final   Special Requests NONE  Final   Culture   Final    NO  GROWTH Performed at [facility], [address], [place], [place] [address]    Report Status 01/30/2024 FINAL  Final  MRSA Next Gen by PCR, Nasal     Status: Abnormal   Collection Time: 01/31/24  8:12 AM   Specimen: Nasal Mucosa; Nasal Swab  Result Value Ref Range Status   MRSA by PCR Next Gen DETECTED (A) NOT DETECTED Final    Comment: RESULT CALLED TO, READ BACK BY AND VERIFIED WITH: RN J.NARAMDAS AT 1254 ON 01/31/2024 BY T.SAAD. (NOTE) The GeneXpert MRSA Assay (FDA approved for NASAL specimens only), is one component of a comprehensive MRSA colonization surveillance program. It is not intended to diagnose MRSA infection nor to guide or monitor treatment for MRSA infections. Test performance is not FDA approved in patients less than 77 years old. Performed at [facility], [address], [place], [place] [address]     Radiology Studies: No results found.   CRITICAL CARE Performed by: Nilda Fendt   Total critical care time: 40 minutes  Critical care time was exclusive of separately billable procedures and treating other patients.  Critical care was necessary to treat or prevent imminent or life-threatening deterioration.  Critical care was time spent personally by me on the following activities: bedside evaluation of new onset A fib with RVR with rates up to 200, evaluate hemodynamic stability, attempted BB with no significant the rate, transition to Cardizem  continuous IV infusion, development of treatment plan with patient and/or surrogate as well as nursing, discussions with cardiology consultants, evaluation of patient's response to treatment, examination of patient, obtaining history from patient or surrogate, ordering and performing treatments and interventions, ordering and review of laboratory studies, ordering and review of radiographic studies, pulse oximetry and re-evaluation of patient's condition.   Nilda Fendt, MD, PhD [facility]  Between 7 am - 7 pm I am available, please contact me via Amion (for emergencies) or Securechat (non urgent messages)  Between 7 pm - 7 am I am not available, please contact night coverage MD/APP via Amion

## 2024-02-03 NOTE — Progress Notes (Signed)
 Rounding Note   Patient Name: Logan Phillips Date of Encounter: 02/03/2024  [facility] Cardiologist: Redell Shallow, MD   Subjective Patient has chest pain on the left side with inspiration; pt denies dyspnea  Scheduled Meds:  allopurinol   100 mg Oral Daily   Chlorhexidine  Gluconate Cloth  6 each Topical Daily   enoxaparin  (LOVENOX ) injection  40 mg Subcutaneous Daily   feeding supplement  237 mL Oral BID BM   folic acid   1 mg Oral Daily   guaiFENesin   600 mg Oral BID   lidocaine   1 patch Transdermal Q24H   metoprolol  tartrate  50 mg Oral BID   multivitamin with minerals  1 tablet Oral Daily   mupirocin  ointment  1 Application Nasal BID   phosphorus  250 mg Oral TID   rosuvastatin   10 mg Oral Daily   thiamine   100 mg Oral Daily   Or   thiamine   100 mg Intravenous Daily   Continuous Infusions:  cefTRIAXone  (ROCEPHIN )  IV 2 g (02/02/24 1739)   vancomycin      PRN Meds: acetaminophen  **OR** acetaminophen , albuterol , menthol , ondansetron  **OR** ondansetron  (ZOFRAN ) IV, mouth rinse, oxyCODONE , polyethylene glycol   Vital Signs  Vitals:   02/02/24 1734 02/02/24 2010 02/02/24 2324 02/03/24 0333  BP: (!) 128/91 (!) 148/80 (!) 154/96 (!) 157/74  Pulse:  89 (!) 50 89  Resp:  18 20 19   Temp: 97.8 F (36.6 C) 99 F (37.2 C) 99.9 F (37.7 C) 98.6 F (37 C)  TempSrc: Oral Oral Oral Oral  SpO2:  95% 93% 92%  Weight:      Height:        Intake/Output Summary (Last 24 hours) at 02/03/2024 0839 Last data filed at 02/02/2024 2326 Gross per 24 hour  Intake 360 ml  Output 650 ml  Net -290 ml      02/01/2024   11:09 AM 01/29/2024    1:13 PM 01/28/2024    3:36 PM  Last 3 Weights  Weight (lbs) 142 lb 13.7 oz 137 lb 9.1 oz 150 lb  Weight (kg) 64.8 kg 62.4 kg 68.04 kg      Telemetry Sinus with short runs of SVT, PACs noted- Personally Reviewed   Physical Exam  GEN: NAD   Neck: supple Cardiac: RRR Respiratory: diminished BS GI: Soft, NT/ND  MS: No  edema Neuro:  Grossly  intact Psych: Normal affect   Labs  Chemistry Recent Labs  Lab 01/28/24 1723 01/28/24 1747 01/29/24 0158 01/30/24 0425 01/30/24 0426 02/01/24 0401 02/02/24 0242  NA 130*   < > 131*  --  135 132* 132*  K 4.6   < > 4.1  --  4.1 3.7 3.5  CL 94*  --  96*  --  101 93* 94*  CO2 24  --  23  --  24 26 26   GLUCOSE 121*  --  110*  --  99 114* 113*  BUN 23  --  19  --  15 10 12   CREATININE 1.47*  --  1.29*  --  1.21 0.98 1.08  CALCIUM  9.4  --  8.7*  --  8.1* 8.2* 8.2*  MG  --   --  1.9 2.0  --   --  1.9  PROT 7.9  --   --   --   --   --  6.2*  ALBUMIN 3.8  --   --   --  2.4*  --  2.3*  AST 17  --   --   --   --   --  12*  ALT 10  --   --   --   --   --  10  ALKPHOS 70  --   --   --   --   --  57  BILITOT 1.0  --   --   --   --   --  0.6  GFRNONAA 50*  --  59*  --  >60 >60 >60  ANIONGAP 12  --  12  --  10 13 12    < > = values in this interval not displayed.    Hematology Recent Labs  Lab 01/30/24 0425 02/01/24 0401 02/01/24 1118 02/02/24 0242  WBC 9.5 9.9  --  11.2*  RBC 3.62* 4.30 4.80 4.41  HGB 9.5* 11.1*  --  11.3*  HCT 28.9* 33.8*  --  34.5*  MCV 79.8* 78.6*  --  78.2*  MCH 26.2 25.8*  --  25.6*  MCHC 32.9 32.8  --  32.8  RDW 13.9 13.9  --  13.9  PLT 163 220  --  249    Radiology  ECHOCARDIOGRAM COMPLETE Result Date: 02/01/2024    ECHOCARDIOGRAM REPORT   Patient Name:   Logan Phillips Date of Exam: 02/01/2024 Medical Rec #:  [id number]      Height:       67.0 in Accession #:    [id number]     Weight:       137.6 lb Date of Birth:  1950/09/25      BSA:          1.725 m Patient Age:    73 years       BP:           127/81 mmHg Patient Gender: M              HR:           70 bpm. Exam Location:  Inpatient Procedure: 2D Echo, 3D Echo, Cardiac Doppler and Color Doppler (Both Spectral            and Color Flow Doppler were utilized during procedure). Indications:    Atrial fibrillation  History:        Patient has prior history of Echocardiogram  examinations, most                 recent 10/01/2022. CAD; Risk Factors:Hypertension and                 Dyslipidemia.  Sonographer:    Philomena Daring Referring Phys: [id number] NILDA HERO GHERGHE IMPRESSIONS  1. Left ventricular ejection fraction, by estimation, is 60 to 65%. Left ventricular ejection fraction by 3D volume is 58 %. The left ventricle has normal function. The left ventricle has no regional wall motion abnormalities. There is mild concentric left ventricular hypertrophy. Left ventricular diastolic parameters are consistent with Grade I diastolic dysfunction (impaired relaxation).  2. Right ventricular systolic function is normal. The right ventricular size is normal.  3. The mitral valve is normal in structure. Trivial mitral valve regurgitation. No evidence of mitral stenosis.  4. The aortic valve is tricuspid. There is moderate calcification of the aortic valve. Aortic valve regurgitation is not visualized. No aortic stenosis is present.  5. The inferior vena cava is normal in size with greater than 50% respiratory variability, suggesting right atrial pressure of 3 mmHg. FINDINGS  Left Ventricle: Left ventricular ejection fraction, by estimation, is 60 to 65%. Left ventricular ejection fraction by 3D volume is 58 %. The left  ventricle has normal function. The left ventricle has no regional wall motion abnormalities. The left ventricular internal cavity size was normal in size. There is mild concentric left ventricular hypertrophy. Left ventricular diastolic parameters are consistent with Grade I diastolic dysfunction (impaired relaxation). Right Ventricle: The right ventricular size is normal. No increase in right ventricular wall thickness. Right ventricular systolic function is normal. Left Atrium: Left atrial size was normal in size. Right Atrium: Right atrial size was normal in size. Pericardium: There is no evidence of pericardial effusion. Mitral Valve: The mitral valve is normal in structure. Trivial  mitral valve regurgitation. No evidence of mitral valve stenosis. Tricuspid Valve: The tricuspid valve is normal in structure. Tricuspid valve regurgitation is trivial. No evidence of tricuspid stenosis. Aortic Valve: The aortic valve is tricuspid. There is moderate calcification of the aortic valve. Aortic valve regurgitation is not visualized. No aortic stenosis is present. Pulmonic Valve: The pulmonic valve was not well visualized. Pulmonic valve regurgitation is not visualized. No evidence of pulmonic stenosis. Aorta: The aortic root is normal in size and structure. Venous: The inferior vena cava is normal in size with greater than 50% respiratory variability, suggesting right atrial pressure of 3 mmHg. IAS/Shunts: No atrial level shunt detected by color flow Doppler. Additional Comments: 3D was performed not requiring image post processing on an independent workstation and was normal.  LEFT VENTRICLE PLAX 2D LVIDd:         4.10 cm         Diastology LVIDs:         2.90 cm         LV e' medial:    6.42 cm/s LV PW:         1.10 cm         LV E/e' medial:  9.2 LV IVS:        1.10 cm         LV e' lateral:   6.09 cm/s LVOT diam:     1.94 cm         LV E/e' lateral: 9.7 LV SV:         58 LV SV Index:   34 LVOT Area:     2.96 cm        3D Volume EF                                LV 3D EF:    Left                                             ventricul                                             ar                                             ejection  fraction                                             by 3D                                             volume is                                             58 %.                                 3D Volume EF:                                3D EF:        58 %                                LV EDV:       137 ml                                LV ESV:       58 ml                                LV SV:        79 ml RIGHT VENTRICLE              IVC RV Basal diam:  2.39 cm     IVC diam: 1.41 cm RV Mid diam:    1.88 cm RV S prime:     10.00 cm/s TAPSE (M-mode): 1.9 cm LEFT ATRIUM             Index        RIGHT ATRIUM          Index LA diam:        2.56 cm 1.48 cm/m   RA Area:     8.89 cm LA Vol (A2C):   35.4 ml 20.52 ml/m  RA Volume:   15.50 ml 8.99 ml/m LA Vol (A4C):   28.6 ml 16.58 ml/m LA Biplane Vol: 34.5 ml 20.00 ml/m  AORTIC VALVE LVOT Vmax:   101.00 cm/s LVOT Vmean:  62.900 cm/s LVOT VTI:    0.196 m  AORTA Ao Root diam: 2.88 cm Ao Asc diam:  2.99 cm MITRAL VALVE MV Area (PHT): 2.43 cm    SHUNTS MV Decel Time: 312 msec    Systemic VTI:  0.20 m MV E velocity: 59.20 cm/s  Systemic Diam: 1.94 cm MV A velocity: 82.30 cm/s MV E/A ratio:  0.72 Toribio Fuel MD Electronically signed by Toribio Fuel MD Signature Date/Time: 02/01/2024/8:51:25 PM    Final     Patient Profile   73 y.o. male with a hx of hypertension, hyperlipidemia, gout admitted with confusion and found  to have sepsis/pneumonia who is being seen 02/01/2024 for the evaluation of atrial fibrillation/atrial flutter.  Echocardiogram shows normal LV function and grade 1 diastolic dysfunction.  Assessment & Plan  Paroxysmal atrial fibrillation/flutter-patient remains in sinus rhythm on telemetry with continued short runs of SVT.  Hyperadrenergic state associated with pneumonia may be contributing to atrial arrhythmias.  LV function is normal.  Will increase metoprolol  to 100 mg twice daily.  Continue to follow on telemetry.  CHA2DS2-VASc is 2 for age greater than 49 and hypertension.  However as outlined previously issue of anticoagulation is somewhat complicated.  He is noted to have a mild microcytic anemia and has had occasional hematochezia in the past though none recently by his report.  He also has a history of alcohol use and a small intracerebral aneurysm.  For now we will not anticoagulate and follow closely on telemetry.  If he has more frequent episodes of atrial  fibrillation then would need to reassess.  Would then plan outpatient monitor to evaluate burden. Lipoid pneumonia/sepsis-patient is on antibiotics for potential superimposed infection. Hypertension-blood pressure mildly elevated.  Advance metoprolol  as outlined above. History of alcohol use-patient admits to 5-7 alcoholic beverages weekly. Metabolic encephalopathy-improved following treatment of pneumonia. History of intracerebral aneurysm-follow-up neurosurgery as an outpatient. For questions or updates, please contact [facility] Please consult [email or web address] for contact info under     Signed, Redell Shallow, MD  02/03/2024, 8:39 AM

## 2024-02-04 DIAGNOSIS — I480 Paroxysmal atrial fibrillation: Secondary | ICD-10-CM | POA: Diagnosis not present

## 2024-02-04 DIAGNOSIS — A419 Sepsis, unspecified organism: Secondary | ICD-10-CM | POA: Diagnosis not present

## 2024-02-04 DIAGNOSIS — R6520 Severe sepsis without septic shock: Secondary | ICD-10-CM | POA: Diagnosis not present

## 2024-02-04 MED ORDER — SENNOSIDES-DOCUSATE SODIUM 8.6-50 MG PO TABS
2.0000 | ORAL_TABLET | Freq: Two times a day (BID) | ORAL | Status: DC
  Administered 2024-02-04 – 2024-02-05 (×2): 2 via ORAL
  Filled 2024-02-04 (×3): qty 2

## 2024-02-04 MED ORDER — GUAIFENESIN 100 MG/5ML PO LIQD
200.0000 mg | ORAL | Status: DC
  Administered 2024-02-04 – 2024-02-05 (×4): 200 mg via ORAL
  Filled 2024-02-04: qty 10
  Filled 2024-02-04: qty 20
  Filled 2024-02-04 (×2): qty 10

## 2024-02-04 MED ORDER — LIDOCAINE 5 % EX PTCH
2.0000 | MEDICATED_PATCH | CUTANEOUS | Status: DC
  Administered 2024-02-04 – 2024-02-05 (×2): 2 via TRANSDERMAL
  Filled 2024-02-04 (×2): qty 2

## 2024-02-04 NOTE — Progress Notes (Signed)
 PROGRESS NOTE  Logan Phillips FMW:[id number] DOB: [birth year] DOA: 01/28/2024 PCP: Bulah Alm RAMAN, PA-C   LOS: 7 days   Brief Narrative / Interim history: 73 year old male with history of lipoid/necrotizing pneumonia, HTN, gout, EtOH use who presented to the hospital with confusion.  He apparently was playing golf with his friends and suddenly became confused.  He was found to be febrile to 102.4 on admission, tachycardic, had a leukocytosis and imaging was concerning for worsening pneumonia.  He was placed on antibiotics and admitted to the hospital.  COVID, influenza, RSV all negative.  Hospital course complicated by new onset A-fib with RVR 11/19  Subjective / 24h Interval events: Still has left flank pain, lidocaine  patch is working but not lasting long enough.  It is pleuritic in nature.  Assesement and Plan: Principal problem Severe sepsis -patient presented to the hospital with high-grade fever, tachycardia, tachypnea, elevated white count.  CT scan of the chest showed known history of lipoid pneumonia, slightly progressed since prior study.  Suspect superimposed infection, started on antibiotics.  Discussed with pulmonology over the phone 11/21, plan to do longer course of antibiotics, has been switched to Augmentin  and will do 7 additional days.  Pulmonary also recommended steroids, started this morning - COVID, influenza, RSV all negative - Sepsis physiology improving  Active problems Paroxysmal A-fib / flutter with RVR -new onset 11/19, he is relatively asymptomatic. Cards consulted, appreciate input, continue metoprolol .  He was briefly on Cardizem  infusion, now back to sinus rhythm  History of recurrent lipoid pneumonia - History of recurrent  pneumonia pneumonia. Follows with Dr Meade. Productive cough over past 1 year no dyspnea.  Works as a administrator, public affairs consultant, has also been sipping on mineral oil for his chronic constipation.  Never smoked.  Currently on room  air.  Speech therapy evaluated the patient and recommended regular diet.  Will need pulm follow-up  Constipation-continue MiraLAX , add Senokot today   Hyponatremia -sodium overall stable, no large shifts noted   Acute metabolic encephalopathy - In the setting of severe sepsis.  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  Case was discussed with neurology.  MRI did not show any acute intracranial abnormalities.   Alcohol use - Drinks 1-2 beers or vodka about 4 times a week.  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  Case discussed with neurology.  Recommend outpatient follow-up with neurosurgery.  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   Hypertension -blood pressure is stable and acceptable  Hyponatremia-no large shifts, monitor  Gout - Continue allopurinol    Hypophosphatemia -continue to monitor and supplement as indicated   Poor oral intake/malnutrition - Mentioned that he has been eating poorly and losing weight since last few months.  We consulted dietitian   Deconditioning/debility - PT consulted   Scheduled Meds:  allopurinol   100 mg Oral Daily   amoxicillin -clavulanate  1 tablet Oral Q12H   Chlorhexidine  Gluconate Cloth  6 each Topical Daily   enoxaparin  (LOVENOX ) injection  40 mg Subcutaneous Daily   feeding supplement  237 mL Oral BID BM   folic acid   1 mg Oral Daily   guaiFENesin   200 mg Oral Q4H   [START ON 02/05/2024] lidocaine   2 patch Transdermal Q24H   melatonin  3 mg Oral QHS   metoprolol  tartrate  100 mg  Oral BID   multivitamin with minerals  1 tablet Oral Daily   mupirocin  ointment  1 Application Nasal BID   phosphorus  250 mg Oral TID   predniSONE   40 mg Oral Q breakfast   Followed by   NOREEN ON 02/06/2024] predniSONE   30 mg Oral Q breakfast   Followed by   NOREEN ON  02/09/2024] predniSONE   20 mg Oral Q breakfast   Followed by   NOREEN ON 02/12/2024] predniSONE   10 mg Oral Q breakfast   rosuvastatin   10 mg Oral Daily   thiamine   100 mg Oral Daily   Or   thiamine   100 mg Intravenous Daily   Continuous Infusions:   PRN Meds:.acetaminophen  **OR** acetaminophen , albuterol , menthol , ondansetron  **OR** ondansetron  (ZOFRAN ) IV, mouth rinse, oxyCODONE , polyethylene glycol  Current Outpatient Medications  Medication Instructions   acetaminophen  (TYLENOL ) 1,000 mg, Oral, Every 6 hours PRN   albuterol  (PROVENTIL ) 2.5 mg, Nebulization, Every 6 hours PRN   albuterol  (VENTOLIN  HFA) 108 (90 Base) MCG/ACT inhaler 2 puffs, Inhalation, Every 6 hours PRN   allopurinol  (ZYLOPRIM ) 100 mg, Oral, Daily   amphetamine -dextroamphetamine  (ADDERALL) 10 MG tablet 10 mg, Oral, Daily with breakfast   docusate sodium  (COLACE) 100 mg, Oral, 2 times daily   lisinopril  (ZESTRIL ) 2.5 MG tablet TAKE 1 TABLET(2.5 MG) BY MOUTH IN THE MORNING AND AT BEDTIME   meloxicam  (MOBIC ) 15 MG tablet TAKE 1 TABLET(15 MG) BY MOUTH DAILY   metoprolol  tartrate (LOPRESSOR ) 100 mg, Daily   Multiple Vitamin (MULITIVITAMIN WITH MINERALS) TABS 1 tablet, Daily   Oxycodone  HCl 10 mg, Oral, 5 times daily   rosuvastatin  (CRESTOR ) 10 mg, Oral, Daily   Testosterone  25 MG/2.5GM (1%) GEL 2 Pump, Topical, Daily    Diet Orders (From admission, onward)     Start     Ordered   01/31/24 1515  Diet regular Room service appropriate? Yes; Fluid consistency: Thin  Diet effective now       Question Answer Comment  Room service appropriate? Yes   Fluid consistency: Thin      01/31/24 1514            DVT prophylaxis: enoxaparin  (LOVENOX ) injection 40 mg Start: 01/29/24 1000   Lab Results  Component Value Date   PLT 249 02/02/2024      Code Status: Full Code  Family Communication: No family at bedside  Status is: Inpatient Remains inpatient appropriate because: Severity of illness  Level of care:  Progressive  Consultants:  Cardiology  Objective: Vitals:   02/03/24 1922 02/04/24 0100 02/04/24 0400 02/04/24 0913  BP: (!) 161/92 (!) 168/82 (!) 144/78 (!) 148/81  Pulse: 100 74 74 88  Resp: 20 18 19 20   Temp: 99.2 F (37.3 C) 98.3 F (36.8 C) 98.9 F (37.2 C) 98.3 F (36.8 C)  TempSrc: Oral Oral Oral Oral  SpO2: 98% 95% 92% 96%  Weight:      Height:        Intake/Output Summary (Last 24 hours) at 02/04/2024 1007 Last data filed at 02/03/2024 2200 Gross per 24 hour  Intake 340 ml  Output 550 ml  Net -210 ml   Wt Readings from Last 3 Encounters:  02/01/24 64.8 kg  11/21/23 61.1 kg  10/27/23 63.8 kg    Examination:  Constitutional: NAD Eyes: lids and conjunctivae normal, no scleral icterus ENMT: mmm Neck: normal, supple Respiratory: clear to auscultation bilaterally, no wheezing, no crackles. Normal respiratory effort.  Cardiovascular: Regular rate and rhythm, no murmurs / rubs /  gallops. No LE edema. Abdomen: soft, no distention, no tenderness. Bowel sounds positive.   Data Reviewed: I have independently reviewed following labs and imaging studies   CBC Recent Labs  Lab 01/28/24 1723 01/28/24 1747 01/29/24 0158 01/30/24 0425 02/01/24 0401 02/02/24 0242  WBC 13.1*  --  14.7* 9.5 9.9 11.2*  HGB 11.4* 12.2* 10.3* 9.5* 11.1* 11.3*  HCT 34.4* 36.0* 31.1* 28.9* 33.8* 34.5*  PLT 218  --  178 163 220 249  MCV 79.8*  --  79.3* 79.8* 78.6* 78.2*  MCH 26.5  --  26.3 26.2 25.8* 25.6*  MCHC 33.1  --  33.1 32.9 32.8 32.8  RDW 14.0  --  14.1 13.9 13.9 13.9  LYMPHSABS 0.6*  --   --  0.7  --   --   MONOABS 0.5  --   --  0.5  --   --   EOSABS 0.0  --   --  0.1  --   --   BASOSABS 0.0  --   --  0.0  --   --     Recent Labs  Lab 01/28/24 1723 01/28/24 1744 01/28/24 1747 01/29/24 0158 01/30/24 0425 01/30/24 0426 02/01/24 0401 02/02/24 0242  NA 130*  --  130* 131*  --  135 132* 132*  K 4.6  --  4.6 4.1  --  4.1 3.7 3.5  CL 94*  --   --  96*  --  101 93*  94*  CO2 24  --   --  23  --  24 26 26   GLUCOSE 121*  --   --  110*  --  99 114* 113*  BUN 23  --   --  19  --  15 10 12   CREATININE 1.47*  --   --  1.29*  --  1.21 0.98 1.08  CALCIUM  9.4  --   --  8.7*  --  8.1* 8.2* 8.2*  AST 17  --   --   --   --   --   --  12*  ALT 10  --   --   --   --   --   --  10  ALKPHOS 70  --   --   --   --   --   --  57  BILITOT 1.0  --   --   --   --   --   --  0.6  ALBUMIN 3.8  --   --   --   --  2.4*  --  2.3*  MG  --   --   --  1.9 2.0  --   --  1.9  PROCALCITON  --   --   --  2.33  --   --   --   --   LATICACIDVEN  --  1.0  --   --   --   --   --   --   INR 1.2  --   --   --   --   --   --   --   AMMONIA 23  --   --   --   --   --   --   --     ------------------------------------------------------------------------------------------------------------------ No results for input(s): CHOL, HDL, LDLCALC, TRIG, CHOLHDL, LDLDIRECT in the last 72 hours.  Lab Results  Component Value Date   HGBA1C 4.8 10/05/2017   ------------------------------------------------------------------------------------------------------------------ No results for input(s): TSH, T4TOTAL, T3FREE, THYROIDAB in  the last 72 hours.  Invalid input(s): FREET3  Cardiac Enzymes No results for input(s): CKMB, TROPONINI, MYOGLOBIN in the last 168 hours.  Invalid input(s): CK ------------------------------------------------------------------------------------------------------------------    Component Value Date/Time   BNP 35.1 12/07/2021 2149    CBG: Recent Labs  Lab 01/28/24 1540 01/31/24 2232  GLUCAP 134* 117*    Recent Results (from the past 240 hours)  Resp panel by RT-PCR (RSV, Flu A&B, Covid) Anterior Nasal Swab     Status: None   Collection Time: 01/28/24  3:56 PM   Specimen: Anterior Nasal Swab  Result Value Ref Range Status   SARS Coronavirus 2 by RT PCR NEGATIVE NEGATIVE Final   Influenza A by PCR NEGATIVE NEGATIVE Final    Influenza B by PCR NEGATIVE NEGATIVE Final    Comment: (NOTE) The Xpert Xpress SARS-CoV-2/FLU/RSV plus assay is intended as an aid in the diagnosis of influenza from Nasopharyngeal swab specimens and should not be used as a sole basis for treatment. Nasal washings and aspirates are unacceptable for Xpert Xpress SARS-CoV-2/FLU/RSV testing.  Fact Sheet for Patients: bloggercourse.com  Fact Sheet for Healthcare Providers: seriousbroker.it  This test is not yet approved or cleared by the [place]  FDA and has been authorized for detection and/or diagnosis of SARS-CoV-2 by FDA under an Emergency Use Authorization (EUA). This EUA will remain in effect (meaning this test can be used) for the duration of the COVID-19 declaration under Section 564(b)(1) of the Act, 21 U.S.C. section 360bbb-3(b)(1), unless the authorization is terminated or revoked.     Resp Syncytial Virus by PCR NEGATIVE NEGATIVE Final    Comment: (NOTE) Fact Sheet for Patients: bloggercourse.com  Fact Sheet for Healthcare Providers: seriousbroker.it  This test is not yet approved or cleared by the [place]  FDA and has been authorized for detection and/or diagnosis of SARS-CoV-2 by FDA under an Emergency Use Authorization (EUA). This EUA will remain in effect (meaning this test can be used) for the duration of the COVID-19 declaration under Section 564(b)(1) of the Act, 21 U.S.C. section 360bbb-3(b)(1), unless the authorization is terminated or revoked.  Performed at [facility], [address], [place], [place] [address]   Culture, blood (Routine x 2)     Status: None   Collection Time: 01/28/24  5:20 PM   Specimen: BLOOD LEFT ARM  Result Value Ref Range Status   Specimen Description BLOOD LEFT ARM  Final   Special Requests   Final    BOTTLES DRAWN AEROBIC AND ANAEROBIC Blood Culture adequate  volume   Culture   Final    NO GROWTH 5 DAYS Performed at [facility], [address], [place], [place] [address]    Report Status 02/02/2024 FINAL  Final  Culture, blood (Routine x 2)     Status: None   Collection Time: 01/28/24  5:52 PM   Specimen: BLOOD LEFT ARM  Result Value Ref Range Status   Specimen Description BLOOD LEFT ARM  Final   Special Requests   Final    BOTTLES DRAWN AEROBIC ONLY Blood Culture results may not be optimal due to an inadequate volume of blood received in culture bottles   Culture   Final    NO GROWTH 5 DAYS Performed at [facility], [address], [place], [place] [address]    Report Status 02/02/2024 FINAL  Final  Respiratory (~20 pathogens) panel by PCR     Status: None   Collection Time: 01/28/24  8:17 PM  Specimen: Nasopharyngeal Swab; Respiratory  Result Value Ref Range Status   Adenovirus NOT DETECTED NOT DETECTED Final   Coronavirus 229E NOT DETECTED NOT DETECTED Final    Comment: (NOTE) The Coronavirus on the Respiratory Panel, DOES NOT test for the novel  Coronavirus (2019 nCoV)    Coronavirus HKU1 NOT DETECTED NOT DETECTED Final   Coronavirus NL63 NOT DETECTED NOT DETECTED Final   Coronavirus OC43 NOT DETECTED NOT DETECTED Final   Metapneumovirus NOT DETECTED NOT DETECTED Final   Rhinovirus / Enterovirus NOT DETECTED NOT DETECTED Final   Influenza A NOT DETECTED NOT DETECTED Final   Influenza B NOT DETECTED NOT DETECTED Final   Parainfluenza Virus 1 NOT DETECTED NOT DETECTED Final   Parainfluenza Virus 2 NOT DETECTED NOT DETECTED Final   Parainfluenza Virus 3 NOT DETECTED NOT DETECTED Final   Parainfluenza Virus 4 NOT DETECTED NOT DETECTED Final   Respiratory Syncytial Virus NOT DETECTED NOT DETECTED Final   Bordetella pertussis NOT DETECTED NOT DETECTED Final   Bordetella Parapertussis NOT DETECTED NOT DETECTED Final   Chlamydophila pneumoniae NOT DETECTED NOT DETECTED Final   Mycoplasma pneumoniae NOT DETECTED NOT  DETECTED Final    Comment: Performed at [facility], [address] [place], [place] [address]  Urine Culture (for pregnant, neutropenic or urologic patients or patients with an indwelling urinary catheter)     Status: None   Collection Time: 01/28/24 10:10 PM   Specimen: Urine, Clean Catch  Result Value Ref Range Status   Specimen Description URINE, CLEAN CATCH  Final   Special Requests NONE  Final   Culture   Final    NO GROWTH Performed at [facility], [address], [place], [place] [address]    Report Status 01/30/2024 FINAL  Final  MRSA Next Gen by PCR, Nasal     Status: Abnormal   Collection Time: 01/31/24  8:12 AM   Specimen: Nasal Mucosa; Nasal Swab  Result Value Ref Range Status   MRSA by PCR Next Gen DETECTED (A) NOT DETECTED Final    Comment: RESULT CALLED TO, READ BACK BY AND VERIFIED WITH: RN J.NARAMDAS AT 1254 ON 01/31/2024 BY T.SAAD. (NOTE) The GeneXpert MRSA Assay (FDA approved for NASAL specimens only), is one component of a comprehensive MRSA colonization surveillance program. It is not intended to diagnose MRSA infection nor to guide or monitor treatment for MRSA infections. Test performance is not FDA approved in patients less than 57 years old. Performed at [facility], [address], [place], [place] [address]     Radiology Studies: No results found.   CRITICAL CARE Performed by: Nilda Fendt   Total critical care time: 40 minutes  Critical care time was exclusive of separately billable procedures and treating other patients.  Critical care was necessary to treat or prevent imminent or life-threatening deterioration.  Critical care was time spent personally by me on the following activities: bedside evaluation of new onset A fib with RVR with rates up to 200, evaluate hemodynamic stability, attempted BB with no significant the rate, transition to Cardizem  continuous IV infusion, development of treatment plan with patient  and/or surrogate as well as nursing, discussions with cardiology consultants, evaluation of patient's response to treatment, examination of patient, obtaining history from patient or surrogate, ordering and performing treatments and interventions, ordering and review of laboratory studies, ordering and review of radiographic studies, pulse oximetry and re-evaluation of patient's condition.   Nilda Fendt, MD, PhD [facility]  Between 7  am - 7 pm I am available, please contact me via Amion (for emergencies) or Securechat (non urgent messages)  Between 7 pm - 7 am I am not available, please contact night coverage MD/APP via Amion

## 2024-02-04 NOTE — Plan of Care (Signed)

## 2024-02-04 NOTE — Progress Notes (Signed)
 Rounding Note   Patient Name: Logan Phillips Date of Encounter: 02/04/2024  [facility] Cardiologist: Redell Shallow, MD   Subjective No change in symptoms; left side CP with inspiration; mild dyspnea  Scheduled Meds:  allopurinol   100 mg Oral Daily   amoxicillin -clavulanate  1 tablet Oral Q12H   Chlorhexidine  Gluconate Cloth  6 each Topical Daily   enoxaparin  (LOVENOX ) injection  40 mg Subcutaneous Daily   feeding supplement  237 mL Oral BID BM   folic acid   1 mg Oral Daily   guaiFENesin   600 mg Oral BID   lidocaine   1 patch Transdermal Q24H   melatonin  3 mg Oral QHS   metoprolol  tartrate  100 mg Oral BID   multivitamin with minerals  1 tablet Oral Daily   mupirocin  ointment  1 Application Nasal BID   phosphorus  250 mg Oral TID   predniSONE   40 mg Oral Q breakfast   Followed by   NOREEN ON 02/06/2024] predniSONE   30 mg Oral Q breakfast   Followed by   NOREEN ON 02/09/2024] predniSONE   20 mg Oral Q breakfast   Followed by   NOREEN ON 02/12/2024] predniSONE   10 mg Oral Q breakfast   rosuvastatin   10 mg Oral Daily   thiamine   100 mg Oral Daily   Or   thiamine   100 mg Intravenous Daily   Continuous Infusions:   PRN Meds: acetaminophen  **OR** acetaminophen , albuterol , menthol , ondansetron  **OR** ondansetron  (ZOFRAN ) IV, mouth rinse, oxyCODONE , polyethylene glycol   Vital Signs  Vitals:   02/03/24 1651 02/03/24 1922 02/04/24 0100 02/04/24 0400  BP: (!) 145/85 (!) 161/92 (!) 168/82 (!) 144/78  Pulse: 80 100 74 74  Resp: 17 20 18 19   Temp: 98.2 F (36.8 C) 99.2 F (37.3 C) 98.3 F (36.8 C) 98.9 F (37.2 C)  TempSrc: Oral Oral Oral Oral  SpO2: 95% 98% 95% 92%  Weight:      Height:        Intake/Output Summary (Last 24 hours) at 02/04/2024 0717 Last data filed at 02/03/2024 2200 Gross per 24 hour  Intake 340 ml  Output 850 ml  Net -510 ml      02/01/2024   11:09 AM 01/29/2024    1:13 PM 01/28/2024    3:36 PM  Last 3 Weights  Weight (lbs)  142 lb 13.7 oz 137 lb 9.1 oz 150 lb  Weight (kg) 64.8 kg 62.4 kg 68.04 kg      Telemetry Sinus with short run of SVT, 3 beats nonsustained ventricular tachycardia, PVCs- Personally Reviewed   Physical Exam  GEN: NAD , well-developed Neck: supple, no JVD Cardiac: RRR, no rub Respiratory: diminished BS bases GI: Soft, NT/ND, no masses MS: No edema Neuro: No focal findings Psych: Normal affect   Labs  Chemistry Recent Labs  Lab 01/28/24 1723 01/28/24 1747 01/29/24 0158 01/30/24 0425 01/30/24 0426 02/01/24 0401 02/02/24 0242  NA 130*   < > 131*  --  135 132* 132*  K 4.6   < > 4.1  --  4.1 3.7 3.5  CL 94*  --  96*  --  101 93* 94*  CO2 24  --  23  --  24 26 26   GLUCOSE 121*  --  110*  --  99 114* 113*  BUN 23  --  19  --  15 10 12   CREATININE 1.47*  --  1.29*  --  1.21 0.98 1.08  CALCIUM  9.4  --  8.7*  --  8.1* 8.2* 8.2*  MG  --   --  1.9 2.0  --   --  1.9  PROT 7.9  --   --   --   --   --  6.2*  ALBUMIN 3.8  --   --   --  2.4*  --  2.3*  AST 17  --   --   --   --   --  12*  ALT 10  --   --   --   --   --  10  ALKPHOS 70  --   --   --   --   --  57  BILITOT 1.0  --   --   --   --   --  0.6  GFRNONAA 50*  --  59*  --  >60 >60 >60  ANIONGAP 12  --  12  --  10 13 12    < > = values in this interval not displayed.    Hematology Recent Labs  Lab 01/30/24 0425 02/01/24 0401 02/01/24 1118 02/02/24 0242  WBC 9.5 9.9  --  11.2*  RBC 3.62* 4.30 4.80 4.41  HGB 9.5* 11.1*  --  11.3*  HCT 28.9* 33.8*  --  34.5*  MCV 79.8* 78.6*  --  78.2*  MCH 26.2 25.8*  --  25.6*  MCHC 32.9 32.8  --  32.8  RDW 13.9 13.9  --  13.9  PLT 163 220  --  249     Patient Profile   73 y.o. male with a hx of hypertension, hyperlipidemia, gout admitted with confusion and found to have sepsis/pneumonia who is being seen 02/01/2024 for the evaluation of atrial fibrillation/atrial flutter.  Echocardiogram shows normal LV function and grade 1 diastolic dysfunction.  Assessment & Plan   Paroxysmal atrial fibrillation/flutter-patient remains in sinus rhythm on telemetry with short run of SVT but no recurrent atrial fibrillation.  Hyperadrenergic state associated with pneumonia may be contributing to atrial arrhythmias.  LV function is normal.  Continue metoprolol  100 mg twice daily.  Can add low-dose Cardizem  if she develops recurrent arrhythmias on metoprolol .  Continue to follow on telemetry.  CHA2DS2-VASc is 2 for age greater than 34 and hypertension.  However as outlined previously issue of anticoagulation is somewhat complicated.  He is noted to have mild microcytic anemia and has had occasional hematochezia in the past though none recently by his report.  He also has a history of alcohol use and a small intracerebral aneurysm.  For now we will not anticoagulate and follow closely on telemetry.  If he has more frequent episodes of atrial fibrillation then would need to reassess.  Would then plan outpatient monitor to evaluate burden. Lipoid pneumonia/sepsis-patient is on antibiotics for potential superimposed infection. Hypertension-blood pressure mildly elevated.  Continue to follow.  Add Cardizem  if needed. History of alcohol use-no evidence of withdrawal Metabolic encephalopathy-resolved History of intracerebral aneurysm-follow-up neurosurgery as an outpatient. For questions or updates, please contact [facility] Please consult [email or web address] for contact info under     Signed, Redell Shallow, MD  02/04/2024, 7:17 AM

## 2024-02-05 DIAGNOSIS — A419 Sepsis, unspecified organism: Secondary | ICD-10-CM | POA: Diagnosis not present

## 2024-02-05 DIAGNOSIS — R6520 Severe sepsis without septic shock: Secondary | ICD-10-CM | POA: Diagnosis not present

## 2024-02-05 DIAGNOSIS — I480 Paroxysmal atrial fibrillation: Secondary | ICD-10-CM | POA: Diagnosis not present

## 2024-02-05 LAB — BASIC METABOLIC PANEL WITH GFR
Anion gap: 13 (ref 5–15)
BUN: 15 mg/dL (ref 8–23)
CO2: 27 mmol/L (ref 22–32)
Calcium: 8.3 mg/dL — ABNORMAL LOW (ref 8.9–10.3)
Chloride: 94 mmol/L — ABNORMAL LOW (ref 98–111)
Creatinine, Ser: 0.81 mg/dL (ref 0.61–1.24)
GFR, Estimated: >60 mL/min (ref >60)
Glucose, Bld: 109 mg/dL — ABNORMAL HIGH (ref 70–99)
Potassium: 4 mmol/L (ref 3.5–5.1)
Sodium: 134 mmol/L — ABNORMAL LOW (ref 135–145)

## 2024-02-05 LAB — CBC
HCT: 32.6 % — ABNORMAL LOW (ref 39.0–52.0)
Hemoglobin: 10.5 g/dL — ABNORMAL LOW (ref 13.0–17.0)
MCH: 25.2 pg — ABNORMAL LOW (ref 26.0–34.0)
MCHC: 32.2 g/dL (ref 30.0–36.0)
MCV: 78.4 fL — ABNORMAL LOW (ref 80.0–100.0)
Platelets: 397 K/uL (ref 150–400)
RBC: 4.16 MIL/uL — ABNORMAL LOW (ref 4.22–5.81)
RDW: 14.1 % (ref 11.5–15.5)
WBC: 13.1 K/uL — ABNORMAL HIGH (ref 4.0–10.5)
nRBC: 0 % (ref 0.0–0.2)

## 2024-02-05 LAB — MAGNESIUM: Magnesium: 2.2 mg/dL (ref 1.7–2.4)

## 2024-02-05 MED ORDER — DILTIAZEM HCL ER COATED BEADS 120 MG PO CP24: 120.0000 mg | ORAL_CAPSULE | Freq: Every day | ORAL | 0 refills | Status: AC

## 2024-02-05 MED ORDER — LIDOCAINE 5 % EX PTCH: 2.0000 | MEDICATED_PATCH | CUTANEOUS | 0 refills | Status: AC

## 2024-02-05 MED ORDER — DILTIAZEM HCL ER COATED BEADS 120 MG PO CP24
120.0000 mg | ORAL_CAPSULE | Freq: Every day | ORAL | Status: DC
  Administered 2024-02-05: 120 mg via ORAL
  Filled 2024-02-05: qty 1

## 2024-02-05 MED ORDER — AMOXICILLIN-POT CLAVULANATE 875-125 MG PO TABS: 1.0000 | ORAL_TABLET | Freq: Two times a day (BID) | ORAL | 0 refills | Status: AC

## 2024-02-05 MED ORDER — POLYETHYLENE GLYCOL 3350 17 G PO PACK: 17.0000 g | PACK | Freq: Every day | ORAL | 0 refills | Status: AC

## 2024-02-05 MED ORDER — PREDNISONE 10 MG PO TABS: ORAL_TABLET | ORAL | 0 refills | Status: AC

## 2024-02-05 MED ORDER — GUAIFENESIN 100 MG/5ML PO LIQD: 200.0000 mg | Freq: Four times a day (QID) | ORAL | 0 refills | Status: AC | PRN

## 2024-02-05 MED ORDER — METOPROLOL TARTRATE 100 MG PO TABS: 100.0000 mg | ORAL_TABLET | Freq: Two times a day (BID) | ORAL | 0 refills | Status: AC

## 2024-02-05 MED ORDER — SENNOSIDES-DOCUSATE SODIUM 8.6-50 MG PO TABS: 2.0000 | ORAL_TABLET | Freq: Two times a day (BID) | ORAL | 0 refills | Status: AC | PRN

## 2024-02-05 NOTE — Plan of Care (Signed)
 Problem: Activity: Goal: Ability to tolerate increased activity will improve Outcome: Progressing   Problem: Clinical Measurements: Goal: Ability to maintain a body temperature in the normal range will improve Outcome: Progressing   Problem: Respiratory: Goal: Ability to maintain adequate ventilation will improve Outcome: Progressing Goal: Ability to maintain a clear airway will improve Outcome: Progressing   Problem: Education: Goal: Knowledge of General Education information will improve Description: Including pain rating scale, medication(s)/side effects and non-pharmacologic comfort measures Outcome: Progressing   Problem: Health Behavior/Discharge Planning: Goal: Ability to manage health-related needs will improve Outcome: Progressing   Problem: Clinical Measurements: Goal: Ability to maintain clinical measurements within normal limits will improve Outcome: Progressing Goal: Will remain free from infection Outcome: Progressing Goal: Diagnostic test results will improve Outcome: Progressing Goal: Respiratory complications will improve Outcome: Progressing Goal: Cardiovascular complication will be avoided Outcome: Progressing   Problem: Activity: Goal: Risk for activity intolerance will decrease Outcome: Progressing   Problem: Nutrition: Goal: Adequate nutrition will be maintained Outcome: Progressing   Problem: Coping: Goal: Level of anxiety will decrease Outcome: Progressing   Problem: Elimination: Goal: Will not experience complications related to bowel motility Outcome: Progressing Goal: Will not experience complications related to urinary retention Outcome: Progressing   Problem: Pain Managment: Goal: General experience of comfort will improve and/or be controlled Outcome: Progressing   Problem: Safety: Goal: Ability to remain free from injury will improve Outcome: Progressing   Problem: Skin Integrity: Goal: Risk for impaired skin integrity  will decrease Outcome: Progressing   Problem: Activity: Goal: Ability to tolerate increased activity will improve Outcome: Progressing   Problem: Clinical Measurements: Goal: Ability to maintain a body temperature in the normal range will improve Outcome: Progressing   Problem: Respiratory: Goal: Ability to maintain adequate ventilation will improve Outcome: Progressing Goal: Ability to maintain a clear airway will improve Outcome: Progressing   Problem: Education: Goal: Knowledge of General Education information will improve Description: Including pain rating scale, medication(s)/side effects and non-pharmacologic comfort measures Outcome: Progressing   Problem: Health Behavior/Discharge Planning: Goal: Ability to manage health-related needs will improve Outcome: Progressing   Problem: Clinical Measurements: Goal: Ability to maintain clinical measurements within normal limits will improve Outcome: Progressing Goal: Will remain free from infection Outcome: Progressing Goal: Diagnostic test results will improve Outcome: Progressing Goal: Respiratory complications will improve Outcome: Progressing Goal: Cardiovascular complication will be avoided Outcome: Progressing   Problem: Activity: Goal: Risk for activity intolerance will decrease Outcome: Progressing   Problem: Nutrition: Goal: Adequate nutrition will be maintained Outcome: Progressing   Problem: Coping: Goal: Level of anxiety will decrease Outcome: Progressing   Problem: Elimination: Goal: Will not experience complications related to bowel motility Outcome: Progressing Goal: Will not experience complications related to urinary retention Outcome: Progressing   Problem: Pain Managment: Goal: General experience of comfort will improve and/or be controlled Outcome: Progressing   Problem: Safety: Goal: Ability to remain free from injury will improve Outcome: Progressing   Problem: Skin  Integrity: Goal: Risk for impaired skin integrity will decrease Outcome: Progressing   Problem: Activity: Goal: Ability to tolerate increased activity will improve Outcome: Progressing   Problem: Clinical Measurements: Goal: Ability to maintain a body temperature in the normal range will improve Outcome: Progressing   Problem: Respiratory: Goal: Ability to maintain adequate ventilation will improve Outcome: Progressing Goal: Ability to maintain a clear airway will improve Outcome: Progressing   Problem: Education: Goal: Knowledge of General Education information will improve Description: Including pain rating scale, medication(s)/side effects and non-pharmacologic comfort measures  Outcome: Progressing   Problem: Health Behavior/Discharge Planning: Goal: Ability to manage health-related needs will improve Outcome: Progressing   Problem: Clinical Measurements: Goal: Ability to maintain clinical measurements within normal limits will improve Outcome: Progressing Goal: Will remain free from infection Outcome: Progressing Goal: Diagnostic test results will improve Outcome: Progressing Goal: Respiratory complications will improve Outcome: Progressing Goal: Cardiovascular complication will be avoided Outcome: Progressing   Problem: Activity: Goal: Risk for activity intolerance will decrease Outcome: Progressing   Problem: Nutrition: Goal: Adequate nutrition will be maintained Outcome: Progressing   Problem: Coping: Goal: Level of anxiety will decrease Outcome: Progressing   Problem: Elimination: Goal: Will not experience complications related to bowel motility Outcome: Progressing Goal: Will not experience complications related to urinary retention Outcome: Progressing   Problem: Pain Managment: Goal: General experience of comfort will improve and/or be controlled Outcome: Progressing   Problem: Safety: Goal: Ability to remain free from injury will  improve Outcome: Progressing   Problem: Skin Integrity: Goal: Risk for impaired skin integrity will decrease Outcome: Progressing   Problem: Activity: Goal: Ability to tolerate increased activity will improve Outcome: Progressing   Problem: Clinical Measurements: Goal: Ability to maintain a body temperature in the normal range will improve Outcome: Progressing   Problem: Respiratory: Goal: Ability to maintain adequate ventilation will improve Outcome: Progressing Goal: Ability to maintain a clear airway will improve Outcome: Progressing   Problem: Education: Goal: Knowledge of General Education information will improve Description: Including pain rating scale, medication(s)/side effects and non-pharmacologic comfort measures Outcome: Progressing   Problem: Health Behavior/Discharge Planning: Goal: Ability to manage health-related needs will improve Outcome: Progressing   Problem: Clinical Measurements: Goal: Ability to maintain clinical measurements within normal limits will improve Outcome: Progressing Goal: Will remain free from infection Outcome: Progressing Goal: Diagnostic test results will improve Outcome: Progressing Goal: Respiratory complications will improve Outcome: Progressing Goal: Cardiovascular complication will be avoided Outcome: Progressing   Problem: Activity: Goal: Risk for activity intolerance will decrease Outcome: Progressing   Problem: Nutrition: Goal: Adequate nutrition will be maintained Outcome: Progressing   Problem: Coping: Goal: Level of anxiety will decrease Outcome: Progressing   Problem: Elimination: Goal: Will not experience complications related to bowel motility Outcome: Progressing Goal: Will not experience complications related to urinary retention Outcome: Progressing   Problem: Pain Managment: Goal: General experience of comfort will improve and/or be controlled Outcome: Progressing   Problem: Safety: Goal:  Ability to remain free from injury will improve Outcome: Progressing   Problem: Skin Integrity: Goal: Risk for impaired skin integrity will decrease Outcome: Progressing   Problem: Activity: Goal: Ability to tolerate increased activity will improve Outcome: Progressing   Problem: Clinical Measurements: Goal: Ability to maintain a body temperature in the normal range will improve Outcome: Progressing   Problem: Respiratory: Goal: Ability to maintain adequate ventilation will improve Outcome: Progressing Goal: Ability to maintain a clear airway will improve Outcome: Progressing   Problem: Education: Goal: Knowledge of General Education information will improve Description: Including pain rating scale, medication(s)/side effects and non-pharmacologic comfort measures Outcome: Progressing   Problem: Health Behavior/Discharge Planning: Goal: Ability to manage health-related needs will improve Outcome: Progressing   Problem: Clinical Measurements: Goal: Ability to maintain clinical measurements within normal limits will improve Outcome: Progressing Goal: Will remain free from infection Outcome: Progressing Goal: Diagnostic test results will improve Outcome: Progressing Goal: Respiratory complications will improve Outcome: Progressing Goal: Cardiovascular complication will be avoided Outcome: Progressing   Problem: Activity: Goal: Risk for activity intolerance will decrease Outcome: Progressing  Problem: Nutrition: Goal: Adequate nutrition will be maintained Outcome: Progressing   Problem: Coping: Goal: Level of anxiety will decrease Outcome: Progressing   Problem: Elimination: Goal: Will not experience complications related to bowel motility Outcome: Progressing Goal: Will not experience complications related to urinary retention Outcome: Progressing   Problem: Pain Managment: Goal: General experience of comfort will improve and/or be controlled Outcome:  Progressing   Problem: Safety: Goal: Ability to remain free from injury will improve Outcome: Progressing   Problem: Skin Integrity: Goal: Risk for impaired skin integrity will decrease Outcome: Progressing

## 2024-02-05 NOTE — Progress Notes (Signed)
 Mobility Specialist Progress Note:    02/05/24 1027  Mobility  Activity Ambulated independently  Level of Assistance Independent after set-up  Assistive Device None  Distance Ambulated (ft) 100 ft  Range of Motion/Exercises Active  Activity Response Tolerated well  Mobility Referral Yes  Mobility visit 1 Mobility  Mobility Specialist Start Time (ACUTE ONLY) 1027  Mobility Specialist Stop Time (ACUTE ONLY) 1032  Mobility Specialist Time Calculation (min) (ACUTE ONLY) 5 min   Received pt ambulating in room agreeable to short session prior to d/c. No c/o any symptoms. Pt moving and ambulating well. Returned pt to room w/ all needs met.   Venetia Keel Mobility Specialist Please Neurosurgeon or Rehab Office at [phone]

## 2024-02-05 NOTE — Progress Notes (Addendum)
 Rounding Note   Patient Name: Logan Phillips Date of Encounter: 02/05/2024  [facility] Cardiologist: Redell Shallow, MD   Subjective CP with cough; no dyspnea  Scheduled Meds:  allopurinol   100 mg Oral Daily   amoxicillin -clavulanate  1 tablet Oral Q12H   enoxaparin  (LOVENOX ) injection  40 mg Subcutaneous Daily   feeding supplement  237 mL Oral BID BM   folic acid   1 mg Oral Daily   guaiFENesin   200 mg Oral Q4H   lidocaine   2 patch Transdermal Q24H   melatonin  3 mg Oral QHS   metoprolol  tartrate  100 mg Oral BID   multivitamin with minerals  1 tablet Oral Daily   phosphorus  250 mg Oral TID   predniSONE   40 mg Oral Q breakfast   Followed by   NOREEN ON 02/06/2024] predniSONE   30 mg Oral Q breakfast   Followed by   NOREEN ON 02/09/2024] predniSONE   20 mg Oral Q breakfast   Followed by   NOREEN ON 02/12/2024] predniSONE   10 mg Oral Q breakfast   rosuvastatin   10 mg Oral Daily   senna-docusate  2 tablet Oral BID   thiamine   100 mg Oral Daily   Or   thiamine   100 mg Intravenous Daily   Continuous Infusions:   PRN Meds: acetaminophen  **OR** acetaminophen , albuterol , menthol , ondansetron  **OR** ondansetron  (ZOFRAN ) IV, mouth rinse, oxyCODONE , polyethylene glycol   Vital Signs  Vitals:   02/04/24 1734 02/04/24 2041 02/05/24 0030 02/05/24 0350  BP: (!) 152/86 139/83 (!) 146/82 (!) 145/88  Pulse: 75 79 64 71  Resp: 20 14 19 14   Temp: 98.5 F (36.9 C) 100 F (37.8 C) 98.1 F (36.7 C) (!) 97.5 F (36.4 C)  TempSrc: Oral Oral Oral Oral  SpO2: 98% 93% 96% 96%  Weight:      Height:       No intake or output data in the 24 hours ending 02/05/24 0800     02/01/2024   11:09 AM 01/29/2024    1:13 PM 01/28/2024    3:36 PM  Last 3 Weights  Weight (lbs) 142 lb 13.7 oz 137 lb 9.1 oz 150 lb  Weight (kg) 64.8 kg 62.4 kg 68.04 kg      Telemetry Sinus with PVCs- Personally Reviewed   Physical Exam  GEN: NAD , WD Neck: supple Cardiac:  RRR Respiratory: diminished BS bases GI: Soft, NT/ND MS: No edema Neuro: Grossly intact Psych: Normal affect   Labs  Chemistry Recent Labs  Lab 01/30/24 0425 01/30/24 0426 01/30/24 0426 02/01/24 0401 02/02/24 0242 02/05/24 0113  NA  --  135   < > 132* 132* 134*  K  --  4.1   < > 3.7 3.5 4.0  CL  --  101   < > 93* 94* 94*  CO2  --  24   < > 26 26 27   GLUCOSE  --  99   < > 114* 113* 109*  BUN  --  15   < > 10 12 15   CREATININE  --  1.21   < > 0.98 1.08 0.81  CALCIUM   --  8.1*   < > 8.2* 8.2* 8.3*  MG 2.0  --   --   --  1.9 2.2  PROT  --   --   --   --  6.2*  --   ALBUMIN  --  2.4*  --   --  2.3*  --   AST  --   --   --   --  12*  --   ALT  --   --   --   --  10  --   ALKPHOS  --   --   --   --  57  --   BILITOT  --   --   --   --  0.6  --   GFRNONAA  --  >60   < > >60 >60 >60  ANIONGAP  --  10   < > 13 12 13    < > = values in this interval not displayed.    Hematology Recent Labs  Lab 02/01/24 0401 02/01/24 1118 02/02/24 0242 02/05/24 0113  WBC 9.9  --  11.2* 13.1*  RBC 4.30 4.80 4.41 4.16*  HGB 11.1*  --  11.3* 10.5*  HCT 33.8*  --  34.5* 32.6*  MCV 78.6*  --  78.2* 78.4*  MCH 25.8*  --  25.6* 25.2*  MCHC 32.8  --  32.8 32.2  RDW 13.9  --  13.9 14.1  PLT 220  --  249 397     Patient Profile   73 y.o. male with a hx of hypertension, hyperlipidemia, gout admitted with confusion and found to have sepsis/pneumonia who is being seen 02/01/2024 for the evaluation of atrial fibrillation/atrial flutter.  Echocardiogram shows normal LV function and grade 1 diastolic dysfunction.  Assessment & Plan  Paroxysmal atrial fibrillation/flutter-patient remains in sinus rhythm on telemetry with no recurrent atrial fibrillation.  Hyperadrenergic state associated with pneumonia may have contributed to atrial arrhythmias.  LV function is normal.  Continue metoprolol  100 mg twice daily.  Blood pressure running high.  Will add Cardizem  CD 120 mg daily.  CHA2DS2-VASc is 2 for age  greater than 72 and hypertension.  However as outlined previously issue of anticoagulation is somewhat complicated.  He is noted to have mild microcytic anemia and has had occasional hematochezia in the past though none recently by his report.  He also has a history of alcohol use and a small intracerebral aneurysm.  For now we will not anticoagulate and follow closely on telemetry.  If he has more frequent episodes of atrial fibrillation then would need to reassess.  Would then plan outpatient monitor to evaluate burden. Lipoid pneumonia/sepsis-patient is on antibiotics for potential superimposed infection. Hypertension-blood pressure mildly elevated.  Add Cardizem  CD 120 mg daily.  Adjust as an outpatient. History of alcohol use-no evidence of withdrawal Metabolic encephalopathy-resolved History of intracerebral aneurysm-follow-up neurosurgery as an outpatient. For questions or updates, please contact [facility] Please consult [email or web address] for contact info under   Patient can be discharged from a cardiac standpoint.  We will arrange follow-up in the office with APP 2 to 4 weeks.  Plan 30-day monitor as an outpatient as described.  Follow-up with me in 3 to 6 months.  Signed, Redell Shallow, MD  02/05/2024, 8:00 AM

## 2024-02-05 NOTE — Progress Notes (Signed)
 Discharge plan revised with the patient.  Revised medications that need to be picked up from pharmacy.  Family/friend here to meet pt, who will be transported by wheelchair to an awaiting vehicle.

## 2024-02-05 NOTE — Discharge Summary (Signed)
 Physician Discharge Summary  Logan Phillips FMW:[id number] DOB: May 10, 1950 DOA: 01/28/2024  PCP: Bulah Alm RAMAN, PA-C  Admit date: 01/28/2024 Discharge date: 02/05/2024  Admitted From: home Disposition:  home  Recommendations for Outpatient Follow-up:  Follow up with PCP in 1-2 weeks Outpatient follow-up with pulmonary  Home Health: none Equipment/Devices: none  Discharge Condition: stable CODE STATUS: Full code Diet Orders (From admission, onward)     Start     Ordered   01/31/24 1515  Diet regular Room service appropriate? Yes; Fluid consistency: Thin  Diet effective now       Question Answer Comment  Room service appropriate? Yes   Fluid consistency: Thin      01/31/24 1514            Brief Narrative / Interim history: 73 year old male with history of lipoid/necrotizing pneumonia, HTN, gout, EtOH use who presented to the hospital with confusion.  He apparently was playing golf with his friends and suddenly became confused.  He was found to be febrile to 102.4 on admission, tachycardic, had a leukocytosis and imaging was concerning for worsening pneumonia.  He was placed on antibiotics and admitted to the hospital.  COVID, influenza, RSV all negative.  Hospital course complicated by new onset A-fib with RVR 11/19  Hospital Course / Discharge diagnoses: Principal problem Severe sepsis -patient presented to the hospital with high-grade fever, tachycardia, tachypnea, elevated white count.  CT scan of the chest showed known history of lipoid pneumonia, slightly progressed since prior study.  Suspect superimposed infection, started on antibiotics.  Discussed with pulmonology over the phone 11/21, plan to do longer course of antibiotics, has been switched to Augmentin  and will do 6 additional days on discharge for total of 2 weeks.  Pulmonology also recommended steroids, received to hospitalist, will continue taper.  Sepsis physiology has resolved, he is back to baseline and  will be discharged home in stable condition. COVID, influenza, RSV all negative  Active problems Paroxysmal A-fib / flutter with RVR -new onset 11/19, was a brief episode, cardiology consulted and followed patient while hospitalized.  He self converted to sinus rhythm, has been placed on diltiazem  in addition to his home metoprolol , tolerating them well.  A 2D echocardiogram showed LVEF 60-65%, concentric LVH and grade 1 diastolic dysfunction.  RV was normal.  He will have follow-up with cardiology as an outpatient.  Due to his history of intracerebral aneurysm, prior craniotomy for surgery, brief episode of A-fib without recurrence, anticoagulation was deferred for now History of recurrent lipoid pneumonia - History of recurrent  pneumonia pneumonia. Follows with Dr Meade. Productive cough over past 1 year no dyspnea.  Works as a administrator, public affairs consultant, has also been sipping on mineral oil for his chronic constipation.  Never smoked.  Currently on room air.  Speech therapy evaluated the patient and recommended regular diet.  Will need pulm follow-up as an outpatient.  Counseled regarding avoiding exposure to oil containing agents  Constipation-continue MiraLAX , add Senokot today Hyponatremia -sodium overall stable, no large shifts noted Acute metabolic encephalopathy - In the setting of severe sepsis. 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  Case was discussed with neurology.  MRI did not show any acute intracranial abnormalities.  This is resolved, he is back to baseline, alert and orient x 4  Alcohol use - Drinks 1-2 beers or vodka about 4 times a week.  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  Case discussed with neurology.  Recommend outpatient follow-up with neurosurgery.  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 Hypertension -blood  pressure is stable and acceptable Hyponatremia-no large shifts, monitor Gout - Continue allopurinol  Hypophosphatemia -continue to monitor and supplement as indicated Poor oral intake/malnutrition - Mentioned that he has been eating poorly and losing weight since last few months.  Dietitian was consulted  Sepsis ruled out   Discharge Instructions   Allergies as of 02/05/2024   No Known Allergies      Medication List     STOP taking these medications    amphetamine -dextroamphetamine  10 MG tablet Commonly known as: Adderall   docusate sodium  100 MG capsule Commonly known as: Colace   lisinopril  2.5 MG tablet Commonly known as: ZESTRIL        TAKE these medications    acetaminophen  500 MG tablet Commonly known as: TYLENOL  Take 1,000 mg by mouth every 6 (six) hours as needed.   albuterol  (2.5 MG/3ML) 0.083% nebulizer solution Commonly known as: PROVENTIL  Take 3 mLs (2.5 mg total) by nebulization every 6 (six) hours as needed for wheezing or shortness of breath.   albuterol  108 (90 Base) MCG/ACT inhaler Commonly known as: VENTOLIN  HFA Inhale 2 puffs into the lungs every 6 (six) hours as needed for wheezing or shortness of breath.   allopurinol  100 MG tablet Commonly known as: ZYLOPRIM  TAKE 1 TABLET BY MOUTH EVERY DAY   amoxicillin -clavulanate 875-125 MG tablet Commonly known as: AUGMENTIN  Take 1 tablet by mouth every 12 (twelve) hours for 6 days.   diltiazem  120 MG 24 hr capsule Commonly known as: CARDIZEM  CD Take 1 capsule (120 mg total) by mouth daily. Start taking on: February 06, 2024   guaiFENesin  100 MG/5ML liquid Commonly known as: ROBITUSSIN Take 10 mLs (200 mg total) by mouth every 6 (six) hours as needed for cough or to loosen phlegm.   lidocaine  5 % Commonly known as: LIDODERM  Place 2 patches onto the skin daily. Remove & Discard patch within 12 hours or as directed by MD Start taking on: February 06, 2024   meloxicam  15 MG tablet Commonly  known as: MOBIC  TAKE 1 TABLET(15 MG) BY MOUTH DAILY   metoprolol  tartrate 100 MG tablet Commonly known as: LOPRESSOR  Take 1 tablet (100 mg total) by mouth 2 (two) times daily. What changed:  how to take this when to take this   multivitamin with minerals Tabs tablet Take 1 tablet by mouth daily.   Oxycodone  HCl 10 MG Tabs Take 10 mg by mouth 5 (five) times daily.   polyethylene glycol 17 g packet Commonly known as: MIRALAX  / GLYCOLAX  Take 17 g by mouth daily.   predniSONE  10 MG tablet Commonly known as: DELTASONE  Take 3 tablets (30 mg total) by mouth daily for 3 days, THEN 2 tablets (20 mg total) daily for 3 days, THEN 1 tablet (10 mg total) daily for 3 days. Start taking on: February 05, 2024   rosuvastatin  10 MG tablet Commonly known as: CRESTOR  Take 1 tablet (10 mg total) by mouth daily.   senna-docusate 8.6-50 MG tablet Commonly known as: Senokot-S Take 2 tablets by mouth 2 (two) times daily as needed for moderate constipation or mild constipation.   Testosterone  25 MG/2.5GM (1%) Gel Apply 2 Pump topically daily.        Follow-up Information     [facility] Follow up.  Specialty: Rehabilitation Why: patient wants to call and set this up himself , he is not ready to do this just yet , will provide him the address and phone number. Contact information: [address] [place]  [phone]                Consultations: Cardiology   Procedures/Studies:  ECHOCARDIOGRAM COMPLETE Result Date: 02/01/2024    ECHOCARDIOGRAM REPORT   Patient Name:   Johsua DELENA FAITH Date of Exam: 02/01/2024 Medical Rec #:  [id number]      Height:       67.0 in Accession #:    [id number]     Weight:       137.6 lb Date of Birth:  08/15/1950      BSA:          1.725 m Patient Age:    73 years       BP:           127/81 mmHg Patient Gender: M              HR:           70 bpm. Exam Location:   Inpatient Procedure: 2D Echo, 3D Echo, Cardiac Doppler and Color Doppler (Both Spectral            and Color Flow Doppler were utilized during procedure). Indications:    Atrial fibrillation  History:        Patient has prior history of Echocardiogram examinations, most                 recent 10/01/2022. CAD; Risk Factors:Hypertension and                 Dyslipidemia.  Sonographer:    Philomena Daring Referring Phys: [id number] NILDA HERO Chrishonda Hesch IMPRESSIONS  1. Left ventricular ejection fraction, by estimation, is 60 to 65%. Left ventricular ejection fraction by 3D volume is 58 %. The left ventricle has normal function. The left ventricle has no regional wall motion abnormalities. There is mild concentric left ventricular hypertrophy. Left ventricular diastolic parameters are consistent with Grade I diastolic dysfunction (impaired relaxation).  2. Right ventricular systolic function is normal. The right ventricular size is normal.  3. The mitral valve is normal in structure. Trivial mitral valve regurgitation. No evidence of mitral stenosis.  4. The aortic valve is tricuspid. There is moderate calcification of the aortic valve. Aortic valve regurgitation is not visualized. No aortic stenosis is present.  5. The inferior vena cava is normal in size with greater than 50% respiratory variability, suggesting right atrial pressure of 3 mmHg. FINDINGS  Left Ventricle: Left ventricular ejection fraction, by estimation, is 60 to 65%. Left ventricular ejection fraction by 3D volume is 58 %. The left ventricle has normal function. The left ventricle has no regional wall motion abnormalities. The left ventricular internal cavity size was normal in size. There is mild concentric left ventricular hypertrophy. Left ventricular diastolic parameters are consistent with Grade I diastolic dysfunction (impaired relaxation). Right Ventricle: The right ventricular size is normal. No increase in right ventricular wall thickness. Right ventricular  systolic function is normal. Left Atrium: Left atrial size was normal in size. Right Atrium: Right atrial size was normal in size. Pericardium: There is no evidence of pericardial effusion. Mitral Valve: The mitral valve is normal in structure. Trivial mitral valve regurgitation. No evidence of mitral valve stenosis. Tricuspid Valve: The tricuspid valve is normal in structure. Tricuspid valve regurgitation is  trivial. No evidence of tricuspid stenosis. Aortic Valve: The aortic valve is tricuspid. There is moderate calcification of the aortic valve. Aortic valve regurgitation is not visualized. No aortic stenosis is present. Pulmonic Valve: The pulmonic valve was not well visualized. Pulmonic valve regurgitation is not visualized. No evidence of pulmonic stenosis. Aorta: The aortic root is normal in size and structure. Venous: The inferior vena cava is normal in size with greater than 50% respiratory variability, suggesting right atrial pressure of 3 mmHg. IAS/Shunts: No atrial level shunt detected by color flow Doppler. Additional Comments: 3D was performed not requiring image post processing on an independent workstation and was normal.  LEFT VENTRICLE PLAX 2D LVIDd:         4.10 cm         Diastology LVIDs:         2.90 cm         LV e' medial:    6.42 cm/s LV PW:         1.10 cm         LV E/e' medial:  9.2 LV IVS:        1.10 cm         LV e' lateral:   6.09 cm/s LVOT diam:     1.94 cm         LV E/e' lateral: 9.7 LV SV:         58 LV SV Index:   34 LVOT Area:     2.96 cm        3D Volume EF                                LV 3D EF:    Left                                             ventricul                                             ar                                             ejection                                             fraction                                             by 3D                                             volume is  58 %.                                  3D Volume EF:                                3D EF:        58 %                                LV EDV:       137 ml                                LV ESV:       58 ml                                LV SV:        79 ml RIGHT VENTRICLE             IVC RV Basal diam:  2.39 cm     IVC diam: 1.41 cm RV Mid diam:    1.88 cm RV S prime:     10.00 cm/s TAPSE (M-mode): 1.9 cm LEFT ATRIUM             Index        RIGHT ATRIUM          Index LA diam:        2.56 cm 1.48 cm/m   RA Area:     8.89 cm LA Vol (A2C):   35.4 ml 20.52 ml/m  RA Volume:   15.50 ml 8.99 ml/m LA Vol (A4C):   28.6 ml 16.58 ml/m LA Biplane Vol: 34.5 ml 20.00 ml/m  AORTIC VALVE LVOT Vmax:   101.00 cm/s LVOT Vmean:  62.900 cm/s LVOT VTI:    0.196 m  AORTA Ao Root diam: 2.88 cm Ao Asc diam:  2.99 cm MITRAL VALVE MV Area (PHT): 2.43 cm    SHUNTS MV Decel Time: 312 msec    Systemic VTI:  0.20 m MV E velocity: 59.20 cm/s  Systemic Diam: 1.94 cm MV A velocity: 82.30 cm/s MV E/A ratio:  0.72 Toribio Fuel MD Electronically signed by Toribio Fuel MD Signature Date/Time: 02/01/2024/8:51:25 PM    Final    MR Brain W and Wo Contrast Result Date: 01/28/2024 EXAM: MRI BRAIN WITH AND WITHOUT CONTRAST 01/28/2024 10:43:03 PM TECHNIQUE: Multiplanar multisequence MRI of the head/brain was performed with and without the administration of intravenous contrast. COMPARISON: None available. CLINICAL HISTORY: Headache, neuro deficit; Fever, stroke FINDINGS: BRAIN AND VENTRICLES: No acute infarct. No acute intracranial hemorrhage. No mass effect or midline shift. No hydrocephalus. Similar postsurgical changes in the region of the right cerebellopontine angle with similar extra-axial fluid collection causing mild mass effect in the right cerebellum. Similar mild linear enhancement in the right internal auditory canal and pars acusticus. No masslike enhancement. Normal flow voids. ORBITS: No acute abnormality. SINUSES: No acute abnormality. BONES AND  SOFT TISSUES: Normal bone marrow signal and enhancement. No acute soft tissue abnormality. IMPRESSION: 1. No acute intracranial abnormality. 2. Similar right cerebellopontine angle postoperative changes as detailed above. Electronically signed by: Gilmore Molt MD 01/28/2024 11:11 PM  EST RP Workstation: [id number]   CT ANGIO HEAD NECK W WO CM Result Date: 01/28/2024 EXAM: CTA HEAD AND NECK WITH AND WITHOUT 01/28/2024 07:23:00 PM TECHNIQUE: CTA of the head and neck was performed with and without the administration of intravenous contrast. Multiplanar 2D and/or 3D reformatted images are provided for review. Automated exposure control, iterative reconstruction, and/or weight based adjustment of the mA/kV was utilized to reduce the radiation dose to as low as reasonably achievable. Stenosis of the internal carotid arteries measured using NASCET criteria. COMPARISON: None available CLINICAL HISTORY: Neuro deficit, acute, stroke suspected FINDINGS: AORTIC ARCH AND ARCH VESSELS: No dissection or arterial injury. No significant stenosis of the brachiocephalic or subclavian arteries. CERVICAL CAROTID ARTERIES: No dissection, arterial injury, or hemodynamically significant stenosis by NASCET criteria. CERVICAL VERTEBRAL ARTERIES: No dissection, arterial injury, or significant stenosis. LUNGS AND MEDIASTINUM: Unremarkable. SOFT TISSUES: No acute abnormality. BONES: No acute abnormality. ANTERIOR CIRCULATION: No significant stenosis of the internal carotid arteries. No significant stenosis of the anterior cerebral arteries. No significant stenosis of the middle cerebral arteries. Approximately 3-4 mm basilar tip aneurysm. POSTERIOR CIRCULATION: No significant stenosis of the posterior cerebral arteries. No significant stenosis of the basilar artery. No significant stenosis of the vertebral arteries. No aneurysm. OTHER: No dural venous sinus thrombosis on this non-dedicated study. IMPRESSION: 1. Approximately 3-4 mm  basilar tip aneurysm. 2. No large vessel occlusion or hemodynamically significant stenosis. Electronically signed by: Gilmore Molt MD 01/28/2024 07:46 PM EST RP Workstation: [id number]   CT CHEST ABDOMEN PELVIS W CONTRAST Result Date: 01/28/2024 CLINICAL DATA:  Sepsis, neurologic deficit EXAM: CT CHEST, ABDOMEN, AND PELVIS WITH CONTRAST TECHNIQUE: Multidetector CT imaging of the chest, abdomen and pelvis was performed following the standard protocol during bolus administration of intravenous contrast. RADIATION DOSE REDUCTION: This exam was performed according to the departmental dose-optimization program which includes automated exposure control, adjustment of the mA and/or kV according to patient size and/or use of iterative reconstruction technique. CONTRAST:  75mL OMNIPAQUE  IOHEXOL  350 MG/ML SOLN COMPARISON:  08/10/2023 FINDINGS: CT CHEST FINDINGS Cardiovascular: The heart is unremarkable without pericardial effusion. No evidence of thoracic aortic aneurysm or dissection. Mediastinum/Nodes: Stable mediastinal lymphadenopathy, with largest lymph node in the pretracheal region measuring 12 mm in short axis. Thyroid , trachea, and esophagus are unremarkable. Lungs/Pleura: Bilateral areas of cavitating pneumonia are again identified, most pronounced in the left lower lobe. Overall, there has been mild progression since prior study. The areas of lung consolidation demonstrate low attenuation, compatible with findings of lipoid pneumonia noted on recent bronchoscopy 09/06/2023. Trace right pleural effusion. No pneumothorax. Musculoskeletal: No acute or destructive bony abnormalities. Reconstructed images demonstrate no additional findings. CT ABDOMEN PELVIS FINDINGS Hepatobiliary: No focal liver abnormality is seen. No gallstones, gallbladder wall thickening, or biliary dilatation. Pancreas: Unremarkable. No pancreatic ductal dilatation or surrounding inflammatory changes. Spleen: Normal in size without focal  abnormality. Adrenals/Urinary Tract: Simple appearing right renal cortical cyst does not require specific imaging follow-up. Otherwise the kidneys are unremarkable with no urinary tract calculi or obstructive uropathy. The adrenals and bladder are grossly normal. Stomach/Bowel: No bowel obstruction or ileus. No bowel wall thickening or inflammatory change. Vascular/Lymphatic: Aortic atherosclerosis. No enlarged abdominal or pelvic lymph nodes. Reproductive: Prostate is unremarkable. Other: No free fluid or free intraperitoneal gas. No abdominal wall hernia. Musculoskeletal: No acute or destructive bony abnormalities. Reconstructed images demonstrate no additional findings. IMPRESSION: 1. Low-attenuation consolidation within the bilateral lungs with superimposed areas of cavitation, compatible with known history of the lipoid pneumonia. Slight progression  since prior study. 2. Stable mediastinal lymphadenopathy. 3. Trace right pleural effusion. 4. No acute intra-abdominal or intrapelvic process. 5.  Aortic Atherosclerosis (ICD10-I70.0). Electronically Signed   By: Ozell Daring M.D.   On: 01/28/2024 19:44   CT Head Wo Contrast Result Date: 01/28/2024 CLINICAL DATA:  Altered level of consciousness, confusion EXAM: CT HEAD WITHOUT CONTRAST TECHNIQUE: Contiguous axial images were obtained from the base of the skull through the vertex without intravenous contrast. RADIATION DOSE REDUCTION: This exam was performed according to the departmental dose-optimization program which includes automated exposure control, adjustment of the mA and/or kV according to patient size and/or use of iterative reconstruction technique. COMPARISON:  09/08/2018 FINDINGS: Brain: There is hypodensity within the inferior right occipital cortex, reference image 11/3 and image 12/5, consistent with age-indeterminate infarct. No evidence of acute hemorrhage. Lateral ventricles and midline structures are unremarkable. No acute extra-axial fluid  collections. No mass effect. Vascular: No hyperdense vessel or unexpected calcification. Skull: Postsurgical changes from prior right occipital craniectomy. The remainder of the calvarium is unremarkable. Sinuses/Orbits: Mild mucosal thickening within the left sphenoid sinus. Remaining paranasal sinuses are clear. Other: None. IMPRESSION: 1. Cortical hypodensity within the inferior right occipital lobe, which could reflect age indeterminate cortical infarct. Further evaluation with MRI may be useful. 2. No evidence of acute hemorrhage. 3. Postsurgical changes from prior right-sided acoustic neuroma resection. These results were called by telephone at the time of interpretation on 01/28/2024 at 5:03 pm to provider [facility] , who verbally acknowledged these results. Electronically Signed   By: Ozell Daring M.D.   On: 01/28/2024 17:21   DG Chest 2 View if patient is not in a treatment room. Result Date: 01/28/2024 CLINICAL DATA:  Possible sepsis.  Confusion. EXAM: CHEST - 2 VIEW COMPARISON:  09/06/2023, 02/28/2023 and chest CT 08/02/2023 FINDINGS: Lungs are adequately inflated demonstrate multifocal airspace density over the left mid to lower lung and right base as similar findings are seen on previous exams. Patient underwent bronchoscopy June 2025 with biopsy demonstrating lipoid pneumonia. No significant effusion or pneumothorax. Cardiomediastinal silhouette and remainder of the exam is unchanged. IMPRESSION: Multifocal airspace process over the left mid to lower lung and right base as similar findings are seen on previous exams. Previous bronchoscopy with biopsy June 2025 demonstrates lipoid pneumonia. Electronically Signed   By: Toribio Agreste M.D.   On: 01/28/2024 16:33     Subjective: - no chest pain, shortness of breath, no abdominal pain, nausea or vomiting.   Discharge Exam: BP (!) 141/91 (BP Location: Right Arm)   Pulse 76   Temp 97.8 F (36.6 C) (Oral)   Resp 18   Ht 5' 7 (1.702 m)    Wt 64.8 kg   SpO2 98%   BMI 22.37 kg/m   General: Pt is alert, awake, not in acute distress Cardiovascular: RRR, S1/S2 +, no rubs, no gallops Respiratory: CTA bilaterally, no wheezing, no rhonchi Abdominal: Soft, NT, ND, bowel sounds + Extremities: no edema, no cyanosis    The results of significant diagnostics from this hospitalization (including imaging, microbiology, ancillary and laboratory) are listed below for reference.     Microbiology: Recent Results (from the past 240 hours)  Resp panel by RT-PCR (RSV, Flu A&B, Covid) Anterior Nasal Swab     Status: None   Collection Time: 01/28/24  3:56 PM   Specimen: Anterior Nasal Swab  Result Value Ref Range Status   SARS Coronavirus 2 by RT PCR NEGATIVE NEGATIVE Final   Influenza A by  PCR NEGATIVE NEGATIVE Final   Influenza B by PCR NEGATIVE NEGATIVE Final    Comment: (NOTE) The Xpert Xpress SARS-CoV-2/FLU/RSV plus assay is intended as an aid in the diagnosis of influenza from Nasopharyngeal swab specimens and should not be used as a sole basis for treatment. Nasal washings and aspirates are unacceptable for Xpert Xpress SARS-CoV-2/FLU/RSV testing.  Fact Sheet for Patients: bloggercourse.com  Fact Sheet for Healthcare Providers: seriousbroker.it  This test is not yet approved or cleared by the [place]  FDA and has been authorized for detection and/or diagnosis of SARS-CoV-2 by FDA under an Emergency Use Authorization (EUA). This EUA will remain in effect (meaning this test can be used) for the duration of the COVID-19 declaration under Section 564(b)(1) of the Act, 21 U.S.C. section 360bbb-3(b)(1), unless the authorization is terminated or revoked.     Resp Syncytial Virus by PCR NEGATIVE NEGATIVE Final    Comment: (NOTE) Fact Sheet for Patients: bloggercourse.com  Fact Sheet for Healthcare  Providers: seriousbroker.it  This test is not yet approved or cleared by the [place]  FDA and has been authorized for detection and/or diagnosis of SARS-CoV-2 by FDA under an Emergency Use Authorization (EUA). This EUA will remain in effect (meaning this test can be used) for the duration of the COVID-19 declaration under Section 564(b)(1) of the Act, 21 U.S.C. section 360bbb-3(b)(1), unless the authorization is terminated or revoked.  Performed at [facility], [address], [place], [place] [address]   Culture, blood (Routine x 2)     Status: None   Collection Time: 01/28/24  5:20 PM   Specimen: BLOOD LEFT ARM  Result Value Ref Range Status   Specimen Description BLOOD LEFT ARM  Final   Special Requests   Final    BOTTLES DRAWN AEROBIC AND ANAEROBIC Blood Culture adequate volume   Culture   Final    NO GROWTH 5 DAYS Performed at [facility], [address] [address] [place], [place] [address]    Report Status 02/02/2024 FINAL  Final  Culture, blood (Routine x 2)     Status: None   Collection Time: 01/28/24  5:52 PM   Specimen: BLOOD LEFT ARM  Result Value Ref Range Status   Specimen Description BLOOD LEFT ARM  Final   Special Requests   Final    BOTTLES DRAWN AEROBIC ONLY Blood Culture results may not be optimal due to an inadequate volume of blood received in culture bottles   Culture   Final    NO GROWTH 5 DAYS Performed at [facility], [address], [place], [place] [address]    Report Status 02/02/2024 FINAL  Final  Respiratory (~20 pathogens) panel by PCR     Status: None   Collection Time: 01/28/24  8:17 PM   Specimen: Nasopharyngeal Swab; Respiratory  Result Value Ref Range Status   Adenovirus NOT DETECTED NOT DETECTED Final   Coronavirus 229E NOT DETECTED NOT DETECTED Final    Comment: (NOTE) The Coronavirus on the Respiratory Panel, DOES NOT test for the novel  Coronavirus (2019 nCoV)    Coronavirus HKU1 NOT  DETECTED NOT DETECTED Final   Coronavirus NL63 NOT DETECTED NOT DETECTED Final   Coronavirus OC43 NOT DETECTED NOT DETECTED Final   Metapneumovirus NOT DETECTED NOT DETECTED Final   Rhinovirus / Enterovirus NOT DETECTED NOT DETECTED Final   Influenza A NOT DETECTED NOT DETECTED Final   Influenza B NOT DETECTED NOT DETECTED Final   Parainfluenza Virus 1 NOT DETECTED  NOT DETECTED Final   Parainfluenza Virus 2 NOT DETECTED NOT DETECTED Final   Parainfluenza Virus 3 NOT DETECTED NOT DETECTED Final   Parainfluenza Virus 4 NOT DETECTED NOT DETECTED Final   Respiratory Syncytial Virus NOT DETECTED NOT DETECTED Final   Bordetella pertussis NOT DETECTED NOT DETECTED Final   Bordetella Parapertussis NOT DETECTED NOT DETECTED Final   Chlamydophila pneumoniae NOT DETECTED NOT DETECTED Final   Mycoplasma pneumoniae NOT DETECTED NOT DETECTED Final    Comment: Performed at [facility], [address] [place], [place] [address]  Urine Culture (for pregnant, neutropenic or urologic patients or patients with an indwelling urinary catheter)     Status: None   Collection Time: 01/28/24 10:10 PM   Specimen: Urine, Clean Catch  Result Value Ref Range Status   Specimen Description URINE, CLEAN CATCH  Final   Special Requests NONE  Final   Culture   Final    NO GROWTH Performed at [facility], [address] [place], [place] [address]    Report Status 01/30/2024 FINAL  Final  MRSA Next Gen by PCR, Nasal     Status: Abnormal   Collection Time: 01/31/24  8:12 AM   Specimen: Nasal Mucosa; Nasal Swab  Result Value Ref Range Status   MRSA by PCR Next Gen DETECTED (A) NOT DETECTED Final    Comment: RESULT CALLED TO, READ BACK BY AND VERIFIED WITH: RN J.NARAMDAS AT 1254 ON 01/31/2024 BY T.SAAD. (NOTE) The GeneXpert MRSA Assay (FDA approved for NASAL specimens only), is one component of a comprehensive MRSA colonization surveillance program. It is not intended to diagnose MRSA infection nor to  guide or monitor treatment for MRSA infections. Test performance is not FDA approved in patients less than 48 years old. Performed at [facility], [address], [place], [place] [address]      Labs: Basic Metabolic Panel: Recent Labs  Lab 01/30/24 0425 01/30/24 0426 02/01/24 0401 02/02/24 0242 02/05/24 0113  NA  --  135 132* 132* 134*  K  --  4.1 3.7 3.5 4.0  CL  --  101 93* 94* 94*  CO2  --  24 26 26 27   GLUCOSE  --  99 114* 113* 109*  BUN  --  15 10 12 15   CREATININE  --  1.21 0.98 1.08 0.81  CALCIUM   --  8.1* 8.2* 8.2* 8.3*  MG 2.0  --   --  1.9 2.2  PHOS  --  2.4* 3.2  --   --    Liver Function Tests: Recent Labs  Lab 01/30/24 0426 02/02/24 0242  AST  --  12*  ALT  --  10  ALKPHOS  --  57  BILITOT  --  0.6  PROT  --  6.2*  ALBUMIN 2.4* 2.3*   CBC: Recent Labs  Lab 01/30/24 0425 02/01/24 0401 02/02/24 0242 02/05/24 0113  WBC 9.5 9.9 11.2* 13.1*  NEUTROABS 8.1*  --   --   --   HGB 9.5* 11.1* 11.3* 10.5*  HCT 28.9* 33.8* 34.5* 32.6*  MCV 79.8* 78.6* 78.2* 78.4*  PLT 163 220 249 397   CBG: Recent Labs  Lab 01/31/24 2232  GLUCAP 117*   Hgb A1c No results for input(s): HGBA1C in the last 72 hours. Lipid Profile No results for input(s): CHOL, HDL, LDLCALC, TRIG, CHOLHDL, LDLDIRECT in the last 72 hours. Thyroid  function studies No results for input(s): TSH, T4TOTAL, T3FREE, THYROIDAB in the last 72 hours.  Invalid input(s): FREET3 Urinalysis    Component Value Date/Time   COLORURINE YELLOW 01/28/2024 1556   APPEARANCEUR HAZY (A) 01/28/2024 1556   LABSPEC 1.025 01/28/2024 1556   LABSPEC 1.020 10/05/2017 0956   PHURINE 5.0 01/28/2024 1556   GLUCOSEU NEGATIVE 01/28/2024 1556   HGBUR MODERATE (A) 01/28/2024 1556   BILIRUBINUR NEGATIVE 01/28/2024 1556   BILIRUBINUR negative 08/05/2022 0949   BILIRUBINUR NEG 07/03/2013 1015   KETONESUR NEGATIVE 01/28/2024 1556   PROTEINUR 100 (A) 01/28/2024 1556   UROBILINOGEN  0.2 08/05/2022 0949   NITRITE NEGATIVE 01/28/2024 1556   LEUKOCYTESUR NEGATIVE 01/28/2024 1556    FURTHER DISCHARGE INSTRUCTIONS:   Get Medicines reviewed and adjusted: Please take all your medications with you for your next visit with your Primary MD   Laboratory/radiological data: Please request your Primary MD to go over all hospital tests and procedure/radiological results at the follow up, please ask your Primary MD to get all Hospital records sent to his/her office.   In some cases, they will be blood work, cultures and biopsy results pending at the time of your discharge. Please request that your primary care M.D. goes through all the records of your hospital data and follows up on these results.   Also Note the following: If you experience worsening of your admission symptoms, develop shortness of breath, life threatening emergency, suicidal or homicidal thoughts you must seek medical attention immediately by calling 911 or calling your MD immediately  if symptoms less severe.   You must read complete instructions/literature along with all the possible adverse reactions/side effects for all the Medicines you take and that have been prescribed to you. Take any new Medicines after you have completely understood and accpet all the possible adverse reactions/side effects.    Do not drive when taking Pain medications or sleeping medications (Benzodaizepines)   Do not take more than prescribed Pain, Sleep and Anxiety Medications. It is not advisable to combine anxiety,sleep and pain medications without talking with your primary care practitioner   Special Instructions: If you have smoked or chewed Tobacco  in the last 2 yrs please stop smoking, stop any regular Alcohol  and or any Recreational drug use.   Wear Seat belts while driving.   Please note: You were cared for by a hospitalist during your hospital stay. Once you are discharged, your primary care physician will handle any  further medical issues. Please note that NO REFILLS for any discharge medications will be authorized once you are discharged, as it is imperative that you return to your primary care physician (or establish a relationship with a primary care physician if you do not have one) for your post hospital discharge needs so that they can reassess your need for medications and monitor your lab values.  Time coordinating discharge: 35 minutes  SIGNED:  Nilda Fendt, MD, PhD 02/05/2024, 10:06 AM

## 2024-02-06 ENCOUNTER — Other Ambulatory Visit: Payer: Self-pay | Admitting: Medical

## 2024-02-06 NOTE — Telephone Encounter (Signed)
 Last appt. 11/21/23.

## 2024-02-09 ENCOUNTER — Other Ambulatory Visit: Payer: Self-pay | Admitting: Medical

## 2024-02-22 NOTE — Progress Notes (Incomplete)
 Logan Phillips    [id number]    1950/11/22  Primary Care Physician:Tysinger, Alm RAMAN, PA-C Date of Appointment: 02/22/2024 Established Patient Visit  Chief complaint:   No chief complaint on file.    HPI: Logan Phillips is a 73 y.o. man with alcohol use disorder, opioid use disorder including snorting pills,  who presented with cavitary pneumonia in April 2025. Findings have been present since 2023 on his CT Chest.    Interval Updates: Here after follow up CT Chest. He had empiric antibiotics with augmentin  for one month. Completed the course.   Repeat CT Chest shows ongoing cavitation with reports concerning for malignancy.   No dyspnea. No cough, hemoptysis. No respiratory symptoms at all. No fevers chills night sweats or weight loss.   No urinary symptoms or sinus disease.   I have reviewed the patient's family social and past medical history and updated as appropriate.   Past Medical History:  Diagnosis Date   Chronic pain in shoulder 2018   [facility]   H/O echocardiogram 10/08/2016   EF 65-70%, normal wall motion, systolic function vigorous   Hearing loss    pending hearing aids 06/2014   Hyperlipidemia    Hypertension    Lipoma    Pneumonia    Varicose vein    Vestibular schwannoma (HCC) 04/2018    Past Surgical History:  Procedure Laterality Date   APPENDECTOMY     APPLICATION OF CRANIAL NAVIGATION Right 05/09/2018   Procedure: APPLICATION OF CRANIAL NAVIGATION;  Surgeon: Lanis Pupa, MD;  Location: [facility] OR;  Service: Neurosurgery;  Laterality: Right;   BIOPSY  04/12/2018   Procedure: BIOPSY;  Surgeon: Kristie Lamprey, MD;  Location: [facility] ENDOSCOPY;  Service: Endoscopy;;   COLONOSCOPY     age 34, he did not f/u with referral 2018   COLONOSCOPY WITH PROPOFOL  N/A 04/12/2018   Procedure: COLONOSCOPY WITH PROPOFOL ;  Surgeon: Kristie Lamprey, MD;  Location: [facility] ENDOSCOPY;  Service: Endoscopy;  Laterality: N/A;   CRANIOTOMY Right 05/09/2018    Procedure: Right retrosigmoid craniectomy, resection of acoustic neuroma with intraoperative facial monitoring/Brain Lab;  Surgeon: Lanis Pupa, MD;  Location: [facility] OR;  Service: Neurosurgery;  Laterality: Right;   LUMBAR DISC SURGERY  2002   Dr. Mora   VIDEO BRONCHOSCOPY N/A 09/06/2023   Procedure: BRONCHOSCOPY, WITH FLUOROSCOPY;  Surgeon: Meade Verdon RAMAN, MD;  Location: [facility] ENDOSCOPY;  Service: Pulmonary;  Laterality: N/A;   WISDOM TOOTH EXTRACTION      Family History  Problem Relation Age of Onset   Hypertension Mother    Dementia Father    Parkinsonism Father    Diabetes Father        early stages   Cancer Maternal Grandmother        breast   Heart disease Neg Hx    Stroke Neg Hx     Social History   Occupational History   Not on file  Tobacco Use   Smoking status: Never    Passive exposure: Past   Smokeless tobacco: Never  Vaping Use   Vaping status: Never Used  Substance and Sexual Activity   Alcohol use: Yes    Alcohol/week: 7.0 standard drinks of alcohol    Types: 7 Shots of liquor per week    Comment: heavier prior   Drug use: No   Sexual activity: Not on file     Physical Exam: There were no vitals taken for this visit.  Gen:  No acute distress ENT:  no nasal polyps, mucus membranes moist Lungs:    No increased respiratory effort, symmetric chest wall excursion, clear to auscultation bilaterally, no wheezes or crackles CV:         Regular rate and rhythm; no murmurs, rubs, or gallops.  No pedal edema   Data Reviewed: Imaging: I have personally reviewed the CT Chest from May 2025 which shows ongoing bilateral lower lobe consolidations with evolving cavitary lesion in the left lung.   PFTs:  Labs: Lab Results  Component Value Date   NA 134 (L) 02/05/2024   K 4.0 02/05/2024   CO2 27 02/05/2024   GLUCOSE 109 (H) 02/05/2024   BUN 15 02/05/2024   CREATININE 0.81 02/05/2024   CALCIUM  8.3 (L) 02/05/2024   EGFR 71 11/21/2023   GFRNONAA  >60 02/05/2024   Lab Results  Component Value Date   WBC 13.1 (H) 02/05/2024   HGB 10.5 (L) 02/05/2024   HCT 32.6 (L) 02/05/2024   MCV 78.4 (L) 02/05/2024   PLT 397 02/05/2024    Immunization status: Immunization History  Administered Date(s) Administered   Pneumococcal Conjugate-13 10/04/2016   Pneumococcal Polysaccharide-23 10/05/2017   Tdap 07/08/2014   Zoster Recombinant(Shingrix ) 03/21/2017, 06/14/2017    External Records Personally Reviewed: pulmonary  09/06/23 LLL biopsy TBBC> alveolar spaces with lipoid vacuoles and consistent with lipoid pneumonia. Neg for vasculitis and malignancy. AFB stain highlights AFB positive organisms most consistent with mycobacteria. GMS stain is negative.  Stain controls worked appropriatel  BAL negative.  Assessment:  Non resolving cavitary pneumonia Alcohol Use disorder Shortness of breath  Plan/Recommendations:  Symptoms did not improve with prolonged antibiotics. Differential includes granulomatosis with polyangitis, malignancy.  He also continues to snort pain pills. Denies IV drugs. CT findings have been present since 2023 which makes malignancy seem less likely.   Because your pneumonia is not resolving, I am recommending a bronchoscopy to investigate further.   We will try to arrange this for next week.   Before the procedure no eating at midnight the night before. Ok to take your medications that morning with a small sip of water.  Will plan for bronchoscopy with BAL, transbronchial biopsy and endobronchial brushings  Patient requires albuterol  nebulizer and nebulizer machine for ongoing shortness of breath.   Reviewed lipoid pneumonia diagnosis - he is going to stop drinking mineral oil daily. Also reviewed cytology positive for AFB - again he is not symptomatic and ct findings not as consistent with MAI infection so interpreting this as colonization vs infection for now.   Return to Care:  Need sputum x2  No  follow-ups on file.   Verdon Gore, MD Pulmonary and Critical Care Medicine [facility] Office:[phone]

## 2024-02-23 ENCOUNTER — Encounter

## 2024-03-02 ENCOUNTER — Ambulatory Visit

## 2024-03-02 VITALS — BP 142/80 | HR 98 | Temp 97.6°F | Ht 67.0 in | Wt 128.0 lb

## 2024-03-02 DIAGNOSIS — J984 Other disorders of lung: Secondary | ICD-10-CM

## 2024-03-02 DIAGNOSIS — F1090 Alcohol use, unspecified, uncomplicated: Secondary | ICD-10-CM

## 2024-03-02 DIAGNOSIS — J691 Pneumonitis due to inhalation of oils and essences: Secondary | ICD-10-CM

## 2024-03-02 DIAGNOSIS — T520X4A Toxic effect of petroleum products, undetermined, initial encounter: Secondary | ICD-10-CM | POA: Diagnosis not present

## 2024-03-02 DIAGNOSIS — R0602 Shortness of breath: Secondary | ICD-10-CM

## 2024-03-02 DIAGNOSIS — Z09 Encounter for follow-up examination after completed treatment for conditions other than malignant neoplasm: Secondary | ICD-10-CM

## 2024-03-02 MED ORDER — BUDESONIDE-FORMOTEROL FUMARATE 160-4.5 MCG/ACT IN AERO: 2.0000 | INHALATION_SPRAY | Freq: Two times a day (BID) | RESPIRATORY_TRACT | 6 refills | Status: AC

## 2024-03-02 MED ORDER — ALBUTEROL SULFATE HFA 108 (90 BASE) MCG/ACT IN AERS: 2.0000 | INHALATION_SPRAY | Freq: Four times a day (QID) | RESPIRATORY_TRACT | 6 refills | Status: AC | PRN

## 2024-03-02 MED ORDER — ALBUTEROL SULFATE (2.5 MG/3ML) 0.083% IN NEBU: 2.5000 mg | INHALATION_SOLUTION | Freq: Four times a day (QID) | RESPIRATORY_TRACT | 12 refills | Status: AC | PRN

## 2024-03-02 NOTE — Progress Notes (Unsigned)
 "              RIAN Phillips    [id number]    04-28-1950  Primary Care Physician:Tysinger, Alm RAMAN, PA-C Date of Appointment: 03/02/2024 Established Patient Visit  Chief complaint:   Chief Complaint  Patient presents with   Consult   Shortness of Breath    When walking and at rest     HPI: Logan Phillips is a 73 y.o. man with alcohol use disorder, opioid use disorder including snorting pills,  who presented with cavitary pneumonia in April 2025. Findings have been present since 2023 on his CT Chest.    Interval Updates: Here after follow up CT Chest. He had empiric antibiotics with augmentin  for one month. Completed the course.   Repeat CT Chest shows ongoing cavitation with reports concerning for malignancy.   No dyspnea. No cough, hemoptysis. No respiratory symptoms at all. No fevers chills night sweats or weight loss.   No urinary symptoms or sinus disease.   I have reviewed the patient's family social and past medical history and updated as appropriate.   Past Medical History:  Diagnosis Date   Chronic pain in shoulder 2018   [facility]   H/O echocardiogram 10/08/2016   EF 65-70%, normal wall motion, systolic function vigorous   Hearing loss    pending hearing aids 06/2014   Hyperlipidemia    Hypertension    Lipoma    Pneumonia    Varicose vein    Vestibular schwannoma (HCC) 04/2018    Past Surgical History:  Procedure Laterality Date   APPENDECTOMY     APPLICATION OF CRANIAL NAVIGATION Right 05/09/2018   Procedure: APPLICATION OF CRANIAL NAVIGATION;  Surgeon: Lanis Pupa, MD;  Location: [facility] OR;  Service: Neurosurgery;  Laterality: Right;   BIOPSY  04/12/2018   Procedure: BIOPSY;  Surgeon: Kristie Lamprey, MD;  Location: [facility] ENDOSCOPY;  Service: Endoscopy;;   COLONOSCOPY     age 69, he did not f/u with referral 2018   COLONOSCOPY WITH PROPOFOL  N/A 04/12/2018   Procedure: COLONOSCOPY WITH PROPOFOL ;  Surgeon: Kristie Lamprey, MD;  Location[facility]  ENDOSCOPY;  Service: Endoscopy;  Laterality: N/A;   CRANIOTOMY Right 05/09/2018   Procedure: Right retrosigmoid craniectomy, resection of acoustic neuroma with intraoperative facial monitoring/Brain Lab;  Surgeon: Lanis Pupa, MD;  Location[facility] OR;  Service: Neurosurgery;  Laterality: Right;   LUMBAR DISC SURGERY  2002   Dr. Mora   VIDEO BRONCHOSCOPY N/A 09/06/2023   Procedure: BRONCHOSCOPY, WITH FLUOROSCOPY;  Surgeon: Meade Verdon RAMAN, MD;  Location: [facility] ENDOSCOPY;  Service: Pulmonary;  Laterality: N/A;   WISDOM TOOTH EXTRACTION      Family History  Problem Relation Age of Onset   Hypertension Mother    Dementia Father    Parkinsonism Father    Diabetes Father        early stages   Cancer Maternal Grandmother        breast   Heart disease Neg Hx    Stroke Neg Hx     Social History   Occupational History   Not on file  Tobacco Use   Smoking status: Never    Passive exposure: Past   Smokeless tobacco: Never  Vaping Use   Vaping status: Never Used  Substance and Sexual Activity   Alcohol use: Yes    Alcohol/week: 7.0 standard drinks of alcohol    Types: 7 Shots of liquor per week    Comment: heavier prior   Drug use:  No   Sexual activity: Not on file     Physical Exam: Blood pressure (!) 142/80, pulse 98, temperature 97.6 F (36.4 C), temperature source Oral, height 5' 7 (1.702 m), weight 128 lb (58.1 kg), SpO2 97%.  Gen:      No acute distress ENT:  no nasal polyps, mucus membranes moist Lungs:    No increased respiratory effort, symmetric chest wall excursion, clear to auscultation bilaterally, no wheezes or crackles CV:         Regular rate and rhythm; no murmurs, rubs, or gallops.  No pedal edema   Data Reviewed: Imaging: I have personally reviewed the CT Chest from May 2025 which shows ongoing bilateral lower lobe consolidations with evolving cavitary lesion in the left lung.   PFTs:  Labs: Lab Results  Component Value Date   NA 134 (L)  02/05/2024   K 4.0 02/05/2024   CO2 27 02/05/2024   GLUCOSE 109 (H) 02/05/2024   BUN 15 02/05/2024   CREATININE 0.81 02/05/2024   CALCIUM  8.3 (L) 02/05/2024   EGFR 71 11/21/2023   GFRNONAA >60 02/05/2024   Lab Results  Component Value Date   WBC 13.1 (H) 02/05/2024   HGB 10.5 (L) 02/05/2024   HCT 32.6 (L) 02/05/2024   MCV 78.4 (L) 02/05/2024   PLT 397 02/05/2024    Immunization status: Immunization History  Administered Date(s) Administered   Pneumococcal Conjugate-13 10/04/2016   Pneumococcal Polysaccharide-23 10/05/2017   Tdap 07/08/2014   Zoster Recombinant(Shingrix ) 03/21/2017, 06/14/2017    External Records Personally Reviewed: pulmonary  09/06/23 LLL biopsy TBBC> alveolar spaces with lipoid vacuoles and consistent with lipoid pneumonia. Neg for vasculitis and malignancy. AFB stain highlights AFB positive organisms most consistent with mycobacteria. GMS stain is negative.  Stain controls worked appropriatel  BAL negative.  Echo 02/01/2024 1. Left ventricular ejection fraction, by estimation, is 60 to 65%. Left  ventricular ejection fraction by 3D volume is 58 %. The left ventricle has  normal function. The left ventricle has no regional wall motion  abnormalities. There is mild concentric  left ventricular hypertrophy. Left ventricular diastolic parameters are  consistent with Grade I diastolic dysfunction (impaired relaxation).   2. Right ventricular systolic function is normal. The right ventricular  size is normal.   3. The mitral valve is normal in structure. Trivial mitral valve  regurgitation. No evidence of mitral stenosis.   4. The aortic valve is tricuspid. There is moderate calcification of the  aortic valve. Aortic valve regurgitation is not visualized. No aortic  stenosis is present.   5. The inferior vena cava is normal in size with greater than 50%  respiratory variability, suggesting right atrial pressure of 3 mmHg.   Echo 01/28/2024 Bilateral  areas of cavitating pneumonia are again identified, most pronounced in the left lower lobe. Overall, there has been mild progression since prior study. The areas of lung consolidation demonstrate low attenuation, compatible with findings of lipoid pneumonia noted on recent bronchoscopy 09/06/2023. Trace right pleural effusion. No pneumothorax.  Assessment:  Non resolving cavitary pneumonia Alcohol Use disorder Shortness of breath  Plan/Recommendations:  Symptoms did not improve with prolonged antibiotics. Differential includes granulomatosis with polyangitis, malignancy.  He also continues to snort pain pills. Denies IV drugs. CT findings have been present since 2023 which makes malignancy seem less likely.   Because your pneumonia is not resolving, I am recommending a bronchoscopy to investigate further.   We will try to arrange this for next week.   Before the  procedure no eating at midnight the night before. Ok to take your medications that morning with a small sip of water.  Will plan for bronchoscopy with BAL, transbronchial biopsy and endobronchial brushings  Patient requires albuterol  nebulizer and nebulizer machine for ongoing shortness of breath.   Reviewed lipoid pneumonia diagnosis - he is going to stop drinking mineral oil daily. Also reviewed cytology positive for AFB - again he is not symptomatic and ct findings not as consistent with MAI infection so interpreting this as colonization vs infection for now.   Return to Care:  Need sputum x2  Return in about 7 weeks (around 04/18/2024).       "

## 2024-03-02 NOTE — Patient Instructions (Signed)
" °  VISIT SUMMARY: You visited us  today due to ongoing respiratory symptoms related to your lipoid pneumonia. We discussed your recent hospitalization, current symptoms, and treatment options moving forward.  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 Your recent hospitalization was due to a flare-up of this condition. -We have ordered a CT scan of your chest towards the end of January/FEB to check for any improvement or need for further intervention. -You have been prescribed an inhaler with a steroid for daily use to help improve your breathing.  Use symbicort 2 puff twice a day. Please remember to rinse your mouth after using the inhaler to prevent thrush. -We have ordered basic labs today -We will have a follow-up appointment after your CT scan in February to discuss the results and next steps.    contains text generated by [employer].   "

## 2024-03-05 ENCOUNTER — Other Ambulatory Visit

## 2024-03-05 DIAGNOSIS — J691 Pneumonitis due to inhalation of oils and essences: Secondary | ICD-10-CM

## 2024-03-05 DIAGNOSIS — J984 Other disorders of lung: Secondary | ICD-10-CM

## 2024-03-05 DIAGNOSIS — R0602 Shortness of breath: Secondary | ICD-10-CM

## 2024-03-06 LAB — IGE: IgE (Immunoglobulin E), Serum: 117 kU/L — ABNORMAL HIGH

## 2024-03-13 ENCOUNTER — Other Ambulatory Visit: Payer: Self-pay | Admitting: Medical

## 2024-03-13 NOTE — Telephone Encounter (Signed)
 Copied from CRM #[id number]. Topic: Clinical - Medication Refill >> Mar 13, 2024 11:04 AM Brittany M wrote: Medication: Testosterone  25 MG/2.5GM (1%) GEL  Has the patient contacted their pharmacy? Yes (Agent: If no, request that the patient contact the pharmacy for the refill. If patient does not wish to contact the pharmacy document the reason why and proceed with request.) (Agent: If yes, when and what did the pharmacy advise?)  This is the patient's preferred pharmacy:  [id number] [address], [place] - [address] AT [facility] [address] [facility] [address] [place] [address] Phone: [phone] Fax: [phone]    Is this the correct pharmacy for this prescription? Yes If no, delete pharmacy and type the correct one.   Has the prescription been filled recently? Yes  Is the patient out of the medication? Yes  Has the patient been seen for an appointment in the last year OR does the patient have an upcoming appointment? Yes  Can we respond through MyChart? Yes  Agent: Please be advised that Rx refills may take up to 3 business days. We ask that you follow-up with your pharmacy.

## 2024-03-27 ENCOUNTER — Telehealth: Payer: Self-pay

## 2024-03-27 NOTE — Telephone Encounter (Signed)
 Copied from CRM [id number]. Topic: Clinical - Medication Question >> Mar 27, 2024  3:01 PM Alfonso HERO wrote: Reason for CRM: patient calling about refill of Testosterone  2 Pump Topical Daily. I informed him that is was refused and he is asking for a call to explain why.

## 2024-03-28 ENCOUNTER — Other Ambulatory Visit: Payer: Self-pay | Admitting: Medical

## 2024-03-28 MED ORDER — TESTOSTERONE 25 MG/2.5GM (1%) TD GEL: 2.0000 | Freq: Every day | TRANSDERMAL | 1 refills | Status: AC

## 2024-03-29 ENCOUNTER — Other Ambulatory Visit: Payer: Self-pay | Admitting: Cardiology

## 2024-03-29 NOTE — Telephone Encounter (Signed)
 Appt made

## 2024-03-30 ENCOUNTER — Telehealth: Payer: Self-pay

## 2024-03-30 MED ORDER — ROSUVASTATIN CALCIUM 10 MG PO TABS: 10.0000 mg | ORAL_TABLET | Freq: Every day | ORAL | 0 refills | Status: AC

## 2024-03-30 NOTE — Telephone Encounter (Signed)
 Lipid Panel done on 11/21/23

## 2024-03-30 NOTE — Telephone Encounter (Signed)
 Copied from CRM [id number]. Topic: Clinical - Prescription Issue >> Mar 30, 2024  1:29 PM Santiya F wrote: Reason for CRM: Patient is calling in because he says since he changed insurances, the medication Testosterone  25 MG/2.5GM (1%) GEL [[id number]] is no longer fully covered and is requiring a $157 copay and he cannot afford that. Patient wants to know if there are any alternative medications he can be prescribed. Please follow up with patient.

## 2024-04-01 ENCOUNTER — Other Ambulatory Visit: Payer: Self-pay | Admitting: Medical

## 2024-04-01 MED ORDER — TESTOSTERONE ENANTHATE 75 MG/0.5ML ~~LOC~~ SOAJ: 75.0000 mg | SUBCUTANEOUS | 2 refills | Status: AC

## 2024-04-11 ENCOUNTER — Other Ambulatory Visit: Payer: Self-pay | Admitting: Medical

## 2024-04-16 ENCOUNTER — Other Ambulatory Visit

## 2024-04-17 ENCOUNTER — Ambulatory Visit: Admission: RE | Admit: 2024-04-17 | Discharge: 2024-04-17 | Disposition: A | Source: Ambulatory Visit

## 2024-04-17 DIAGNOSIS — J984 Other disorders of lung: Secondary | ICD-10-CM

## 2024-04-17 DIAGNOSIS — J691 Pneumonitis due to inhalation of oils and essences: Secondary | ICD-10-CM

## 2024-04-19 ENCOUNTER — Telehealth: Payer: Self-pay | Admitting: Internal Medicine

## 2024-04-19 ENCOUNTER — Other Ambulatory Visit: Payer: Self-pay | Admitting: Medical

## 2024-04-19 MED ORDER — OMEPRAZOLE 40 MG PO CPDR: 40.0000 mg | DELAYED_RELEASE_CAPSULE | Freq: Every day | ORAL | 2 refills | Status: AC

## 2024-04-19 NOTE — Telephone Encounter (Signed)
 I do not see this listed on his chart  Copied from CRM #[id number]. Topic: Clinical - Medication Refill >> Apr 19, 2024 11:01 AM Gustabo D wrote: Medication: Omeprazole  40MG   Has the patient contacted their pharmacy? Yes (Agent: If no, request that the patient contact the pharmacy for the refill. If patient does not wish to contact the pharmacy document the reason why and proceed with request.) (Agent: If yes, when and what did the pharmacy advise?)  This is the patient's preferred pharmacy:  [id number] [address], [place] - [address] AT [facility] [address] [facility] [address] [place] [address] Phone: [phone] Fax: [phone]       Is this the correct pharmacy for this prescription? Yes If no, delete pharmacy and type the correct one.   Has the prescription been filled recently? No  Is the patient out of the medication? No  Has the patient been seen for an appointment in the last year OR does the patient have an upcoming appointment? Yes  Can we respond through MyChart? No  Agent: Please be advised that Rx refills may take up to 3 business days. We ask that you follow-up with your pharmacy.

## 2024-04-23 ENCOUNTER — Ambulatory Visit

## 2024-06-04 ENCOUNTER — Ambulatory Visit: Admitting: Medical

## 2024-11-27 ENCOUNTER — Encounter: Payer: Self-pay | Admitting: Medical
